# Patient Record
Sex: Female | Born: 1940 | Race: White | Hispanic: No | Marital: Married | Smoking: Former smoker
Health system: Southern US, Community
[De-identification: ages and names within clinical notes are randomized; demographics above are authoritative.]

## PROBLEM LIST (undated history)

## (undated) DIAGNOSIS — E039 Hypothyroidism, unspecified: Secondary | ICD-10-CM

## (undated) DIAGNOSIS — I350 Nonrheumatic aortic (valve) stenosis: Secondary | ICD-10-CM

## (undated) DIAGNOSIS — M1990 Unspecified osteoarthritis, unspecified site: Secondary | ICD-10-CM

## (undated) DIAGNOSIS — F32A Depression, unspecified: Secondary | ICD-10-CM

## (undated) DIAGNOSIS — F329 Major depressive disorder, single episode, unspecified: Secondary | ICD-10-CM

## (undated) DIAGNOSIS — D649 Anemia, unspecified: Secondary | ICD-10-CM

## (undated) DIAGNOSIS — I10 Essential (primary) hypertension: Secondary | ICD-10-CM

## (undated) DIAGNOSIS — R011 Cardiac murmur, unspecified: Secondary | ICD-10-CM

## (undated) DIAGNOSIS — E785 Hyperlipidemia, unspecified: Secondary | ICD-10-CM

## (undated) DIAGNOSIS — C519 Malignant neoplasm of vulva, unspecified: Secondary | ICD-10-CM

## (undated) DIAGNOSIS — E119 Type 2 diabetes mellitus without complications: Secondary | ICD-10-CM

## (undated) DIAGNOSIS — H548 Legal blindness, as defined in USA: Secondary | ICD-10-CM

## (undated) DIAGNOSIS — G459 Transient cerebral ischemic attack, unspecified: Secondary | ICD-10-CM

## (undated) DIAGNOSIS — R4182 Altered mental status, unspecified: Secondary | ICD-10-CM

## (undated) DIAGNOSIS — K219 Gastro-esophageal reflux disease without esophagitis: Secondary | ICD-10-CM

## (undated) HISTORY — DX: Nonrheumatic aortic (valve) stenosis: I35.0

## (undated) HISTORY — DX: Hyperlipidemia, unspecified: E78.5

---
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
## 1969-06-12 HISTORY — PX: TUBAL LIGATION: SHX77

## 1989-06-12 DIAGNOSIS — G459 Transient cerebral ischemic attack, unspecified: Secondary | ICD-10-CM

## 1989-06-12 HISTORY — PX: VULVA SURGERY: SHX837

## 1989-06-12 HISTORY — DX: Transient cerebral ischemic attack, unspecified: G45.9

## 1993-01-30 DIAGNOSIS — E1139 Type 2 diabetes mellitus with other diabetic ophthalmic complication: Secondary | ICD-10-CM

## 1993-01-30 DIAGNOSIS — E1165 Type 2 diabetes mellitus with hyperglycemia: Secondary | ICD-10-CM

## 1997-10-12 HISTORY — PX: FEMUR FRACTURE SURGERY: SHX633

## 1998-10-03 ENCOUNTER — Inpatient Hospital Stay (HOSPITAL_COMMUNITY): Admission: EM | Admit: 1998-10-03 | Discharge: 1998-10-07 | Payer: Self-pay | Admitting: Emergency Medicine

## 1998-10-07 ENCOUNTER — Inpatient Hospital Stay (HOSPITAL_COMMUNITY)
Admission: RE | Admit: 1998-10-07 | Discharge: 1998-10-18 | Payer: Self-pay | Admitting: Physical Medicine and Rehabilitation

## 1998-10-15 ENCOUNTER — Encounter: Payer: Self-pay | Admitting: Physical Medicine and Rehabilitation

## 1998-11-25 ENCOUNTER — Encounter: Payer: Self-pay | Admitting: Orthopedic Surgery

## 1998-11-25 ENCOUNTER — Inpatient Hospital Stay (HOSPITAL_COMMUNITY): Admission: RE | Admit: 1998-11-25 | Discharge: 1998-11-27 | Payer: Self-pay | Admitting: Orthopedic Surgery

## 1999-10-15 ENCOUNTER — Other Ambulatory Visit: Admission: RE | Admit: 1999-10-15 | Discharge: 1999-10-15 | Payer: Self-pay | Admitting: Family Medicine

## 1999-11-14 ENCOUNTER — Encounter: Payer: Self-pay | Admitting: Family Medicine

## 1999-11-14 ENCOUNTER — Encounter: Admission: RE | Admit: 1999-11-14 | Discharge: 1999-11-14 | Payer: Self-pay | Admitting: Family Medicine

## 2000-06-01 ENCOUNTER — Ambulatory Visit (HOSPITAL_BASED_OUTPATIENT_CLINIC_OR_DEPARTMENT_OTHER): Admission: RE | Admit: 2000-06-01 | Discharge: 2000-06-01 | Payer: Self-pay | Admitting: Orthopedic Surgery

## 2000-11-03 ENCOUNTER — Other Ambulatory Visit: Admission: RE | Admit: 2000-11-03 | Discharge: 2000-11-03 | Payer: Self-pay | Admitting: Family Medicine

## 2000-11-12 ENCOUNTER — Other Ambulatory Visit: Admission: RE | Admit: 2000-11-12 | Discharge: 2000-11-12 | Payer: Self-pay | Admitting: Obstetrics & Gynecology

## 2000-11-12 ENCOUNTER — Encounter (INDEPENDENT_AMBULATORY_CARE_PROVIDER_SITE_OTHER): Payer: Self-pay | Admitting: Specialist

## 2000-11-15 ENCOUNTER — Encounter: Payer: Self-pay | Admitting: Family Medicine

## 2000-11-15 ENCOUNTER — Encounter: Admission: RE | Admit: 2000-11-15 | Discharge: 2000-11-15 | Payer: Self-pay | Admitting: Family Medicine

## 2000-11-23 ENCOUNTER — Encounter: Payer: Self-pay | Admitting: Obstetrics & Gynecology

## 2000-11-24 ENCOUNTER — Observation Stay (HOSPITAL_COMMUNITY): Admission: RE | Admit: 2000-11-24 | Discharge: 2000-11-25 | Payer: Self-pay | Admitting: Obstetrics & Gynecology

## 2000-11-24 ENCOUNTER — Encounter (INDEPENDENT_AMBULATORY_CARE_PROVIDER_SITE_OTHER): Payer: Self-pay

## 2000-12-20 ENCOUNTER — Ambulatory Visit (HOSPITAL_COMMUNITY): Admission: RE | Admit: 2000-12-20 | Discharge: 2000-12-20 | Payer: Self-pay | Admitting: Ophthalmology

## 2000-12-20 ENCOUNTER — Encounter: Payer: Self-pay | Admitting: Ophthalmology

## 2001-01-10 ENCOUNTER — Ambulatory Visit (HOSPITAL_COMMUNITY): Admission: RE | Admit: 2001-01-10 | Discharge: 2001-01-11 | Payer: Self-pay | Admitting: Ophthalmology

## 2001-01-12 ENCOUNTER — Ambulatory Visit: Admission: RE | Admit: 2001-01-12 | Discharge: 2001-01-12 | Payer: Self-pay | Admitting: Gynecology

## 2001-02-09 ENCOUNTER — Encounter (INDEPENDENT_AMBULATORY_CARE_PROVIDER_SITE_OTHER): Payer: Self-pay | Admitting: Specialist

## 2001-02-09 ENCOUNTER — Observation Stay (HOSPITAL_COMMUNITY): Admission: RE | Admit: 2001-02-09 | Discharge: 2001-02-10 | Payer: Self-pay | Admitting: Gynecology

## 2001-03-22 ENCOUNTER — Ambulatory Visit: Admission: RE | Admit: 2001-03-22 | Discharge: 2001-03-22 | Payer: Self-pay | Admitting: Gynecology

## 2001-05-09 ENCOUNTER — Ambulatory Visit (HOSPITAL_COMMUNITY): Admission: RE | Admit: 2001-05-09 | Discharge: 2001-05-09 | Payer: Self-pay | Admitting: Ophthalmology

## 2001-06-01 ENCOUNTER — Ambulatory Visit: Admission: RE | Admit: 2001-06-01 | Discharge: 2001-06-01 | Payer: Self-pay | Admitting: Gynecologic Oncology

## 2001-09-06 ENCOUNTER — Ambulatory Visit (HOSPITAL_COMMUNITY): Admission: RE | Admit: 2001-09-06 | Discharge: 2001-09-06 | Payer: Self-pay | Admitting: Internal Medicine

## 2001-09-06 ENCOUNTER — Encounter (INDEPENDENT_AMBULATORY_CARE_PROVIDER_SITE_OTHER): Payer: Self-pay

## 2001-11-23 ENCOUNTER — Ambulatory Visit: Admission: RE | Admit: 2001-11-23 | Discharge: 2001-11-23 | Payer: Self-pay | Admitting: Gynecology

## 2001-12-13 ENCOUNTER — Encounter: Admission: RE | Admit: 2001-12-13 | Discharge: 2001-12-13 | Payer: Self-pay | Admitting: Family Medicine

## 2001-12-13 ENCOUNTER — Encounter: Payer: Self-pay | Admitting: Family Medicine

## 2002-02-22 ENCOUNTER — Other Ambulatory Visit: Admission: RE | Admit: 2002-02-22 | Discharge: 2002-02-22 | Payer: Self-pay | Admitting: Obstetrics & Gynecology

## 2002-08-30 ENCOUNTER — Ambulatory Visit: Admission: RE | Admit: 2002-08-30 | Discharge: 2002-08-30 | Payer: Self-pay | Admitting: Gynecology

## 2002-09-04 ENCOUNTER — Encounter: Admission: RE | Admit: 2002-09-04 | Discharge: 2002-09-04 | Payer: Self-pay | Admitting: Family Medicine

## 2002-09-04 ENCOUNTER — Encounter: Payer: Self-pay | Admitting: Family Medicine

## 2002-12-20 ENCOUNTER — Encounter: Admission: RE | Admit: 2002-12-20 | Discharge: 2003-03-20 | Payer: Self-pay | Admitting: Endocrinology

## 2003-04-17 ENCOUNTER — Encounter: Admission: RE | Admit: 2003-04-17 | Discharge: 2003-04-17 | Payer: Self-pay | Admitting: Family Medicine

## 2003-04-17 ENCOUNTER — Encounter: Payer: Self-pay | Admitting: Family Medicine

## 2003-04-19 ENCOUNTER — Other Ambulatory Visit: Admission: RE | Admit: 2003-04-19 | Discharge: 2003-04-19 | Payer: Self-pay | Admitting: Obstetrics & Gynecology

## 2003-08-15 ENCOUNTER — Ambulatory Visit: Admission: RE | Admit: 2003-08-15 | Discharge: 2003-08-15 | Payer: Self-pay | Admitting: Gynecology

## 2004-08-18 ENCOUNTER — Ambulatory Visit: Payer: Self-pay | Admitting: Family Medicine

## 2004-09-02 ENCOUNTER — Encounter: Admission: RE | Admit: 2004-09-02 | Discharge: 2004-09-02 | Payer: Self-pay | Admitting: Obstetrics & Gynecology

## 2004-11-18 ENCOUNTER — Ambulatory Visit: Payer: Self-pay | Admitting: Family Medicine

## 2004-12-16 ENCOUNTER — Ambulatory Visit: Payer: Self-pay | Admitting: Family Medicine

## 2005-08-03 ENCOUNTER — Ambulatory Visit: Payer: Self-pay | Admitting: Family Medicine

## 2005-08-12 ENCOUNTER — Encounter: Payer: Self-pay | Admitting: Family Medicine

## 2005-08-12 LAB — CONVERTED CEMR LAB

## 2005-11-03 ENCOUNTER — Encounter: Admission: RE | Admit: 2005-11-03 | Discharge: 2005-11-03 | Payer: Self-pay | Admitting: Family Medicine

## 2005-11-23 ENCOUNTER — Encounter: Admission: RE | Admit: 2005-11-23 | Discharge: 2005-11-23 | Payer: Self-pay | Admitting: Family Medicine

## 2006-02-10 ENCOUNTER — Encounter: Payer: Self-pay | Admitting: Orthopedic Surgery

## 2006-02-10 ENCOUNTER — Encounter: Payer: Self-pay | Admitting: Emergency Medicine

## 2006-02-23 ENCOUNTER — Ambulatory Visit: Payer: Self-pay | Admitting: Family Medicine

## 2006-03-30 ENCOUNTER — Ambulatory Visit: Payer: Self-pay | Admitting: Family Medicine

## 2006-04-05 ENCOUNTER — Ambulatory Visit: Payer: Self-pay | Admitting: Cardiology

## 2006-04-13 ENCOUNTER — Ambulatory Visit: Payer: Self-pay | Admitting: Family Medicine

## 2006-05-24 ENCOUNTER — Ambulatory Visit: Payer: Self-pay | Admitting: Family Medicine

## 2006-06-16 ENCOUNTER — Encounter: Payer: Self-pay | Admitting: Family Medicine

## 2006-06-16 LAB — CONVERTED CEMR LAB: Hgb A1c MFr Bld: 7.6 %

## 2006-07-19 ENCOUNTER — Ambulatory Visit: Payer: Self-pay | Admitting: Family Medicine

## 2006-08-09 ENCOUNTER — Ambulatory Visit: Payer: Self-pay | Admitting: Family Medicine

## 2006-09-10 ENCOUNTER — Ambulatory Visit: Payer: Self-pay | Admitting: Family Medicine

## 2006-10-19 ENCOUNTER — Ambulatory Visit: Payer: Self-pay | Admitting: Family Medicine

## 2006-10-19 LAB — CONVERTED CEMR LAB: TSH: 3.38 microintl units/mL

## 2006-12-29 ENCOUNTER — Encounter: Admission: RE | Admit: 2006-12-29 | Discharge: 2006-12-29 | Payer: Self-pay | Admitting: Family Medicine

## 2007-01-31 ENCOUNTER — Encounter: Payer: Self-pay | Admitting: Family Medicine

## 2007-01-31 DIAGNOSIS — E7800 Pure hypercholesterolemia, unspecified: Secondary | ICD-10-CM

## 2007-01-31 DIAGNOSIS — E0590 Thyrotoxicosis, unspecified without thyrotoxic crisis or storm: Secondary | ICD-10-CM | POA: Insufficient documentation

## 2007-01-31 DIAGNOSIS — Z8679 Personal history of other diseases of the circulatory system: Secondary | ICD-10-CM | POA: Insufficient documentation

## 2007-01-31 DIAGNOSIS — D509 Iron deficiency anemia, unspecified: Secondary | ICD-10-CM | POA: Insufficient documentation

## 2007-01-31 DIAGNOSIS — I10 Essential (primary) hypertension: Secondary | ICD-10-CM | POA: Insufficient documentation

## 2007-01-31 DIAGNOSIS — F411 Generalized anxiety disorder: Secondary | ICD-10-CM | POA: Insufficient documentation

## 2007-01-31 DIAGNOSIS — F329 Major depressive disorder, single episode, unspecified: Secondary | ICD-10-CM | POA: Insufficient documentation

## 2007-01-31 DIAGNOSIS — E1139 Type 2 diabetes mellitus with other diabetic ophthalmic complication: Secondary | ICD-10-CM | POA: Insufficient documentation

## 2007-04-14 ENCOUNTER — Encounter: Payer: Self-pay | Admitting: Family Medicine

## 2007-04-20 ENCOUNTER — Ambulatory Visit: Payer: Self-pay | Admitting: Family Medicine

## 2007-04-20 DIAGNOSIS — R413 Other amnesia: Secondary | ICD-10-CM

## 2007-04-20 DIAGNOSIS — E039 Hypothyroidism, unspecified: Secondary | ICD-10-CM

## 2007-08-12 ENCOUNTER — Ambulatory Visit: Payer: Self-pay | Admitting: Family Medicine

## 2007-10-10 ENCOUNTER — Telehealth: Payer: Self-pay | Admitting: Family Medicine

## 2007-11-28 ENCOUNTER — Encounter (INDEPENDENT_AMBULATORY_CARE_PROVIDER_SITE_OTHER): Payer: Self-pay | Admitting: *Deleted

## 2009-03-13 ENCOUNTER — Encounter (INDEPENDENT_AMBULATORY_CARE_PROVIDER_SITE_OTHER): Payer: Self-pay | Admitting: *Deleted

## 2009-08-06 ENCOUNTER — Encounter: Admission: RE | Admit: 2009-08-06 | Discharge: 2009-08-06 | Payer: Self-pay | Admitting: Endocrinology

## 2009-11-05 ENCOUNTER — Telehealth: Payer: Self-pay | Admitting: Internal Medicine

## 2010-09-12 ENCOUNTER — Encounter: Admission: RE | Admit: 2010-09-12 | Discharge: 2010-09-12 | Payer: Self-pay | Admitting: Endocrinology

## 2010-11-02 ENCOUNTER — Encounter: Payer: Self-pay | Admitting: Family Medicine

## 2010-11-11 NOTE — Progress Notes (Signed)
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 Pt states she needs to speak with her daughter and she will call back to schedule. I informed her the importance of scheduling the colonoscopy. Pt verbalized understanding.  Initial call taken by: Christie Nottingham CMA Duncan Dull),  November 05, 2009 1:44 PM

## 2011-02-27 NOTE — Op Note (Signed)
[facility]  Patient:    Regina [name], Regina [name]                           MRN: [id number] Proc. Date: 11/24/00 Attending:  Genia Del, M.D.                           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 Abnormal vessel with acetowhite lesion on the cervix at 5 oclock.  INTERVENTION:                 Colposcopy with acetic acid. Cervical biopsy at 5 clock. Wide local excision of left vulvar lesions.  SURGEON:                      Genia Del, M.D.  ANESTHESIA:                   General, Dr. Pamalee Leyden.  ESTIMATED BLOOD LOSS:         100 cc.  DESCRIPTION OF PROCEDURE:     Under general anesthesia  with endotracheal intubation, the patient is placed in the lithotomy position. She is draped as usual. We started with a colposcopy applying acetic acid on the vulva, vagina, and cervix. The speculum is introduced for that part. On the colposcopy the cervix was visualized entirely. The cervix is vascular and an abnormal vessel at 5 oclock and a biopsy is taken in this area, which is slightly acetowhite. the speculum is removed. No other lesions are seen on the vulva. We, therefore, prepped with Betadine under the vaginal, vulvar, and suprapubic area. We then proceeded with a marker all around the left vulvar lesions including a margin of healthy tissue, which is about 1 cm at the inner margin and upper margin and about 1.5 cm on the outer lower margin. The upper margin includes the labia minora, and the urethra is at a safe distance from the inner upper margins. The diameter of the lesion is about 2 cm. The lesion is elevated has abnormal vessels and appears ulcerated at the same time. We proceeded with the scalpel, and controlling hemostasis with the electrocautery, the lesion is removed deeply including  the subcutaneous adipose tissue in the specimen. We marked the location of the upper labia minora on the specimen of the outer and inner aspect of the specimen and sent to pathology. We then pursued hemostasis with the electrocautery and complete hemostasis with also Vicryl 2-0 in the medial lower border of the wound. Once hemostasis was completed, we reapproximated the subcutaneous tissue with Vicryl 2-0 interrupted stitches. We then closed the skin with a running suture of Vicryl 3-0 which is locked for better hemostasis. We then infiltrated subcutaneous tissue with 7 cc of 0.25% Marcaine plain. We then confirmed good hemostasis. The estimated blood loss was 100 cc. No complication occurred and the patient was transferred to the recovery room in good condition. DD:  11/24/00 TD:  11/25/00 Job: [id number]

## 2011-02-27 NOTE — Op Note (Signed)
[place]  Patient:    Regina [name], Regina [name]                         MRN: [id number] Adm. Date:  [id number] Disc. Date: [id number] Attending:  Jeannette Corpus                           Operative Report  PREOPERATIVE DIAGNOSIS: 1. Clinically significant macular edema of the left eye, resistant to focal    laser photocoagulation and scatter photocoagulation. 2. 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 3. 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 4. Dense nuclear sclerotic cataract of the left eye.  PROCEDURES: 1. Posterior vitrectomy with membrane peel -- internal limiting membrane peel. 2. Endolaser panretinal photocoagulation. 3. Phacofragmentation via pars plana and removal of cataract, extracapsular,    with insertion of posterior chamber intraocular lens -- primary, left eye,    model EZE-60, diopter power is +20.5.  ANESTHESIA:  Local retrobulbar with monitored anesthesia control.  SURGEON:  Ernesto Rutherford, M.D.  INDICATION FOR PROCEDURE:  Patient is a 70 year old woman with progressive vision loss in the left eye, relentless, with extensive nonperfusion, macular nonperfusion, persistent cystoid form of macular edema, and diabetic macular edema despite panretinal photocoagulation.  This is an attempt to remove the vitreous scaffold and possibly the internal limiting membrane in order to allow for vitreous traction to be released and maculopathy to stabilize and macular edema to improve.  She understands the risks of anesthesia, including rare occurrence of death, and loss to the eye, including hemorrhage, infection, scarring, need for further surgery, no change in vision, loss of vision, progressive disease despite intervention.  Appropriate signed consent was obtained.  DESCRIPTION OF PROCEDURE:  The patient was taken to the operating room.  In the operating room, appropriate monitoring, followed by  mild sedation. Marcaine 0.75% delivered, 5 cc retrobulbar, additional 5 cc laterally in the fashion of modified Darel Hong.  Left periocular region prepped and draped in the usual ophthalmic fashion.  Lid speculum applied.  Conjunctival peritomy fashioned superiorly and inferotemporally.  A 4 mm infusion was then secured 3.5 mm posterior to the limbus in the superotemporal quadrant.  Placement in the vitreous cavity verified visually.  Superior sclerotomies were then fashioned.  The limbal wound was then grooved with a crescent blade.  Core vitrectomy was then begun.  It was necessary to perform cataract extraction, and this was done by opening the posterior capsule and then hydrodelineation and delamination with dissection with BSS in the capsular bag. Phacofragmentation was then carried out in the bag in an endocapsular fashion. Cortical cleanup was also carried out in endocapsular fashion.  The anterior chamber maintained its depth throughout.  At this time, the core vitrectomy could be completed.  Iatrogenic posterior vitreous detachment could be completed.  Internal limiting membrane was grasped and peeled off the macular foveal region without difficulty.  Endolaser photocoagulation was then placed peripherally and within the posterior pole carefully to avoid dimpling of the paramacular region.  At this time, the anterior chamber was then deepened with Provisc.  The limbal wound was opened with MVR blade.  Under low infusion, the EZE lens was placed into the sulcus and rotated to the horizontal meridian. Excellent capsular support was verified.  Central posterior capsule opening was made surgically.  The limbal wound was closed with interrupted 10-0 nylon sutures.  The Provisc was aspirated.  Superior sclerotomies then closed with 7-0 Vicryl suture.  The infusion was removed and closed with 7-0 Vicryl suture.  Sterile patch and Fox shield applied after subconjunctival injection of  antibiotics was applied.  The patient tolerated the procedure well without complication. DD:  01/14/01 TD:  01/15/01 Job: [id number]

## 2011-02-27 NOTE — Consult Note (Signed)
[facility]  Patient:    Regina [name], Regina [name]                         MRN: [id number] Proc. Date: 01/12/01 Adm. Date:  [id number] Disc. Date: [id number] Attending:  Ernesto Rutherford CC:         Genia Del, M.D.  Roxy Manns, M.D. [facility]  Telford Nab, R.N.   Consultation Report  HISTORY OF PRESENT ILLNESS:  [age]-year-old white female referred by Dr. Genia Del for evaluation and management of a newly diagnosed vulvar carcinoma.  Patient underwent wide local excision of a vulvar lesion on February 13th.  Final pathology showed this to be a moderately differentiated squamous cell carcinoma invading 3.5 mm.  All surgical margins are free of disease.  The patient has subsequently seen Dr. Hazle Coca at [facility] but because he is not in her insurance plan, presents for consultation with [place].  The patient has no other significant gynecologic history.  PAST MEDICAL HISTORY:  Diabetes with severe retinopathy, hypertension, hypercholesterolemia, osteopenia and depression.  PAST SURGICAL HISTORY:  Cesarean section, bilateral tubal ligation, knee surgery on her distal femur on two occasions, retinopathy, laser surgery most recently this week.  CURRENT MEDICATIONS:  1. Fosamax.  2. Humulin N insulin 95 units every a.m., 85 units in the p.m.  3. Zantac 150 mg one in the morning and one in the evening.  4. Glucophage 500 mg one in the morning and one in the evening.  5. Avandia 8 mg q.d.  6. Demadex 10 mg daily.  7. Calcium.  8. Atenolol 100 mg daily.  9. Lotensin 40 mg daily. 10. Diazepam one tablet in the morning and one in the evening p.r.n. 11. Lipitor 40 mg at bedtime.  DRUG ALLERGIES:  CODEINE, AMOXICILLIN and SULFA.  SOCIAL HISTORY:  The patient does not smoke or drink.  She is a retired [employer] operational officer.  REVIEW OF SYSTEMS:  Review of systems reveals the patient recently had eye surgery and is wearing sunglasses  because of photosensitivity.  PHYSICAL EXAMINATION:  VITAL SIGNS:  Height 5 feet 2 inches.  Weight 223 pounds.  GENERAL:  The patient is a pleasant white female in no acute distress, wearing sunglasses secondary to recent eye surgery.  HEENT:  Negative.  NECK:  Neck is supple without thyromegaly.  NODES:  There is no supraclavicular or inguinal adenopathy.  ABDOMEN:  The abdomen is obese, soft, nontender.  No mass, organomegaly, ascites or herniae are noted.  Careful palpation of the inguinal regions reveals 2+ femoral pulses.  I do not feel any adenopathy.  PELVIC:  EG/BUS shows that the wide local excision in the left vulva is granulating.  There are no lesions noted.  The vagina is otherwise clean and no lesions are noted.  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  I would recommend that the patient undergo a superficial inguinal lymphadenectomy with frozen section (left).  If she has positive nodes, we would proceed with a complete inguinofemoral lymphadenectomy followed by radiation therapy.  The risks of surgery were outlined to the patient and her sister who accompanies her today.  I particularly emphasized the fact that given her diabetes and obesity, wound healing, wound infection, cellulitis, lymphocyst, lymphedema and lymphangitis were all at increased risk of happening following surgery.  The patient wishes to proceed with surgery, which will be scheduled in the  near future. DD:  01/12/01 TD:  01/13/01 Job: [id number]

## 2011-02-27 NOTE — Consult Note (Signed)
[facility]  Patient:    Regina [name], Regina [name] Visit Number: [id number] MRN: [id number]          Service Type: GON Location: GYN Attending Physician:  Sabino Donovan Proc. Date: 06/01/01 Adm. Date:  06/01/2001   CC:         Genia Del, M.D.  Roxy Manns, M.D. [facility]  Telford Nab, R.N.   Consultation Report  Ms. Quesenberry is a drop-in for evaluation of vulvar and gluteal cellulitis following radical vulvectomy.  INTERVAL HISTORY:  The patient was last seen in June and, at that time, was doing well.  She had evaluation of her vulvar incision by Telford Nab at the end of last week, without evidence of any erythema.  On Sunday, she developed shaking chills and fever.  She was evaluated by Dr. Emogene Morgan practice on Monday and started on Biaxin for clinically apparent cellulitis. The patient relates no further fevers and states that the erythema has markedly improved.  She denies urinary tract symptoms or symptoms of yeast vulvovaginitis.  She has had no leg swelling.  HISTORY OF PRESENT ILLNESS:  The patient underwent left inguinal lymphadenectomy and resection of stage II carcinoma of the vulva in May 2002. She had no evidence of metastatic disease and has been followed without evidence of recurrence.  She was treated for clinical cellulitis a couple of months ago and has had problems with yeast vulvitis in the past.  PAST MEDICAL HISTORY:  Diabetes with severe retinopathy, hypertension, hypercholesterolemia, osteoporosis, and depression.  PAST SURGICAL HISTORY:  Cesarean section, tubal ligation, knee surgery, retinopathy treated with laser surgery, and vulvar cancer as above.  MEDICATIONS:  Fosamax, Humulin, Zantac, Glucophage, Avandia, Demadex, calcium, atenolol, Lotensin, diazepam, and Lipitor.  ALLERGIES:  CODEINE, AMOXICILLIN, SULFA.  Otherwise, past history, personal and social history, and review of systems are as included in our  intake evaluation.  PHYSICAL EXAMINATION:  VITAL SIGNS:  Stable and afebrile.  GENERAL:  The patient is obese, alert and oriented x 3, and in no acute distress.  ABDOMEN:  Soft and benign.  BACK:  There is no back or CVA tenderness.  EXTREMITIES:  Full strength and range of motion without edema.   There is no evidence of leg cellulitis or lymphocyst.  Inspection the gluteal region reveals a left gluteal region of erythema and subcutaneous edema.  There is a small patch of erythema anteriorly on the mons.  PELVIC/COLON:  Mons erythema is as noted above.  There is no evidence of folliculitis or abscess of the vulva.  Surgical incisions are well healed. The vagina is clear.  Bimanual examination is deferred.  ASSESSMENT:  Recurrent vulvar/gluteal cellulitis following surgery for stage II cancer of the vulva; no evidence of recurrent vulvar cancer.  PLAN:  In a discussion with the patient and family regarding disease process and etiology of her recurrent episodes of cellulitis, which I believe related to lymphocytosis in the mons and gluteal regions after removal of the inguinal nodes.  The patient should complete her course of Biaxin.  She will use Monistat if needed.  She is given a prescription for Keflex 250 mg q.i.d. x 10 days which she should start promptly at the first sign of recurrent cellulitis.  We would see her in the course of recurrent flare if she had evidence of worsening cellulitis despite 24 hours of antibiotics including worsening fever and expanding erythema.  Otherwise, the patient will be seen by Dr. Seymour Bars in a few months, as previously scheduled  and will return to see Dr. Stanford Breed in February. Attending Physician:  Ronita Hipps T DD:  06/01/01 TD:  06/01/01 Job: [id number]

## 2011-02-27 NOTE — Procedures (Signed)
[facility]  Patient:    Regina [name], Regina [name] Visit Number: [id number] MRN: [id number]          Service Type: END Location: ENDO Attending Physician:  Mervin Hack Dictated by:   Hedwig Morton. Juanda Chance, M.D. [facility] Admit Date:  09/06/2001   CC:         Roxy Manns, M.D. [facility]                           Procedure Report  PROCEDURE:  Upper endoscopy and colonoscopy.  INDICATIONS:  This 70 year old white female diabetic, on multiple medications, was found to be anemic, hematocrit of 30, low MCV of 74, but her stools were negative for blood.  She is postmenopausal.  Her sister has peptic ulcer disease.  The patient has been on iron supplement.  On my exam on August 17, 2001, her stool was Hemoccult negative.  She is undergoing upper as well as lower endoscopy for evaluation of possible GI blood loss.  INSTRUMENT:  Olympus single-channel videoscope.  PREMEDICATION:  Versed 5 mg IV, Demerol 40 mg IV.  DESCRIPTION OF PROCEDURE:  The Olympus single-channel videoscope was passed routinely through the posterior pharynx into the esophagus.  The patient was monitored by pulse oximeter.  Oxygen saturations were normal.  She was cooperative.  The proximal distal esophageal mucosa was unremarkable.  She had a normal squamocolumnar junction, no hiatal hernia, no stricture.  Stomach:  The stomach was insufflated with air and showed essentially normal rugal pattern of gastric folds and gastric antrum.  Pyloric outlet was normal.  Duodenum:  Duodenal bulb and descending duodenum were unremarkable.  Endoscope was then brought back into the stomach, retroflexed, and the fundus and cardia were reviewed from the J position.  The mucosa appeared normal.  There was nothing to account for GI blood loss.  IMPRESSION:  Normal upper endoscopy, esophagus, stomach, and duodenum.  PLAN:  Colonoscopy.  INSTRUMENT:  Olympus single-channel videoendoscope.  PREMEDICATION:  Additional  Versed 3 mg IV, Demerol 80 mg IV.  DESCRIPTION OF PROCEDURE:  Olympus single-channel videoendoscope passed under direct visualization through rectum into the sigmoid colon.  The patient was again monitored by pulse oximeter.  Her oxygen saturations were normal.  Her rectal tone and anal canal were normal.  Rectal ampulla was also normal.  From the level of 15 to about 20 cm from the rectum there were eight polyps.  One of them was medium-sized, with blood tinges on the markedly lobulated surface of the polyp measuring about 1.5 cm.  There were seven other polyps.  Two of them were ablated without obtaining a tissue, measuring less than 3 mm.  The other five of them were snared with snare, and they were measured between 5-9 mm in diameter.  They were all recovered and sent to pathology in one specimen.  There was no bleeding from postpolypectomy sites.  There were numerous diverticula through the sigmoid colon.  The splenic flexure, transverse colon, hepatic flexure were normal.  No additional polyps were found in the right colon or in the cecum.  Cecal pouch and cecum were normal except for some nonspecific erythema.  We took biopsies of the cecal pouch to rule out collagenous or lymphocytic colitis.  Colonoscope was then retracted, colon decompressed.  The patient tolerated the procedure well.  IMPRESSION: 1. Multiple left colon polyps, status post ______ of snare polypectomies. 2. Mild diverticulosis of the left colon. 3. Status post cecal  biopsies.  PLAN: 1. Postpolypectomy instructions will include avoidance of aspirin and NSAIDs    for two weeks. 2. Recall colonoscopy in two years, depending on the histology of the polyps. 3. Resume medications including iron supplements, and follow H&H closely. Dictated by:   Hedwig Morton. Juanda Chance, M.D. [facility] Attending Physician:  Mervin Hack DD:  09/06/01 TD:  09/06/01 Job: [id number]

## 2011-02-27 NOTE — Consult Note (Signed)
NAME:  Regina [name], Regina [name]                            ACCOUNT NO.:  [id number]   MEDICAL RECORD NO.:  [id number]                   PATIENT TYPE:  OUT   LOCATION:  GYN                                  [facility]AC[facility]   PHYSICIAN:  De Blanch, M.D.         DATE OF BIRTH:  January 24, 1941   DATE OF CONSULTATION:  08/30/2002  DATE OF DISCHARGE:                                   CONSULTATION   A 70 year old white female returns for continuing follow-up of a stage II  vulvar cancer.  She underwent partial radical vulvectomy and left inguinal  lymphadenectomy in May 2002.  She had negative lymph nodes and has had no  postoperative complications.  She saw Dr. Seymour Bars approximately three months  ago.  Since then the only difficulty has been with vaginal intercourse  secondary to pain and discomfort.  She has discussed this with Dr. Seymour Bars  and a question was raised as to whether she might be able to use Premarin  cream.  The patient denies any vaginal bleeding or discharge or any other  symptoms.  She denies any lymphedema.   REVIEW OF SYSTEMS:  No cardiovascular, pulmonary, GI, or GU symptoms.  The  patient is obese.   FAMILY HISTORY:  Reviewed and unchanged.   SOCIAL HISTORY:  Reviewed and unchanged.   PHYSICAL EXAMINATION:  VITAL SIGNS:  Weight 247 pounds, blood pressure  140/84.  GENERAL:  The patient is a healthy, obese white female in no acute distress.  HEENT:  Negative.  NECK:  Supple without thyromegaly.  LYMPH:  There is no supraclavicular or inguinal adenopathy.  Her left  inguinal incision is well healed.  ABDOMEN:  Obese, soft, nontender.  No mass, organomegaly, ascites, or  hernias are noted.  She has a well healed midline incision.  PELVIC:  EGBUS shows the vulvar surgery site to be well healed.  No lesions  are noted on the vulva, perineum, or anus.  Vagina is atrophic and somewhat  stenotic.  Cervix is normal.  Bimanual and rectovaginal examination reveal  no masses,  induration, or nodularity.  Uterus seems to be normal in size.   IMPRESSION:  Stage II vulvar cancer May 2002.  No evidence of recurrent  disease.   Vaginal atrophy and dyspareunia.    PLAN:  The patient is given prescription for Premarin vaginal cream to be  used 1 g at bedtime two times a week.  She is also given samples of  Astroglyde for use as lubrication during sexual intercourse.  She will  return to see Dr. Seymour Bars in six months and return to see [place] in one year.                                               De Blanch, M.D.  DC/MEDQ  D:  08/30/2002  T:  08/30/2002  Job:  [id number]   cc:   Genia Del, M.D.  [address]  [place]  [place] [address]  Fax: [phone]   Idamae Schuller A. Milinda Antis, M.D. [name]   Telford Nab, R.N.  [address] [place], [place] [address]  Fax: 1

## 2011-02-27 NOTE — Consult Note (Signed)
NAME:  Regina [name], Regina [name]                            ACCOUNT NO.:  [id number]   MEDICAL RECORD NO.:  [id number]                   PATIENT TYPE:  OUT   LOCATION:  GYN                                  FACILITY:  [facility]   PHYSICIAN:  De Blanch, M.D.         DATE OF BIRTH:  [birth year]   DATE OF CONSULTATION:  08/15/2003  DATE OF DISCHARGE:                                   CONSULTATION   CHIEF COMPLAINT:  Vulvar cancer.   INTERVAL HISTORY:  Since her last visit, the patient has done well.  She saw  Dr. Seymour Bars in July and had a Pap smear at that time which was normal.  She  denies any new vulvar symptoms.  She is using Premarin cream, but only  applying it externally as she is unable to insert the applicator  comfortably.   She denies any vaginal bleeding and has no vulvar symptoms.   HISTORY OF PRESENT ILLNESS:  The patient underwent a modified radical  vulvectomy and left inguinal lymphadenectomy in May 2002, for a stage II  vulvar cancer.  She had negative nodes and received no postoperative  treatment.   PAST MEDICAL HISTORY:  1. Obesity.  2. Hypertension.  3. Hypercholesterolemia.  4. Osteopenia.  5. Depression.   PAST SURGICAL HISTORY:  1. Cesarean section.  2. Tubal ligation.  3. Knee surgery.  4. Laser surgery for retinopathy.   MEDICATIONS:  1. Fosamax.  2. Humulin.  3. Insulin.  4. Zantac.  5. Glucophage.  6. Avandia.  7. Demadex.  8. Calcium.  9. Atenolol.  10.      Lotensin.  11.      Diazepam.  12.      Lipitor.   DRUG ALLERGIES:  1. CODEINE.  2. AMOXICILLIN.  3. SULFA.   SOCIAL HISTORY:  The patient does not smoke or drink.  She is a retired  [employer] operational officer.  She takes care of her 57-year-old granddaughter daily.   REVIEW OF SYSTEMS:  Negative except as noted above.   PHYSICAL EXAMINATION:  VITAL SIGNS:  Height 5 feet 2 inches, weight 252  pounds, blood pressure 132/80.  GENERAL:  The patient is a pleasant white female in no acute distress.  HEENT:  Negative.  NECK:  Supple without thyromegaly.  There is no supraclavicular or inguinal  adenopathy.  ABDOMEN:  The abdomen is soft, but obese.  Nontender, no mass, organomegaly,  ascites, or hernias are noted.  PELVIC:  EGBUS shows some skin hypopigmentation, no lesions are noted.  Vagina is atrophic, somewhat stenotic at the introitus, but otherwise  normal.  No lesions are noted.  Bimanual and rectovaginal exam reveal no  masses, induration, or nodularity.   IMPRESSION:  Stage II vulvar cancer.  Now with 2-1/2 years of followup.  The  patient is encouraged to see Dr. Seymour Bars in six months, and return to see [place]  in one year.  De Blanch, M.D.    DC/MEDQ  D:  08/15/2003  T:  08/15/2003  Job:  [id number]   cc:   Genia Del, M.D.  [address]  [place]  [place] [address]  Fax: [phone]   Idamae Schuller A. Milinda Antis, M.D. [facility]   Telford Nab, R.N.  [address]  [place], [place] [address]

## 2011-02-27 NOTE — Op Note (Signed)
[facility]  Patient:    Regina [name], Regina [name]                         MRN: [id number] Proc. Date: 02/09/01 Adm. Date:  [id number] Attending:  Jeannette Corpus CC:         Genia Del, M.D.  Telford Nab, R.N.   Operative Report  PREOPERATIVE DIAGNOSIS:  Stage 1 vulvar cancer.  POSTOPERATIVE DIAGNOSIS:  Stage 1 vulvar cancer.  PROCEDURE:  Left inguinal lymphadenectomy.  SURGEON:  Daniel L. Clarke-Pearson, M.D.  ASSISTANT:  Telford Nab, R.N.  ANESTHESIA:  General with oral tracheal tube.  ESTIMATED BLOOD LOSS:  50 cc.  SURGICAL FINDINGS:  There were no enlarged inguinal lymph nodes. The vulvar examination was normal with no evidence of residual disease on the vulva.  DESCRIPTION OF PROCEDURE:  The patient was brought to the operating room and after satisfactory attainment of general anesthesia was placed on the operating table in the supine position. The anterior abdominal wall and thigh and groin on the left side were prepped with Betadine and draped. The patient was placed in the steep Trendelenburg position in order to mobilize her large panniculus off of her inguinal region. An incision was made parallel to the inguinal ligament and carried down to the Campers fascia. Campers fascia was undermined both cephalad and caudad. The inguinal ligament was identified and cleaned off throughout its length. The femoral artery was palpated. The dissection was carried medially identifying the adductor longus muscle. Dissection was carried laterally identifying the fascia of the tensor fascia lata muscle. A plane was created above the ______ fascia and sharp and blunt dissection was used. Vascular pedicles were cross clamped and suture or free tied using 2-0 Vicryl suture. In a progressive fashion dissecting from left to right, the superficial inguinal lymph nodes were excised in their entirety. Hemostasis was achieved with cautery and  additional sutures. The patient was placed in reverse Trendelenburg position so that we could evaluate the location of her abdominal wall in relationship to the inguinal region. A skin incision was made in the lower abdomen and a drain was brought out through a stab incision and sutured to the skin with #0 silk. A Blake drain was placed in the incision in the wound. The subcutaneous tissue was reapproximated with 2-0 Vicryl and the skin closed with a running subcuticular suture of 3-0 Vicryl. Steri-Strips were applied. The patient was awakened from anesthesia and taken to the recovery room in satisfactory condition. Sponge, needle and instrument counts were correct x 2. DD:  02/09/01 TD:  02/10/01 Job: [id number]

## 2011-02-27 NOTE — Op Note (Signed)
[place]  Patient:    Regina [name], Regina [name] Visit Number: [id number] MRN: [id number]          Service Type: GON Location: GYN Attending Physician:  Sabino Donovan Proc. Date: 05/09/01 Adm. Date:  06/01/2001 Disc. Date: 06/01/2001                             Operative Report  PREOPERATIVE DIAGNOSES: 1. Proliferative diabetic retinopathy, right eye, progressive despite previous    panphotocoagulation. 2. Dense nuclear sclerotic cataract of the right eye. 3. Profound multifocal diffuse clinically significant macular edema, not    responsive to previous focal treatments.  PROCEDURES: 1. Posterior vitrectomy with Endolaser panretinal photocoagulation,    right eye. 2. Pars plana phacofragmentation lensectomy of the right eye. 3. Insertion of posterior chamber intraocular lens into the sulcus, model    EZE-60, power +20.5, serial number [id number].  ANESTHESIA:  Local retrobulbar with monitored anesthesia control.  INDICATION FOR PROCEDURE:  The patient is a 70 year old woman with profound progressive vision loss on the basis of capillary closure, progressive proliferative diabetic retinopathy, active macular edema not responsive to aggressive surgical treatment in the office setting with laser photocoagulation.  She also has a nuclear sclerotic cataract.  This is an attempt to enhance oxygenation by removing the hyaloid face as well as to deliver further panretinal photocoagulation.  She understands the best chance of performance of complete vitrectomy in this case is to remove the cataract as well as to place intraocular lens placement so that the anterior hyaloid can also be removed.  The patient understands the risks of anesthesia, including the rare occurrence of death, and also the risks to the eye, including hemorrhage, infection, scarring, need for further surgery, no change in vision, loss of vision, and progression of disease despite  intervention. Appropriate signed consent was obtained.  DESCRIPTION OF PROCEDURE:  The patient was taken to the operating room.  In the operating room, appropriate monitoring followed by mild sedation. Marcaine 0.75% delivered 5 cc retrobulbar to the right eye, followed by an additional 5 cc laterally in the fashion of modified Darel Hong.  Right periocular region was sterilely prepped and draped in the usual ophthalmic fashion.  Lid speculum applied.  Conjunctival peritomy fashioned temporally and superonasally.  A grooved limbal incision was then fashioned superiorly. A 4 mm infusion was secured 3.5 mm posterior to the limbus in the inferotemporal quadrant.  Placement in the vitreous cavity verified visually. Superior sclerotomies were then fashioned.  Wall microscope placed in position with BIOM attachment.  Core vitrectomy was then begun.  Posterior capsular opening was then created.  Hydrodelineation and delamination were then carried out in the capsular bag.  Phacofragmentation in the endocapsular technique was then carried out.  Cortical cleanup was uneventful.  At this time complete vitrectomy could be completed.  Scleral depression was then used to engage the vitreous skirt.  Iatrogenic posterior vitreous detachment was fashioned by active suction nasal to the optic nerve.  Neovascular tissue along the superotemporal vascular arcade was identified, and this was delaminated and vitreous skirt elevated 360 degrees and trimmed 360 degrees.  Endolaser photocoagulation was then carried out 360 degrees.  Thereafter the anterior chamber was deepened with Provisc and the limbal wound was enlarged to accept the intraocular lens to the sulcus.  This was rotated to the horizontal meridian.  Interrupted 10-0 nylon suture was then used to close the superior corneal wound.  The wound was found to be secure.  Provisc was aspirated from the anterior chamber.  A central capsule opening was then  fashioned with vitrectomy instrumentation.  Hemostasis was spontaneous.  Superior sclerotomy closed with 7-0 Vicryl suture.  The infusion was removed and closed with 7-0 Vicryl suture.  Conjunctiva closed with 7-0 Vicryl suture.  Subconjunctival injections of antibiotic and steroid applied.  The patient tolerated the procedure without complication. Attending Physician:  Ronita Hipps T DD:  06/02/01 TD:  06/03/01 Job: [phone]

## 2011-02-27 NOTE — H&P (Signed)
[facility]  Patient:    Regina [name], Regina [name]                         MRN: [id number] Adm. Date:  [id number] Disc. Date: [id number] Attending:  Jeannette Corpus CC:         Genia Del, M.D.  Telford Nab, R.N.   History and Physical  FOLLOWUP NOTE  HISTORY:                      This is a 70 year old white female who returns for postoperative followup having undergone a left inguinal lymphadenectomy on May 1 for completion of staging of a Stage II carcinoma of the vulva. Final pathology showed eight lymph nodes with no evidence of metastatic disease. The patient has had an uncomplicated postoperative course. Currently, she notes no problems in her inguinal region, is not having any evidence of lymphocyst or lymphedema.  PHYSICAL EXAMINATION:         Weight 232 pounds. The abdomen is soft and nontender but obese. The inguinal incision is healing well, no evidence of lymphocyst is noted. She has no lymphedema.  IMPRESSION:                   Stage II vulvar carcinoma.  PLAN:                         With good postoperative recovery, the patient can return to full levels of activity. She will return to see Dr. Seymour Bars in approximately four months for continued followup and return to see me in eight months. DD:  03/22/01 TD:  03/22/01 Job: [id number]

## 2011-02-27 NOTE — Op Note (Signed)
[place]. [facility]  Patient:    Regina [name], Regina [name]                         MRN: [id number] Proc. Date: 06/01/00 Adm. Date:  [id number] Attending:  Twana First                           Operative Report  PREOPERATIVE DIAGNOSES:  1. 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  2. Retained hardware, left distal femur.  POSTOPERATIVE DIAGNOSES:  1. 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  2. Retained hardware, left distal femur.  OPERATION/PROCEDURE:  1. Left knee examined under anesthesia followed by arthroscopic partial     medial meniscectomy.  2. Left knee tricompartmental chondroplasty.  3. Left knee removal of retained hardware, distal femur, and interlocking     femoral screw.  SURGEON: Elana Alm. Thurston Hole, M.D.  ASSISTANT: Kirstin Adelberger, P.A.  ANESTHESIA: General.  OPERATIVE TIME: Forty-five minutes.  COMPLICATIONS: None.  INDICATIONS FOR PROCEDURE: Regina [name] is a 70 year old female who had sustained a left distal femur fracture in 1999 and at that time underwent open reduction and femoral rodding.  Subsequent to this she has had persistent problems with delayed bone healing of the femoral shaft fracture but this has finally healed.  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  She is now to undergo left knee arthroscopy as well as removal of the interlocking screws from the distal femoral rod.  DESCRIPTION OF PROCEDURE: Regina [name] was brought to the operating room on June 01, 2000 and placed on the operating room table in the supine position.  After an adequate level of general anesthesia was obtained the left knee was examined under anesthesia and range of motion was 0-95 degrees, with stable ligamentous examination and with normal  patella tracking.  After this was done the knee was sterilely injected with 0.25% Marcaine with epinephrine.  The left leg was then prepped using sterile Betadine and draped using sterile technique.  Initially arthroscopy was performed through an inferior and lateral portal.  The arthroscope with a pump attached was placed and through an inferior and medial portal the arthroscopic probe was placed.  On initial inspection of the medial compartment she was found to have grade 3 chondromalacia over 75% of the medial femoral condyle and medial tibial plateau, and this was thoroughly debrided.  The medial meniscus showed tearing of the posterior and medial horn, of which 25-30% was resected back to a stable rim.  The intercondylar notch was inspected and anterior and posterior cruciate ligaments were found to be normal.  The lateral compartment was inspected and articular cartilage, lateral femoral condyle, and lateral tibial plateau showed grade 3 chondromalacia over 25%.  This was debrided.  The lateral meniscus was probed and was found to be normal.  The patellofemoral joint was inspected and significant synovitis was noted in the medial gutter. This was thoroughly debrided.  The patellofemoral joint showed grade 3 chondromalacia throughout the patella and femoral groove and this was debrided as well.  The lateral compartment showed significant lateral synovitis and this was also resected and debrided.  Well fixed osteophytes on the femoral condyles and in the intercondylar notch were not removed and they  did not impinge on motion.  After this was done it was felt that no further intra-articular pathology was found.  The arthroscopic instruments were removed and the portals closed with 3-0 Nylon and injected with 0.25% Marcaine with epinephrine.  After this was done through the lateral distal incision initial exposure was made.  A 5 cm incision was made and the underlying subcutaneous tissues  were incised in line with the skin incision.  The iliotibial band was incised longitudinally, revealing the underlying distal interlocking screws, which were sequentially removed without complications, as well as the small washers on the screws as well.  There was bone cement over the distal screw which I had placed previously to hold the screw in position and all the cement was removed as well.  There was no sign of infection and the femoral fracture was found to be well-healed.  Intraoperative fluoroscopy confirmed that the screws had been satisfactorily removed and that the rod was still in good position.  After this was done the wound was irrigated and closed using 0 Vicryl to close the iliotibial band and subcutaneous tissues and skin staples were used on the skin.  The wound was injected with 0.25% Marcaine with epinephrine and sterile dressings were applied.  The patient was awakened and taken to the recovery room in stable condition.  FOLLOW-UP CARE: Regina [name] will be followed as an outpatient on Vicodin for pain.  I will see her back in the office in a week for suture removal and follow-up. DD:  06/01/00 TD:  06/02/00 Job: [id number]

## 2011-02-27 NOTE — Discharge Summary (Signed)
[facility]  Patient:    Regina [name], Regina [name]                         MRN: [id number] Adm. Date:  [id number] Disc. Date: [id number] Attending:  Genia Del                           Discharge Summary  DATE OF BIRTH:                [birth year]  HISTORY:                      The patient was kept under observation overnight at [facility].  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  DISCHARGE DIAGNOSES:          1. 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                               2. Rule out cervical lesion pending pathology                                  report of left vulvar wide excision specimen                                  and cervical biopsy.  HOSPITAL COURSE:              [name] is a 70 year old woman who was brought to the operating room on November 24, 2000 for a left vulvar carcinoma in situ (there is a possibility of microinvasion) per biopsy diagnosis at the office. In the operating room a colposcopy was done with acetic acid and a cervical biopsy was performed at 5 oclock to rule out a dysplasia. Wide local excision of the left vulvar lesion was achieved, at least a 1-cm margin was taken all around, probably more like 1.5 cm on the outer lower margin. The excision biopsy was also done deeply to remove all the subcutaneous fat. Good hemostasis was achieved and the skin was closed. The specimen was sent to pathology. No groin lymph nodes were palpable. The estimated blood loss was 100 cc and no complication occurred. The next morning the patient was seen, had no complaints, no abnormal bleeding, vital signs were stable, and no hematoma and no bleeding were seen on exam at the vulva. She was therefore discharged with postoperative advice.  DISCHARGE MEDICATIONS:        She  was given Darvocet-N 100 p.r.n. and Ibuprofen p.r.n.  DISCHARGE FOLLOWUP:           She will be followed up at [facility] in one to two weeks. She will be called as soon as pathology result is available. D:  12/12/00 TD:  12/12/00 Job: [id number]

## 2011-02-27 NOTE — Consult Note (Signed)
[facility]  Patient:    Regina [name], Regina [name] Visit Number: [id number] MRN: [id number]          Service Type: GON Location: GYN Attending Physician:  Jeannette Corpus Dictated by:   Rande Brunt. Clarke-Pearson, M.D. Proc. Date: 11/23/01 Admit Date:  11/23/2001   CC:         Genia Del, M.D.             Roxy Manns, M.D. [facility]             Telford Nab, R.N.                          Consultation Report  A 70 year old white female returns for continuing follow-up.  She had a stage II vulvar carcinoma treated with partial vulvectomy and left inguinal lymphadenectomy on Feb 09, 2001.  Eight lymph nodes were free of metastatic disease.  The patient required no postoperative therapy.  Since her last visit she has done reasonably well.  She has had one bout of lymphangitis lateral to the inguinal incision on the left side.  She also complains of some increased swelling in her mons.  She denies any fever, chills, or any new vulvar lesions.  REVIEW OF SYSTEMS:  Essentially negative.  FAMILY HISTORY:  Reviewed and unchanged.  SOCIAL HISTORY:  Reviewed and unchanged.  Patient does note that she has had some increased weight gain over the past several months.  PHYSICAL EXAMINATION  VITAL SIGNS:  Weight 240 pounds (this is up nearly 20 pounds since April 2002), blood pressure 144/86.  GENERAL:  The patient is an obese white female in no acute distress.  HEENT:  Negative.  NECK:  Supple without thyromegaly.  NODES:  There is no supraclavicular or inguinal adenopathy.  ABDOMEN:  Obese, soft, nontender.  No mass, organomegaly, ascites, or hernias are noted.  The left inguinal incision is well healed.  There is no lymphocyst and no lymphedema at the present time.  PELVIC:  EGBUS shows some edema of the mons.  The vulva appears normal.  No lesions are noted.  Vagina is clean and no lesions are noted.  Bimanual and rectovaginal examinations reveal  no masses, induration, or nodularity.  IMPRESSION:  Stage II vulvar carcinoma.  No evidence of recurrent disease. The patient is doing well.  Recurrent lymphangitis.  The patient has a prescription for Keflex which she keeps with her and uses that if and when she has a flare.  She will return to see Dr. Genia Del in three months.  That will be a one year visit and thereafter we would plan on seeing her at six month intervals, therefore I will see her in approximately nine months. Dictated by:   Rande Brunt. Clarke-Pearson, M.D. Attending Physician:  Jeannette Corpus DD:  11/23/01 TD:  11/23/01 Job: [id number]

## 2011-05-15 ENCOUNTER — Inpatient Hospital Stay (HOSPITAL_COMMUNITY)
Admission: EM | Admit: 2011-05-15 | Discharge: 2011-05-19 | DRG: 637 | Disposition: A | Discharge status: Home / Self Care | Payer: Medicare Other | Attending: Internal Medicine | Admitting: Internal Medicine

## 2011-05-15 ENCOUNTER — Emergency Department (HOSPITAL_COMMUNITY): Payer: Medicare Other

## 2011-05-15 DIAGNOSIS — F3289 Other specified depressive episodes: Secondary | ICD-10-CM | POA: Diagnosis present

## 2011-05-15 DIAGNOSIS — E039 Hypothyroidism, unspecified: Secondary | ICD-10-CM | POA: Diagnosis present

## 2011-05-15 DIAGNOSIS — Z7982 Long term (current) use of aspirin: Secondary | ICD-10-CM

## 2011-05-15 DIAGNOSIS — E875 Hyperkalemia: Secondary | ICD-10-CM | POA: Diagnosis present

## 2011-05-15 DIAGNOSIS — I10 Essential (primary) hypertension: Secondary | ICD-10-CM | POA: Diagnosis present

## 2011-05-15 DIAGNOSIS — G9341 Metabolic encephalopathy: Secondary | ICD-10-CM | POA: Diagnosis present

## 2011-05-15 DIAGNOSIS — E785 Hyperlipidemia, unspecified: Secondary | ICD-10-CM | POA: Diagnosis present

## 2011-05-15 DIAGNOSIS — N179 Acute kidney failure, unspecified: Secondary | ICD-10-CM | POA: Diagnosis present

## 2011-05-15 DIAGNOSIS — E1010 Type 1 diabetes mellitus with ketoacidosis without coma: Secondary | ICD-10-CM

## 2011-05-15 DIAGNOSIS — E11319 Type 2 diabetes mellitus with unspecified diabetic retinopathy without macular edema: Secondary | ICD-10-CM | POA: Diagnosis present

## 2011-05-15 DIAGNOSIS — H543 Unqualified visual loss, both eyes: Secondary | ICD-10-CM | POA: Diagnosis present

## 2011-05-15 DIAGNOSIS — J069 Acute upper respiratory infection, unspecified: Secondary | ICD-10-CM | POA: Diagnosis present

## 2011-05-15 DIAGNOSIS — D696 Thrombocytopenia, unspecified: Secondary | ICD-10-CM | POA: Diagnosis present

## 2011-05-15 DIAGNOSIS — E1310 Other specified diabetes mellitus with ketoacidosis without coma: Principal | ICD-10-CM | POA: Diagnosis present

## 2011-05-15 DIAGNOSIS — E1139 Type 2 diabetes mellitus with other diabetic ophthalmic complication: Secondary | ICD-10-CM | POA: Diagnosis present

## 2011-05-15 DIAGNOSIS — F329 Major depressive disorder, single episode, unspecified: Secondary | ICD-10-CM | POA: Diagnosis present

## 2011-05-15 DIAGNOSIS — D72829 Elevated white blood cell count, unspecified: Secondary | ICD-10-CM | POA: Diagnosis present

## 2011-05-15 LAB — BASIC METABOLIC PANEL
BUN: 52 mg/dL — ABNORMAL HIGH (ref 6–23)
BUN: 54 mg/dL — ABNORMAL HIGH (ref 6–23)
CO2: 6 mEq/L — CL (ref 19–32)
CO2: 14 mEq/L — ABNORMAL LOW (ref 19–32)
Calcium: 8.8 mg/dL (ref 8.4–10.5)
Chloride: 80 mEq/L — ABNORMAL LOW (ref 96–112)
Chloride: 91 mEq/L — ABNORMAL LOW (ref 96–112)
Creatinine, Ser: 1.68 mg/dL — ABNORMAL HIGH (ref 0.50–1.10)
Creatinine, Ser: 1.65 mg/dL — ABNORMAL HIGH (ref 0.50–1.10)
GFR calc Af Amer: 37 mL/min — ABNORMAL LOW (ref >60)
GFR calc non Af Amer: 30 mL/min — ABNORMAL LOW (ref >60)
GFR calc non Af Amer: 31 mL/min — ABNORMAL LOW (ref >60)
Glucose, Bld: 1048 mg/dL (ref 70–99)
Glucose, Bld: 697 mg/dL (ref 70–99)
Potassium: 4.2 mEq/L (ref 3.5–5.1)
Sodium: 123 mEq/L — ABNORMAL LOW (ref 135–145)
Sodium: 126 mEq/L — ABNORMAL LOW (ref 135–145)
Sodium: 131 mEq/L — ABNORMAL LOW (ref 135–145)

## 2011-05-15 LAB — URINALYSIS, ROUTINE W REFLEX MICROSCOPIC
Ketones, ur: 15 mg/dL — AB
Leukocytes, UA: NEGATIVE
Nitrite: NEGATIVE
Protein, ur: NEGATIVE mg/dL
Urobilinogen, UA: 0.2 mg/dL (ref 0.0–1.0)

## 2011-05-15 LAB — DIFFERENTIAL
Band Neutrophils: 20 % — ABNORMAL HIGH (ref 0–10)
Eosinophils Relative: 0 % (ref 0–5)
Metamyelocytes Relative: 3 %
Myelocytes: 0 %
Neutro Abs: 16.2 10*3/uL — ABNORMAL HIGH (ref 1.7–7.7)
Promyelocytes Absolute: 0 %
nRBC: 0 /100 WBC

## 2011-05-15 LAB — COMPREHENSIVE METABOLIC PANEL
AST: 19 U/L (ref 0–37)
Albumin: 3.1 g/dL — ABNORMAL LOW (ref 3.5–5.2)
Alkaline Phosphatase: 139 U/L — ABNORMAL HIGH (ref 39–117)
CO2: 5 mEq/L — CL (ref 19–32)
Calcium: 9.1 mg/dL (ref 8.4–10.5)
Creatinine, Ser: 1.51 mg/dL — ABNORMAL HIGH (ref 0.50–1.10)
GFR calc Af Amer: 41 mL/min — ABNORMAL LOW (ref >60)
Glucose, Bld: 1084 mg/dL (ref 70–99)
Potassium: 6.8 mEq/L (ref 3.5–5.1)
Total Bilirubin: 0.3 mg/dL (ref 0.3–1.2)
Total Protein: 6.6 g/dL (ref 6.0–8.3)

## 2011-05-15 LAB — GLUCOSE, CAPILLARY
Glucose-Capillary: 588 mg/dL (ref 70–99)
Glucose-Capillary: 517 mg/dL — ABNORMAL HIGH (ref 70–99)
Glucose-Capillary: 403 mg/dL — ABNORMAL HIGH (ref 70–99)
Glucose-Capillary: 356 mg/dL — ABNORMAL HIGH (ref 70–99)
Glucose-Capillary: 362 mg/dL — ABNORMAL HIGH (ref 70–99)

## 2011-05-15 LAB — CBC
HCT: 42.1 % (ref 36.0–46.0)
MCHC: 30.9 g/dL (ref 30.0–36.0)
MCV: 87 fL (ref 78.0–100.0)
RDW: 13.6 % (ref 11.5–15.5)
WBC: 19.8 10*3/uL — ABNORMAL HIGH (ref 4.0–10.5)

## 2011-05-15 LAB — POCT I-STAT 3, VENOUS BLOOD GAS (G3P V)
Bicarbonate: 6 mEq/L — ABNORMAL LOW (ref 20.0–24.0)
O2 Saturation: 78 %
TCO2: 7 mmol/L (ref 0–100)

## 2011-05-15 LAB — URINE MICROSCOPIC-ADD ON

## 2011-05-15 LAB — POCT I-STAT 3, ART BLOOD GAS (G3+)
Acid-base deficit: 13 mmol/L — ABNORMAL HIGH (ref 0.0–2.0)
O2 Saturation: 92 %
TCO2: 15 mmol/L (ref 0–100)
pH, Arterial: 7.223 — ABNORMAL LOW (ref 7.350–7.400)
pO2, Arterial: 71 mmHg — ABNORMAL LOW (ref 80.0–100.0)

## 2011-05-16 DIAGNOSIS — E1010 Type 1 diabetes mellitus with ketoacidosis without coma: Secondary | ICD-10-CM

## 2011-05-16 DIAGNOSIS — N179 Acute kidney failure, unspecified: Secondary | ICD-10-CM

## 2011-05-16 LAB — BLOOD GAS, ARTERIAL
Bicarbonate: 23.1 mEq/L (ref 20.0–24.0)
FIO2: 0.21 %
pCO2 arterial: 41.2 mmHg (ref 35.0–45.0)
pH, Arterial: 7.367 (ref 7.350–7.400)
pO2, Arterial: 63.5 mmHg — ABNORMAL LOW (ref 80.0–100.0)

## 2011-05-16 LAB — BASIC METABOLIC PANEL
BUN: 52 mg/dL — ABNORMAL HIGH (ref 6–23)
BUN: 54 mg/dL — ABNORMAL HIGH (ref 6–23)
BUN: 48 mg/dL — ABNORMAL HIGH (ref 6–23)
Calcium: 8.8 mg/dL (ref 8.4–10.5)
Calcium: 8.7 mg/dL (ref 8.4–10.5)
Chloride: 99 mEq/L (ref 96–112)
Chloride: 101 mEq/L (ref 96–112)
Creatinine, Ser: 1.52 mg/dL — ABNORMAL HIGH (ref 0.50–1.10)
GFR calc Af Amer: 41 mL/min — ABNORMAL LOW (ref >60)
GFR calc Af Amer: 43 mL/min — ABNORMAL LOW (ref >60)
GFR calc Af Amer: 59 mL/min — ABNORMAL LOW (ref >60)
GFR calc Af Amer: >60 mL/min (ref >60)
GFR calc non Af Amer: 34 mL/min — ABNORMAL LOW (ref >60)
GFR calc non Af Amer: 36 mL/min — ABNORMAL LOW (ref >60)
GFR calc non Af Amer: 48 mL/min — ABNORMAL LOW (ref >60)
Glucose, Bld: 200 mg/dL — ABNORMAL HIGH (ref 70–99)
Glucose, Bld: 179 mg/dL — ABNORMAL HIGH (ref 70–99)
Potassium: 3 mEq/L — ABNORMAL LOW (ref 3.5–5.1)
Potassium: 3.5 mEq/L (ref 3.5–5.1)
Potassium: 3.7 mEq/L (ref 3.5–5.1)
Potassium: 4.1 mEq/L (ref 3.5–5.1)
Sodium: 137 mEq/L (ref 135–145)

## 2011-05-16 LAB — CBC
HCT: 34.2 % — ABNORMAL LOW (ref 36.0–46.0)
Hemoglobin: 12.5 g/dL (ref 12.0–15.0)
MCH: 26.9 pg (ref 26.0–34.0)
MCHC: 36.5 g/dL — ABNORMAL HIGH (ref 30.0–36.0)
RDW: 13.1 % (ref 11.5–15.5)

## 2011-05-16 LAB — GLUCOSE, CAPILLARY
Glucose-Capillary: 211 mg/dL — ABNORMAL HIGH (ref 70–99)
Glucose-Capillary: 186 mg/dL — ABNORMAL HIGH (ref 70–99)
Glucose-Capillary: 178 mg/dL — ABNORMAL HIGH (ref 70–99)
Glucose-Capillary: 117 mg/dL — ABNORMAL HIGH (ref 70–99)
Glucose-Capillary: 100 mg/dL — ABNORMAL HIGH (ref 70–99)
Glucose-Capillary: 412 mg/dL — ABNORMAL HIGH (ref 70–99)

## 2011-05-17 DIAGNOSIS — N179 Acute kidney failure, unspecified: Secondary | ICD-10-CM

## 2011-05-17 DIAGNOSIS — E1010 Type 1 diabetes mellitus with ketoacidosis without coma: Secondary | ICD-10-CM

## 2011-05-17 LAB — CBC
Platelets: 169 10*3/uL (ref 150–400)
RBC: 4.2 MIL/uL (ref 3.87–5.11)
WBC: 7.4 10*3/uL (ref 4.0–10.5)

## 2011-05-17 LAB — BASIC METABOLIC PANEL
CO2: 24 mEq/L (ref 19–32)
Chloride: 105 mEq/L (ref 96–112)
Sodium: 136 mEq/L (ref 135–145)

## 2011-05-17 LAB — GLUCOSE, CAPILLARY
Glucose-Capillary: 218 mg/dL — ABNORMAL HIGH (ref 70–99)
Glucose-Capillary: 132 mg/dL — ABNORMAL HIGH (ref 70–99)
Glucose-Capillary: 216 mg/dL — ABNORMAL HIGH (ref 70–99)
Glucose-Capillary: 234 mg/dL — ABNORMAL HIGH (ref 70–99)

## 2011-05-17 LAB — HEPARIN LEVEL (UNFRACTIONATED): Heparin Unfractionated: <0.1 IU/mL — ABNORMAL LOW (ref 0.30–0.70)

## 2011-05-18 LAB — BASIC METABOLIC PANEL
BUN: 11 mg/dL (ref 6–23)
CO2: 27 mEq/L (ref 19–32)
Calcium: 9.1 mg/dL (ref 8.4–10.5)
Chloride: 106 mEq/L (ref 96–112)
Creatinine, Ser: <0.47 mg/dL — ABNORMAL LOW (ref 0.50–1.10)
Glucose, Bld: 164 mg/dL — ABNORMAL HIGH (ref 70–99)
Potassium: 3.4 mEq/L — ABNORMAL LOW (ref 3.5–5.1)
Sodium: 141 mEq/L (ref 135–145)

## 2011-05-18 LAB — CBC
HCT: 35.7 % — ABNORMAL LOW (ref 36.0–46.0)
MCV: 76.6 fL — ABNORMAL LOW (ref 78.0–100.0)
Platelets: 169 10*3/uL (ref 150–400)
RBC: 4.66 MIL/uL (ref 3.87–5.11)
WBC: 4.7 10*3/uL (ref 4.0–10.5)

## 2011-05-18 LAB — GLUCOSE, CAPILLARY
Glucose-Capillary: 229 mg/dL — ABNORMAL HIGH (ref 70–99)
Glucose-Capillary: 276 mg/dL — ABNORMAL HIGH (ref 70–99)
Glucose-Capillary: 379 mg/dL — ABNORMAL HIGH (ref 70–99)

## 2011-05-19 LAB — BASIC METABOLIC PANEL
BUN: 7 mg/dL (ref 6–23)
Calcium: 9.2 mg/dL (ref 8.4–10.5)
Creatinine, Ser: <0.47 mg/dL — ABNORMAL LOW (ref 0.50–1.10)
Glucose, Bld: 170 mg/dL — ABNORMAL HIGH (ref 70–99)
Sodium: 139 mEq/L (ref 135–145)

## 2011-05-19 LAB — GLUCOSE, CAPILLARY
Glucose-Capillary: 252 mg/dL — ABNORMAL HIGH (ref 70–99)
Glucose-Capillary: 77 mg/dL (ref 70–99)

## 2011-05-20 LAB — HEPARIN INDUCED THROMBOCYTOPENIA PNL
UFH High Dose UFH H: 0 % Release
UFH Low Dose 0.1 IU/mL: 0 % Release
UFH Low Dose 0.5 IU/mL: 0 % Release
UFH SRA Result: NEGATIVE

## 2011-06-17 NOTE — Discharge Summary (Signed)
NAMEMELISS, Regina [name]                  ACCOUNT NO.:  [id number]  MEDICAL RECORD NO.:  [id number]  LOCATION:  3021                         FACILITY:  [facility]  PHYSICIAN:  Ladell Pier, M.D.   DATE OF BIRTH:  1941-07-14  DATE OF ADMISSION:  05/15/2011 DATE OF DISCHARGE:  05/19/2011                              DISCHARGE SUMMARY   DISCHARGE DIAGNOSES: 1. Diabetic ketoacidosis, that initially admitted to the ICU team. 2. Upper respiratory infection. 3. Hypothyroidism. 4. Hypertension. 5. Diabetes type 2. 6. Hypothyroidism. 7. History of vulvar cancer in 2002. 8. Hyperlipidemia. 9. Depression. 10.History of bilateral tubal ligation. 11.History of diabetic retinopathy and legal blindness.  DISCHARGE MEDICATIONS: 1. Diabetic test strips. 2. Fluticasone nasal spray twice daily. 3. NPH 20 units twice daily. 4. Regular insulin 4 units with each meal three times daily. 5. Synthroid 100 mcg daily. 6. Aspirin 81 mg daily. 7. Atenolol 100 mg at bedtime. 8. Benzonatate 100 mg four times daily as needed. 9. Calcium 1000 mg daily. 10.Exforge 5/325 daily. 11.Iron sulfate 325 mg daily. 12.Liothyronine 25 mcg twice daily. 13.Multivitamin daily. 14.Ranitidine 150 mg daily. 15.Zetia 10 mg daily.  FOLLOWUP APPOINTMENTS:  The patient to follow up with Dr. Juleen China in 1 week.  PROCEDURES:  CT scan of the head, stable exam.  No acute intracranial abnormality.  Atrophy and chronic small-vessel disease.  Chest x-ray, no acute cardiopulmonary disease, specifically no pneumonia.  Further evaluation of AP and lateral chest x-ray may be performed as clinically indicated.  CONSULTANTS:  The patient was transferred from Pulmonary Critical Care Service.  HISTORY OF PRESENT ILLNESS:  The patient is a 70 year old patient with diabetes that is on insulin.  She also has hypertension, hypothyroidism, was well until about 4-5 days ago when she developed upper respiratory symptoms, has been taking p.o.  poorly, unable to take her meds over this time.  Was seen on May 13, 2011, by PCP, started on cough suppressant and azithromycin for possible bronchitis.  Over the last 24 hours, she has been confused, more lethargic, presented to the [facility] Emergency Room with DKA, glucose over 1000 with a pH of 6.97.  EKG showed some peak T-waves, admitted to the ICU for further management.  Past medical history, family history, social history, meds, allergies, review of systems per admission H and P.  PHYSICAL EXAM:  VITAL SIGNS:  At the time of discharge, temperature 97.8, pulse of 82, respirations 18, blood pressure 192/92, pulse ox 97% on room air. GENERAL:  The patient was sitting up in chair, well-nourished white female. HEENT:  Normocephalic, atraumatic.  Pupils reactive to light and without erythema. CARDIOVASCULAR:  Regular rate and rhythm. LUNGS:  Clear bilaterally. ABDOMEN:  Positive bowel sounds. EXTREMITIES:  No edema.  HOSPITAL COURSE: 1. DKA.  The patient was admitted to the hospital to the intensive     care unit and treated for DKA with insulin drip and IV fluids.     Over the course of the hospitalization, her symptoms improved and     she was transferred to the Medicine Service as her DKA has     resolved.  We will discharge her on NPH and regular insulin.  Per     discussion with her daughter, they cannot afford the Lantus, so I     will discharge her on NPH which is cheaper and on the regular     insulin which is cheaper than NovoLog rapid acting insulin. 2. Hypothyroidism.  Her TSH was very low, so the dose was decreased in     ICU.  We will continue her on that low dose and she will follow up     with Dr. Juleen China to get her TSH rechecked and her medications     adjusted. 3. Hypertension.  Blood pressure is elevated.  Atenolol was restarted.     We will restart her Exforge.  DISCHARGE LABS:  Sodium 139, potassium 3.8, chloride 103, CO2 of 31, glucose 170, BUN 7,  creatinine 0.97.  WBC 4.7, hemoglobin 12, MCV 76.6, platelet 169.  TSH of 0.017.  Time spent with the patient and doing this discharge and talking to her daughter is approximately 45 minutes.     Ladell Pier, M.D.     [place]/MEDQ  D:  05/19/2011  T:  05/19/2011  Job:  [id number]  cc:   Brooke Bonito, M.D.  Electronically Signed by Ladell Pier M.D. on 06/17/2011 10:26:39 PM

## 2012-12-23 ENCOUNTER — Encounter: Payer: Self-pay | Admitting: Internal Medicine

## 2013-01-31 ENCOUNTER — Encounter: Payer: Medicare Other | Admitting: Internal Medicine

## 2013-02-03 ENCOUNTER — Other Ambulatory Visit (HOSPITAL_COMMUNITY): Payer: Self-pay | Admitting: Internal Medicine

## 2013-02-03 DIAGNOSIS — R011 Cardiac murmur, unspecified: Secondary | ICD-10-CM

## 2013-02-09 HISTORY — PX: TRANSTHORACIC ECHOCARDIOGRAM: SHX275

## 2013-02-13 ENCOUNTER — Encounter: Payer: Self-pay | Admitting: Internal Medicine

## 2013-02-14 ENCOUNTER — Ambulatory Visit (HOSPITAL_COMMUNITY)
Admission: RE | Admit: 2013-02-14 | Discharge: 2013-02-14 | Disposition: A | Discharge status: Home / Self Care | Payer: Medicare Other | Source: Ambulatory Visit | Attending: Internal Medicine | Admitting: Internal Medicine

## 2013-02-14 DIAGNOSIS — I359 Nonrheumatic aortic valve disorder, unspecified: Secondary | ICD-10-CM | POA: Insufficient documentation

## 2013-02-14 DIAGNOSIS — R011 Cardiac murmur, unspecified: Secondary | ICD-10-CM | POA: Insufficient documentation

## 2013-02-14 NOTE — Progress Notes (Signed)
[facility]   2D echo completed 02/14/2013.   Cindy Sanjiv Castorena, RDCS  

## 2013-06-04 ENCOUNTER — Emergency Department (HOSPITAL_COMMUNITY): Payer: Medicare Other

## 2013-06-04 ENCOUNTER — Encounter (HOSPITAL_COMMUNITY): Payer: Self-pay | Admitting: Neurology

## 2013-06-04 ENCOUNTER — Inpatient Hospital Stay (HOSPITAL_COMMUNITY)
Admission: EM | Admit: 2013-06-04 | Discharge: 2013-06-06 | DRG: 638 | Disposition: A | Discharge status: Home / Self Care | Payer: Medicare Other | Attending: Internal Medicine | Admitting: Internal Medicine

## 2013-06-04 DIAGNOSIS — F329 Major depressive disorder, single episode, unspecified: Secondary | ICD-10-CM | POA: Diagnosis present

## 2013-06-04 DIAGNOSIS — E1110 Type 2 diabetes mellitus with ketoacidosis without coma: Secondary | ICD-10-CM | POA: Diagnosis present

## 2013-06-04 DIAGNOSIS — E0590 Thyrotoxicosis, unspecified without thyrotoxic crisis or storm: Secondary | ICD-10-CM

## 2013-06-04 DIAGNOSIS — N390 Urinary tract infection, site not specified: Secondary | ICD-10-CM | POA: Diagnosis present

## 2013-06-04 DIAGNOSIS — E119 Type 2 diabetes mellitus without complications: Secondary | ICD-10-CM

## 2013-06-04 DIAGNOSIS — E11319 Type 2 diabetes mellitus with unspecified diabetic retinopathy without macular edema: Secondary | ICD-10-CM | POA: Diagnosis present

## 2013-06-04 DIAGNOSIS — E039 Hypothyroidism, unspecified: Secondary | ICD-10-CM | POA: Diagnosis present

## 2013-06-04 DIAGNOSIS — Z87891 Personal history of nicotine dependence: Secondary | ICD-10-CM

## 2013-06-04 DIAGNOSIS — F3289 Other specified depressive episodes: Secondary | ICD-10-CM | POA: Diagnosis present

## 2013-06-04 DIAGNOSIS — E1310 Other specified diabetes mellitus with ketoacidosis without coma: Principal | ICD-10-CM | POA: Diagnosis present

## 2013-06-04 DIAGNOSIS — I10 Essential (primary) hypertension: Secondary | ICD-10-CM | POA: Diagnosis present

## 2013-06-04 DIAGNOSIS — Z8673 Personal history of transient ischemic attack (TIA), and cerebral infarction without residual deficits: Secondary | ICD-10-CM

## 2013-06-04 DIAGNOSIS — H5440 Blindness, one eye, unspecified eye: Secondary | ICD-10-CM | POA: Diagnosis present

## 2013-06-04 DIAGNOSIS — Z794 Long term (current) use of insulin: Secondary | ICD-10-CM

## 2013-06-04 DIAGNOSIS — E7800 Pure hypercholesterolemia, unspecified: Secondary | ICD-10-CM | POA: Diagnosis present

## 2013-06-04 DIAGNOSIS — Z79899 Other long term (current) drug therapy: Secondary | ICD-10-CM

## 2013-06-04 DIAGNOSIS — E785 Hyperlipidemia, unspecified: Secondary | ICD-10-CM | POA: Diagnosis present

## 2013-06-04 DIAGNOSIS — E1139 Type 2 diabetes mellitus with other diabetic ophthalmic complication: Secondary | ICD-10-CM | POA: Diagnosis present

## 2013-06-04 DIAGNOSIS — F411 Generalized anxiety disorder: Secondary | ICD-10-CM | POA: Diagnosis present

## 2013-06-04 DIAGNOSIS — E669 Obesity, unspecified: Secondary | ICD-10-CM | POA: Diagnosis present

## 2013-06-04 HISTORY — DX: Essential (primary) hypertension: I10

## 2013-06-04 HISTORY — DX: Transient cerebral ischemic attack, unspecified: G45.9

## 2013-06-04 LAB — GLUCOSE, CAPILLARY
Glucose-Capillary: >600 mg/dL (ref 70–99)
Glucose-Capillary: >600 mg/dL (ref 70–99)
Glucose-Capillary: 349 mg/dL — ABNORMAL HIGH (ref 70–99)

## 2013-06-04 LAB — CBC WITH DIFFERENTIAL/PLATELET
Basophils Absolute: 0 10*3/uL (ref 0.0–0.1)
Basophils Relative: 0 % (ref 0–1)
HCT: 39.3 % (ref 36.0–46.0)
MCHC: 33.8 g/dL (ref 30.0–36.0)
Monocytes Absolute: 1.7 10*3/uL — ABNORMAL HIGH (ref 0.1–1.0)
Neutro Abs: 8.1 10*3/uL — ABNORMAL HIGH (ref 1.7–7.7)
Platelets: 232 10*3/uL (ref 150–400)
RDW: 13.5 % (ref 11.5–15.5)
WBC: 11.2 10*3/uL — ABNORMAL HIGH (ref 4.0–10.5)

## 2013-06-04 LAB — POCT I-STAT 3, VENOUS BLOOD GAS (G3P V)
Bicarbonate: 21.8 mEq/L (ref 20.0–24.0)
O2 Saturation: 67 %
pCO2, Ven: 45.2 mmHg (ref 45.0–50.0)
pO2, Ven: 38 mmHg (ref 30.0–45.0)

## 2013-06-04 LAB — BASIC METABOLIC PANEL
BUN: 22 mg/dL (ref 6–23)
BUN: 21 mg/dL (ref 6–23)
BUN: 20 mg/dL (ref 6–23)
CO2: 22 mEq/L (ref 19–32)
Calcium: 9.1 mg/dL (ref 8.4–10.5)
Calcium: 8.5 mg/dL (ref 8.4–10.5)
Chloride: 105 mEq/L (ref 96–112)
Creatinine, Ser: 0.96 mg/dL (ref 0.50–1.10)
Creatinine, Ser: 0.89 mg/dL (ref 0.50–1.10)
Creatinine, Ser: 0.83 mg/dL (ref 0.50–1.10)
GFR calc Af Amer: 74 mL/min — ABNORMAL LOW (ref >90)
GFR calc Af Amer: 81 mL/min — ABNORMAL LOW (ref >90)
GFR calc Af Amer: 81 mL/min — ABNORMAL LOW (ref >90)
GFR calc non Af Amer: 59 mL/min — ABNORMAL LOW (ref >90)
GFR calc non Af Amer: 64 mL/min — ABNORMAL LOW (ref >90)
GFR calc non Af Amer: 70 mL/min — ABNORMAL LOW (ref >90)
GFR calc non Af Amer: 70 mL/min — ABNORMAL LOW (ref >90)
Glucose, Bld: 347 mg/dL — ABNORMAL HIGH (ref 70–99)
Potassium: 3.3 mEq/L — ABNORMAL LOW (ref 3.5–5.1)
Potassium: 3.1 mEq/L — ABNORMAL LOW (ref 3.5–5.1)
Sodium: 130 mEq/L — ABNORMAL LOW (ref 135–145)
Sodium: 136 mEq/L (ref 135–145)

## 2013-06-04 LAB — URINALYSIS, ROUTINE W REFLEX MICROSCOPIC
Bilirubin Urine: NEGATIVE
Ketones, ur: 15 mg/dL — AB
Leukocytes, UA: NEGATIVE
Nitrite: NEGATIVE
Specific Gravity, Urine: 1.028 (ref 1.005–1.030)
Urobilinogen, UA: 0.2 mg/dL (ref 0.0–1.0)

## 2013-06-04 LAB — COMPREHENSIVE METABOLIC PANEL
BUN: 26 mg/dL — ABNORMAL HIGH (ref 6–23)
Calcium: 9.2 mg/dL (ref 8.4–10.5)
GFR calc Af Amer: 60 mL/min — ABNORMAL LOW (ref >90)
Glucose, Bld: 1082 mg/dL (ref 70–99)
Total Protein: 6.7 g/dL (ref 6.0–8.3)

## 2013-06-04 MED ORDER — SODIUM CHLORIDE 0.9 % IV SOLN
INTRAVENOUS | Status: DC
  Administered 2013-06-05: 0.8 [IU]/h via INTRAVENOUS
  Filled 2013-06-04: qty 1

## 2013-06-04 MED ORDER — FERROUS FUMARATE 325 (106 FE) MG PO TABS
1.0000 | ORAL_TABLET | Freq: Every day | ORAL | Status: DC
  Administered 2013-06-05 – 2013-06-06 (×2): 106 mg via ORAL
  Filled 2013-06-04 (×2): qty 1

## 2013-06-04 MED ORDER — DEXTROSE-NACL 5-0.45 % IV SOLN
INTRAVENOUS | Status: DC
  Administered 2013-06-04: 1000 mL via INTRAVENOUS

## 2013-06-04 MED ORDER — DEXTROSE 50 % IV SOLN
25.0000 mL | INTRAVENOUS | Status: DC | PRN
  Administered 2013-06-06: 25 mL via INTRAVENOUS
  Filled 2013-06-04: qty 50

## 2013-06-04 MED ORDER — OMEGA-3-ACID ETHYL ESTERS 1 G PO CAPS
1.0000 g | ORAL_CAPSULE | Freq: Every day | ORAL | Status: DC
  Administered 2013-06-05 – 2013-06-06 (×2): 1 g via ORAL
  Filled 2013-06-04 (×2): qty 1

## 2013-06-04 MED ORDER — SODIUM CHLORIDE 0.9 % IV SOLN
INTRAVENOUS | Status: DC
  Administered 2013-06-04: 13:00:00 via INTRAVENOUS

## 2013-06-04 MED ORDER — INSULIN ASPART 100 UNIT/ML ~~LOC~~ SOLN
12.0000 [IU] | Freq: Once | SUBCUTANEOUS | Status: AC
  Administered 2013-06-04: 12 [IU] via INTRAVENOUS
  Filled 2013-06-04: qty 1

## 2013-06-04 MED ORDER — SODIUM CHLORIDE 0.9 % IV SOLN: INTRAVENOUS | Status: DC

## 2013-06-04 MED ORDER — LIOTHYRONINE SODIUM 25 MCG PO TABS
25.0000 ug | ORAL_TABLET | Freq: Two times a day (BID) | ORAL | Status: DC
  Administered 2013-06-04 – 2013-06-06 (×4): 25 ug via ORAL
  Filled 2013-06-04 (×6): qty 1

## 2013-06-04 MED ORDER — ENOXAPARIN SODIUM 40 MG/0.4ML ~~LOC~~ SOLN
40.0000 mg | SUBCUTANEOUS | Status: DC
  Administered 2013-06-04 – 2013-06-05 (×2): 40 mg via SUBCUTANEOUS
  Filled 2013-06-04 (×3): qty 0.4

## 2013-06-04 MED ORDER — INSULIN GLARGINE 100 UNIT/ML ~~LOC~~ SOLN
40.0000 [IU] | Freq: Once | SUBCUTANEOUS | Status: AC
  Administered 2013-06-05: 40 [IU] via SUBCUTANEOUS
  Filled 2013-06-04: qty 0.4

## 2013-06-04 MED ORDER — SODIUM CHLORIDE 0.9 % IV SOLN
INTRAVENOUS | Status: AC
  Administered 2013-06-04 (×2): via INTRAVENOUS

## 2013-06-04 MED ORDER — ASPIRIN EC 81 MG PO TBEC
81.0000 mg | DELAYED_RELEASE_TABLET | Freq: Every day | ORAL | Status: DC
  Administered 2013-06-05 – 2013-06-06 (×2): 81 mg via ORAL
  Filled 2013-06-04 (×2): qty 1

## 2013-06-04 MED ORDER — SODIUM CHLORIDE 0.9 % IV BOLUS (SEPSIS)
1000.0000 mL | Freq: Once | INTRAVENOUS | Status: AC
  Administered 2013-06-04: 1000 mL via INTRAVENOUS

## 2013-06-04 MED ORDER — LATANOPROST 0.005 % OP SOLN
1.0000 [drp] | Freq: Every day | OPHTHALMIC | Status: DC
  Administered 2013-06-04 – 2013-06-05 (×2): 1 [drp] via OPHTHALMIC
  Filled 2013-06-04: qty 2.5

## 2013-06-04 MED ORDER — ATENOLOL 100 MG PO TABS
100.0000 mg | ORAL_TABLET | Freq: Every day | ORAL | Status: DC
  Administered 2013-06-05: 100 mg via ORAL
  Filled 2013-06-04 (×3): qty 1

## 2013-06-04 MED ORDER — FAMOTIDINE 10 MG PO TABS
10.0000 mg | ORAL_TABLET | Freq: Two times a day (BID) | ORAL | Status: DC
  Administered 2013-06-04 – 2013-06-06 (×4): 10 mg via ORAL
  Filled 2013-06-04 (×5): qty 1

## 2013-06-04 MED ORDER — IRBESARTAN 300 MG PO TABS
300.0000 mg | ORAL_TABLET | Freq: Every day | ORAL | Status: DC
  Administered 2013-06-05: 300 mg via ORAL
  Filled 2013-06-04: qty 1

## 2013-06-04 MED ORDER — EZETIMIBE 10 MG PO TABS
10.0000 mg | ORAL_TABLET | Freq: Every day | ORAL | Status: DC
  Administered 2013-06-05 – 2013-06-06 (×2): 10 mg via ORAL
  Filled 2013-06-04 (×2): qty 1

## 2013-06-04 MED ORDER — DEXTROSE 5 % IV SOLN
1.0000 g | INTRAVENOUS | Status: DC
  Administered 2013-06-04 – 2013-06-05 (×2): 1 g via INTRAVENOUS
  Filled 2013-06-04 (×4): qty 10

## 2013-06-04 MED ORDER — DEXTROSE-NACL 5-0.45 % IV SOLN: INTRAVENOUS | Status: DC

## 2013-06-04 MED ORDER — INSULIN REGULAR BOLUS VIA INFUSION
0.0000 [IU] | Freq: Three times a day (TID) | INTRAVENOUS | Status: DC
  Filled 2013-06-04: qty 10

## 2013-06-04 MED ORDER — DEXTROSE 50 % IV SOLN: 25.0000 mL | INTRAVENOUS | Status: DC | PRN

## 2013-06-04 MED ORDER — POTASSIUM CHLORIDE 10 MEQ/100ML IV SOLN
10.0000 meq | INTRAVENOUS | Status: AC
  Administered 2013-06-04 (×2): 10 meq via INTRAVENOUS
  Filled 2013-06-04: qty 200

## 2013-06-04 MED ORDER — AMLODIPINE BESYLATE-VALSARTAN 5-320 MG PO TABS: 1.0000 | ORAL_TABLET | Freq: Every day | ORAL | Status: DC

## 2013-06-04 MED ORDER — LEVOTHYROXINE SODIUM 100 MCG PO TABS
100.0000 ug | ORAL_TABLET | Freq: Every day | ORAL | Status: DC
  Administered 2013-06-05: 100 ug via ORAL
  Filled 2013-06-04 (×3): qty 1

## 2013-06-04 MED ORDER — AMLODIPINE BESYLATE 5 MG PO TABS
5.0000 mg | ORAL_TABLET | Freq: Every day | ORAL | Status: DC
  Administered 2013-06-05: 5 mg via ORAL
  Filled 2013-06-04: qty 1

## 2013-06-04 MED ORDER — SODIUM CHLORIDE 0.9 % IV SOLN
INTRAVENOUS | Status: DC
  Administered 2013-06-04: 5.4 [IU]/h via INTRAVENOUS
  Filled 2013-06-04: qty 1

## 2013-06-04 NOTE — ED Notes (Signed)
Patient returned from X-ray 

## 2013-06-04 NOTE — ED Notes (Signed)
Hospitalist MD at bedside. 

## 2013-06-04 NOTE — ED Notes (Signed)
Prior to starting Insulin gtt; CBG reading "Hi". Starting [employer] per MD orders with 601 value.

## 2013-06-04 NOTE — ED Notes (Signed)
Dr.Ray shown results of VBG lab. ED-lab.

## 2013-06-04 NOTE — H&P (Addendum)
[facility] History and Physical  Regina [name]:[id number] DOB: Aug 02, 1941 DOA: 06/04/2013  Referring physician:ED PCP: Roxy Manns, MD   Chief Complaint:  Weakness for past 4 days Elevated blood glucose x 1 day  HPI:  72 year old obese female with history of diabetes mellitus type 2 on insulin (last A1c 8 about 2  months back as per patient), hypertension, hyperlipidemia, history of TIA who was brought in by her daughter for elevated blood glucose noted this morning. Her daughter present at bedside patient has been feeling quite weak for almost 7-10 days. She had some [place] food 4 days back in the next day she had 2 episodes of vomiting. She has been feeling increasingly fatigued and has poor appetite for last 4 days . Her blood glucose has been ranging from 250 to 300s for past few days. She checked her blood glucose this morning and was reading >600. Had any fever, chills, headache, blurred vision, abdominal pain, joint pains, chest pain, palpitations, shortness of breath, bowel or urinary symptoms. She does report increased thirst since yesterday. Denies polyuria.denies recent illness or sick contacts. Reports taking her insulin regularly. Does not seem to be compliant with her diet.     Review of Systems: (positive symptoms and bold) Constitutional: Denies fever, chills, diaphoresis, appetite change and fatigue.  HEENT: Denies photophobia, eye pain, redness, hearing loss, ear pain, congestion, sore throat, rhinorrhea, sneezing, mouth sores, trouble swallowing, neck pain, neck stiffness and tinnitus.   Respiratory: Denies SOB, DOE, cough, chest tightness,  and wheezing.   Cardiovascular: Denies chest pain, palpitations and leg swelling.  Gastrointestinal: vomiting 4 days back, Denies nausea,  abdominal pain, diarrhea, constipation, blood in stool and abdominal distention.  Genitourinary: Denies dysuria, urgency, frequency, hematuria, flank pain and difficulty urinating.   Endocrine: Denies: hot or cold intolerance, sweats,  polyuria, polydipsia. Musculoskeletal: Denies myalgias, back pain, joint swelling, arthralgias and gait problem.  Skin: Denies pallor, rash and wound.  Neurological: weakness, Denies dizziness, seizures, syncope,  light-headedness, numbness and headaches.  Hematological: Denies adenopathy.  Psychiatric/Behavioral: Denies  mood changes, confusion, nervousness, sleep disturbance and agitation   Past Medical History  Diagnosis Date  . Diabetes mellitus without complication   . Hypertension   . Hypercholesteremia   . Blind left eye   . TIA (transient ischemic attack)    History reviewed. No pertinent past surgical history. Social History:  reports that she has never smoked. She does not have any smokeless tobacco history on file. She reports that she does not drink alcohol or use illicit drugs.  Allergies  Allergen Reactions  . Amoxicillin     REACTION: rash  . Ciprofloxacin     REACTION: hives  . Codeine     REACTION: nervous, shaky  . Duloxetine     REACTION: reaction not known  . Hydrochlorothiazide W-Triamterene     REACTION: hyponatremia  . Ticlopidine Hcl     REACTION: bleeding    No family history on file.  Prior to Admission medications   Medication Sig Start Date End Date Taking? Authorizing Provider  amLODipine-valsartan (EXFORGE) 5-320 MG per tablet Take 1 tablet by mouth daily.   Yes Historical Provider, MD  aspirin EC 81 MG tablet Take 81 mg by mouth daily.   Yes Historical Provider, MD  atenolol (TENORMIN) 100 MG tablet Take 100 mg by mouth daily.   Yes Historical Provider, MD  Calcium Carbonate-Vit D-Min (CALCIUM 1200 PO) Take 1 tablet by mouth daily.   Yes Historical Provider, MD  ezetimibe (ZETIA) 10 MG tablet Take 10 mg by mouth daily.   Yes Historical Provider, MD  ferrous fumarate (HEMOCYTE - 106 MG FE) 325 (106 FE) MG TABS tablet Take 1 tablet by mouth daily.   Yes Historical Provider, MD  glipiZIDE  (GLUCOTROL) 5 MG tablet Take 5 mg by mouth daily.   Yes Historical Provider, MD  insulin aspart (NOVOLOG) 100 UNIT/ML injection Inject 10-20 Units into the skin 3 (three) times daily. 20 units in the am, 10 units at lunch and 10 units at night   Yes Historical Provider, MD  insulin NPH (HUMULIN N,NOVOLIN N) 100 UNIT/ML injection Inject 60 Units into the skin 2 (two) times daily.   Yes Historical Provider, MD  levothyroxine (SYNTHROID, LEVOTHROID) 100 MCG tablet Take 100 mcg by mouth daily before breakfast.   Yes Historical Provider, MD  liothyronine (CYTOMEL) 25 MCG tablet Take 25 mcg by mouth 2 (two) times daily.   Yes Historical Provider, MD  metFORMIN (GLUCOPHAGE) 500 MG tablet Take 1,000 mg by mouth 2 (two) times daily.   Yes Historical Provider, MD  Omega-3 Fatty Acids (FISH OIL) 1000 MG CAPS Take 2,000 mg by mouth daily.   Yes Historical Provider, MD  ranitidine (ZANTAC) 75 MG tablet Take 75 mg by mouth 2 (two) times daily.   Yes Historical Provider, MD  Travoprost, BAK Free, (TRAVATAN) 0.004 % SOLN ophthalmic solution Place 1 drop into both eyes at bedtime.   Yes Historical Provider, MD    Physical Exam:  Filed Vitals:   06/04/13 0957 06/04/13 1149 06/04/13 1230 06/04/13 1316  BP: 149/39 145/68 144/50 155/62  Pulse: 74 71 75 74  Temp: 97.5 F (36.4 C)     TempSrc: Oral     Resp: 20 19 21 22   SpO2: 95% 94% 94% 94%    Constitutional: Vital signs reviewed. Elderly obese female lying in bed in no acute distress. HEENT: No pallor, no icterus, dry oral mucosa Cardiovascular: RRR, S1 normal, S2 normal, 3/6 systolic murmur, pulses symmetric and intact bilaterally Pulmonary/Chest: CTAB, no wheezes, rales, or rhonchi Abdominal: Soft. Non-tender, non-distended, bowel sounds are normal, no masses, organomegaly, or guarding present.   Has a pannus with mild erythema GU: no CVA tenderness Musculoskeletal: No joint deformities, erythema, or stiffness, ROM full and no nontender Ext: no edema  and no cyanosis, pulses palpable bilaterally Hematology: no cervical adenopathy.  Neurological: A&O x3, non focal Skin: Warm, dry and intact. No rash, cyanosis, or clubbing.  Psychiatric: Normal mood and affect. speech and behavior is normal. Judgment and thought content normal. Cognition and memory are normal.   Labs on Admission:  Basic Metabolic Panel:  Recent Labs Lab 06/04/13 1100  NA 122*  K 4.7  CL 84*  CO2 20  GLUCOSE 1082*  BUN 26*  CREATININE 1.07  CALCIUM 9.2   Liver Function Tests:  Recent Labs Lab 06/04/13 1100  AST 10  ALT 18  ALKPHOS 68  BILITOT 0.6  PROT 6.7  ALBUMIN 3.4*   No results found for this basename: LIPASE, AMYLASE,  in the last 168 hours No results found for this basename: AMMONIA,  in the last 168 hours CBC:  Recent Labs Lab 06/04/13 1100  WBC 11.2*  NEUTROABS 8.1*  HGB 13.3  HCT 39.3  MCV 91.8  PLT 232   Cardiac Enzymes:  Recent Labs Lab 06/04/13 1100  TROPONINI <0.30   BNP: No components found with this basename: POCBNP,  CBG:  Recent Labs Lab 06/04/13 0959 06/04/13 1246  GLUCAP >600* >600*    Radiological Exams on Admission: Dg Chest 2 View  06/04/2013   *RADIOLOGY REPORT*  Clinical Data: Hyperglycemia.  CHEST - 2 VIEW  Comparison: Two-view chest x-ray 12/20/2012, 08/07/2010 [facility].  Findings: Cardiac silhouette moderately enlarged but stable, allowing for differences in technique.  Thoracic aorta mildly atherosclerotic, unchanged.  Hilar and mediastinal contours otherwise unremarkable.  Lungs clear.  Bronchovascular markings normal.  Pulmonary vascularity normal.  No pneumothorax.  No pleural effusions.  Degenerative changes and DISH involving the thoracic spine.  No significant interval change.  IMPRESSION: Cardiomegaly.  No acute cardiopulmonary disease.  Stable examination.   Original Report Authenticated By: Hulan Saas, M.D.    EKG: NSR@69 , No ST-T changes  Assessment/Plan Principal  Problem:   DKA (diabetic ketoacidoses) Admit to stepdown . Started on glucose stabilizer in ED. Monitor BMET  q2hr to follow anion gap. Once gap closed will ordert 40 units of lantus and stop insulin drip after 2 hours. Start moderate SSI thereafter.  check A1C. Reports last A1C around 8 2 months back. UA does suggest UTI which may have triggered these symptoms.  diabetic coordinator consult. Patient on quite high dose of novolog bd and premeal insulin and may need to simplify regimen. Daughter reports patient to have medicare and switching to long acting insulin with meal coverage should be a better option if insurance not an issue. Check lipid panel.    Active Problems:   HYPOTHYROIDISM Resume synthroid and liothyronine. Check TSH  UTI Check urine cx. Will place on IV rocephin    Hyperlipidemia  check Lipid panel  HTN  resume BP meds  Systolic murmur  denies hx of heart failure. Reports following with [facility]. i will add them as consulting team.    DVT prophylaxis; sq lovenox  Code Status:full code Family Communication:daughter at bedside Disposition Plan: home once stable  Eddie North [facility] Pager [phone]  If 7PM-7AM, please contact night-coverage [email or web address] Password [facility] 06/04/2013, 2:15 PM  Total time spent: 70 minutes

## 2013-06-04 NOTE — ED Notes (Signed)
CRITICAL VALUE ALERT  Critical value received:  Glucose 1082  Date of notification: 06/04/13  Time of notification:  1205  Critical value read back:yes;   Nurse who received alert: Ranelle Oyster MD notified (1st page):  Emokpae  Time of first page:  1205  Time MD responded: 1205

## 2013-06-04 NOTE — ED Notes (Signed)
Reporting dizziness, weakness since Thursday. Vomiting on Friday. CBG has been high since yesterday, meter has been reading high.

## 2013-06-04 NOTE — ED Notes (Signed)
Unable to give sample at this time

## 2013-06-04 NOTE — ED Provider Notes (Signed)
CSN: [id number]     Arrival date & time 06/04/13  4098 History     First MD Initiated Contact with Patient 06/04/13 [phone]     Chief Complaint  Patient presents with  . Hyperglycemia   (Consider location/radiation/quality/duration/timing/severity/associated sxs/prior Treatment) HPI 72 y o W F with PMH of DM2, DM retinopathy, hyperlipidemia, hypertension, hypothyroidism, TIA, depression and anxiety. Pt preseneted today with c/o of high blood sugar levels- as per her glucometer readings of High last night and this morning. Pt said on Thursday night she after dinner she had 2 episodes of vomiting, family memebers who ate same meal had no complaints. Vomitus was non bloody, contained recently ingested meal. No subsequent episodes, no diarrhoea, no abdominal pains, no bloating, no chest pains or dizziness, no fever, no cough,no SOB, no dysuria. Pt has been feeling weak since Thursday, has had only two pieces of toast and a soda, so she didn't take her insulin till last night- she took Humulin- 60u. Pt is currently on Novolog- 20 20 10, and humulin 60u BID. Pts blood sugars usually run -200s-300s.  Past Medical History  Diagnosis Date  . Diabetes mellitus without complication   . Hypertension   . Hypercholesteremia   . Blind left eye   . TIA (transient ischemic attack)    History reviewed. No pertinent past surgical history. No family history on file. History  Substance Use Topics  . Smoking status: Former Smoker    Quit date: 10/12/1992  . Smokeless tobacco: Never Used  . Alcohol Use: No   OB History   Grav Para Term Preterm Abortions TAB SAB Ect Mult Living                 Review of Systems ROS as per HPI.  Allergies  Amoxicillin; Ciprofloxacin; Codeine; Duloxetine; Hydrochlorothiazide w-triamterene; and Ticlopidine hcl  Home Medications   No current outpatient prescriptions on file. BP 148/44  Pulse 76  Temp(Src) 97.5 F (36.4 C) (Oral)  Resp 16  SpO2 95% Physical  Exam GENERAL- alert, co-operative,obese,  appears as stated age, not in any distress. HEENT- Atraumatic, normocephalic, PERRL, EOMI, oral mucosa appears dry, several tooth missing, pt usu wears dentures. No carotid bruit, no cervical LN enlargement, fullness in the thyroid area. CARDIAC- RRR, systolic murmur heard best in aortic area, no rubs or gallops. RESP- Moving equal volumes of air, and clear to auscultation bilaterally, no wheezes or crackles. ABDOMEN- Soft, non tender, mild bruising in area she injects her medication, no palpable masses or organomegaly, bowel sounds present. BACK- Normal curvature of the spine, No tenderness along the vertebrae, no CVA tenderness. NEURO- No obvious crn n deficit, strenght equal and present in all extremities. EXTREMITIES- pulse- Dorsalis pedis- reduced, symmetric. SKIN- Warm, dry, No rash or lesion. PSYCH- Normal mood and affect, appropriate thought content and speech.  ED Course   Procedures (including critical care time) EKG- rate- 69 bpm, regular, no ST or T wave abnormalities, QRS < 0.12s.   Labs Reviewed  GLUCOSE, CAPILLARY - Abnormal; Notable for the following:    Glucose-Capillary >600 (*)    All other components within normal limits  CBC WITH DIFFERENTIAL - Abnormal; Notable for the following:    WBC 11.2 (*)    Neutro Abs 8.1 (*)    Monocytes Relative 15 (*)    Monocytes Absolute 1.7 (*)    All other components within normal limits  URINALYSIS, ROUTINE W REFLEX MICROSCOPIC - Abnormal; Notable for the following:    Glucose, UA >  1000 (*)    Ketones, ur 15 (*)    All other components within normal limits  COMPREHENSIVE METABOLIC PANEL - Abnormal; Notable for the following:    Sodium 122 (*)    Chloride 84 (*)    Glucose, Bld 1082 (*)    BUN 26 (*)    Albumin 3.4 (*)    GFR calc non Af Amer 51 (*)    GFR calc Af Amer 60 (*)    All other components within normal limits  GLUCOSE, CAPILLARY - Abnormal; Notable for the following:     Glucose-Capillary >600 (*)    All other components within normal limits  BASIC METABOLIC PANEL - Abnormal; Notable for the following:    Sodium 130 (*)    Chloride 93 (*)    Glucose, Bld 623 (*)    BUN 24 (*)    GFR calc non Af Amer 59 (*)    GFR calc Af Amer 68 (*)    All other components within normal limits  GLUCOSE, CAPILLARY - Abnormal; Notable for the following:    Glucose-Capillary 586 (*)    All other components within normal limits  POCT I-STAT 3, BLOOD GAS (G3P V) - Abnormal; Notable for the following:    Acid-base deficit 5.0 (*)    All other components within normal limits  URINE CULTURE  MRSA PCR SCREENING  TROPONIN I  URINE MICROSCOPIC-ADD ON  BASIC METABOLIC PANEL  BASIC METABOLIC PANEL  BASIC METABOLIC PANEL  HEMOGLOBIN A1C  TSH  LIPID PANEL   Dg Chest 2 View  06/04/2013   *RADIOLOGY REPORT*  Clinical Data: Hyperglycemia.  CHEST - 2 VIEW  Comparison: Two-view chest x-ray 12/20/2012, 08/07/2010 [facility].  Findings: Cardiac silhouette moderately enlarged but stable, allowing for differences in technique.  Thoracic aorta mildly atherosclerotic, unchanged.  Hilar and mediastinal contours otherwise unremarkable.  Lungs clear.  Bronchovascular markings normal.  Pulmonary vascularity normal.  No pneumothorax.  No pleural effusions.  Degenerative changes and DISH involving the thoracic spine.  No significant interval change.  IMPRESSION: Cardiomegaly.  No acute cardiopulmonary disease.  Stable examination.   Original Report Authenticated By: Hulan Saas, M.D.   1. DKA (diabetic ketoacidoses)   2. Unspecified essential hypertension   3. Unspecified hypothyroidism     MDM  Diabetic Ketoacidosis-  - CMP- Na-122, k- 4.7, Cl- 84, Bicarb- 20, Blood Glucose- 1082. Anion Gap- 33 - Blood gases- Ph- 7.2 - CBC- WBC- 11.1 - Urinalysis- Glycosuria and Ketones. - Troponins- WNL - Chest Xray- No acute cardiopulmonary Dx. - Iv n/s bolus. - EKG-  - Insulin-  0.1U/kg/hr and IVF N/s - continious. - Admitted to Internal Medicine service for continued management.   Kennis Carina, MD 06/04/13 1710

## 2013-06-04 NOTE — ED Notes (Signed)
Patient transported to X-ray 

## 2013-06-05 DIAGNOSIS — E1110 Type 2 diabetes mellitus with ketoacidosis without coma: Secondary | ICD-10-CM

## 2013-06-05 LAB — LIPID PANEL
Cholesterol: 211 mg/dL — ABNORMAL HIGH (ref 0–200)
HDL: 33 mg/dL — ABNORMAL LOW (ref >39)
Total CHOL/HDL Ratio: 6.4 RATIO
Triglycerides: 179 mg/dL — ABNORMAL HIGH (ref <150)
VLDL: 36 mg/dL (ref 0–40)

## 2013-06-05 LAB — GLUCOSE, CAPILLARY
Glucose-Capillary: 124 mg/dL — ABNORMAL HIGH (ref 70–99)
Glucose-Capillary: 127 mg/dL — ABNORMAL HIGH (ref 70–99)
Glucose-Capillary: 142 mg/dL — ABNORMAL HIGH (ref 70–99)
Glucose-Capillary: 191 mg/dL — ABNORMAL HIGH (ref 70–99)
Glucose-Capillary: 285 mg/dL — ABNORMAL HIGH (ref 70–99)
Glucose-Capillary: 370 mg/dL — ABNORMAL HIGH (ref 70–99)
Glucose-Capillary: 326 mg/dL — ABNORMAL HIGH (ref 70–99)

## 2013-06-05 LAB — URINE CULTURE: Colony Count: 30000

## 2013-06-05 MED ORDER — AMLODIPINE BESYLATE-VALSARTAN 5-320 MG PO TABS: 1.0000 | ORAL_TABLET | Freq: Every day | ORAL | Status: DC

## 2013-06-05 MED ORDER — INSULIN ASPART 100 UNIT/ML ~~LOC~~ SOLN
0.0000 [IU] | Freq: Every day | SUBCUTANEOUS | Status: DC
  Administered 2013-06-05: 2 [IU] via SUBCUTANEOUS

## 2013-06-05 MED ORDER — ONDANSETRON HCL 4 MG/2ML IJ SOLN
INTRAMUSCULAR | Status: AC
  Filled 2013-06-05: qty 2

## 2013-06-05 MED ORDER — AMLODIPINE BESYLATE 5 MG PO TABS
5.0000 mg | ORAL_TABLET | Freq: Every day | ORAL | Status: DC
  Filled 2013-06-05: qty 1

## 2013-06-05 MED ORDER — IRBESARTAN 300 MG PO TABS
300.0000 mg | ORAL_TABLET | Freq: Every day | ORAL | Status: DC
  Filled 2013-06-05: qty 1

## 2013-06-05 MED ORDER — INSULIN ASPART 100 UNIT/ML ~~LOC~~ SOLN
10.0000 [IU] | Freq: Three times a day (TID) | SUBCUTANEOUS | Status: DC
  Administered 2013-06-05 (×2): 10 [IU] via SUBCUTANEOUS

## 2013-06-05 MED ORDER — METFORMIN HCL 500 MG PO TABS: 1000.0000 mg | ORAL_TABLET | Freq: Two times a day (BID) | ORAL | Status: DC

## 2013-06-05 MED ORDER — INSULIN DETEMIR 100 UNIT/ML ~~LOC~~ SOLN
45.0000 [IU] | Freq: Two times a day (BID) | SUBCUTANEOUS | Status: DC
  Administered 2013-06-05 – 2013-06-06 (×3): 45 [IU] via SUBCUTANEOUS
  Filled 2013-06-05 (×4): qty 0.45

## 2013-06-05 MED ORDER — INSULIN DETEMIR 100 UNIT/ML ~~LOC~~ SOLN
45.0000 [IU] | Freq: Two times a day (BID) | SUBCUTANEOUS | Status: DC
  Filled 2013-06-05: qty 0.45

## 2013-06-05 MED ORDER — INSULIN ASPART 100 UNIT/ML ~~LOC~~ SOLN
0.0000 [IU] | Freq: Three times a day (TID) | SUBCUTANEOUS | Status: DC
Start: 2013-06-05 — End: 2013-06-06
  Administered 2013-06-05: 15 [IU] via SUBCUTANEOUS
  Administered 2013-06-06: 4 [IU] via SUBCUTANEOUS

## 2013-06-05 MED ORDER — GLIPIZIDE 5 MG PO TABS: 5.0000 mg | ORAL_TABLET | Freq: Every day | ORAL | Status: DC

## 2013-06-05 MED ORDER — HYDRALAZINE HCL 20 MG/ML IJ SOLN
10.0000 mg | Freq: Four times a day (QID) | INTRAMUSCULAR | Status: DC | PRN
  Administered 2013-06-05: 10 mg via INTRAVENOUS
  Filled 2013-06-05: qty 1

## 2013-06-05 MED ORDER — AMLODIPINE BESYLATE 10 MG PO TABS
10.0000 mg | ORAL_TABLET | Freq: Every day | ORAL | Status: DC
  Administered 2013-06-05 – 2013-06-06 (×2): 10 mg via ORAL
  Filled 2013-06-05 (×2): qty 1

## 2013-06-05 MED ORDER — IRBESARTAN 300 MG PO TABS
300.0000 mg | ORAL_TABLET | Freq: Every day | ORAL | Status: DC
  Administered 2013-06-05 – 2013-06-06 (×2): 300 mg via ORAL
  Filled 2013-06-05 (×2): qty 1

## 2013-06-05 MED ORDER — INSULIN NPH (HUMAN) (ISOPHANE) 100 UNIT/ML ~~LOC~~ SUSP: 60.0000 [IU] | Freq: Two times a day (BID) | SUBCUTANEOUS | Status: DC

## 2013-06-05 MED ORDER — BIOTENE DRY MOUTH MT LIQD
15.0000 mL | Freq: Two times a day (BID) | OROMUCOSAL | Status: DC
  Administered 2013-06-05 – 2013-06-06 (×3): 15 mL via OROMUCOSAL

## 2013-06-05 MED ORDER — POTASSIUM CHLORIDE CRYS ER 20 MEQ PO TBCR
40.0000 meq | EXTENDED_RELEASE_TABLET | Freq: Once | ORAL | Status: AC
  Administered 2013-06-05: 40 meq via ORAL
  Filled 2013-06-05: qty 2

## 2013-06-05 MED ORDER — ONDANSETRON HCL 4 MG/2ML IJ SOLN
4.0000 mg | Freq: Once | INTRAMUSCULAR | Status: AC
  Administered 2013-06-05: 4 mg via INTRAVENOUS

## 2013-06-05 MED ORDER — CARVEDILOL 6.25 MG PO TABS
6.2500 mg | ORAL_TABLET | Freq: Two times a day (BID) | ORAL | Status: DC
  Administered 2013-06-05: 6.25 mg via ORAL
  Filled 2013-06-05 (×4): qty 1

## 2013-06-05 MED ORDER — INSULIN ASPART 100 UNIT/ML ~~LOC~~ SOLN: 10.0000 [IU] | Freq: Three times a day (TID) | SUBCUTANEOUS | Status: DC

## 2013-06-05 MED ORDER — INSULIN ASPART 100 UNIT/ML ~~LOC~~ SOLN
0.0000 [IU] | SUBCUTANEOUS | Status: DC
  Administered 2013-06-05: 11 [IU] via SUBCUTANEOUS
  Administered 2013-06-05: 3 [IU] via SUBCUTANEOUS
  Administered 2013-06-05: 8 [IU] via SUBCUTANEOUS

## 2013-06-05 MED ORDER — SODIUM CHLORIDE 0.9 % IV SOLN
INTRAVENOUS | Status: DC
  Administered 2013-06-05: 75 mL/h via INTRAVENOUS
  Administered 2013-06-06: 07:00:00 via INTRAVENOUS

## 2013-06-05 MED ORDER — CHLORHEXIDINE GLUCONATE 0.12 % MT SOLN
15.0000 mL | Freq: Two times a day (BID) | OROMUCOSAL | Status: DC
  Administered 2013-06-05 – 2013-06-06 (×2): 15 mL via OROMUCOSAL
  Filled 2013-06-05 (×5): qty 15

## 2013-06-05 NOTE — Progress Notes (Signed)
Pt's blood pressure has been trending up, most recent is 188/66.  Other than a slight headache, pt appears to be asymptomatic. Notified M. York PA of same, she advised giving her regular dose of atenolol 100mg  early.  This med has been requested from the pharmacy and will be given when available to the floor. Will continue to monitor.

## 2013-06-05 NOTE — Progress Notes (Signed)
[facility] Progress Note [place] TEAM 1 - Stepdown/ICU TEAM   FRANCIA VERRY EAV:[id number] DOB: [birth year] DOA: 06/04/2013 PCP: Roxy Manns, MD  Brief narrative: 72 year old obese female with history of diabetes mellitus type 2 on insulin (last A1c 8 about 2 months back as per patient), hypertension, hyperlipidemia, history of TIA who was brought in by her daughter for elevated blood glucose noted the morning of her admit. Patient had been feeling quite weak for almost 7-10 days. She had some [place] food then the next day she had 2 episodes of vomiting. Her blood glucose had been ranging from 250 to 300s for a few days. She checked her blood glucose the morning of her admit and it was reading >600.   Assessment/Plan:  DKA in DM2 Gap closed - bicarb normalized - pt has been transitioned off IV insulin   Uncontrolled DM2 CBg remains quite elevated - adjust tx and follow - dextrose IVF not stopped when insulin gtt stopped so I have now changed to NS IVF - very long discussion w/ pt and daughter on dangers of DKA and need for long term control of CBG - A1c pending  UTI Empiric abx - f/u culture as data available   HTN Poorly controlled at this time - adjust medical tx and follow   HLD LDL quite elevated - suspect pt not compliant w/ meds or diet at home - counseled on need for strict compliance  Hypothyroid  Continue home tx regimen - await f/u TSH  Hx of TIA  Systolic M 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  She c/o modest ongoing nausea w/o vomiting.  No cp, sob, f/c, or abdom pain.   Objective: Blood pressure 182/61, pulse 70, temperature 97.7 F (36.5 C), temperature source Oral, resp.  rate 18, height 5' (1.524 m), weight 105.8 kg (233 lb 4 oz), SpO2 94.00%.  Intake/Output Summary (Last 24 hours) at 06/05/13 1245 Last data filed at 06/05/13 1200  Gross per 24 hour  Intake 4136.25 ml  Output      0 ml  Net 4136.25 ml     Exam: General: No acute respiratory distress Lungs: Clear to auscultation bilaterally without wheezes or crackles Cardiovascular: Regular rate and rhythm - 2/6 holosystolic M - no gallup or rub Abdomen: obese, nontender, nondistended, soft, bowel sounds positive, no rebound, no ascites, no appreciable mass Extremities: No significant cyanosis, clubbing, or edema bilateral lower extremities  Data Reviewed: Basic Metabolic Panel:  Recent Labs Lab 06/04/13 1100 06/04/13 1503 06/04/13 1720 06/04/13 1812 06/04/13 2100  NA 122* 130* 134* 136 138  K 4.7 3.7 3.2* 3.3* 3.1*  CL 84* 93* 99 102 105  CO2 20 22 21 22 21   GLUCOSE 1082* 623* 450* 347* 177*  BUN 26* 24* 22 21 20   CREATININE 1.07 0.96 0.89 0.83 0.83  CALCIUM 9.2 9.1 8.6 8.5 8.5   Liver Function Tests:  Recent Labs Lab 06/04/13 1100  AST 10  ALT 18  ALKPHOS 68  BILITOT 0.6  PROT 6.7  ALBUMIN 3.4*   CBC:  Recent Labs Lab 06/04/13 1100  WBC 11.2*  NEUTROABS 8.1*  HGB 13.3  HCT 39.3  MCV 91.8  PLT 232   Cardiac Enzymes:  Recent Labs  Lab 06/04/13 1100  TROPONINI <0.30   CBG:  Recent Labs Lab 06/05/13 0140 06/05/13 0236 06/05/13 0357 06/05/13 0743 06/05/13 1218  GLUCAP 127* 142* 191* 285* 344*    Recent Results (from the past 240 hour(s))  MRSA PCR SCREENING     Status: None   Collection Time    06/04/13  2:38 PM      Result Value Range Status   MRSA by PCR NEGATIVE  NEGATIVE Final   Comment:            The GeneXpert MRSA Assay (FDA     approved for NASAL specimens     only), is one component of a     comprehensive MRSA colonization     surveillance program. It is not     intended to diagnose MRSA     infection nor to guide or     monitor  treatment for     MRSA infections.     Studies:  Recent x-ray studies have been reviewed in detail by the Attending Physician  Scheduled Meds:  Scheduled Meds: . amLODipine  5 mg Oral Daily   And  . irbesartan  300 mg Oral Daily  . antiseptic oral rinse  15 mL Mouth Rinse q12n4p  . aspirin EC  81 mg Oral Daily  . atenolol  100 mg Oral Daily  . cefTRIAXone (ROCEPHIN)  IV  1 g Intravenous Q24H  . chlorhexidine  15 mL Mouth Rinse BID  . enoxaparin (LOVENOX) injection  40 mg Subcutaneous Q24H  . ezetimibe  10 mg Oral Daily  . famotidine  10 mg Oral BID  . ferrous fumarate  1 tablet Oral Daily  . insulin aspart  0-15 Units Subcutaneous Q4H  . latanoprost  1 drop Both Eyes QHS  . levothyroxine  100 mcg Oral QAC breakfast  . liothyronine  25 mcg Oral BID  . omega-3 acid ethyl esters  1 g Oral Daily    Time spent on care of this patient: 35 mins   [facility] T  [facility] Office  [phone] Pager - Text Page per Loretha Stapler as per below:  On-Call/Text Page:      [email or web address]      password [email or web address]  If 7PM-7AM, please contact night-coverage [email or web address] Password [facility] 06/05/2013, 12:45 PM   LOS: 1 day

## 2013-06-05 NOTE — Progress Notes (Signed)
Transferred via wheelchair to 6N room 6. Pt is alert and oriented at time of transfer. Family present during transfer.

## 2013-06-05 NOTE — ED Provider Notes (Signed)
History/physical exam/procedure(s) were performed by non-physician practitioner and as supervising physician I was immediately available for consultation/collaboration. I have reviewed all notes and am in agreement with care and plan.   Tiearra Colwell S Cruz Bong, MD 06/05/13 1204 

## 2013-06-05 NOTE — Progress Notes (Signed)
Inpatient Diabetes Program Recommendations  AACE/ADA: New Consensus Statement on Inpatient Glycemic Control (2013)  Target Ranges:  Prepandial:   less than 140 mg/dL      Peak postprandial:   less than 180 mg/dL (1-2 hours)      Critically ill patients:  140 - 180 mg/dL   Reason for Visit: Results for LACIE, LANDRY (MRN [id number]) as of 06/05/2013 13:22  Ref. Range 06/05/2013 02:36 06/05/2013 03:57 06/05/2013 06:50 06/05/2013 07:43 06/05/2013 12:18  Glucose-Capillary Latest Range: 70-99 mg/dL 956 (H) 213 (H)  086 (H) 344 (H)   Note patent admitted with Lab glucose=1082 mg/dL.  She states that she was not feeling well and that she did not take her insulin.  Daughter is concerned that patient's CBG's always run 300 mg/dL or greater.  She see's Dr. Juleen China.  Daughter is interested in her Mother seeing a new doctor to manager her diabetes.  Told her to discuss with Dr. Sharon Seller.  Also discussed that patient likely needs insulin even when sick and that she needs plan with PCP on what to do on sick days. Discussed patient with Dr. Sharon Seller.  Would benefit from follow-up with outpatient CDE.  Will order per protocol.

## 2013-06-06 DIAGNOSIS — E785 Hyperlipidemia, unspecified: Secondary | ICD-10-CM

## 2013-06-06 DIAGNOSIS — N390 Urinary tract infection, site not specified: Secondary | ICD-10-CM | POA: Diagnosis present

## 2013-06-06 DIAGNOSIS — E0590 Thyrotoxicosis, unspecified without thyrotoxic crisis or storm: Secondary | ICD-10-CM

## 2013-06-06 LAB — GLUCOSE, CAPILLARY
Glucose-Capillary: 111 mg/dL — ABNORMAL HIGH (ref 70–99)
Glucose-Capillary: 56 mg/dL — ABNORMAL LOW (ref 70–99)
Glucose-Capillary: 152 mg/dL — ABNORMAL HIGH (ref 70–99)

## 2013-06-06 LAB — BASIC METABOLIC PANEL
CO2: 23 mEq/L (ref 19–32)
Chloride: 106 mEq/L (ref 96–112)
Glucose, Bld: 56 mg/dL — ABNORMAL LOW (ref 70–99)
Potassium: 4.1 mEq/L (ref 3.5–5.1)
Sodium: 141 mEq/L (ref 135–145)

## 2013-06-06 MED ORDER — LEVOTHYROXINE SODIUM 75 MCG PO TABS: 75.0000 ug | ORAL_TABLET | Freq: Every day | ORAL | Status: DC

## 2013-06-06 MED ORDER — SIMVASTATIN 20 MG PO TABS: 20.0000 mg | ORAL_TABLET | Freq: Every evening | ORAL | Status: DC

## 2013-06-06 MED ORDER — CARVEDILOL 12.5 MG PO TABS: 12.5000 mg | ORAL_TABLET | Freq: Two times a day (BID) | ORAL | Status: DC

## 2013-06-06 MED ORDER — LEVOTHYROXINE SODIUM 75 MCG PO TABS: 75.0000 ug | ORAL_TABLET | Freq: Every day | ORAL | Status: AC

## 2013-06-06 MED ORDER — AMLODIPINE BESYLATE-VALSARTAN 10-320 MG PO TABS: 1.0000 | ORAL_TABLET | Freq: Every day | ORAL | Status: DC

## 2013-06-06 MED ORDER — INSULIN ASPART 100 UNIT/ML ~~LOC~~ SOLN
10.0000 [IU] | Freq: Three times a day (TID) | SUBCUTANEOUS | Status: DC
Start: 2013-06-06 — End: 2013-06-06

## 2013-06-06 MED ORDER — DEXTROSE 50 % IV SOLN: 25.0000 mL | Freq: Once | INTRAVENOUS | Status: DC | PRN

## 2013-06-06 MED ORDER — INSULIN ASPART 100 UNIT/ML ~~LOC~~ SOLN: 10.0000 [IU] | Freq: Three times a day (TID) | SUBCUTANEOUS | Status: DC

## 2013-06-06 MED ORDER — DEXTROSE 50 % IV SOLN: 50.0000 mL | Freq: Once | INTRAVENOUS | Status: DC | PRN

## 2013-06-06 MED ORDER — INSULIN DETEMIR 100 UNIT/ML ~~LOC~~ SOLN: 45.0000 [IU] | Freq: Two times a day (BID) | SUBCUTANEOUS | Status: DC

## 2013-06-06 NOTE — Progress Notes (Signed)
Physical Therapy Evaluation Patient Details Name: Regina [name] MRN: [id number] DOB: [birth year] Today's Date: 06/06/2013 Time: 6578-4696 PT Time Calculation (min): 18 min  PT Assessment / Plan / Recommendation History of Present Illness  72 year old obese female with history of diabetes mellitus type 2 on insulin (last A1c 8 about 2 months back as per patient), hypertension, hyperlipidemia, history of TIA who was brought in by her daughter for elevated blood glucose noted the morning of her admit. Patient had been feeling quite weak for almost 7-10 days. She had some [place] food then the next day she had 2 episodes of vomiting. Her blood glucose had been ranging from 250 to 300s for a few days. She checked her blood glucose the morning of her admit and it was reading >600.    Clinical Impression  Pt admitted with the above. Pt currently with functional limitations due to the deficits listed below (see PT Problem List).  Pt will benefit from skilled PT to increase their independence and safety with mobility to allow discharge to the venue listed below.      PT Assessment  Patient needs continued PT services    Follow Up Recommendations  Home health PT;Supervision/Assistance - 24 hour    Equipment Recommendations  Other (comment) (may benefit from RW)    Frequency Min 3X/week    Precautions / Restrictions Precautions Precautions: Fall   Pertinent Vitals/Pain No c/o pain      Mobility  Bed Mobility Bed Mobility: Not assessed Transfers Transfers: Sit to Stand;Stand to Sit Sit to Stand: 4: Min assist;From chair/3-in-1 Stand to Sit: 4: Min guard;To chair/3-in-1 Details for Transfer Assistance: (A) to initiate transfer and slowly descend to recliner with cues for hand placement Ambulation/Gait Ambulation/Gait Assistance: 4: Min guard Ambulation Distance (Feet): 100 Feet Assistive device: Straight cane Ambulation/Gait Assistance Details: Minguard for safety with cues for upright  posture Gait Pattern: Step-through pattern;Decreased step length - left;Shuffle;Wide base of support Gait velocity: decreased Stairs: No    Exercises     PT Diagnosis: Difficulty walking;Generalized weakness  PT Problem List: Decreased strength;Decreased activity tolerance;Decreased balance;Decreased mobility;Decreased knowledge of use of DME;Obesity PT Treatment Interventions: DME instruction;Gait training;Stair training;Functional mobility training;Therapeutic activities;Therapeutic exercise;Balance training;Patient/family education     PT Goals(Current goals can be found in the care plan section) Acute Rehab PT Goals Patient Stated Goal: To return home PT Goal Formulation: With patient/family Time For Goal Achievement: 06/13/13 Potential to Achieve Goals: Good  Visit Information  Last PT Received On: 06/06/13 Assistance Needed: +1 History of Present Illness: 72 year old obese female with history of diabetes mellitus type 2 on insulin (last A1c 8 about 2 months back as per patient), hypertension, hyperlipidemia, history of TIA who was brought in by her daughter for elevated blood glucose noted the morning of her admit. Patient had been feeling quite weak for almost 7-10 days. She had some [place] food then the next day she had 2 episodes of vomiting. Her blood glucose had been ranging from 250 to 300s for a few days. She checked her blood glucose the morning of her admit and it was reading >600.         Prior Functioning  Home Living Family/patient expects to be discharged to:: Private residence Living Arrangements: Spouse/significant other Available Help at Discharge: Family Type of Home: House Home Access: Stairs to enter Secretary/administrator of Steps: 1 Entrance Stairs-Rails: None Home Layout: One level Home Equipment: Cane - single point;Walker - standard Prior Function Level of Independence: Independent with assistive  device(s) [facility]) Communication Communication: No  difficulties Dominant Hand: Right    Cognition  Cognition Arousal/Alertness: Awake/alert Behavior During Therapy: WFL for tasks assessed/performed Overall Cognitive Status: Impaired/Different from baseline Area of Impairment: Problem solving Problem Solving: Slow processing    Extremity/Trunk Assessment Lower Extremity Assessment Lower Extremity Assessment: Generalized weakness   Balance    End of Session PT - End of Session Equipment Utilized During Treatment: Gait belt Activity Tolerance: Patient tolerated treatment well Patient left: in chair;with call bell/phone within reach Nurse Communication: Mobility status  GP     Graycen Sadlon 06/06/2013, 2:22 PM   Jake Shark, PT DPT [phone]

## 2013-06-06 NOTE — Progress Notes (Addendum)
Inpatient Diabetes Program Recommendations  AACE/ADA: New Consensus Statement on Inpatient Glycemic Control (2013)  Target Ranges:  Prepandial:   less than 140 mg/dL      Peak postprandial:   less than 180 mg/dL (1-2 hours)      Critically ill patients:  140 - 180 mg/dL     Results for CYD, HOSTLER (MRN [id number]) as of 06/06/2013 10:17  Ref. Range 06/05/2013 07:43 06/05/2013 12:18 06/05/2013 17:06 06/05/2013 17:55 06/05/2013 21:38  Glucose-Capillary Latest Range: 70-99 mg/dL 191 (H) 478 (H) 295 (H) 326 (H) 250 (H)    Results for LEIMOMI, ZERVAS (MRN [id number]) as of 06/06/2013 10:17  Ref. Range 06/06/2013 06:52 06/06/2013 07:24  Glucose-Capillary Latest Range: 70-99 mg/dL 56 (L) 846 (H)    **Admitted with hyperglycemia (glucose 1082 mg/dl on admission).  **Home insulin and DM medications includes the following: NPH insulin- 60 units bid Novolog insulin- 20 units with breakfast/ 10 units with lunch/ 10 units with supper Glipizide 5 mg daily Metformin 1000 mg bid  **Noted DM Coordinator spoke with patient and her daughter yesterday.  Patient sees Dr. Juleen China with [facility] for DM management.  Noted patient's daughter is interested in having her mother follow-up with a different Endocrinology specialist.  **Note that patient had hypoglycemia this morning.  This hypoglycemia may likely have been caused by the large dose of Novolog insulin patient received at bedtime (12 units Novolog total).  **Patient started on Levemir and Novolog regimen yesterday.  Note that the current order for Novolog 10 units three times daily does not correspond with meal times.  Placed text page to Dr. Gonzella Lex asking for Novolog 10 units three times daily to be changed to Novolog 10 units tid with meals.  **MD- Please change Novolog 10 units three times daily to Novolog 10 units tid with meals  **MD- Please also consider discontinuing Glipizide from patient's home regimen.  I doubt this oral  medication is having a profound impact on her glucose control since she is already taking insulin with meals at home.  Will follow. Ambrose Finland RN, MSN, CDE Diabetes Coordinator Inpatient Diabetes Program [phone]

## 2013-06-06 NOTE — Progress Notes (Signed)
Hypoglycemic Event  CBG: 56  Treatment: D50 IV 25 mL  Symptoms: Pale, Sweaty, Shaky, Hungry and Nervous/irritable  Follow-up CBG: Time: 07:25 CBG Result:111  Possible Reasons for Event: Inadequate meal intake and Medication regimen: insulin  Comments/MD notified:    Nancye Grumbine R  Remember to initiate Hypoglycemia Order Set & complete

## 2013-06-06 NOTE — Progress Notes (Signed)
Pt discharged to home

## 2013-06-06 NOTE — Discharge Summary (Signed)
Physician Discharge Summary  Regina [name] ZOX:[id number] DOB: 13-Jun-1941 DOA: 06/04/2013  PCP: Roxy Manns, MD  Admit date: 06/04/2013 Discharge date: 06/06/2013  Time spent: 40 minutes  Recommendations for Outpatient Follow-up:  1. Discharged home with outpatient PCP followup. Needs BP and fsg monitoring 2. Followup scheduled with endocrinology (Dr. Elvera Lennox ) on 07/04/2013 at 9 AM.  Discharge Diagnoses:  Principal Problem:    DKA (diabetic ketoacidoses)  Active Problems:   HYPERTENSION   HYPOTHYROIDISM   DIABETES MELLITUS, TYPE II   DIABETIC  RETINOPATHY   HYPERLIPIDEMIA   ANXIETY   DEPRESSION   Discharge Condition: Fair  Diet recommendation: Diabetic  Filed Weights   06/05/13 0356  Weight: 105.8 kg (233 lb 4 oz)    History of present illness:  Please refer to admission H&P for details but in brief 72 year old obese female with history of diabetes mellitus type 2 on insulin (last A1c 8 about 2 months back as per patient), hypertension, hyperlipidemia, history of TIA who was brought in by her daughter for elevated blood glucose noted the morning of her admit. Patient had been feeling quite weak for almost 7-10 days. She had some [place] food then the next day she had 2 episodes of vomiting. Her blood glucose had been ranging from 250 to 300s for a few days. She checked her blood glucose the morning of her admit and it was reading >600.  Workup in the ED showed patient to be in DKA. She was admitted to step down and started on insulin drip.   Hospital Course:  DKA in DM2  Placed on insulin drip.Gap closed by late in the evening. Bicarbonate was normal. Patient taken off IV insulin drip and started on Levemir . Hemoglobin A1c was 9.4.  Uncontrolled DM2  Patient had a DVT CBG on several occasions he did Levemir does was increased to 45 units twice a day and added premeal  NovoLog 10 units 3 times a day. Patient had low fsg this am as she was getting novolog with meals and was  adjusted.  She doesnot have any symptoms. She will be discharged home on this regimen she did she would continue her home dose of metformin but I will discontinue her glipizide. I have asked an appointment with endocrinologist Dr. Elvera Lennox for next month. She needs to follow up with her PCP in one week he I have called her daughter and updated about the plan pediatrics   HTN  Poorly controlled . I will increase her amlodipine dose to 10 mg. Continue home dose of sacrum. Added low-dose carvedilol  as well. Needs monitoring as outpt  Hyperlipidemia LDL quite elevated - suspect pt not compliant w/ meds or diet at home . counseled on need for strict compliance . [facility] on [facility] PT [facility] have added low-dose Zocor as well.  Hypothyroid  Low TSH  Noted. Patient on both levothyroxine and leiothyroxine. Synthroid dose reduced to 75 mcg  Hx of TIA  Continue ASA. Added zocor  Systolic Murmur Likely related to moderate AS noted on echo May 2014 . Follows with [facility] as outpt  Code Status: FULL  Family Communication: spoke with daughter over the phone Disposition Plan: home withn outpt follow up  Consultants:  none  Procedures:  none  Antibiotics:  Rocephin 8/24 >> 8/26    Discharge Exam: Filed Vitals:   06/06/13 1030  BP: 174/97  Pulse: 80  Temp: 98.4 F (36.9 C)  Resp: 20    General: Fairly female  in no acute distress HEENT: No pallor, moist oral mucosa Chest: Clear to auscultation bilaterally, no added sounds CVS: Normal S1-S2, no murmurs rub or gallop Abdomen: Soft, nontender, nondistended, bowel sounds present Extremities: Warm, no edema CNS: AAO x3, non focal   Discharge Instructions  Discharge Orders   Future Appointments Provider Department Dept Phone   07/04/2013 9:00 AM Carlus Pavlov, MD [facility] PRIMARY CARE ENDOCRINOLOGY [phone]   Future Orders Complete By Expires   Ambulatory referral to Nutrition and Diabetic Education  As directed     Scheduling Instructions:     Needs 1:1 with diabetes Educator to review diet, sick day rules, etc.       Medication List    STOP taking these medications       amLODipine-valsartan 5-320 MG per tablet  Commonly known as:  EXFORGE  Replaced by:  amLODipine-valsartan 10-320 MG per tablet     glipiZIDE 5 MG tablet  Commonly known as:  GLUCOTROL     insulin NPH 100 UNIT/ML injection  Commonly known as:  HUMULIN N,NOVOLIN N      TAKE these medications       amLODipine-valsartan 10-320 MG per tablet  Commonly known as:  EXFORGE  Take 1 tablet by mouth daily.     aspirin EC 81 MG tablet  Take 81 mg by mouth daily.     atenolol 100 MG tablet  Commonly known as:  TENORMIN  Take 100 mg by mouth daily.     CALCIUM 1200 PO  Take 1 tablet by mouth daily.     carvedilol 12.5 MG tablet  Commonly known as:  COREG  Take 1 tablet (12.5 mg total) by mouth 2 (two) times daily with a meal.     ezetimibe 10 MG tablet  Commonly known as:  ZETIA  Take 10 mg by mouth daily.     ferrous fumarate 325 (106 FE) MG Tabs tablet  Commonly known as:  HEMOCYTE - 106 mg FE  Take 1 tablet by mouth daily.     Fish Oil 1000 MG Caps  Take 2,000 mg by mouth daily.     insulin aspart 100 UNIT/ML injection  Commonly known as:  novoLOG  Inject 10 Units into the skin 3 (three) times daily before meals. 20 units in the am, 10 units at lunch and 10 units at night     insulin detemir 100 UNIT/ML injection  Commonly known as:  LEVEMIR  Inject 0.45 mLs (45 Units total) into the skin 2 (two) times daily.     levothyroxine 75 MCG tablet  Commonly known as:  SYNTHROID, LEVOTHROID  Take 1 tablet (75 mcg total) by mouth daily before breakfast.  Start taking on:  06/07/2013     liothyronine 25 MCG tablet  Commonly known as:  CYTOMEL  Take 25 mcg by mouth 2 (two) times daily.     metFORMIN 500 MG tablet  Commonly known as:  GLUCOPHAGE  Take 1,000 mg by mouth 2 (two) times daily.     ranitidine 75 MG  tablet  Commonly known as:  ZANTAC  Take 75 mg by mouth 2 (two) times daily.     Travoprost (BAK Free) 0.004 % Soln ophthalmic solution  Commonly known as:  TRAVATAN  Place 1 drop into both eyes at bedtime.    Tab Zocor 20 mg  1 tablet daily at bedtime   Allergies  Allergen Reactions  . Amoxicillin     REACTION: rash  . Ciprofloxacin  REACTION: hives  . Codeine     REACTION: nervous, shaky  . Duloxetine     REACTION: reaction not known  . Hydrochlorothiazide W-Triamterene     REACTION: hyponatremia  . Ticlopidine Hcl     REACTION: bleeding       Follow-up Information   Follow up with Carlus Pavlov, MD On 07/04/2013. (9 am . please arrive by 8:45 am)    Specialty:  Internal Medicine   Contact information:   [address] [place] [address] [phone]       Follow up with Roxy Manns, MD In 1 week.   Specialties:  Family Medicine, Radiology   Contact information:   [address] [address] [place] [place] [address] [phone]        The results of significant diagnostics from this hospitalization (including imaging, microbiology, ancillary and laboratory) are listed below for reference.    Significant Diagnostic Studies: Dg Chest 2 View  06/04/2013   *RADIOLOGY REPORT*  Clinical Data: Hyperglycemia.  CHEST - 2 VIEW  Comparison: Two-view chest x-ray 12/20/2012, 08/07/2010 [facility].  Findings: Cardiac silhouette moderately enlarged but stable, allowing for differences in technique.  Thoracic aorta mildly atherosclerotic, unchanged.  Hilar and mediastinal contours otherwise unremarkable.  Lungs clear.  Bronchovascular markings normal.  Pulmonary vascularity normal.  No pneumothorax.  No pleural effusions.  Degenerative changes and DISH involving the thoracic spine.  No significant interval change.  IMPRESSION: Cardiomegaly.  No acute cardiopulmonary disease.  Stable examination.   Original Report Authenticated  By: Hulan Saas, M.D.    Microbiology: Recent Results (from the past 240 hour(s))  URINE CULTURE     Status: None   Collection Time    06/04/13  1:36 PM      Result Value Range Status   Specimen Description URINE, RANDOM   Final   Special Requests NONE   Final   Culture  Setup Time     Final   Value: 06/04/2013 23:17     Performed at [facility] Count     Final   Value: 30,000 COLONIES/ML     Performed at [facility]   Culture     Final   Value: Multiple bacterial morphotypes present, none predominant. Suggest appropriate recollection if clinically indicated.     Performed at [facility]   Report Status 06/05/2013 FINAL   Final  MRSA PCR SCREENING     Status: None   Collection Time    06/04/13  2:38 PM      Result Value Range Status   MRSA by PCR NEGATIVE  NEGATIVE Final   Comment:            The GeneXpert MRSA Assay (FDA     approved for NASAL specimens     only), is one component of a     comprehensive MRSA colonization     surveillance program. It is not     intended to diagnose MRSA     infection nor to guide or     monitor treatment for     MRSA infections.     Labs: Basic Metabolic Panel:  Recent Labs Lab 06/04/13 1503 06/04/13 1720 06/04/13 1812 06/04/13 2100 06/06/13 0517  NA 130* 134* 136 138 141  K 3.7 3.2* 3.3* 3.1* 4.1  CL 93* 99 102 105 106  CO2 22 21 22 21 23   GLUCOSE 623* 450* 347* 177* 56*  BUN 24*  22 21 20 9   CREATININE 0.96 0.89 0.83 0.83 0.67  CALCIUM 9.1 8.6 8.5 8.5 9.3   Liver Function Tests:  Recent Labs Lab 06/04/13 1100  AST 10  ALT 18  ALKPHOS 68  BILITOT 0.6  PROT 6.7  ALBUMIN 3.4*   No results found for this basename: LIPASE, AMYLASE,  in the last 168 hours No results found for this basename: AMMONIA,  in the last 168 hours CBC:  Recent Labs Lab 06/04/13 1100  WBC 11.2*  NEUTROABS 8.1*  HGB 13.3  HCT 39.3  MCV 91.8  PLT 232   Cardiac Enzymes:  Recent Labs Lab  06/04/13 1100  TROPONINI <0.30   BNP: BNP (last 3 results) No results found for this basename: PROBNP,  in the last 8760 hours CBG:  Recent Labs Lab 06/05/13 1706 06/05/13 1755 06/05/13 2138 06/06/13 0652 06/06/13 0724  GLUCAP 370* 326* 250* 56* 111*       Signed:  Piera Downs  [facility] 06/06/2013, 11:09 AM

## 2013-06-07 ENCOUNTER — Encounter (HOSPITAL_COMMUNITY): Payer: Self-pay | Admitting: Emergency Medicine

## 2013-06-07 ENCOUNTER — Emergency Department (HOSPITAL_COMMUNITY): Payer: Medicare Other

## 2013-06-07 ENCOUNTER — Observation Stay (HOSPITAL_COMMUNITY)
Admission: EM | Admit: 2013-06-07 | Discharge: 2013-06-09 | Disposition: A | Discharge status: Skilled Nursing Facility | Payer: Medicare Other | Attending: Internal Medicine | Admitting: Internal Medicine

## 2013-06-07 ENCOUNTER — Inpatient Hospital Stay (HOSPITAL_COMMUNITY): Payer: Medicare Other

## 2013-06-07 DIAGNOSIS — Z87891 Personal history of nicotine dependence: Secondary | ICD-10-CM | POA: Insufficient documentation

## 2013-06-07 DIAGNOSIS — R011 Cardiac murmur, unspecified: Secondary | ICD-10-CM | POA: Insufficient documentation

## 2013-06-07 DIAGNOSIS — E1110 Type 2 diabetes mellitus with ketoacidosis without coma: Secondary | ICD-10-CM

## 2013-06-07 DIAGNOSIS — E11319 Type 2 diabetes mellitus with unspecified diabetic retinopathy without macular edema: Secondary | ICD-10-CM | POA: Insufficient documentation

## 2013-06-07 DIAGNOSIS — R4182 Altered mental status, unspecified: Secondary | ICD-10-CM | POA: Diagnosis present

## 2013-06-07 DIAGNOSIS — F329 Major depressive disorder, single episode, unspecified: Secondary | ICD-10-CM

## 2013-06-07 DIAGNOSIS — K219 Gastro-esophageal reflux disease without esophagitis: Secondary | ICD-10-CM | POA: Insufficient documentation

## 2013-06-07 DIAGNOSIS — E0590 Thyrotoxicosis, unspecified without thyrotoxic crisis or storm: Secondary | ICD-10-CM

## 2013-06-07 DIAGNOSIS — I639 Cerebral infarction, unspecified: Secondary | ICD-10-CM

## 2013-06-07 DIAGNOSIS — E119 Type 2 diabetes mellitus without complications: Secondary | ICD-10-CM

## 2013-06-07 DIAGNOSIS — I6350 Cerebral infarction due to unspecified occlusion or stenosis of unspecified cerebral artery: Principal | ICD-10-CM | POA: Insufficient documentation

## 2013-06-07 DIAGNOSIS — E1165 Type 2 diabetes mellitus with hyperglycemia: Secondary | ICD-10-CM | POA: Insufficient documentation

## 2013-06-07 DIAGNOSIS — E1139 Type 2 diabetes mellitus with other diabetic ophthalmic complication: Secondary | ICD-10-CM

## 2013-06-07 DIAGNOSIS — I10 Essential (primary) hypertension: Secondary | ICD-10-CM | POA: Diagnosis present

## 2013-06-07 DIAGNOSIS — Z8544 Personal history of malignant neoplasm of other female genital organs: Secondary | ICD-10-CM | POA: Insufficient documentation

## 2013-06-07 DIAGNOSIS — D509 Iron deficiency anemia, unspecified: Secondary | ICD-10-CM

## 2013-06-07 DIAGNOSIS — F411 Generalized anxiety disorder: Secondary | ICD-10-CM

## 2013-06-07 DIAGNOSIS — R413 Other amnesia: Secondary | ICD-10-CM

## 2013-06-07 DIAGNOSIS — R410 Disorientation, unspecified: Secondary | ICD-10-CM

## 2013-06-07 DIAGNOSIS — E039 Hypothyroidism, unspecified: Secondary | ICD-10-CM | POA: Diagnosis present

## 2013-06-07 DIAGNOSIS — Z8673 Personal history of transient ischemic attack (TIA), and cerebral infarction without residual deficits: Secondary | ICD-10-CM | POA: Insufficient documentation

## 2013-06-07 DIAGNOSIS — H548 Legal blindness, as defined in USA: Secondary | ICD-10-CM | POA: Insufficient documentation

## 2013-06-07 DIAGNOSIS — Z8679 Personal history of other diseases of the circulatory system: Secondary | ICD-10-CM

## 2013-06-07 DIAGNOSIS — Z794 Long term (current) use of insulin: Secondary | ICD-10-CM | POA: Insufficient documentation

## 2013-06-07 DIAGNOSIS — E785 Hyperlipidemia, unspecified: Secondary | ICD-10-CM

## 2013-06-07 DIAGNOSIS — N390 Urinary tract infection, site not specified: Secondary | ICD-10-CM

## 2013-06-07 DIAGNOSIS — Z7982 Long term (current) use of aspirin: Secondary | ICD-10-CM | POA: Insufficient documentation

## 2013-06-07 HISTORY — DX: Type 2 diabetes mellitus without complications: E11.9

## 2013-06-07 HISTORY — DX: Malignant neoplasm of vulva, unspecified: C51.9

## 2013-06-07 HISTORY — DX: Hypothyroidism, unspecified: E03.9

## 2013-06-07 HISTORY — DX: Cardiac murmur, unspecified: R01.1

## 2013-06-07 HISTORY — DX: Legal blindness, as defined in USA: H54.8

## 2013-06-07 HISTORY — DX: Altered mental status, unspecified: R41.82

## 2013-06-07 HISTORY — DX: Gastro-esophageal reflux disease without esophagitis: K21.9

## 2013-06-07 HISTORY — DX: Depression, unspecified: F32.A

## 2013-06-07 HISTORY — DX: Anemia, unspecified: D64.9

## 2013-06-07 HISTORY — DX: Unspecified osteoarthritis, unspecified site: M19.90

## 2013-06-07 HISTORY — DX: Major depressive disorder, single episode, unspecified: F32.9

## 2013-06-07 LAB — COMPREHENSIVE METABOLIC PANEL
ALT: 19 U/L (ref 0–35)
AST: 22 U/L (ref 0–37)
Albumin: 3.2 g/dL — ABNORMAL LOW (ref 3.5–5.2)
CO2: 22 mEq/L (ref 19–32)
Chloride: 100 mEq/L (ref 96–112)
Creatinine, Ser: 0.76 mg/dL (ref 0.50–1.10)
Sodium: 135 mEq/L (ref 135–145)
Total Bilirubin: 0.5 mg/dL (ref 0.3–1.2)

## 2013-06-07 LAB — POCT I-STAT TROPONIN I: Troponin i, poc: 0.05 ng/mL (ref 0.00–0.08)

## 2013-06-07 LAB — URINE MICROSCOPIC-ADD ON

## 2013-06-07 LAB — CREATININE, SERUM
Creatinine, Ser: 0.62 mg/dL (ref 0.50–1.10)
GFR calc Af Amer: >90 mL/min (ref >90)
GFR calc non Af Amer: 89 mL/min — ABNORMAL LOW (ref >90)

## 2013-06-07 LAB — CBC WITH DIFFERENTIAL/PLATELET
Basophils Absolute: 0 10*3/uL (ref 0.0–0.1)
Basophils Relative: 0 % (ref 0–1)
Lymphocytes Relative: 19 % (ref 12–46)
MCHC: 35.2 g/dL (ref 30.0–36.0)
Monocytes Absolute: 0.8 10*3/uL (ref 0.1–1.0)
Neutro Abs: 5.8 10*3/uL (ref 1.7–7.7)
Neutrophils Relative %: 71 % (ref 43–77)
Platelets: 202 10*3/uL (ref 150–400)
RDW: 13.5 % (ref 11.5–15.5)
WBC: 8.1 10*3/uL (ref 4.0–10.5)

## 2013-06-07 LAB — URINALYSIS, ROUTINE W REFLEX MICROSCOPIC
Bilirubin Urine: NEGATIVE
Glucose, UA: >1000 mg/dL — AB
Protein, ur: 100 mg/dL — AB
Specific Gravity, Urine: 1.023 (ref 1.005–1.030)

## 2013-06-07 LAB — CBC
MCHC: 35.4 g/dL (ref 30.0–36.0)
Platelets: 177 10*3/uL (ref 150–400)
RDW: 13.6 % (ref 11.5–15.5)
WBC: 7.5 10*3/uL (ref 4.0–10.5)

## 2013-06-07 LAB — GLUCOSE, CAPILLARY: Glucose-Capillary: 342 mg/dL — ABNORMAL HIGH (ref 70–99)

## 2013-06-07 MED ORDER — FAMOTIDINE 20 MG PO TABS
20.0000 mg | ORAL_TABLET | Freq: Two times a day (BID) | ORAL | Status: DC
  Administered 2013-06-07 – 2013-06-09 (×4): 20 mg via ORAL
  Filled 2013-06-07 (×5): qty 1

## 2013-06-07 MED ORDER — ACETAMINOPHEN 650 MG RE SUPP: 650.0000 mg | Freq: Four times a day (QID) | RECTAL | Status: DC | PRN

## 2013-06-07 MED ORDER — INSULIN DETEMIR 100 UNIT/ML ~~LOC~~ SOLN
45.0000 [IU] | Freq: Two times a day (BID) | SUBCUTANEOUS | Status: DC
  Administered 2013-06-07 – 2013-06-09 (×4): 45 [IU] via SUBCUTANEOUS
  Filled 2013-06-07 (×5): qty 0.45

## 2013-06-07 MED ORDER — ACETAMINOPHEN 325 MG PO TABS: 650.0000 mg | ORAL_TABLET | Freq: Four times a day (QID) | ORAL | Status: DC | PRN

## 2013-06-07 MED ORDER — HEPARIN SODIUM (PORCINE) 5000 UNIT/ML IJ SOLN
5000.0000 [IU] | Freq: Three times a day (TID) | INTRAMUSCULAR | Status: DC
  Administered 2013-06-07 – 2013-06-09 (×7): 5000 [IU] via SUBCUTANEOUS
  Filled 2013-06-07 (×8): qty 1

## 2013-06-07 MED ORDER — INSULIN ASPART 100 UNIT/ML ~~LOC~~ SOLN
0.0000 [IU] | Freq: Three times a day (TID) | SUBCUTANEOUS | Status: DC
  Administered 2013-06-07: 9 [IU] via SUBCUTANEOUS
  Administered 2013-06-08: 3 [IU] via SUBCUTANEOUS
  Administered 2013-06-08: 5 [IU] via SUBCUTANEOUS
  Administered 2013-06-08: 3 [IU] via SUBCUTANEOUS
  Administered 2013-06-09: 7 [IU] via SUBCUTANEOUS
  Administered 2013-06-09: 5 [IU] via SUBCUTANEOUS

## 2013-06-07 MED ORDER — SIMVASTATIN 20 MG PO TABS
20.0000 mg | ORAL_TABLET | Freq: Every evening | ORAL | Status: DC
Start: 2013-06-07 — End: 2013-06-09
  Administered 2013-06-07 – 2013-06-08 (×2): 20 mg via ORAL
  Filled 2013-06-07 (×3): qty 1

## 2013-06-07 MED ORDER — ASPIRIN EC 81 MG PO TBEC
81.0000 mg | DELAYED_RELEASE_TABLET | Freq: Every day | ORAL | Status: DC
  Administered 2013-06-07 – 2013-06-08 (×2): 81 mg via ORAL
  Filled 2013-06-07 (×3): qty 1

## 2013-06-07 MED ORDER — ATENOLOL 100 MG PO TABS
100.0000 mg | ORAL_TABLET | Freq: Every day | ORAL | Status: DC
  Administered 2013-06-07: 100 mg via ORAL
  Filled 2013-06-07 (×2): qty 1

## 2013-06-07 MED ORDER — SODIUM CHLORIDE 0.9 % IJ SOLN: 3.0000 mL | INTRAMUSCULAR | Status: DC | PRN

## 2013-06-07 MED ORDER — SODIUM CHLORIDE 0.9 % IV BOLUS (SEPSIS)
1000.0000 mL | Freq: Once | INTRAVENOUS | Status: AC
  Administered 2013-06-07: 1000 mL via INTRAVENOUS

## 2013-06-07 MED ORDER — EZETIMIBE 10 MG PO TABS
10.0000 mg | ORAL_TABLET | Freq: Every day | ORAL | Status: DC
  Administered 2013-06-07 – 2013-06-09 (×3): 10 mg via ORAL
  Filled 2013-06-07 (×3): qty 1

## 2013-06-07 MED ORDER — LIOTHYRONINE SODIUM 25 MCG PO TABS
25.0000 ug | ORAL_TABLET | Freq: Two times a day (BID) | ORAL | Status: DC
  Administered 2013-06-07 – 2013-06-09 (×4): 25 ug via ORAL
  Filled 2013-06-07 (×5): qty 1

## 2013-06-07 MED ORDER — CARVEDILOL 12.5 MG PO TABS
12.5000 mg | ORAL_TABLET | Freq: Two times a day (BID) | ORAL | Status: DC
  Administered 2013-06-07 – 2013-06-08 (×2): 12.5 mg via ORAL
  Filled 2013-06-07 (×4): qty 1

## 2013-06-07 MED ORDER — LEVOTHYROXINE SODIUM 75 MCG PO TABS
75.0000 ug | ORAL_TABLET | Freq: Every day | ORAL | Status: DC
  Administered 2013-06-08 – 2013-06-09 (×2): 75 ug via ORAL
  Filled 2013-06-07 (×3): qty 1

## 2013-06-07 MED ORDER — LATANOPROST 0.005 % OP SOLN
1.0000 [drp] | Freq: Every day | OPHTHALMIC | Status: DC
  Administered 2013-06-07 – 2013-06-08 (×2): 1 [drp] via OPHTHALMIC
  Filled 2013-06-07: qty 2.5

## 2013-06-07 MED ORDER — ONDANSETRON HCL 4 MG PO TABS: 4.0000 mg | ORAL_TABLET | Freq: Four times a day (QID) | ORAL | Status: DC | PRN

## 2013-06-07 MED ORDER — OMEGA-3-ACID ETHYL ESTERS 1 G PO CAPS
1.0000 g | ORAL_CAPSULE | Freq: Every day | ORAL | Status: DC
  Administered 2013-06-07 – 2013-06-09 (×3): 1 g via ORAL
  Filled 2013-06-07 (×3): qty 1

## 2013-06-07 MED ORDER — FERROUS FUMARATE 325 (106 FE) MG PO TABS
1.0000 | ORAL_TABLET | Freq: Every day | ORAL | Status: DC
  Administered 2013-06-07 – 2013-06-09 (×3): 106 mg via ORAL
  Filled 2013-06-07 (×3): qty 1

## 2013-06-07 MED ORDER — AMLODIPINE BESYLATE-VALSARTAN 10-320 MG PO TABS: 1.0000 | ORAL_TABLET | Freq: Every day | ORAL | Status: DC

## 2013-06-07 MED ORDER — SODIUM CHLORIDE 0.9 % IV SOLN: 250.0000 mL | INTRAVENOUS | Status: DC | PRN

## 2013-06-07 MED ORDER — SODIUM CHLORIDE 0.9 % IJ SOLN
3.0000 mL | Freq: Two times a day (BID) | INTRAMUSCULAR | Status: DC
  Administered 2013-06-07 – 2013-06-09 (×4): 3 mL via INTRAVENOUS

## 2013-06-07 MED ORDER — INSULIN ASPART 100 UNIT/ML ~~LOC~~ SOLN
6.0000 [IU] | Freq: Three times a day (TID) | SUBCUTANEOUS | Status: DC
  Administered 2013-06-07 – 2013-06-08 (×2): 6 [IU] via SUBCUTANEOUS
  Administered 2013-06-08: 08:00:00 via SUBCUTANEOUS
  Administered 2013-06-08 – 2013-06-09 (×3): 6 [IU] via SUBCUTANEOUS

## 2013-06-07 MED ORDER — INSULIN ASPART 100 UNIT/ML ~~LOC~~ SOLN
0.0000 [IU] | Freq: Every day | SUBCUTANEOUS | Status: DC
  Administered 2013-06-07: 4 [IU] via SUBCUTANEOUS
  Administered 2013-06-08: 2 [IU] via SUBCUTANEOUS

## 2013-06-07 MED ORDER — AMLODIPINE BESYLATE 10 MG PO TABS
10.0000 mg | ORAL_TABLET | Freq: Every day | ORAL | Status: DC
  Administered 2013-06-07 – 2013-06-09 (×3): 10 mg via ORAL
  Filled 2013-06-07 (×3): qty 1

## 2013-06-07 MED ORDER — IRBESARTAN 300 MG PO TABS
300.0000 mg | ORAL_TABLET | Freq: Every day | ORAL | Status: DC
  Administered 2013-06-07 – 2013-06-09 (×3): 300 mg via ORAL
  Filled 2013-06-07 (×3): qty 1

## 2013-06-07 MED ORDER — ONDANSETRON HCL 4 MG/2ML IJ SOLN: 4.0000 mg | Freq: Four times a day (QID) | INTRAMUSCULAR | Status: DC | PRN

## 2013-06-07 NOTE — ED Notes (Signed)
Lab called and questioned about UA order for pt and states order was not in computer; incorrect UA order was d/c. Sent down correct order for lab.

## 2013-06-07 NOTE — ED Notes (Signed)
Notified RN CBG 363

## 2013-06-07 NOTE — ED Provider Notes (Signed)
CSN: [id number]     Arrival date & time 06/07/13  6962 History   First MD Initiated Contact with Patient 06/07/13 0920     Chief Complaint  Patient presents with  . Altered Mental Status   (Consider location/radiation/quality/duration/timing/severity/associated sxs/prior Treatment) HPI Comments: Patient presents to the emergency department with chief complaints of altered mental status. She is brought in by her daughter, who states that she found the patient this morning lying in the bushes outside of her home. The patient nor the daughter is certain how the patient got better. Patient was recently released from the hospital yesterday following an admission for DKA. She denies being in any pain at this time, but states that she feels weak and tired. Patient is currently alert and oriented.  The history is provided by the patient. No language interpreter was used.    Past Medical History  Diagnosis Date  . Diabetes mellitus without complication   . Hypertension   . Hypercholesteremia   . Blind left eye   . TIA (transient ischemic attack)    History reviewed. No pertinent past surgical history. History reviewed. No pertinent family history. History  Substance Use Topics  . Smoking status: Former Smoker    Quit date: 10/12/1992  . Smokeless tobacco: Never Used  . Alcohol Use: No   OB History   Grav Para Term Preterm Abortions TAB SAB Ect Mult Living                 Review of Systems  All other systems reviewed and are negative.    Allergies  Amoxicillin; Ciprofloxacin; Codeine; Duloxetine; Hydrochlorothiazide w-triamterene; and Ticlopidine hcl  Home Medications   Current Outpatient Rx  Name  Route  Sig  Dispense  Refill  . amLODipine-valsartan (EXFORGE) 10-320 MG per tablet   Oral   Take 1 tablet by mouth daily.   30 tablet   0   . aspirin EC 81 MG tablet   Oral   Take 81 mg by mouth daily.         Marland Kitchen atenolol (TENORMIN) 100 MG tablet   Oral   Take 100 mg by  mouth daily.         . Calcium Carbonate-Vit D-Min (CALCIUM 1200 PO)   Oral   Take 1 tablet by mouth daily.         . carvedilol (COREG) 12.5 MG tablet   Oral   Take 1 tablet (12.5 mg total) by mouth 2 (two) times daily with a meal.   60 tablet   0   . ezetimibe (ZETIA) 10 MG tablet   Oral   Take 10 mg by mouth daily.         . ferrous fumarate (HEMOCYTE - 106 MG FE) 325 (106 FE) MG TABS tablet   Oral   Take 1 tablet by mouth daily.         . insulin aspart (NOVOLOG) 100 UNIT/ML injection   Subcutaneous   Inject 10 Units into the skin 3 (three) times daily before meals. 20 units in the am, 10 units at lunch and 10 units at night   1 vial   0   . insulin detemir (LEVEMIR) 100 UNIT/ML injection   Subcutaneous   Inject 0.45 mLs (45 Units total) into the skin 2 (two) times daily.   10 mL   3   . levothyroxine (SYNTHROID, LEVOTHROID) 75 MCG tablet   Oral   Take 1 tablet (75 mcg total) by mouth daily  before breakfast.   30 tablet   0   . liothyronine (CYTOMEL) 25 MCG tablet   Oral   Take 25 mcg by mouth 2 (two) times daily.         . metFORMIN (GLUCOPHAGE) 500 MG tablet   Oral   Take 1,000 mg by mouth 2 (two) times daily.         . Omega-3 Fatty Acids (FISH OIL) 1000 MG CAPS   Oral   Take 2,000 mg by mouth daily.         . ranitidine (ZANTAC) 75 MG tablet   Oral   Take 75 mg by mouth 2 (two) times daily.         . simvastatin (ZOCOR) 20 MG tablet   Oral   Take 1 tablet (20 mg total) by mouth every evening.   30 tablet   0   . Travoprost, BAK Free, (TRAVATAN) 0.004 % SOLN ophthalmic solution   Both Eyes   Place 1 drop into both eyes at bedtime.          BP 179/83  Pulse 99  Temp(Src) 97.9 F (36.6 C) (Oral)  Resp 21  SpO2 96% Physical Exam  Nursing note and vitals reviewed. Constitutional: She is oriented to person, place, and time. She appears well-developed and well-nourished. No distress.  HENT:  Head: Normocephalic and  atraumatic.  Eyes: Conjunctivae and EOM are normal. Pupils are equal, round, and reactive to light.  Neck: Normal range of motion. Neck supple.  Cardiovascular: Normal rate and regular rhythm.  Exam reveals no gallop and no friction rub.   Murmur heard. Pulmonary/Chest: Effort normal and breath sounds normal. No respiratory distress. She has no wheezes. She has no rales. She exhibits no tenderness.  Abdominal: Soft. Bowel sounds are normal. She exhibits no distension and no mass. There is no tenderness. There is no rebound and no guarding.  Musculoskeletal: Normal range of motion. She exhibits no edema and no tenderness.  Neurological: She is alert and oriented to person, place, and time.  Skin: Skin is warm and dry.  Psychiatric: She has a normal mood and affect. Her behavior is normal. Judgment and thought content normal.    ED Course  Procedures (including critical care time) Labs Review Labs Reviewed  URINALYSIS, ROUTINE W REFLEX MICROSCOPIC  CBC WITH DIFFERENTIAL  COMPREHENSIVE METABOLIC PANEL  URINALYSIS, ROUTINE W REFLEX MICROSCOPIC   Results for orders placed during the hospital encounter of 06/07/13  CBC WITH DIFFERENTIAL      Result Value Range   WBC 8.1  4.0 - 10.5 K/uL   RBC 4.74  3.87 - 5.11 MIL/uL   Hemoglobin 14.6  12.0 - 15.0 g/dL   HCT 16.1  09.6 - 04.5 %   MCV 87.6  78.0 - 100.0 fL   MCH 30.8  26.0 - 34.0 pg   MCHC 35.2  30.0 - 36.0 g/dL   RDW 40.9  81.1 - 91.4 %   Platelets 202  150 - 400 K/uL   Neutrophils Relative % 71  43 - 77 %   Neutro Abs 5.8  1.7 - 7.7 K/uL   Lymphocytes Relative 19  12 - 46 %   Lymphs Abs 1.6  0.7 - 4.0 K/uL   Monocytes Relative 10  3 - 12 %   Monocytes Absolute 0.8  0.1 - 1.0 K/uL   Eosinophils Relative 0  0 - 5 %   Eosinophils Absolute 0.0  0.0 - 0.7 K/uL   Basophils  Relative 0  0 - 1 %   Basophils Absolute 0.0  0.0 - 0.1 K/uL  COMPREHENSIVE METABOLIC PANEL      Result Value Range   Sodium 135  135 - 145 mEq/L   Potassium  3.9  3.5 - 5.1 mEq/L   Chloride 100  96 - 112 mEq/L   CO2 22  19 - 32 mEq/L   Glucose, Bld 480 (*) 70 - 99 mg/dL   BUN 10  6 - 23 mg/dL   Creatinine, Ser 2.13  0.50 - 1.10 mg/dL   Calcium 9.1  8.4 - 08.6 mg/dL   Total Protein 6.7  6.0 - 8.3 g/dL   Albumin 3.2 (*) 3.5 - 5.2 g/dL   AST 22  0 - 37 U/L   ALT 19  0 - 35 U/L   Alkaline Phosphatase 71  39 - 117 U/L   Total Bilirubin 0.5  0.3 - 1.2 mg/dL   GFR calc non Af Amer 83 (*) >90 mL/min   GFR calc Af Amer >90  >90 mL/min  URINALYSIS, ROUTINE W REFLEX MICROSCOPIC      Result Value Range   Color, Urine YELLOW  YELLOW   APPearance CLEAR  CLEAR   Specific Gravity, Urine 1.023  1.005 - 1.030   pH 6.0  5.0 - 8.0   Glucose, UA >1000 (*) NEGATIVE mg/dL   Hgb urine dipstick TRACE (*) NEGATIVE   Bilirubin Urine NEGATIVE  NEGATIVE   Ketones, ur NEGATIVE  NEGATIVE mg/dL   Protein, ur 578 (*) NEGATIVE mg/dL   Urobilinogen, UA 0.2  0.0 - 1.0 mg/dL   Nitrite NEGATIVE  NEGATIVE   Leukocytes, UA NEGATIVE  NEGATIVE  URINE MICROSCOPIC-ADD ON      Result Value Range   Squamous Epithelial / LPF RARE  RARE   WBC, UA 3-6  <3 WBC/hpf   RBC / HPF 0-2  <3 RBC/hpf   Bacteria, UA RARE  RARE  GLUCOSE, CAPILLARY      Result Value Range   Glucose-Capillary 363 (*) 70 - 99 mg/dL   Comment 1 Notify RN    POCT I-STAT TROPONIN I      Result Value Range   Troponin i, poc 0.05  0.00 - 0.08 ng/mL   Comment 3            Dg Chest 2 View  06/04/2013   *RADIOLOGY REPORT*  Clinical Data: Hyperglycemia.  CHEST - 2 VIEW  Comparison: Two-view chest x-ray 12/20/2012, 08/07/2010 [facility].  Findings: Cardiac silhouette moderately enlarged but stable, allowing for differences in technique.  Thoracic aorta mildly atherosclerotic, unchanged.  Hilar and mediastinal contours otherwise unremarkable.  Lungs clear.  Bronchovascular markings normal.  Pulmonary vascularity normal.  No pneumothorax.  No pleural effusions.  Degenerative changes and DISH involving the  thoracic spine.  No significant interval change.  IMPRESSION: Cardiomegaly.  No acute cardiopulmonary disease.  Stable examination.   Original Report Authenticated By: Hulan Saas, M.D.   Dg Knee 2 Views Left  06/07/2013   CLINICAL DATA:  No recent injury. Pain.  EXAM: LEFT KNEE - 1-2 VIEW  COMPARISON:  None.  FINDINGS: Advanced degenerative changes. Intra medullary rod partially imaged across a healed distal femoral fracture. No acute fracture, subluxation or dislocation. Small joint effusion.  IMPRESSION: Advanced degenerative changes. No acute findings.   Electronically Signed   By: Charlett Nose   On: 06/07/2013 11:17   Ct Head Wo Contrast  06/07/2013   CLINICAL DATA:  Altered mental  status. Fall.  EXAM: CT HEAD WITHOUT CONTRAST  TECHNIQUE: Contiguous axial images were obtained from the base of the skull through the vertex without intravenous contrast.  COMPARISON:  05/15/2011  FINDINGS: There is atrophy and chronic small vessel disease changes. No acute intracranial abnormality. Specifically, no hemorrhage, hydrocephalus, mass lesion, acute infarction, or significant intracranial injury. No acute calvarial abnormality. Visualized paranasal sinuses and mastoids clear. Orbital soft tissues unremarkable.  IMPRESSION: No acute intracranial abnormality.  Atrophy, chronic microvascular disease.   Electronically Signed   By: Charlett Nose   On: 06/07/2013 11:40      MDM   1. Confusion      Patient with altered mental status previously, currently mentating appropriately. Check basic labs, and to AMS workup.   Discussed the patient with Dr. Ranae Palms, who agrees with the plan.  Will admit the patient to the hospital. Discussed with Dr. Brien Few from [facility].    Roxy Horseman, PA-C 06/07/13 1515

## 2013-06-07 NOTE — ED Notes (Signed)
Rob Browning, PA at bedside  

## 2013-06-07 NOTE — Progress Notes (Signed)
NURSING PROGRESS NOTE  Regina [name] [id number] Admission Data: 06/07/2013 8:20 PM Attending Provider: Maretta Bees, MD WUJ:[id number],[id number] DENNIS, MD Code Status: full   Regina [name] is a 72 y.o. female patient admitted from ED:  -No acute distress noted.  -No complaints of shortness of breath.  -No complaints of chest pain.   Cardiac Monitoring: N/A Box Cardiac monitor yields NSR  Blood pressure 157/70, pulse 97, temperature 98.3 F (36.8 C), temperature source Oral, resp. rate 18, weight 103.42 kg (228 lb), SpO2 98.00%.   IV Fluids:  IV in place, occlusive dsg intact without redness, IV cath hand right, condition patent and no redness none.   Allergies:  Amoxicillin; Ciprofloxacin; Codeine; Duloxetine; Hydrochlorothiazide w-triamterene; and Ticlopidine hcl  Past Medical History:   has a past medical history of Hypertension; Hypercholesteremia; TIA (transient ischemic attack) (1990's); Legally blind; Heart murmur; Hypothyroidism; Type II diabetes mellitus; Anemia; GERD (gastroesophageal reflux disease); Arthritis; Depression; Vulva cancer; and Altered mental status.  Past Surgical History:   has past surgical history that includes Femur fracture surgery (Left, 1999); Cesarean section (1968); Tubal ligation (1970's); and Vulva surgery (1990's).  Social History:   reports that she quit smoking about 20 years ago. Her smoking use included Cigarettes. She has a 120 pack-year smoking history. She has never used smokeless tobacco. She reports that she does not drink alcohol or use illicit drugs.  Skin: scattered bruising on bilateral arms  Patient/Family orientated to room. Information packet given to patient/family. Admission inpatient armband information verified with patient/family to include name and date of birth and placed on patient arm. Side rails up x 2, fall assessment and education completed with patient/family. Patient/family able to verbalize understanding of risk associated  with falls and verbalized understanding to call for assistance before getting out of bed. Call light within reach. Patient/family able to voice and demonstrate understanding of unit orientation instructions.    Called admitting MD when the patient arrived to the unit. Will continue to evaluate and treat per MD orders.

## 2013-06-07 NOTE — ED Notes (Signed)
Duplicate order for UA placed. D/c duplicate

## 2013-06-07 NOTE — ED Notes (Signed)
Report given to floor, Pervis Hocking, RN. Nurse notified pt would have blood sugar check prior to leaving ER.

## 2013-06-07 NOTE — H&P (Signed)
PCP:   Michiel Sites, MD   Chief Complaint:  Altered mental status.   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 Patient is s/p hospitalization 06/04/13-06/06/13 for DKA. According to patient's daughter Shawna Orleans, who was at the bedside (tel: [phone], patient seemed quite confuse when she picked her up from the hospital at 4:30 PM on 06/06/13. On getting home, she found CBG to 455. She did administer patient's Levemir, as prescribed. Patient went to bed at 10:30 PM on 06/06/13, but appeared awake at 12:30 AM. She was definitely still in bed by 1:00 AM on 06/07/13, but when daughter checked on her at about 5:00 AM, patient was no longer in bed, or elsewhere in the house. Daughter eventually found patient lyuing in the bushes outside their home, without any shoes on. Her cane, still in the house. She appeared to have no obvious injuries apart from some scratches, and did not know how she got there. Patient and spouse brought patient to the ED. Head CT scan was negative for acute findings. CBG was 480.    Allergies:   Allergies  Allergen Reactions  . Amoxicillin     REACTION: rash  . Ciprofloxacin     REACTION: hives  . Codeine     REACTION: nervous, shaky  . Duloxetine     REACTION: reaction not known  . Hydrochlorothiazide W-Triamterene     REACTION: hyponatremia  . Ticlopidine Hcl     REACTION: bleeding      Past Medical History  Diagnosis Date  . Diabetes mellitus without complication   . Hypertension   . Hypercholesteremia   . Blind left eye   . TIA (transient ischemic attack)     History reviewed. No pertinent past surgical history.  Prior to Admission medications   Medication Sig Start Date End Date Taking? Authorizing Provider  amLODipine-valsartan (EXFORGE) 10-320 MG per tablet Take 1 tablet by mouth  daily. 06/06/13  Yes Nishant Dhungel, MD  aspirin EC 81 MG tablet Take 81 mg by mouth daily.   Yes Historical Provider, MD  atenolol (TENORMIN) 100 MG tablet Take 100 mg by mouth daily.   Yes Historical Provider, MD  Calcium Carbonate-Vit D-Min (CALCIUM 1200 PO) Take 1 tablet by mouth daily.   Yes Historical Provider, MD  carvedilol (COREG) 12.5 MG tablet Take 1 tablet (12.5 mg total) by mouth 2 (two) times daily with a meal. 06/06/13  Yes Nishant Dhungel, MD  ezetimibe (ZETIA) 10 MG tablet Take 10 mg by mouth daily.   Yes Historical Provider, MD  ferrous fumarate (HEMOCYTE - 106 MG FE) 325 (106 FE) MG TABS tablet Take 1 tablet by mouth daily.   Yes Historical Provider, MD  insulin aspart (NOVOLOG) 100 UNIT/ML injection Inject 10 Units into the skin 3 (three) times daily before meals. 20 units in the am, 10 units at lunch and 10 units at night 06/06/13  Yes Nishant Dhungel, MD  insulin detemir (LEVEMIR) 100 UNIT/ML injection Inject 0.45 mLs (45 Units total) into the skin 2 (two) times daily. 06/06/13  Yes Nishant Dhungel, MD  levothyroxine (SYNTHROID, LEVOTHROID) 75 MCG tablet Take 1 tablet (75 mcg total) by mouth daily before breakfast. 06/07/13  Yes Nishant Dhungel, MD  liothyronine (CYTOMEL) 25 MCG tablet Take 25 mcg by mouth 2 (two) times daily.   Yes Historical Provider, MD  metFORMIN (GLUCOPHAGE) 500  MG tablet Take 1,000 mg by mouth 2 (two) times daily.   Yes Historical Provider, MD  Omega-3 Fatty Acids (FISH OIL) 1000 MG CAPS Take 2,000 mg by mouth daily.   Yes Historical Provider, MD  ranitidine (ZANTAC) 75 MG tablet Take 75 mg by mouth 2 (two) times daily.   Yes Historical Provider, MD  simvastatin (ZOCOR) 20 MG tablet Take 1 tablet (20 mg total) by mouth every evening. 06/06/13  Yes Nishant Dhungel, MD  Travoprost, BAK Free, (TRAVATAN) 0.004 % SOLN ophthalmic solution Place 1 drop into both eyes at bedtime.   Yes Historical Provider, MD    Social History: Patient reports that she quit smoking  about 20 years ago. She has never used smokeless tobacco. She reports that she does not drink alcohol or use illicit drugs.  History reviewed. No pertinent family history.  Review of Systems:  As per HPI and chief complaint. Patent denies fatigue, diminished appetite, weight loss, fever, chills, headache, blurred vision, difficulty in speaking, dysphagia, chest pain, cough, shortness of breath, orthopnea, paroxysmal nocturnal dyspnea, nausea, diaphoresis, abdominal pain, vomiting, diarrhea, belching, heartburn, hematemesis, melena, dysuria, nocturia, urinary frequency, hematochezia, lower extremity swelling, pain, or redness. The rest of the systems review is negative.  Physical Exam:  General:  Patient does not appear to be in obvious acute distress. Alert, communicative, fully oriented, talking in complete sentences, not short of breath at rest. Hydration is satisfactory.  HEENT:  No clinical pallor, no jaundice, no conjunctival injection or discharge. NECK:  Supple, JVP not seen, no carotid bruits, no palpable lymphadenopathy, no palpable goiter. CHEST:  Clinically clear to auscultation, no wheezes, no crackles. HEART:  Sounds 1 and 2 heard, normal, regular, no murmurs. ABDOMEN:  Moderately obese, non-tender, no palpable organomegaly, no palpable masses, normal bowel sounds. GENITALIA:  Not examined. LOWER EXTREMITIES:  No pitting edema, palpable peripheral pulses. MUSCULOSKELETAL SYSTEM:  Generalized osteoarthritic changes, otherwise, normal. CENTRAL NERVOUS SYSTEM:  No focal neurologic deficit on gross examination.   Labs on Admission:  Results for orders placed during the hospital encounter of 06/07/13 (from the past 48 hour(s))  CBC WITH DIFFERENTIAL     Status: None   Collection Time    06/07/13  9:43 AM      Result Value Range   WBC 8.1  4.0 - 10.5 K/uL   RBC 4.74  3.87 - 5.11 MIL/uL   Hemoglobin 14.6  12.0 - 15.0 g/dL   HCT 16.1  09.6 - 04.5 %   MCV 87.6  78.0 - 100.0 fL    MCH 30.8  26.0 - 34.0 pg   MCHC 35.2  30.0 - 36.0 g/dL   RDW 40.9  81.1 - 91.4 %   Platelets 202  150 - 400 K/uL   Neutrophils Relative % 71  43 - 77 %   Neutro Abs 5.8  1.7 - 7.7 K/uL   Lymphocytes Relative 19  12 - 46 %   Lymphs Abs 1.6  0.7 - 4.0 K/uL   Monocytes Relative 10  3 - 12 %   Monocytes Absolute 0.8  0.1 - 1.0 K/uL   Eosinophils Relative 0  0 - 5 %   Eosinophils Absolute 0.0  0.0 - 0.7 K/uL   Basophils Relative 0  0 - 1 %   Basophils Absolute 0.0  0.0 - 0.1 K/uL  COMPREHENSIVE METABOLIC PANEL     Status: Abnormal   Collection Time    06/07/13  9:43 AM      Result  Value Range   Sodium 135  135 - 145 mEq/L   Potassium 3.9  3.5 - 5.1 mEq/L   Chloride 100  96 - 112 mEq/L   CO2 22  19 - 32 mEq/L   Glucose, Bld 480 (*) 70 - 99 mg/dL   BUN 10  6 - 23 mg/dL   Creatinine, Ser 1.61  0.50 - 1.10 mg/dL   Calcium 9.1  8.4 - 09.6 mg/dL   Total Protein 6.7  6.0 - 8.3 g/dL   Albumin 3.2 (*) 3.5 - 5.2 g/dL   AST 22  0 - 37 U/L   ALT 19  0 - 35 U/L   Alkaline Phosphatase 71  39 - 117 U/L   Total Bilirubin 0.5  0.3 - 1.2 mg/dL   GFR calc non Af Amer 83 (*) >90 mL/min   GFR calc Af Amer >90  >90 mL/min   Comment: (NOTE)     The eGFR has been calculated using the CKD EPI equation.     This calculation has not been validated in all clinical situations.     eGFR's persistently <90 mL/min signify possible Chronic Kidney     Disease.  POCT I-STAT TROPONIN I     Status: None   Collection Time    06/07/13 10:15 AM      Result Value Range   Troponin i, poc 0.05  0.00 - 0.08 ng/mL   Comment 3            Comment: Due to the release kinetics of cTnI,     a negative result within the first hours     of the onset of symptoms does not rule out     myocardial infarction with certainty.     If myocardial infarction is still suspected,     repeat the test at appropriate intervals.  URINALYSIS, ROUTINE W REFLEX MICROSCOPIC     Status: Abnormal   Collection Time    06/07/13 11:12 AM       Result Value Range   Color, Urine YELLOW  YELLOW   APPearance CLEAR  CLEAR   Specific Gravity, Urine 1.023  1.005 - 1.030   pH 6.0  5.0 - 8.0   Glucose, UA >1000 (*) NEGATIVE mg/dL   Hgb urine dipstick TRACE (*) NEGATIVE   Bilirubin Urine NEGATIVE  NEGATIVE   Ketones, ur NEGATIVE  NEGATIVE mg/dL   Protein, ur 045 (*) NEGATIVE mg/dL   Urobilinogen, UA 0.2  0.0 - 1.0 mg/dL   Nitrite NEGATIVE  NEGATIVE   Leukocytes, UA NEGATIVE  NEGATIVE  URINE MICROSCOPIC-ADD ON     Status: None   Collection Time    06/07/13 11:12 AM      Result Value Range   Squamous Epithelial / LPF RARE  RARE   WBC, UA 3-6  <3 WBC/hpf   RBC / HPF 0-2  <3 RBC/hpf   Bacteria, UA RARE  RARE  GLUCOSE, CAPILLARY     Status: Abnormal   Collection Time    06/07/13  2:32 PM      Result Value Range   Glucose-Capillary 363 (*) 70 - 99 mg/dL   Comment 1 Notify RN      Radiological Exams on Admission: Dg Knee 2 Views Left  06/07/2013   CLINICAL DATA:  No recent injury. Pain.  EXAM: LEFT KNEE - 1-2 VIEW  COMPARISON:  None.  FINDINGS: Advanced degenerative changes. Intra medullary rod partially imaged across a healed distal femoral fracture. No acute  fracture, subluxation or dislocation. Small joint effusion.  IMPRESSION: Advanced degenerative changes. No acute findings.   Electronically Signed   By: Charlett Nose   On: 06/07/2013 11:17   Ct Head Wo Contrast  06/07/2013   CLINICAL DATA:  Altered mental status. Fall.  EXAM: CT HEAD WITHOUT CONTRAST  TECHNIQUE: Contiguous axial images were obtained from the base of the skull through the vertex without intravenous contrast.  COMPARISON:  05/15/2011  FINDINGS: There is atrophy and chronic small vessel disease changes. No acute intracranial abnormality. Specifically, no hemorrhage, hydrocephalus, mass lesion, acute infarction, or significant intracranial injury. No acute calvarial abnormality. Visualized paranasal sinuses and mastoids clear. Orbital soft tissues unremarkable.   IMPRESSION: No acute intracranial abnormality.  Atrophy, chronic microvascular disease.   Electronically Signed   By: Charlett Nose   On: 06/07/2013 11:40    Assessment/Plan Active Problems:    1. Altered mental status: Patient was reportedly confused, when she left hospital on 06/06/13, and somehow managed to make it out of her home and into the bushes, in the wee hours of 06/08/13. Patient has no recollection of how she got there, and in the ED, appeared fully oriented and lucid. U/A is unremarkable, patient has no obvious focus of infection, and head CT scan was devoid of acute findings. Patient is apyrexial and wcc is normal. AMS is likely metabolic, given uncontrolled DM, superimposed on possible early dementia. Will do brain MRI, check B12, Folate and RPR, and do CXR for completeness. Patient may need placement . Will consult PT/OT to evaluate. 2. Uncontrolled diabetes mellitus: Patient has insulin-requiring DM-2, which appears uncontrolled at this time, based on random blood glucose of 480, despite compliance with medication on 06/07/13. Fortunately, she has no evidence of DKA dehydration or hyperosmolality. Will manage with diet, Levemir and SSI, adjusted as indicated.  3. HTN (hypertension): BP is sub-optimally controlled at this time. Will continue pre-admission antihypertensives, and monitor.  4. Hypothyroidism: Continue pre-admission thyroxine replacement therapy.  5. Anxiety and depression: Stable. 6. Dyslipidemia:  On Statin.   Further management will depend on clinical course.   Comment: patient if Full Code.    Time Spent on Admission: 1 hour.   Bartolo Montanye,CHRISTOPHER 06/07/2013, 4:10 PM

## 2013-06-07 NOTE — ED Notes (Signed)
Pt daughter at bedside states pt was released from hospital yesterday. Pt was admitted for DKA. Pts daughter states she found pt this morning in the bushes outside of home. Pt had her head on bush. Pt states she does not know how she got out there and does not remember what happened. Pt currently alert and oriented at this time.

## 2013-06-07 NOTE — ED Notes (Signed)
Pt being transported to floor via Jeannett Senior, EMT

## 2013-06-08 ENCOUNTER — Observation Stay (HOSPITAL_COMMUNITY): Payer: Medicare Other

## 2013-06-08 DIAGNOSIS — E785 Hyperlipidemia, unspecified: Secondary | ICD-10-CM

## 2013-06-08 DIAGNOSIS — F29 Unspecified psychosis not due to a substance or known physiological condition: Secondary | ICD-10-CM

## 2013-06-08 LAB — CBC
Hemoglobin: 13.8 g/dL (ref 12.0–15.0)
MCH: 31.4 pg (ref 26.0–34.0)
MCHC: 35.4 g/dL (ref 30.0–36.0)
MCV: 88.6 fL (ref 78.0–100.0)
Platelets: 184 10*3/uL (ref 150–400)
RBC: 4.4 MIL/uL (ref 3.87–5.11)

## 2013-06-08 LAB — COMPREHENSIVE METABOLIC PANEL
ALT: 17 U/L (ref 0–35)
AST: 17 U/L (ref 0–37)
Albumin: 3 g/dL — ABNORMAL LOW (ref 3.5–5.2)
CO2: 29 mEq/L (ref 19–32)
Chloride: 103 mEq/L (ref 96–112)
Creatinine, Ser: 0.69 mg/dL (ref 0.50–1.10)
GFR calc non Af Amer: 86 mL/min — ABNORMAL LOW (ref >90)
Sodium: 140 mEq/L (ref 135–145)
Total Bilirubin: 0.4 mg/dL (ref 0.3–1.2)

## 2013-06-08 LAB — RPR: RPR Ser Ql: NONREACTIVE

## 2013-06-08 LAB — GLUCOSE, CAPILLARY
Glucose-Capillary: 231 mg/dL — ABNORMAL HIGH (ref 70–99)
Glucose-Capillary: 156 mg/dL — ABNORMAL HIGH (ref 70–99)

## 2013-06-08 LAB — HEMOGLOBIN A1C: Hgb A1c MFr Bld: 9.6 % — ABNORMAL HIGH (ref <5.7)

## 2013-06-08 MED ORDER — CARVEDILOL 25 MG PO TABS
25.0000 mg | ORAL_TABLET | Freq: Two times a day (BID) | ORAL | Status: DC
  Administered 2013-06-08 – 2013-06-09 (×3): 25 mg via ORAL
  Filled 2013-06-08 (×5): qty 1

## 2013-06-08 MED ORDER — HYDRALAZINE HCL 20 MG/ML IJ SOLN
5.0000 mg | INTRAMUSCULAR | Status: DC | PRN
  Administered 2013-06-08: 5 mg via INTRAVENOUS
  Filled 2013-06-08: qty 1

## 2013-06-08 NOTE — ED Provider Notes (Signed)
Medical screening examination/treatment/procedure(s) were conducted as a shared visit with non-physician practitioner(s) and myself.  I personally evaluated the patient during the encounter Patient recently discharged from the hospital for DKA, now presents with altered mental status. Found by daughter wandering outside and playing in the bushes. She has no focal neurologic deficit on my exam. Daughter is concerned that she still is not completely oriented. Will admit for observation.  Loren Racer, MD 06/08/13 [phone]

## 2013-06-08 NOTE — Progress Notes (Signed)
EEG completed; results pending.    

## 2013-06-08 NOTE — Evaluation (Signed)
Occupational Therapy Evaluation Patient Details Name: Regina [name] MRN: [id number] DOB: 1941/04/17 Today's Date: 06/08/2013 Time: 4098-1191 OT Time Calculation (min): 22 min  OT Assessment / Plan / Recommendation History of present illness 72 year old obese female with history of diabetes mellitus type 2 on insulin (last A1c 8 about 2 months back as per patient), hypertension, hyperlipidemia, history of TIA who was brought in by her daughter for elevated blood glucose noted the morning of her admit. Patient had been feeling quite weak for almost 7-10 days. She had some [place] food then the next day she had 2 episodes of vomiting. Her blood glucose had been ranging from 250 to 300s for a few days. She checked her blood glucose the morning of her admit and it was reading >600.     Clinical Impression   Pt admitted with above. Pt currently with functional limitations due to the deficits listed below (see OT Problem List). Pt will benefit from skilled OT to increase their safety and independence with ADL and functional mobility for ADL to facilitate discharge to venue listed below.       OT Assessment  Patient needs continued OT Services    Follow Up Recommendations  SNF    Barriers to Discharge Decreased caregiver support    Equipment Recommendations  None recommended by OT       Frequency  Min 2X/week    Precautions / Restrictions Precautions Precautions: Fall Restrictions Weight Bearing Restrictions: No       ADL  Eating/Feeding: Independent Where Assessed - Eating/Feeding: Chair Grooming: Min guard;Wash/dry hands Where Assessed - Grooming: Unsupported standing Upper Body Bathing: Set up Where Assessed - Upper Body Bathing: Unsupported sitting Lower Body Bathing: Min guard Where Assessed - Lower Body Bathing: Unsupported sit to stand Upper Body Dressing: Set up Where Assessed - Upper Body Dressing: Unsupported sitting Lower Body Dressing: Min guard Where Assessed -  Lower Body Dressing: Unsupported sit to stand Toilet Transfer: Min Pension scheme manager Method: Sit to Barista: Regular height toilet;Grab bars Toileting - Architect and Hygiene: Min guard Where Assessed - Engineer, mining and Hygiene: Sit to stand from 3-in-1 or toilet Equipment Used: Rolling walker;Gait belt Transfers/Ambulation Related to ADLs: min guard A for all    OT Diagnosis: Generalized weakness  OT Problem List: Decreased strength;Obesity;Impaired balance (sitting and/or standing);Decreased knowledge of use of DME or AE OT Treatment Interventions: Self-care/ADL training;Balance training;DME and/or AE instruction;Patient/family education   OT Goals(Current goals can be found in the care plan section) Acute Rehab OT Goals OT Goal Formulation: With patient Time For Goal Achievement: 06/15/13 Potential to Achieve Goals: Good  Visit Information  Last OT Received On: 06/08/13 Assistance Needed: +1 PT/OT Co-Evaluation/Treatment: Yes (for eval) History of Present Illness: 72 year old obese female with history of diabetes mellitus type 2 on insulin (last A1c 8 about 2 months back as per patient), hypertension, hyperlipidemia, history of TIA who was brought in by her daughter for elevated blood glucose noted the morning of her admit. Patient had been feeling quite weak for almost 7-10 days. She had some [place] food then the next day she had 2 episodes of vomiting. Her blood glucose had been ranging from 250 to 300s for a few days. She checked her blood glucose the morning of her admit and it was reading >600.         Prior Functioning     Home Living Family/patient expects to be discharged to:: Skilled nursing facility Living Arrangements: Alone  Available Help at Discharge: Family;Available PRN/intermittently Type of Home: House Home Access: Stairs to enter [employer] of Steps: 1 Entrance Stairs-Rails: None Home  Layout: One level Home Equipment: Cane - single point;Walker - standard Prior Function Level of Independence: Independent with assistive device(s) Communication Communication: No difficulties Dominant Hand: Right         Vision/Perception Vision - History Patient Visual Report: No change from baseline   Cognition  Cognition Arousal/Alertness: Awake/alert Behavior During Therapy: WFL for tasks assessed/performed Overall Cognitive Status: Within Functional Limits for tasks assessed    Extremity/Trunk Assessment Upper Extremity Assessment Upper Extremity Assessment: Overall WFL for tasks assessed     Mobility Bed Mobility Bed Mobility: Supine to Sit;Sitting - Scoot to Edge of Bed Supine to Sit: 4: Min assist;HOB flat Sitting - Scoot to [place] of Bed: 4: Min guard Transfers Transfers: Sit to Stand;Stand to Sit Sit to Stand: 4: Min guard;With upper extremity assist;From bed Stand to Sit: 4: Min guard;With upper extremity assist;With armrests;To chair/3-in-1           End of Session OT - End of Session Equipment Utilized During Treatment: Gait belt;Rolling walker Activity Tolerance: Patient tolerated treatment well Patient left: in chair;with call bell/phone within reach  GO Functional Assessment Tool Used: Clinical observation Functional Limitation: Self care Self Care Current Status (R6045): At least 1 percent but less than 20 percent impaired, limited or restricted Self Care Goal Status (W0981): At least 1 percent but less than 20 percent impaired, limited or restricted   [name] [phone] 06/08/2013, 9:44 AM

## 2013-06-08 NOTE — Progress Notes (Signed)
Inpatient Diabetes Program Recommendations  AACE/ADA: New Consensus Statement on Inpatient Glycemic Control (2013)  Target Ranges:  Prepandial:   less than 140 mg/dL      Peak postprandial:   less than 180 mg/dL (1-2 hours)      Critically ill patients:  140 - 180 mg/dL   Reason for Visit:  Note patient was discharged on 06/06/13.  According to history, patient was found the next morning confused with CBG's greater than 400 mg/dL.    Spoke to patient this morning and she states that she is not sure what happened and she does note remember whether she took her insulin.  She stated "I just finished my supper".   Patient appears to still be confused.  Discussed with Dr. Jerral Ralph.  Will follow.

## 2013-06-08 NOTE — Progress Notes (Signed)
PATIENT DETAILS Name: Regina [name] Age: 72 y.o. Sex: female Date of Birth: Nov 28, 1940 Admit Date: 06/07/2013 Admitting Physician Laveda Norman, MD ZOX:[id number] DENNIS, MD  Subjective: Admitted with altered mental status-confusion-was recently discharged and apparently in car out of the house and was found in the bushes by family. Currently is awake and alert, does not exactly have a good recollection of the events of the hospitalization  Assessment/Plan: Active Problems:   Altered mental status - Not sure the exact etiology is - No fever, UA negative for UTI, chest x-ray negative for pneumonia-no other clinical foci of infection evident - Does not appear to be on any medications that can be blamed as well - Check MRI and EEG - Monitor clinically    Uncontrolled diabetes mellitus - Recent admission for DKA, was discharged on Levemir 45 units twice a day and 10 units of NovoLog 3 times a day - CBGs still uncontrolled, continue with current dozing off insulin-we'll see how CBGs trend over the next 24 hours before adjusting  Dyslipidemia - Continue on Zetia and simvastatin  Hypothyroidism - Continue levothyroxine  Hypertension - Uncontrolled - Continue with Avapro , amlodipine, appears patient on both Coreg and atenolol- will stop atenolol, and add a diuretic  Systolic Murmur  Likely related to moderate AS noted on echo May 2014 . Follows with [facility] as outpt   Hx of TIA  Continue ASA and zocor  Disposition: Remain inpatient  DVT Prophylaxis: Prophylactic Heparin  Code Status: Full code   Family Communication None at bedside  Procedures:  None  CONSULTS:  None   MEDICATIONS: Scheduled Meds: . amLODipine  10 mg Oral Daily  . aspirin EC  81 mg Oral Daily  . atenolol  100 mg Oral Daily  . carvedilol  12.5 mg Oral BID WC  . ezetimibe  10 mg Oral Daily  . famotidine  20 mg Oral BID  . ferrous fumarate  1 tablet Oral Daily  . heparin  5,000 Units  Subcutaneous Q8H  . insulin aspart  0-5 Units Subcutaneous QHS  . insulin aspart  0-9 Units Subcutaneous TID WC  . insulin aspart  6 Units Subcutaneous TID WC  . insulin detemir  45 Units Subcutaneous BID  . irbesartan  300 mg Oral Daily  . latanoprost  1 drop Both Eyes QHS  . levothyroxine  75 mcg Oral QAC breakfast  . liothyronine  25 mcg Oral BID  . omega-3 acid ethyl esters  1 g Oral Daily  . simvastatin  20 mg Oral QPM  . sodium chloride  3 mL Intravenous Q12H   Continuous Infusions:  PRN Meds:.sodium chloride, acetaminophen, acetaminophen, hydrALAZINE, ondansetron (ZOFRAN) IV, ondansetron, sodium chloride  Antibiotics: Anti-infectives   None       PHYSICAL EXAM: Vital signs in last 24 hours: Filed Vitals:   06/08/13 0149 06/08/13 0500 06/08/13 0637 06/08/13 0815  BP:  180/75 178/82 167/71  Pulse:  79    Temp:  98.1 F (36.7 C)    TempSrc:  Oral    Resp:  18    Height: 5' (1.524 m)     Weight: 103.919 kg (229 lb 1.6 oz)     SpO2:  95%      Weight change:  Filed Weights   06/07/13 1712 06/08/13 0149  Weight: 103.42 kg (228 lb) 103.919 kg (229 lb 1.6 oz)   Body mass index is 44.74 kg/(m^2).   Gen Exam: Awake and alert with clear speech.   Neck: Supple,  No JVD.   Chest: B/L Clear.   CVS: S1 S2 Regular, no murmurs.  Abdomen: soft, BS +, non tender, non distended.  Extremities: no edema, lower extremities warm to touch. Neurologic: Non Focal.   Skin: No Rash.   Wounds: N/A.    Intake/Output from previous day:  Intake/Output Summary (Last 24 hours) at 06/08/13 0848 Last data filed at 06/07/13 1911  Gross per 24 hour  Intake   1240 ml  Output      0 ml  Net   1240 ml     LAB RESULTS: CBC  Recent Labs Lab 06/04/13 1100 06/07/13 0943 06/07/13 1814  WBC 11.2* 8.1 7.5  HGB 13.3 14.6 14.3  HCT 39.3 41.5 40.4  PLT 232 202 177  MCV 91.8 87.6 88.0  MCH 31.1 30.8 31.2  MCHC 33.8 35.2 35.4  RDW 13.5 13.5 13.6  LYMPHSABS 1.4 1.6  --   MONOABS 1.7*  0.8  --   EOSABS 0.0 0.0  --   BASOSABS 0.0 0.0  --     Chemistries   Recent Labs Lab 06/04/13 1720 06/04/13 1812 06/04/13 2100 06/06/13 0517 06/07/13 0943 06/07/13 1814  NA 134* 136 138 141 135  --   K 3.2* 3.3* 3.1* 4.1 3.9  --   CL 99 102 105 106 100  --   CO2 21 22 21 23 22   --   GLUCOSE 450* 347* 177* 56* 480*  --   BUN 22 21 20 9 10   --   CREATININE 0.89 0.83 0.83 0.67 0.76 0.62  CALCIUM 8.6 8.5 8.5 9.3 9.1  --     CBG:  Recent Labs Lab 06/06/13 1208 06/07/13 1432 06/07/13 1656 06/07/13 2055 06/08/13 0803  GLUCAP 152* 363* 342* 323* 250*    GFR Estimated Creatinine Clearance: 71.2 ml/min (by C-G formula based on Cr of 0.62).  Coagulation profile No results found for this basename: INR, PROTIME,  in the last 168 hours  Cardiac Enzymes  Recent Labs Lab 06/04/13 1100  TROPONINI <0.30    No components found with this basename: POCBNP,  No results found for this basename: DDIMER,  in the last 72 hours  Recent Labs  06/07/13 1814  HGBA1C 9.6*   No results found for this basename: CHOL, HDL, LDLCALC, TRIG, CHOLHDL, LDLDIRECT,  in the last 72 hours  Recent Labs  06/07/13 1814  TSH 0.071*   No results found for this basename: VITAMINB12, FOLATE, FERRITIN, TIBC, IRON, RETICCTPCT,  in the last 72 hours No results found for this basename: LIPASE, AMYLASE,  in the last 72 hours  Urine Studies No results found for this basename: UACOL, UAPR, USPG, UPH, UTP, UGL, UKET, UBIL, UHGB, UNIT, UROB, ULEU, UEPI, UWBC, URBC, UBAC, CAST, CRYS, UCOM, BILUA,  in the last 72 hours  MICROBIOLOGY: Recent Results (from the past 240 hour(s))  URINE CULTURE     Status: None   Collection Time    06/04/13  1:36 PM      Result Value Range Status   Specimen Description URINE, RANDOM   Final   Special Requests NONE   Final   Culture  Setup Time     Final   Value: 06/04/2013 23:17     Performed at [facility] Count     Final   Value: 30,000  COLONIES/ML     Performed at [facility]   Culture     Final   Value: Multiple bacterial morphotypes present, none  predominant. Suggest appropriate recollection if clinically indicated.     Performed at [facility]   Report Status 06/05/2013 FINAL   Final  MRSA PCR SCREENING     Status: None   Collection Time    06/04/13  2:38 PM      Result Value Range Status   MRSA by PCR NEGATIVE  NEGATIVE Final   Comment:            The GeneXpert MRSA Assay (FDA     approved for NASAL specimens     only), is one component of a     comprehensive MRSA colonization     surveillance program. It is not     intended to diagnose MRSA     infection nor to guide or     monitor treatment for     MRSA infections.      RADIOLOGY STUDIES/RESULTS: Dg Chest 2 View  06/04/2013   *RADIOLOGY REPORT*  Clinical Data: Hyperglycemia.  CHEST - 2 VIEW  Comparison: Two-view chest x-ray 12/20/2012, 08/07/2010 [facility].  Findings: Cardiac silhouette moderately enlarged but stable, allowing for differences in technique.  Thoracic aorta mildly atherosclerotic, unchanged.  Hilar and mediastinal contours otherwise unremarkable.  Lungs clear.  Bronchovascular markings normal.  Pulmonary vascularity normal.  No pneumothorax.  No pleural effusions.  Degenerative changes and DISH involving the thoracic spine.  No significant interval change.  IMPRESSION: Cardiomegaly.  No acute cardiopulmonary disease.  Stable examination.   Original Report Authenticated By: Hulan Saas, M.D.   Dg Knee 2 Views Left  06/07/2013   CLINICAL DATA:  No recent injury. Pain.  EXAM: LEFT KNEE - 1-2 VIEW  COMPARISON:  None.  FINDINGS: Advanced degenerative changes. Intra medullary rod partially imaged across a healed distal femoral fracture. No acute fracture, subluxation or dislocation. Small joint effusion.  IMPRESSION: Advanced degenerative changes. No acute findings.   Electronically Signed   By: Charlett Nose   On:  06/07/2013 11:17   Ct Head Wo Contrast  06/07/2013   CLINICAL DATA:  Altered mental status. Fall.  EXAM: CT HEAD WITHOUT CONTRAST  TECHNIQUE: Contiguous axial images were obtained from the base of the skull through the vertex without intravenous contrast.  COMPARISON:  05/15/2011  FINDINGS: There is atrophy and chronic small vessel disease changes. No acute intracranial abnormality. Specifically, no hemorrhage, hydrocephalus, mass lesion, acute infarction, or significant intracranial injury. No acute calvarial abnormality. Visualized paranasal sinuses and mastoids clear. Orbital soft tissues unremarkable.  IMPRESSION: No acute intracranial abnormality.  Atrophy, chronic microvascular disease.   Electronically Signed   By: Charlett Nose   On: 06/07/2013 11:40   Portable Chest 1 View  06/07/2013   *RADIOLOGY REPORT*  Clinical Data: Altered mental status.  PORTABLE CHEST - 1 VIEW  Comparison: 06/04/2013.  Findings: Trachea is midline.  Heart size stable.  Lungs are clear. No pleural fluid.  IMPRESSION: No acute findings.   Original Report Authenticated By: Leanna Battles, M.D.    Jeoffrey Massed, MD  [facility] Pager:[phone]  If 7PM-7AM, please contact night-coverage [email or web address] Password [facility] 06/08/2013, 8:48 AM   LOS: 1 day

## 2013-06-08 NOTE — Progress Notes (Signed)
Called regarding:  MRI positive for small acute lacunar infarct and ?additional punctate acute lacunar infarct in the  central pons.  Patient on 81 mg asa.  Neuro on board.  Nothing further to add this evening.  Algis Downs, PA-C [facility] Pager: [phone]

## 2013-06-08 NOTE — Clinical Social Work Note (Signed)
CSW met with patient in morning who seemed somewhat confused and disoriented. CSW contacted patient's daughter Shawna Orleans (contact info in assessment). Shawna Orleans has requested [facility] for patient. CSW was able to secure bed with [facility]. Daughter will complete paperwork with facility.   [name], [place], [place], [phone]

## 2013-06-08 NOTE — Care Management Note (Signed)
    Page 1 of 1   06/09/2013     12:36:07 PM   CARE MANAGEMENT NOTE 06/09/2013  Patient:  Regina [name], Regina [name]   Account Number:  [id number]  Date Initiated:  06/08/2013  Documentation initiated by:  Letha Cape  Subjective/Objective Assessment:   dx ams,hyperglycemia  admit as observation- lives alone.     Action/Plan:   pt/ot eval- recs snf.   Anticipated DC Date:  06/09/2013   Anticipated DC Plan:  SKILLED NURSING FACILITY  In-house referral  Clinical Social Worker      DC Planning Services  CM consult      Choice offered to / List presented to:             Status of service:  Completed, signed off Medicare Important Message given?   (If response is "NO", the following Medicare IM given date fields will be blank) Date Medicare IM given:   Date Additional Medicare IM given:    Discharge Disposition:  SKILLED NURSING FACILITY  Per UR Regulation:  Reviewed for med. necessity/level of care/duration of stay  If discussed at Long Length of Stay Meetings, dates discussed:    Comments:  06/09/13 12:34 Letha Cape RN, BSN [phone] patient is for dc to [facility] SNF today. CSW following.  06/08/13 15:05 Letha Cape RN,BSN [phone] patient lives alone, per pt/ot eval recs snf, CSW referral. Received referral for medication ast, informed patient that she is being dc to a snf and they will have all the medications she will need there.  Also informed patient that she has insurance and patient states that she is in a doughnut hole, informed patient that since she is going to snf I will not be able to ast her at this time.

## 2013-06-08 NOTE — Evaluation (Signed)
Physical Therapy Evaluation Patient Details Name: Regina [name] MRN: [id number] DOB: 1941/02/22 Today's Date: 06/08/2013 Time: 0902-0920 PT Time Calculation (min): 18 min  PT Assessment / Plan / Recommendation History of Present Illness  72 year old obese female with history of diabetes mellitus type 2 on insulin (last A1c 8 about 2 months back as per patient), hypertension, hyperlipidemia, history of TIA who was brought in by her daughter for elevated blood glucose noted the morning of her admit. Patient had been feeling quite weak for almost 7-10 days. She had some [place] food then the next day she had 2 episodes of vomiting. Her blood glucose had been ranging from 250 to 300s for a few days. She checked her blood glucose the morning of her admit and it was reading >600.    Clinical Impression  Pt admitted with above. Pt currently with functional limitations due to the deficits listed below (see PT Problem List).  Pt will benefit from skilled PT to increase their independence and safety with mobility to allow discharge to the venue listed below.       PT Assessment  Patient needs continued PT services    Follow Up Recommendations  SNF    Does the patient have the potential to tolerate intense rehabilitation      Barriers to Discharge Decreased caregiver support      Equipment Recommendations  Rolling walker with 5" wheels    Recommendations for Other Services     Frequency Min 3X/week    Precautions / Restrictions Precautions Precautions: Fall Restrictions Weight Bearing Restrictions: No   Pertinent Vitals/Pain No c/o's.      Mobility  Bed Mobility Bed Mobility: Supine to Sit;Sitting - Scoot to Edge of Bed Supine to Sit: 4: Min assist;HOB flat Sitting - Scoot to [employer] of Bed: 4: Min guard Transfers Transfers: Sit to Stand;Stand to Sit Sit to Stand: 4: Min guard;With upper extremity assist;From bed Stand to Sit: 4: Min guard;With upper extremity assist;With armrests;To  chair/3-in-1 Details for Transfer Assistance: Assist for balance and to control descent. Ambulation/Gait Ambulation/Gait Assistance: 4: Min guard;4: Min Environmental consultant (Feet): 150 Feet Assistive device: Rolling walker;Straight cane Ambulation/Gait Assistance Details: Min A for balance when using cane.  Improved some with walker and required min guard A with walker.  Incr lateral trunk sway bil. Gait Pattern: Step-through pattern;Decreased stride length;Wide base of support Gait velocity: decreased    Exercises     PT Diagnosis: Difficulty walking;Generalized weakness  PT Problem List: Decreased strength;Decreased balance;Decreased mobility;Obesity PT Treatment Interventions: DME instruction;Gait training;Functional mobility training;Therapeutic activities;Therapeutic exercise;Patient/family education;Balance training     PT Goals(Current goals can be found in the care plan section) Acute Rehab PT Goals Patient Stated Goal: To return home eventually.  Pt willing to go to rehab to be more independent. PT Goal Formulation: With patient/family Time For Goal Achievement: 06/15/13 Potential to Achieve Goals: Good  Visit Information  Last PT Received On: 06/08/13 Assistance Needed: +1 PT/OT Co-Evaluation/Treatment: Yes History of Present Illness: 72 year old obese female with history of diabetes mellitus type 2 on insulin (last A1c 8 about 2 months back as per patient), hypertension, hyperlipidemia, history of TIA who was brought in by her daughter for elevated blood glucose noted the morning of her admit. Patient had been feeling quite weak for almost 7-10 days. She had some [place] food then the next day she had 2 episodes of vomiting. Her blood glucose had been ranging from 250 to 300s for a few days. She checked her  blood glucose the morning of her admit and it was reading >600.         Prior Functioning  Home Living Family/patient expects to be discharged to:: Skilled  nursing facility Living Arrangements: Alone Available Help at Discharge: Family;Available PRN/intermittently Type of Home: House Home Access: Stairs to enter [employer] of Steps: 1 Entrance Stairs-Rails: None Home Layout: One level Home Equipment: Cane - single point;Walker - standard Prior Function Level of Independence: Independent with assistive device(s) Communication Communication: No difficulties Dominant Hand: Right    Cognition  Cognition Arousal/Alertness: Awake/alert Behavior During Therapy: WFL for tasks assessed/performed Overall Cognitive Status: Within Functional Limits for tasks assessed    Extremity/Trunk Assessment Upper Extremity Assessment Upper Extremity Assessment: Overall WFL for tasks assessed Lower Extremity Assessment Lower Extremity Assessment: Generalized weakness   Balance Balance Balance Assessed: Yes Static Standing Balance Static Standing - Balance Support: During functional activity Static Standing - Level of Assistance: 4: Min assist  End of Session PT - End of Session Equipment Utilized During Treatment: Gait belt Activity Tolerance: Patient tolerated treatment well Patient left: in chair;with call bell/phone within reach Nurse Communication: Mobility status  GP Functional Assessment Tool Used: clinical judgement Functional Limitation: Mobility: Walking and moving around Mobility: Walking and Moving Around Current Status [phone]): At least 1 percent but less than 20 percent impaired, limited or restricted Mobility: Walking and Moving Around Goal Status [phone]): 0 percent impaired, limited or restricted   [facility] 06/08/2013, 10:19 AM  [facility] PT [phone]

## 2013-06-08 NOTE — Clinical Social Work Psychosocial (Signed)
Clinical Social Work Department BRIEF PSYCHOSOCIAL ASSESSMENT 06/08/2013  Patient:  Regina [name], Regina [name]     Account Number:  [id number]     Admit date:  06/07/2013  Clinical Social Worker:  Lavell Luster  Date/Time:  06/08/2013 10:30 AM  Referred by:  Physician  Date Referred:  06/08/2013 Referred for  SNF Placement   Other Referral:   Interview type:  Other - See comment Other interview type:   CSW attempted to interview patient but patient is disoriented. CSW spoke with daughter Shawna Orleans.    PSYCHOSOCIAL DATA Living Status:  ALONE Admitted from facility:   Level of care:   Primary support name:  [name] Primary support relationship to patient:  CHILD, ADULT Degree of support available:   Support appears to be strong.    CURRENT CONCERNS Current Concerns  Post-Acute Placement   Other Concerns:    SOCIAL WORK ASSESSMENT / PLAN CSW spoke with daughter Shawna Orleans [phone] over the phone. According to the daughter, patient has not been to SNF before but the daughter would like to patient to be placed at [facility] or [facility]. CSW explained SNF search process and daughter was agreeable.   Assessment/plan status:  Psychosocial Support/Ongoing Assessment of Needs Other assessment/ plan:   Complete FL2, PASRR, Fax out   Information/referral to community resources:   SNF list left with patient for review.    PATIENT'S/FAMILY'S RESPONSE TO PLAN OF CARE: Daughter is agreeable to SNF and will assist patient with paperwork at facility when patient discharges. Daughter was pleasant and appreciative of CSW phone call.     [name], [name], [name], [phone]

## 2013-06-08 NOTE — Procedures (Signed)
ELECTROENCEPHALOGRAM REPORT   Patient: Regina [name]       Room #: 1O10  Age: 72 y.o.        Sex: female Referring Physician: Ghimire Report Date:  06/08/2013        Interpreting Physician: Thana Farr D  History: [name] is an 72 y.o. female with confusion  Medications:  Scheduled: . amLODipine  10 mg Oral Daily  . aspirin EC  81 mg Oral Daily  . carvedilol  25 mg Oral BID WC  . ezetimibe  10 mg Oral Daily  . famotidine  20 mg Oral BID  . ferrous fumarate  1 tablet Oral Daily  . heparin  5,000 Units Subcutaneous Q8H  . insulin aspart  0-5 Units Subcutaneous QHS  . insulin aspart  0-9 Units Subcutaneous TID WC  . insulin aspart  6 Units Subcutaneous TID WC  . insulin detemir  45 Units Subcutaneous BID  . irbesartan  300 mg Oral Daily  . latanoprost  1 drop Both Eyes QHS  . levothyroxine  75 mcg Oral QAC breakfast  . liothyronine  25 mcg Oral BID  . omega-3 acid ethyl esters  1 g Oral Daily  . simvastatin  20 mg Oral QPM  . sodium chloride  3 mL Intravenous Q12H    Conditions of Recording:  This is a 16 channel EEG carried out with the patient in the awake state.  Description:  The waking background activity consists of a low voltage, symmetrical, fairly well organized, 9 Hz alpha activity, seen from the parieto-occipital and posterior temporal regions.  Low voltage fast activity, poorly organized, is seen anteriorly and is at times superimposed on more posterior regions.  A mixture of theta and alpha rhythms are seen from the central and temporal regions. The patient does not drowse or sleep. Hyperventilation and intermittent photic stimulation were not performed.   IMPRESSION: This is a normal awake EEG  Comment:  An EEG with the patient sleep deprived to elicit drowse and light sleep may be desirable to further elicit a possible seizure disorder.     Thana Farr, MD [facility] [phone] 06/08/2013, 7:04 PM

## 2013-06-09 ENCOUNTER — Telehealth: Payer: Self-pay | Admitting: Family Medicine

## 2013-06-09 ENCOUNTER — Observation Stay (HOSPITAL_COMMUNITY): Payer: Medicare Other

## 2013-06-09 ENCOUNTER — Encounter (HOSPITAL_COMMUNITY): Payer: Self-pay | Admitting: Neurology

## 2013-06-09 DIAGNOSIS — I6350 Cerebral infarction due to unspecified occlusion or stenosis of unspecified cerebral artery: Principal | ICD-10-CM

## 2013-06-09 DIAGNOSIS — E119 Type 2 diabetes mellitus without complications: Secondary | ICD-10-CM

## 2013-06-09 DIAGNOSIS — Z8673 Personal history of transient ischemic attack (TIA), and cerebral infarction without residual deficits: Secondary | ICD-10-CM

## 2013-06-09 LAB — GLUCOSE, CAPILLARY
Glucose-Capillary: 285 mg/dL — ABNORMAL HIGH (ref 70–99)
Glucose-Capillary: 327 mg/dL — ABNORMAL HIGH (ref 70–99)

## 2013-06-09 MED ORDER — ASPIRIN-DIPYRIDAMOLE ER 25-200 MG PO CP12: 1.0000 | ORAL_CAPSULE | Freq: Two times a day (BID) | ORAL | Status: DC

## 2013-06-09 MED ORDER — INSULIN ASPART 100 UNIT/ML ~~LOC~~ SOLN: 6.0000 [IU] | Freq: Three times a day (TID) | SUBCUTANEOUS | Status: DC

## 2013-06-09 MED ORDER — ASPIRIN-DIPYRIDAMOLE ER 25-200 MG PO CP12
1.0000 | ORAL_CAPSULE | Freq: Two times a day (BID) | ORAL | Status: DC
  Administered 2013-06-09: 1 via ORAL
  Filled 2013-06-09 (×2): qty 1

## 2013-06-09 MED ORDER — INSULIN ASPART 100 UNIT/ML ~~LOC~~ SOLN: SUBCUTANEOUS | Status: DC

## 2013-06-09 NOTE — Telephone Encounter (Signed)
Attempted Transitional Care Call.  Left message for pt to return call.

## 2013-06-09 NOTE — Consult Note (Signed)
Referring Physician: Ghimire     Chief Complaint: stroke  HPI:                                                                                                                                         Regina [name] is an 72 y.o. female  s/p hospitalization 06/04/13-06/06/13 for DKA. According to patient's daughter Shawna Orleans, who was at the bedside (tel: [phone], patient seemed quite confuse when she picked her up from the hospital at 4:30 PM on 06/06/13. On getting home, she found CBG to 455. Patient went to bed at 10:30 PM on 06/06/13, but appeared awake at 12:30 AM. She was definitely still in bed by 1:00 AM on 06/07/13, but when daughter checked on her at about 5:00 AM, patient was no longer in bed, or elsewhere in the house. Daughter eventually found patient lyuing in the bushes outside their home, without any shoes on. Initial CT head was negative for stroke, bleed or intracranial abnormality. Due to confusion EEG was obtained which was normal. MRI was also obtained which resulted with a Small acute lacunar infarct in the medial right thalamus and questionable small punctate infarct in the central pons. .    A1C: 9.6 LDL: 142 (started on Zocor on previous admission) Doppler:pending   Date last known well: Unable to determine Time last known well: Unable to determine tPA Given: No: out of the window time frame.   Past Medical History  Diagnosis Date  . Hypertension   . Hypercholesteremia   . TIA (transient ischemic attack) 1990's    "mini strokes" (06/07/2013)  . Legally blind     "both eyes; some vision in the right' (06/07/2013)  . Heart murmur   . Hypothyroidism   . Type II diabetes mellitus   . Anemia   . GERD (gastroesophageal reflux disease)   . Arthritis     "left leg" (06/07/2013)  . Depression   . Vulva cancer   . Altered mental status     "the first time I noticed any problem was 2 d ago" (06/07/2013)    Past Surgical History  Procedure Laterality Date  . Femur fracture  surgery Left 1999  . Cesarean section  1968  . Tubal ligation  1970's  . Vulva surgery  1990's    "cut a big chunk out for cancer" (06/07/2013)    Family History  Problem Relation Age of Onset  . Hypertension Mother   . Hyperlipidemia Mother   . Hyperlipidemia Father   . Hypertension Father   . Diabetes Father    Social History:  reports that she quit smoking about 20 years ago. Her smoking use included Cigarettes. She has a 120 pack-year smoking history. She has never used smokeless tobacco. She reports that she does not drink alcohol or use illicit drugs.  Allergies:  Allergies  Allergen Reactions  . Amoxicillin  REACTION: rash  . Ciprofloxacin     REACTION: hives  . Codeine     REACTION: nervous, shaky  . Duloxetine     REACTION: reaction not known  . Hydrochlorothiazide W-Triamterene     REACTION: hyponatremia  . Ticlopidine Hcl     REACTION: bleeding    Medications:                                                                                                                           Prior to Admission:  Prescriptions prior to admission  Medication Sig Dispense Refill  . amLODipine-valsartan (EXFORGE) 10-320 MG per tablet Take 1 tablet by mouth daily.  30 tablet  0  . aspirin EC 81 MG tablet Take 81 mg by mouth daily.      Marland Kitchen atenolol (TENORMIN) 100 MG tablet Take 100 mg by mouth daily.      . Calcium Carbonate-Vit D-Min (CALCIUM 1200 PO) Take 1 tablet by mouth daily.      . carvedilol (COREG) 12.5 MG tablet Take 1 tablet (12.5 mg total) by mouth 2 (two) times daily with a meal.  60 tablet  0  . ezetimibe (ZETIA) 10 MG tablet Take 10 mg by mouth daily.      . ferrous fumarate (HEMOCYTE - 106 MG FE) 325 (106 FE) MG TABS tablet Take 1 tablet by mouth daily.      . insulin aspart (NOVOLOG) 100 UNIT/ML injection Inject 10 Units into the skin 3 (three) times daily before meals. 20 units in the am, 10 units at lunch and 10 units at night  1 vial  0  . insulin detemir  (LEVEMIR) 100 UNIT/ML injection Inject 0.45 mLs (45 Units total) into the skin 2 (two) times daily.  10 mL  3  . levothyroxine (SYNTHROID, LEVOTHROID) 75 MCG tablet Take 1 tablet (75 mcg total) by mouth daily before breakfast.  30 tablet  0  . liothyronine (CYTOMEL) 25 MCG tablet Take 25 mcg by mouth 2 (two) times daily.      . metFORMIN (GLUCOPHAGE) 500 MG tablet Take 1,000 mg by mouth 2 (two) times daily.      . Omega-3 Fatty Acids (FISH OIL) 1000 MG CAPS Take 2,000 mg by mouth daily.      . ranitidine (ZANTAC) 75 MG tablet Take 75 mg by mouth 2 (two) times daily.      . simvastatin (ZOCOR) 20 MG tablet Take 1 tablet (20 mg total) by mouth every evening.  30 tablet  0  . Travoprost, BAK Free, (TRAVATAN) 0.004 % SOLN ophthalmic solution Place 1 drop into both eyes at bedtime.       Scheduled: . amLODipine  10 mg Oral Daily  . aspirin EC  81 mg Oral Daily  . carvedilol  25 mg Oral BID WC  . ezetimibe  10 mg Oral Daily  . famotidine  20 mg Oral BID  . ferrous fumarate  1 tablet Oral Daily  .  heparin  5,000 Units Subcutaneous Q8H  . insulin aspart  0-5 Units Subcutaneous QHS  . insulin aspart  0-9 Units Subcutaneous TID WC  . insulin aspart  6 Units Subcutaneous TID WC  . insulin detemir  45 Units Subcutaneous BID  . irbesartan  300 mg Oral Daily  . latanoprost  1 drop Both Eyes QHS  . levothyroxine  75 mcg Oral QAC breakfast  . liothyronine  25 mcg Oral BID  . omega-3 acid ethyl esters  1 g Oral Daily  . simvastatin  20 mg Oral QPM  . sodium chloride  3 mL Intravenous Q12H    ROS:                                                                                                                                       History obtained from the patient  General ROS: negative for - chills, fatigue, fever, night sweats, weight gain or weight loss Psychological ROS: negative for - behavioral disorder, hallucinations, memory difficulties, mood swings or suicidal ideation Ophthalmic ROS:  negative for - blurry vision, double vision, eye pain or loss of vision ENT ROS: negative for - epistaxis, nasal discharge, oral lesions, sore throat, tinnitus or vertigo Allergy and Immunology ROS: negative for - hives or itchy/watery eyes Hematological and Lymphatic ROS: negative for - bleeding problems, bruising or swollen lymph nodes Endocrine ROS: negative for - galactorrhea, hair pattern changes, polydipsia/polyuria or temperature intolerance Respiratory ROS: negative for - cough, hemoptysis, shortness of breath or wheezing Cardiovascular ROS: negative for - chest pain, dyspnea on exertion, edema or irregular heartbeat Gastrointestinal ROS: negative for - abdominal pain, diarrhea, hematemesis, nausea/vomiting or stool incontinence Genito-Urinary ROS: negative for - dysuria, hematuria, incontinence or urinary frequency/urgency Musculoskeletal ROS: negative for - joint swelling or muscular weakness Neurological ROS: as noted in HPI Dermatological ROS: negative for rash and skin lesion changes  Neurologic Examination:                                                                                                      Blood pressure 151/70, pulse 67, temperature 98.1 F (36.7 C), temperature source Oral, resp. rate 18, height 5' (1.524 m), weight 103.919 kg (229 lb 1.6 oz), SpO2 98.00%.   Mental Status: Alert, oriented, thought content appropriate.  Speech fluent without evidence of aphasia.  Able to follow 3 step commands without difficulty. Cranial Nerves: II: Discs flat bilaterally; Visual fields grossly normal, pupils equal, round, reactive to light and accommodation  III,IV, VI: ptosis not present, extra-ocular motions intact bilaterally V,VII: smile symmetric with slight left NL fold decrease at rest, facial light touch sensation normal bilaterally VIII: hearing normal bilaterally IX,X: gag reflex present XI: bilateral shoulder shrug XII: midline tongue extension Motor: Right  : Upper extremity   5/5    Left:     Upper extremity   5/5  Lower extremity   4/5     Lower extremity   4/5 Tone and bulk:normal tone throughout; no atrophy noted Sensory: Pinprick and light touch decreased from mid calf to feet (known diabetes with A1c 9.6) Deep Tendon Reflexes:  Right: Upper Extremity   Left: Upper extremity   biceps (C-5 to C-6) 2/4   biceps (C-5 to C-6) 2/4 tricep (C7) 2/4    triceps (C7) 2/4 Brachioradialis (C6) 2/4  Brachioradialis (C6) 2/4  Lower Extremity Lower Extremity  quadriceps (L-2 to L-4) 0/4   quadriceps (L-2 to L-4) 0/4 Achilles (S1) 0/4   Achilles (S1) 0/4  Plantars: Right: downgoing   Left: downgoing Cerebellar: normal finger-to-nose,  normal heel-to-shin test but limited due to body habitus Gait: normal CV: pulses palpable throughout    Results for orders placed during the hospital encounter of 06/07/13 (from the past 48 hour(s))  POCT I-STAT TROPONIN I     Status: None   Collection Time    06/07/13 10:15 AM      Result Value Range   Troponin i, poc 0.05  0.00 - 0.08 ng/mL   Comment 3            Comment: Due to the release kinetics of cTnI,     a negative result within the first hours     of the onset of symptoms does not rule out     myocardial infarction with certainty.     If myocardial infarction is still suspected,     repeat the test at appropriate intervals.  URINALYSIS, ROUTINE W REFLEX MICROSCOPIC     Status: Abnormal   Collection Time    06/07/13 11:12 AM      Result Value Range   Color, Urine YELLOW  YELLOW   APPearance CLEAR  CLEAR   Specific Gravity, Urine 1.023  1.005 - 1.030   pH 6.0  5.0 - 8.0   Glucose, UA >1000 (*) NEGATIVE mg/dL   Hgb urine dipstick TRACE (*) NEGATIVE   Bilirubin Urine NEGATIVE  NEGATIVE   Ketones, ur NEGATIVE  NEGATIVE mg/dL   Protein, ur 161 (*) NEGATIVE mg/dL   Urobilinogen, UA 0.2  0.0 - 1.0 mg/dL   Nitrite NEGATIVE  NEGATIVE   Leukocytes, UA NEGATIVE  NEGATIVE  URINE MICROSCOPIC-ADD ON      Status: None   Collection Time    06/07/13 11:12 AM      Result Value Range   Squamous Epithelial / LPF RARE  RARE   WBC, UA 3-6  <3 WBC/hpf   RBC / HPF 0-2  <3 RBC/hpf   Bacteria, UA RARE  RARE  GLUCOSE, CAPILLARY     Status: Abnormal   Collection Time    06/07/13  2:32 PM      Result Value Range   Glucose-Capillary 363 (*) 70 - 99 mg/dL   Comment 1 Notify RN    GLUCOSE, CAPILLARY     Status: Abnormal   Collection Time    06/07/13  4:56 PM      Result Value Range   Glucose-Capillary 342 (*) 70 - 99 mg/dL  CBC  Status: None   Collection Time    06/07/13  6:14 PM      Result Value Range   WBC 7.5  4.0 - 10.5 K/uL   RBC 4.59  3.87 - 5.11 MIL/uL   Hemoglobin 14.3  12.0 - 15.0 g/dL   HCT 40.9  81.1 - 91.4 %   MCV 88.0  78.0 - 100.0 fL   MCH 31.2  26.0 - 34.0 pg   MCHC 35.4  30.0 - 36.0 g/dL   RDW 78.2  95.6 - 21.3 %   Platelets 177  150 - 400 K/uL  CREATININE, SERUM     Status: Abnormal   Collection Time    06/07/13  6:14 PM      Result Value Range   Creatinine, Ser 0.62  0.50 - 1.10 mg/dL   GFR calc non Af Amer 89 (*) >90 mL/min   GFR calc Af Amer >90  >90 mL/min   Comment: (NOTE)     The eGFR has been calculated using the CKD EPI equation.     This calculation has not been validated in all clinical situations.     eGFR's persistently <90 mL/min signify possible Chronic Kidney     Disease.  TSH     Status: Abnormal   Collection Time    06/07/13  6:14 PM      Result Value Range   TSH 0.071 (*) 0.350 - 4.500 uIU/mL   Comment: Performed at [facility]  HEMOGLOBIN A1C     Status: Abnormal   Collection Time    06/07/13  6:14 PM      Result Value Range   Hemoglobin A1C 9.6 (*) <5.7 %   Comment: (NOTE)                                                                               According to the ADA Clinical Practice Recommendations for 2011, when     HbA1c is used as a screening test:      >=6.5%   Diagnostic of Diabetes Mellitus               (if  abnormal result is confirmed)     5.7-6.4%   Increased risk of developing Diabetes Mellitus     References:Diagnosis and Classification of Diabetes Mellitus,Diabetes     Care,2011,34(Suppl 1):S62-S69 and Standards of Medical Care in             Diabetes - 2011,Diabetes Care,2011,34 (Suppl 1):S11-S61.   Mean Plasma Glucose 229 (*) <117 mg/dL   Comment: Performed at [facility]  FOLATE RBC     Status: Abnormal   Collection Time    06/07/13  6:14 PM      Result Value Range   RBC Folate 951 (*) >=366 ng/mL   Comment: Reference range not established for pediatric patients.     Performed at [facility]  RPR     Status: None   Collection Time    06/07/13  6:14 PM      Result Value Range   RPR NON REACTIVE  NON REACTIVE   Comment: Performed at [facility]  GLUCOSE, CAPILLARY  Status: Abnormal   Collection Time    06/07/13  8:55 PM      Result Value Range   Glucose-Capillary 323 (*) 70 - 99 mg/dL  GLUCOSE, CAPILLARY     Status: Abnormal   Collection Time    06/08/13  8:03 AM      Result Value Range   Glucose-Capillary 250 (*) 70 - 99 mg/dL  COMPREHENSIVE METABOLIC PANEL     Status: Abnormal   Collection Time    06/08/13  8:37 AM      Result Value Range   Sodium 140  135 - 145 mEq/L   Potassium 3.6  3.5 - 5.1 mEq/L   Chloride 103  96 - 112 mEq/L   CO2 29  19 - 32 mEq/L   Glucose, Bld 254 (*) 70 - 99 mg/dL   BUN 10  6 - 23 mg/dL   Creatinine, Ser 6.57  0.50 - 1.10 mg/dL   Calcium 8.9  8.4 - 84.6 mg/dL   Total Protein 6.0  6.0 - 8.3 g/dL   Albumin 3.0 (*) 3.5 - 5.2 g/dL   AST 17  0 - 37 U/L   ALT 17  0 - 35 U/L   Alkaline Phosphatase 67  39 - 117 U/L   Total Bilirubin 0.4  0.3 - 1.2 mg/dL   GFR calc non Af Amer 86 (*) >90 mL/min   GFR calc Af Amer >90  >90 mL/min   Comment: (NOTE)     The eGFR has been calculated using the CKD EPI equation.     This calculation has not been validated in all clinical situations.     eGFR's persistently <90 mL/min  signify possible Chronic Kidney     Disease.  CBC     Status: None   Collection Time    06/08/13  8:37 AM      Result Value Range   WBC 5.8  4.0 - 10.5 K/uL   RBC 4.40  3.87 - 5.11 MIL/uL   Hemoglobin 13.8  12.0 - 15.0 g/dL   HCT 96.2  95.2 - 84.1 %   MCV 88.6  78.0 - 100.0 fL   MCH 31.4  26.0 - 34.0 pg   MCHC 35.4  30.0 - 36.0 g/dL   RDW 32.4  40.1 - 02.7 %   Platelets 184  150 - 400 K/uL  GLUCOSE, CAPILLARY     Status: Abnormal   Collection Time    06/08/13 12:15 PM      Result Value Range   Glucose-Capillary 273 (*) 70 - 99 mg/dL  GLUCOSE, CAPILLARY     Status: Abnormal   Collection Time    06/08/13  5:34 PM      Result Value Range   Glucose-Capillary 231 (*) 70 - 99 mg/dL  GLUCOSE, CAPILLARY     Status: Abnormal   Collection Time    06/08/13  9:55 PM      Result Value Range   Glucose-Capillary 156 (*) 70 - 99 mg/dL  GLUCOSE, CAPILLARY     Status: Abnormal   Collection Time    06/09/13  7:59 AM      Result Value Range   Glucose-Capillary 106 (*) 70 - 99 mg/dL   Dg Knee 2 Views Left  06/07/2013   CLINICAL DATA:  No recent injury. Pain.  EXAM: LEFT KNEE - 1-2 VIEW  COMPARISON:  None.  FINDINGS: Advanced degenerative changes. Intra medullary rod partially imaged across a healed distal femoral fracture. No acute  fracture, subluxation or dislocation. Small joint effusion.  IMPRESSION: Advanced degenerative changes. No acute findings.   Electronically Signed   By: Charlett Nose   On: 06/07/2013 11:17   Ct Head Wo Contrast  06/07/2013   CLINICAL DATA:  Altered mental status. Fall.  EXAM: CT HEAD WITHOUT CONTRAST  TECHNIQUE: Contiguous axial images were obtained from the base of the skull through the vertex without intravenous contrast.  COMPARISON:  05/15/2011  FINDINGS: There is atrophy and chronic small vessel disease changes. No acute intracranial abnormality. Specifically, no hemorrhage, hydrocephalus, mass lesion, acute infarction, or significant intracranial injury. No acute  calvarial abnormality. Visualized paranasal sinuses and mastoids clear. Orbital soft tissues unremarkable.  IMPRESSION: No acute intracranial abnormality.  Atrophy, chronic microvascular disease.   Electronically Signed   By: Charlett Nose   On: 06/07/2013 11:40   Mr Brain Wo Contrast  06/08/2013   CLINICAL DATA:  72 year old female with altered mental status, confusion. Recent hospitalization for diabetic ketoacidosis.  EXAM: MRI HEAD WITHOUT CONTRAST  TECHNIQUE: Multiplanar, multisequence MR imaging was performed. No intravenous contrast was administered.  COMPARISON:  Head CTs 06/07/2013 and earlier.  FINDINGS: 8 mm focus of restricted diffusion in the medial right thalamus (series 4, image 14). Subtle associated T2 and FLAIR hyperintensity. No mass effect or associated hemorrhage.  Questionable superimposed punctate focus of restricted diffusion in the central pons. There is an nearby a chronic lacunar infarct in the right paracentral pons.  No other restricted diffusion. Major intracranial vascular flow voids are preserved. No midline shift, mass effect, evidence of mass lesion, ventriculomegaly, extra-axial collection or acute intracranial hemorrhage. Cervicomedullary junction and pituitary are within normal limits. Negative visualized cervical spine. There is evidence of a small chronic lacunar infarct in the posterior right external capsule. Aside from this in the above described in the pons there is only mild for age patchy nonspecific cerebral white matter T2 and FLAIR hyperintensity.  Postoperative changes to the globes. Mild right mastoid effusion. Negative nasopharynx. Visualized bone marrow signal is within normal limits. Negative scalp soft tissues.  IMPRESSION: 1. Small acute lacunar infarct in the medial right thalamus. No associated mass effect or hemorrhage.  2. Questionable additional punctate acute lacunar infarct in the central pons. This is near a chronic right paracentral pontine lacunar  infarct.  3.  Otherwise mild for age signal changes in the brain.   Electronically Signed   By: Augusto Gamble   On: 06/08/2013 17:37   Portable Chest 1 View  06/07/2013   *RADIOLOGY REPORT*  Clinical Data: Altered mental status.  PORTABLE CHEST - 1 VIEW  Comparison: 06/04/2013.  Findings: Trachea is midline.  Heart size stable.  Lungs are clear. No pleural fluid.  IMPRESSION: No acute findings.   Original Report Authenticated By: Leanna Battles, M.D.   EEG: no epileptiform activity  2 D echo 5.6.14 Study Conclusions  - Left ventricle: The cavity size was normal. There was moderate concentric hypertrophy. Systolic function was normal. The estimated ejection fraction was in the range of 55% to 60%. Wall motion was normal; there were no regional wall motion abnormalities. Doppler parameters are consistent with abnormal left ventricular relaxation (grade 1 diastolic dysfunction). Doppler parameters are consistent with elevated mean left atrial filling pressure. - Aortic valve: There was moderate stenosis. Valve area: 1.09cm^2(VTI). - Mitral valve: Calcified annulus. Mild regurgitation. Valve area by pressure half-time: 2.24cm^2. Valve area by continuity equation (using LVOT flow): 2.06cm^2. - Left atrium: The atrium was severely dilated. - Pulmonary arteries:  PA peak pressure: 32mm Hg (S).  Felicie Morn PA-C [facility] [phone]  06/09/2013, 9:43 AM  Patient seen and examined.  Clinical course and management discussed.  Necessary edits performed.  I agree with the above.  Assessment and plan of care developed and discussed below.     Assessment: 72 y.o. female presenting with confusion.  MRI of the brain performed and reviewed.  MRI shows a right thalamic and possibly right pontine acute infarct.  Work up performed include a echocardiogram that is unremarkable.  Carotid dopplers show no significant stenosis.  LDL elevated but statin recently initiated.  A1c elevated as well.   Patient has had platelet therpay addressed and has been changed to Aggrenox.    Stroke Risk Factors - diabetes mellitus, hyperlipidemia and hypertension  Recommendations: 1.  MRA of the brain.  Since infarcts noted are in the posterior circulation.  This should be visualized.  Radiology has been contacted and should be performed prior to discharge today, otherwise may be performed as an outpatient since patient likely symptomatic from small vessel disease based on risk factors present.   2.  Agree with Aggrenox.   3.  May follow up with neurology as an outpatient.     Thana Farr, MD [facility] [phone]  06/09/2013  2:03 PM

## 2013-06-09 NOTE — Clinical Social Work Placement (Signed)
Clinical Social Work Department CLINICAL SOCIAL WORK PLACEMENT NOTE 06/09/2013  Patient:  Regina [name], Regina [name]  Account Number:  [id number] Admit date:  06/07/2013  Clinical Social Worker:  Cherre Blanc, [place]  Date/time:  06/08/2013 11:00 AM  Clinical Social Work is seeking post-discharge placement for this patient at the following level of care:   SKILLED NURSING   (*CSW will update this form in Epic as items are completed)   06/08/2013  Patient/family provided with [facility] Department of Clinical Social Work's list of facilities offering this level of care within the geographic area requested by the patient (or if unable, by the patient's family).  06/01/2013  Patient/family informed of their freedom to choose among providers that offer the needed level of care, that participate in Medicare, Medicaid or managed care program needed by the patient, have an available bed and are willing to accept the patient.  06/08/2013  Patient/family informed of [facility]' ownership interest in [facility], as well as of the fact that they are under no obligation to receive care at this facility.  PASARR submitted to EDS on 06/08/2013 PASARR number received from EDS on 06/08/2013  FL2 transmitted to all facilities in geographic area requested by pt/family on  06/08/2013 FL2 transmitted to all facilities within larger geographic area on   Patient informed that his/her managed care company has contracts with or will negotiate with  certain facilities, including the following:     Patient/family informed of bed offers received:  06/08/2013 Patient chooses bed at [facility] Physician recommends and patient chooses bed at    Patient to be transferred to [facility] on  06/09/2013 Patient to be transferred to facility by Ambulance  The following physician request were entered in Epic:   Additional Comments: Per MD patient ready for DC 06/09/13. CSW has requested ambulance  transport to take patient to [facility] on 06/09/13. Patient, daughter, and facility notified of patient's DC. No other CSW needs identified. CSW signing off.   [name], [place], [place], [phone]

## 2013-06-09 NOTE — Clinical Social Work Note (Signed)
CSW requested ambulance transported for 5:00PM to transport patient to [facility]. RN notified.  [name], [place], [place], [phone]

## 2013-06-09 NOTE — Progress Notes (Signed)
06/09/13 Patient discharged today. IV site removed. Discharge papers reviewed with patient.

## 2013-06-09 NOTE — Discharge Summary (Addendum)
PATIENT DETAILS Name: Regina [name] Age: 72 y.o. Sex: female Date of Birth: 1941/02/12 MRN: [id number]. Admit Date: 06/07/2013 Admitting Physician: Laveda Norman, MD WUJ:[id number] DENNIS, MD  Recommendations for Outpatient Follow-up:  1. Please recheck lipid panel in 3 months 2. Please repeat A1c in 3-6 months 3. Please repeat TSH in 3 month  4. Optimize glycemic control 5. Optimize blood pressure control 6. Please refer to Neurology as outpatient  PRIMARY DISCHARGE DIAGNOSIS:  Active Problems:   Altered mental status   Uncontrolled diabetes mellitus   HTN (hypertension)   Hypothyroidism   Anxiety and depression   CVA (cerebral infarction)      PAST MEDICAL HISTORY: Past Medical History  Diagnosis Date  . Hypertension   . Hypercholesteremia   . TIA (transient ischemic attack) 1990's    "mini strokes" (06/07/2013)  . Legally blind     "both eyes; some vision in the right' (06/07/2013)  . Heart murmur   . Hypothyroidism   . Type II diabetes mellitus   . Anemia   . GERD (gastroesophageal reflux disease)   . Arthritis     "left leg" (06/07/2013)  . Depression   . Vulva cancer   . Altered mental status     "the first time I noticed any problem was 2 d ago" (06/07/2013)    DISCHARGE MEDICATIONS:   Medication List    STOP taking these medications       aspirin EC 81 MG tablet     atenolol 100 MG tablet  Commonly known as:  TENORMIN      TAKE these medications       amLODipine-valsartan 10-320 MG per tablet  Commonly known as:  EXFORGE  Take 1 tablet by mouth daily.     CALCIUM 1200 PO  Take 1 tablet by mouth daily.     carvedilol 12.5 MG tablet  Commonly known as:  COREG  Take 1 tablet (12.5 mg total) by mouth 2 (two) times daily with a meal.     dipyridamole-aspirin 200-25 MG per 12 hr capsule  Commonly known as:  AGGRENOX  Take 1 capsule by mouth 2 (two) times daily.     ezetimibe 10 MG tablet  Commonly known as:  ZETIA  Take 10 mg by mouth daily.      ferrous fumarate 325 (106 FE) MG Tabs tablet  Commonly known as:  HEMOCYTE - 106 mg FE  Take 1 tablet by mouth daily.     Fish Oil 1000 MG Caps  Take 2,000 mg by mouth daily.     insulin aspart 100 UNIT/ML injection  Commonly known as:  novoLOG  - 0-9 Units, Subcutaneous, 3 times daily with meals  - CBG < 70: implement hypoglycemia protocol  - CBG 70 - 120: 0 units  - CBG 121 - 150: 1 unit  - CBG 151 - 200: 2 units  - CBG 201 - 250: 3 units  - CBG 251 - 300: 5 units  - CBG 301 - 350: 7 units  - CBG 351 - 400: 9 units  - CBG > 400: call MD     insulin aspart 100 UNIT/ML injection  Commonly known as:  novoLOG  Inject 6 Units into the skin 3 (three) times daily with meals.     insulin detemir 100 UNIT/ML injection  Commonly known as:  LEVEMIR  Inject 0.45 mLs (45 Units total) into the skin 2 (two) times daily.     levothyroxine 75 MCG tablet  Commonly  known as:  SYNTHROID, LEVOTHROID  Take 1 tablet (75 mcg total) by mouth daily before breakfast.     liothyronine 25 MCG tablet  Commonly known as:  CYTOMEL  Take 25 mcg by mouth 2 (two) times daily.     metFORMIN 500 MG tablet  Commonly known as:  GLUCOPHAGE  Take 1,000 mg by mouth 2 (two) times daily.     ranitidine 75 MG tablet  Commonly known as:  ZANTAC  Take 75 mg by mouth 2 (two) times daily.     simvastatin 20 MG tablet  Commonly known as:  ZOCOR  Take 1 tablet (20 mg total) by mouth every evening.     Travoprost (BAK Free) 0.004 % Soln ophthalmic solution  Commonly known as:  TRAVATAN  Place 1 drop into both eyes at bedtime.        ALLERGIES:   Allergies  Allergen Reactions  . Amoxicillin     REACTION: rash  . Ciprofloxacin     REACTION: hives  . Codeine     REACTION: nervous, shaky  . Duloxetine     REACTION: reaction not known  . Hydrochlorothiazide W-Triamterene     REACTION: hyponatremia  . Ticlopidine Hcl     REACTION: bleeding    BRIEF HPI:  See H&P, Labs, Consult and  Test reports for all details in brief, patient was readmitted for confusion and uncontrolled diabetes. She was just discharged from the hospital one day prior to this admission. Her previous admission was for diabetic ketoacidosis.  CONSULTATIONS:   neurology  PERTINENT RADIOLOGIC STUDIES: Dg Chest 2 View  06/04/2013   *RADIOLOGY REPORT*  Clinical Data: Hyperglycemia.  CHEST - 2 VIEW  Comparison: Two-view chest x-ray 12/20/2012, 08/07/2010 [facility].  Findings: Cardiac silhouette moderately enlarged but stable, allowing for differences in technique.  Thoracic aorta mildly atherosclerotic, unchanged.  Hilar and mediastinal contours otherwise unremarkable.  Lungs clear.  Bronchovascular markings normal.  Pulmonary vascularity normal.  No pneumothorax.  No pleural effusions.  Degenerative changes and DISH involving the thoracic spine.  No significant interval change.  IMPRESSION: Cardiomegaly.  No acute cardiopulmonary disease.  Stable examination.   Original Report Authenticated By: Hulan Saas, M.D.   Dg Knee 2 Views Left  06/07/2013   CLINICAL DATA:  No recent injury. Pain.  EXAM: LEFT KNEE - 1-2 VIEW  COMPARISON:  None.  FINDINGS: Advanced degenerative changes. Intra medullary rod partially imaged across a healed distal femoral fracture. No acute fracture, subluxation or dislocation. Small joint effusion.  IMPRESSION: Advanced degenerative changes. No acute findings.   Electronically Signed   By: Charlett Nose   On: 06/07/2013 11:17   Ct Head Wo Contrast  06/07/2013   CLINICAL DATA:  Altered mental status. Fall.  EXAM: CT HEAD WITHOUT CONTRAST  TECHNIQUE: Contiguous axial images were obtained from the base of the skull through the vertex without intravenous contrast.  COMPARISON:  05/15/2011  FINDINGS: There is atrophy and chronic small vessel disease changes. No acute intracranial abnormality. Specifically, no hemorrhage, hydrocephalus, mass lesion, acute infarction, or significant  intracranial injury. No acute calvarial abnormality. Visualized paranasal sinuses and mastoids clear. Orbital soft tissues unremarkable.  IMPRESSION: No acute intracranial abnormality.  Atrophy, chronic microvascular disease.   Electronically Signed   By: Charlett Nose   On: 06/07/2013 11:40   Mr Brain Wo Contrast  06/08/2013   CLINICAL DATA:  72 year old female with altered mental status, confusion. Recent hospitalization for diabetic ketoacidosis.  EXAM: MRI HEAD WITHOUT CONTRAST  TECHNIQUE:  Multiplanar, multisequence MR imaging was performed. No intravenous contrast was administered.  COMPARISON:  Head CTs 06/07/2013 and earlier.  FINDINGS: 8 mm focus of restricted diffusion in the medial right thalamus (series 4, image 14). Subtle associated T2 and FLAIR hyperintensity. No mass effect or associated hemorrhage.  Questionable superimposed punctate focus of restricted diffusion in the central pons. There is an nearby a chronic lacunar infarct in the right paracentral pons.  No other restricted diffusion. Major intracranial vascular flow voids are preserved. No midline shift, mass effect, evidence of mass lesion, ventriculomegaly, extra-axial collection or acute intracranial hemorrhage. Cervicomedullary junction and pituitary are within normal limits. Negative visualized cervical spine. There is evidence of a small chronic lacunar infarct in the posterior right external capsule. Aside from this in the above described in the pons there is only mild for age patchy nonspecific cerebral white matter T2 and FLAIR hyperintensity.  Postoperative changes to the globes. Mild right mastoid effusion. Negative nasopharynx. Visualized bone marrow signal is within normal limits. Negative scalp soft tissues.  IMPRESSION: 1. Small acute lacunar infarct in the medial right thalamus. No associated mass effect or hemorrhage.  2. Questionable additional punctate acute lacunar infarct in the central pons. This is near a chronic right  paracentral pontine lacunar infarct.  3.  Otherwise mild for age signal changes in the brain.   Electronically Signed   By: Augusto Gamble   On: 06/08/2013 17:37   Portable Chest 1 View  06/07/2013   *RADIOLOGY REPORT*  Clinical Data: Altered mental status.  PORTABLE CHEST - 1 VIEW  Comparison: 06/04/2013.  Findings: Trachea is midline.  Heart size stable.  Lungs are clear. No pleural fluid.  IMPRESSION: No acute findings.   Original Report Authenticated By: Leanna Battles, M.D.     PERTINENT LAB RESULTS: CBC:  Recent Labs  06/07/13 1814 06/08/13 0837  WBC 7.5 5.8  HGB 14.3 13.8  HCT 40.4 39.0  PLT 177 184   CMET CMP     Component Value Date/Time   NA 140 06/08/2013 0837   K 3.6 06/08/2013 0837   CL 103 06/08/2013 0837   CO2 29 06/08/2013 0837   GLUCOSE 254* 06/08/2013 0837   BUN 10 06/08/2013 0837   CREATININE 0.69 06/08/2013 0837   CALCIUM 8.9 06/08/2013 0837   PROT 6.0 06/08/2013 0837   ALBUMIN 3.0* 06/08/2013 0837   AST 17 06/08/2013 0837   ALT 17 06/08/2013 0837   ALKPHOS 67 06/08/2013 0837   BILITOT 0.4 06/08/2013 0837   GFRNONAA 86* 06/08/2013 0837   GFRAA >90 06/08/2013 0837    GFR Estimated Creatinine Clearance: 71.2 ml/min (by C-G formula based on Cr of 0.69). No results found for this basename: LIPASE, AMYLASE,  in the last 72 hours No results found for this basename: CKTOTAL, CKMB, CKMBINDEX, TROPONINI,  in the last 72 hours No components found with this basename: POCBNP,  No results found for this basename: DDIMER,  in the last 72 hours  Recent Labs  06/07/13 1814  HGBA1C 9.6*   No results found for this basename: CHOL, HDL, LDLCALC, TRIG, CHOLHDL, LDLDIRECT,  in the last 72 hours  Recent Labs  06/07/13 1814  TSH 0.071*   No results found for this basename: VITAMINB12, FOLATE, FERRITIN, TIBC, IRON, RETICCTPCT,  in the last 72 hours Coags: No results found for this basename: PT, INR,  in the last 72 hours Microbiology: Recent Results (from the past 240 hour(s))   URINE CULTURE     Status: None  Collection Time    06/04/13  1:36 PM      Result Value Range Status   Specimen Description URINE, RANDOM   Final   Special Requests NONE   Final   Culture  Setup Time     Final   Value: 06/04/2013 23:17     Performed at [facility] Count     Final   Value: 30,000 COLONIES/ML     Performed at [facility]   Culture     Final   Value: Multiple bacterial morphotypes present, none predominant. Suggest appropriate recollection if clinically indicated.     Performed at [facility]   Report Status 06/05/2013 FINAL   Final  MRSA PCR SCREENING     Status: None   Collection Time    06/04/13  2:38 PM      Result Value Range Status   MRSA by PCR NEGATIVE  NEGATIVE Final   Comment:            The GeneXpert MRSA Assay (FDA     approved for NASAL specimens     only), is one component of a     comprehensive MRSA colonization     surveillance program. It is not     intended to diagnose MRSA     infection nor to guide or     monitor treatment for     MRSA infections.     BRIEF HOSPITAL COURSE:  Altered mental status  - Not sure the exact etiology was, however the patient is back to her usual self. Suspect mild dementia at baseline, worsened by perhaps uncontrolled diabetes as her sugars was significantly high on the admission. Some component of metabolic encephalopathy is suspected - No fever, UA negative for UTI, chest x-ray negative for pneumonia-no other clinical foci of infection evident  - Does not appear to be on any medications that can be blamed as well  - MRI of the brain showed a small foci of CVA -lacunar infarct in the right medial thalamus. At this time, this is suspected to be incidental finding, I doubt that it was the cause of the patient's altered mental status. - EEG was also done, it was unremarkable for any significant abnormalities  Uncontrolled diabetes mellitus  - Recent admission for DKA, was  discharged on Levemir 45 units twice a day and 10 units of NovoLog 3 times a day  - CBGs CBGs significantly better with Levemir 45 units twice a day, 6 units of NovoLog with meals, and SSI-this will be continued on discharge.  Acute CVA - Please see above - Recent echocardiogram done on 02/14/13 showed preserved EF with grade 1 diastolic dysfunction. Hence it was not repeated - EKG on admission showed sinus rhythm - Carotid Dopplers for the results-showed-Bilateral: 1-39% ICA stenosis. Vertebral artery flow is antegrade. She will need periodic surveillance - Hemoglobin A1c on 8/27 was 9.6 - LDL on 8/27 was 142 - Since patient was already on aspirin, and since he has a documented allergy to Ticlopidine, we will switch her over to Aggrenox -Patient was seen by Neurology, MRA brain was ordered, which showed diffuse small vessel disease, these images and report were reviewed with Dr Thad Ranger, who had no further recommendations at this point, and patient was stable to discharge from her point of view as well.  Dyslipidemia  - Continue on Zetia and simvastatin  - Please recheck lipid panel in 3 months  Hypothyroidism  - Continue levothyroxine  Hypertension  - Uncontrolled  - Continue with Avapro , amlodipine, appears patient on both Coreg and atenolol- will stop atenolol. BP is currently moderately controlled, further optimization can be done in the outpatient setting  Systolic Murmur  Likely related to moderate AS noted on echo May 2014 . Follows with [facility] as outpt   Hx of TIA  Continue zocor - Since she had a small CVA while on aspirin, we will switch to Aggrenox as the patient has allergy to Ticlid and will not put patient on Plavix   TODAY-DAY OF DISCHARGE:  Subjective:   Regina [name] today has no headache,no chest abdominal pain,no new weakness tingling or numbness, feels much better wants to go home today.   Objective:   Blood pressure 143/73, pulse 78, temperature 98 F (36.7  C), temperature source Oral, resp. rate 20, height 5' (1.524 m), weight 103.919 kg (229 lb 1.6 oz), SpO2 98.00%.  Intake/Output Summary (Last 24 hours) at 06/09/13 1604 Last data filed at 06/09/13 1500  Gross per 24 hour  Intake    720 ml  Output      2 ml  Net    718 ml   Filed Weights   06/07/13 1712 06/08/13 0149  Weight: 103.42 kg (228 lb) 103.919 kg (229 lb 1.6 oz)    Exam Awake Alert, Oriented *3, No new F.N deficits, Normal affect [place].AT,PERRAL Supple Neck,No JVD, No cervical lymphadenopathy appriciated.  Symmetrical Chest wall movement, Good air movement bilaterally, CTAB RRR,No Gallops,Rubs or new Murmurs, No Parasternal Heave +ve B.Sounds, Abd Soft, Non tender, No organomegaly appriciated, No rebound -guarding or rigidity. No Cyanosis, Clubbing or edema, No new Rash or bruise  DISCHARGE CONDITION: Stable  DISPOSITION: SNF  DISCHARGE INSTRUCTIONS:    Activity:  As tolerated with Full fall precautions use walker/cane & assistance as needed  Diet recommendation: Diabetic Diet Heart Healthy diet  Discharge Orders   Future Appointments Provider Department Dept Phone   07/04/2013 9:00 AM Carlus Pavlov, MD [facility] PRIMARY CARE ENDOCRINOLOGY [phone]   Future Orders Complete By Expires   Call MD for:  persistant nausea and vomiting  As directed    Call MD for:  severe uncontrolled pain  As directed    Diet - low sodium heart healthy  As directed    Diet Carb Modified  As directed    Increase activity slowly  As directed       Follow-up Information   Follow up with Michiel Sites, MD. Schedule an appointment as soon as possible for a visit in 1 week. (after discharge from SNF)    Specialty:  Endocrinology   Contact information:   [address] [place] [address] [phone]       Total Time spent on discharge equals 45 minutes.  SignedJeoffrey Massed 06/09/2013 4:04 PM

## 2013-06-09 NOTE — Progress Notes (Signed)
*  PRELIMINARY RESULTS* Vascular Ultrasound Carotid Duplex (Doppler) has been completed.  Preliminary findings: Bilateral:  1-39% ICA stenosis.  Vertebral artery flow is antegrade.      Farrel Demark, RDMS, RVT  06/09/2013, 10:16 AM

## 2013-06-15 ENCOUNTER — Non-Acute Institutional Stay (SKILLED_NURSING_FACILITY): Payer: Medicare Other | Admitting: Internal Medicine

## 2013-06-15 ENCOUNTER — Encounter: Payer: Self-pay | Admitting: Internal Medicine

## 2013-06-15 DIAGNOSIS — I10 Essential (primary) hypertension: Secondary | ICD-10-CM

## 2013-06-15 DIAGNOSIS — D509 Iron deficiency anemia, unspecified: Secondary | ICD-10-CM

## 2013-06-15 DIAGNOSIS — E785 Hyperlipidemia, unspecified: Secondary | ICD-10-CM

## 2013-06-15 DIAGNOSIS — E039 Hypothyroidism, unspecified: Secondary | ICD-10-CM

## 2013-06-15 DIAGNOSIS — I639 Cerebral infarction, unspecified: Secondary | ICD-10-CM

## 2013-06-15 DIAGNOSIS — I6350 Cerebral infarction due to unspecified occlusion or stenosis of unspecified cerebral artery: Secondary | ICD-10-CM

## 2013-06-15 DIAGNOSIS — R5381 Other malaise: Secondary | ICD-10-CM

## 2013-06-15 DIAGNOSIS — F329 Major depressive disorder, single episode, unspecified: Secondary | ICD-10-CM

## 2013-06-15 DIAGNOSIS — E119 Type 2 diabetes mellitus without complications: Secondary | ICD-10-CM

## 2013-06-15 DIAGNOSIS — R531 Weakness: Secondary | ICD-10-CM | POA: Insufficient documentation

## 2013-06-15 NOTE — Telephone Encounter (Signed)
Attempted to call pt again to schedule hospital f/u visit with no response.  There is a note in Epic from today (9/4) that looks like pt is at [facility] for rehab.

## 2013-06-15 NOTE — Telephone Encounter (Signed)
[place]

## 2013-06-15 NOTE — Progress Notes (Signed)
Patient ID: Regina [name], female   DOB: August 01, 1941, 72 y.o.   MRN: [id number]  [facility]   PCP: Michiel Sites, MD  Code Status: DNR  Allergies  Allergen Reactions  . Amoxicillin     REACTION: rash  . Ciprofloxacin     REACTION: hives  . Codeine     REACTION: nervous, shaky  . Duloxetine     REACTION: reaction not known  . Hydrochlorothiazide W-Triamterene     REACTION: hyponatremia  . Ticlopidine Hcl     REACTION: bleeding    Chief Complaint: new admit  HPI:  72 y/o female patient was admitted to the hospital with altered mental status and hyperglycemia. She has hx of dm type 2. No clear etiology for her confusion was identified. There were concerns of her having mild dementia and her hyperglycemia contributing some to her confusion. Neurology was consulted. Infection was ruled out. MRI brain showed a small foci of CVA with lacunar infarct in right medical thalamus. EEG was unremarkable. She was switched to aggrenox from ASA. She was then sent to SNF She was seen in her room today. She has flat affect and was seen by psychiatry service today. She has been started on lexapro by them. She mentions having pin and needle pain in both her legs for few days. No other complaints. She feels low in terms of her energy and feels depressed. Denies suicidal ideation  Review of Systems:  denies fever or chills Denies chest pain, dyspnea, URI symptoms Denies abdominal pain, nausea and vomiting Denies constipation, diarrhea, abdominal pain Denies dysuria Denies headache or blurred vision No back or joint pain No falls  Past Medical History  Diagnosis Date  . Hypertension   . Hypercholesteremia   . TIA (transient ischemic attack) 1990's    "mini strokes" (06/07/2013)  . Legally blind     "both eyes; some vision in the right' (06/07/2013)  . Heart murmur   . Hypothyroidism   . Type II diabetes mellitus   . Anemia   . GERD (gastroesophageal reflux disease)   . Arthritis      "left leg" (06/07/2013)  . Depression   . Vulva cancer   . Altered mental status     "the first time I noticed any problem was 2 d ago" (06/07/2013)   Past Surgical History  Procedure Laterality Date  . Femur fracture surgery Left 1999  . Cesarean section  1968  . Tubal ligation  1970's  . Vulva surgery  1990's    "cut a big chunk out for cancer" (06/07/2013)   Social History:   reports that she quit smoking about 20 years ago. Her smoking use included Cigarettes. She has a 120 pack-year smoking history. She has never used smokeless tobacco. She reports that she does not drink alcohol or use illicit drugs.  Family History  Problem Relation Age of Onset  . Hypertension Mother   . Hyperlipidemia Mother   . Hyperlipidemia Father   . Hypertension Father   . Diabetes Father     Medications: Patient's Medications  New Prescriptions   No medications on file  Previous Medications   AMLODIPINE-VALSARTAN (EXFORGE) 10-320 MG PER TABLET    Take 1 tablet by mouth daily.   CALCIUM CARBONATE-VIT D-MIN (CALCIUM 1200 PO)    Take 1 tablet by mouth daily.   CARVEDILOL (COREG) 12.5 MG TABLET    Take 1 tablet (12.5 mg total) by mouth 2 (two) times daily with a meal.   DIPYRIDAMOLE-ASPIRIN (  AGGRENOX) 200-25 MG PER 12 HR CAPSULE    Take 1 capsule by mouth 2 (two) times daily.   EZETIMIBE (ZETIA) 10 MG TABLET    Take 10 mg by mouth daily.   FERROUS FUMARATE (HEMOCYTE - 106 MG FE) 325 (106 FE) MG TABS TABLET    Take 1 tablet by mouth daily.   INSULIN ASPART (NOVOLOG) 100 UNIT/ML INJECTION    0-9 Units, Subcutaneous, 3 times daily with meals CBG < 70: implement hypoglycemia protocol CBG 70 - 120: 0 units CBG 121 - 150: 1 unit CBG 151 - 200: 2 units CBG 201 - 250: 3 units CBG 251 - 300: 5 units CBG 301 - 350: 7 units CBG 351 - 400: 9 units CBG > 400: call MD   INSULIN ASPART (NOVOLOG) 100 UNIT/ML INJECTION    Inject 6 Units into the skin 3 (three) times daily with meals.   INSULIN DETEMIR (LEVEMIR)  100 UNIT/ML INJECTION    Inject 0.45 mLs (45 Units total) into the skin 2 (two) times daily.   LEVOTHYROXINE (SYNTHROID, LEVOTHROID) 75 MCG TABLET    Take 1 tablet (75 mcg total) by mouth daily before breakfast.   LIOTHYRONINE (CYTOMEL) 25 MCG TABLET    Take 25 mcg by mouth 2 (two) times daily.   METFORMIN (GLUCOPHAGE) 500 MG TABLET    Take 1,000 mg by mouth 2 (two) times daily.   OMEGA-3 FATTY ACIDS (FISH OIL) 1000 MG CAPS    Take 2,000 mg by mouth daily.   RANITIDINE (ZANTAC) 75 MG TABLET    Take 75 mg by mouth 2 (two) times daily.   SIMVASTATIN (ZOCOR) 20 MG TABLET    Take 1 tablet (20 mg total) by mouth every evening.   TRAVOPROST, BAK FREE, (TRAVATAN) 0.004 % SOLN OPHTHALMIC SOLUTION    Place 1 drop into both eyes at bedtime.  Modified Medications   No medications on file  Discontinued Medications   No medications on file     Physical Exam: Filed Vitals:   06/15/13 1559  BP: 124/72  Pulse: 78  Temp: 96.9 F (36.1 C)  Resp: 18  SpO2: 96%   gen- adult female, overweight, in NAD Psych- flat affect, avoids eye contact Neuro- aaox 3, no focal deficit Ext- able to move all 4, using a wheelchair at present, trace b/l edema cvs- normal s1,s2, rrr respi- CTAB abdo- bs+, soft, non tender   Labs reviewed: Basic Metabolic Panel:  Recent Labs  16/10/96 0517 06/07/13 0943 06/07/13 1814 06/08/13 0837  NA 141 135  --  140  K 4.1 3.9  --  3.6  CL 106 100  --  103  CO2 23 22  --  29  GLUCOSE 56* 480*  --  254*  BUN 9 10  --  10  CREATININE 0.67 0.76 0.62 0.69  CALCIUM 9.3 9.1  --  8.9   Liver Function Tests:  Recent Labs  06/04/13 1100 06/07/13 0943 06/08/13 0837  AST 10 22 17   ALT 18 19 17   ALKPHOS 68 71 67  BILITOT 0.6 0.5 0.4  PROT 6.7 6.7 6.0  ALBUMIN 3.4* 3.2* 3.0*   CBC:  Recent Labs  06/04/13 1100 06/07/13 0943 06/07/13 1814 06/08/13 0837  WBC 11.2* 8.1 7.5 5.8  NEUTROABS 8.1* 5.8  --   --   HGB 13.3 14.6 14.3 13.8  HCT 39.3 41.5 40.4 39.0  MCV  91.8 87.6 88.0 88.6  PLT 232 202 177 184   Cardiac Enzymes:  Recent  Labs  06/04/13 1100  TROPONINI <0.30   BNP: No components found with this basename: POCBNP,  CBG:  Recent Labs  06/09/13 0759 06/09/13 1148 06/09/13 1706  GLUCAP 106* 285* 327*    Radiological Exams:  Dg Chest 2 View  06/04/2013   *RADIOLOGY REPORT*  Clinical Data: Hyperglycemia.  CHEST - 2 VIEW  Comparison: Two-view chest x-ray 12/20/2012, 08/07/2010 [facility].  Findings: Cardiac silhouette moderately enlarged but stable, allowing for differences in technique.  Thoracic aorta mildly atherosclerotic, unchanged.  Hilar and mediastinal contours otherwise unremarkable.  Lungs clear.  Bronchovascular markings normal.  Pulmonary vascularity normal.  No pneumothorax.  No pleural effusions.  Degenerative changes and DISH involving the thoracic spine.  No significant interval change.  IMPRESSION: Cardiomegaly.  No acute cardiopulmonary disease.  Stable examination.   Original Report Authenticated By: Hulan Saas, M.D.   Dg Knee 2 Views Left  06/07/2013   CLINICAL DATA:  No recent injury. Pain.  EXAM: LEFT KNEE - 1-2 VIEW  COMPARISON:  None.  FINDINGS: Advanced degenerative changes. Intra medullary rod partially imaged across a healed distal femoral fracture. No acute fracture, subluxation or dislocation. Small joint effusion.  IMPRESSION: Advanced degenerative changes. No acute findings.   Electronically Signed   By: Charlett Nose   On: 06/07/2013 11:17   Ct Head Wo Contrast  06/07/2013   CLINICAL DATA:  Altered mental status. Fall.  EXAM: CT HEAD WITHOUT CONTRAST  TECHNIQUE: Contiguous axial images were obtained from the base of the skull through the vertex without intravenous contrast.  COMPARISON:  05/15/2011  FINDINGS: There is atrophy and chronic small vessel disease changes. No acute intracranial abnormality. Specifically, no hemorrhage, hydrocephalus, mass lesion, acute infarction, or significant  intracranial injury. No acute calvarial abnormality. Visualized paranasal sinuses and mastoids clear. Orbital soft tissues unremarkable.  IMPRESSION: No acute intracranial abnormality.  Atrophy, chronic microvascular disease.   Electronically Signed   By: Charlett Nose   On: 06/07/2013 11:40   Mr Brain Wo Contrast  06/08/2013   CLINICAL DATA:  72 year old female with altered mental status, confusion. Recent hospitalization for diabetic ketoacidosis.  EXAM: MRI HEAD WITHOUT CONTRAST  TECHNIQUE: Multiplanar, multisequence MR imaging was performed. No intravenous contrast was administered.  COMPARISON:  Head CTs 06/07/2013 and earlier.  FINDINGS: 8 mm focus of restricted diffusion in the medial right thalamus (series 4, image 14). Subtle associated T2 and FLAIR hyperintensity. No mass effect or associated hemorrhage.  Questionable superimposed punctate focus of restricted diffusion in the central pons. There is an nearby a chronic lacunar infarct in the right paracentral pons.  No other restricted diffusion. Major intracranial vascular flow voids are preserved. No midline shift, mass effect, evidence of mass lesion, ventriculomegaly, extra-axial collection or acute intracranial hemorrhage. Cervicomedullary junction and pituitary are within normal limits. Negative visualized cervical spine. There is evidence of a small chronic lacunar infarct in the posterior right external capsule. Aside from this in the above described in the pons there is only mild for age patchy nonspecific cerebral white matter T2 and FLAIR hyperintensity.  Postoperative changes to the globes. Mild right mastoid effusion. Negative nasopharynx. Visualized bone marrow signal is within normal limits. Negative scalp soft tissues.  IMPRESSION: 1. Small acute lacunar infarct in the medial right thalamus. No associated mass effect or hemorrhage.  2. Questionable additional punctate acute lacunar infarct in the central pons. This is near a chronic right  paracentral pontine lacunar infarct.  3.  Otherwise mild for age signal changes in the brain.  Electronically Signed   By: Augusto Gamble   On: 06/08/2013 17:37   Portable Chest 1 View  06/07/2013   *RADIOLOGY REPORT*  Clinical Data: Altered mental status.  PORTABLE CHEST - 1 VIEW  Comparison: 06/04/2013.  Findings: Trachea is midline.  Heart size stable.  Lungs are clear. No pleural fluid.  IMPRESSION: No acute findings.   Original Report Authenticated By: Leanna Battles, M.D.     Assessment/Plan  generalied weakness- will have her work with therapy team, fall precautions  CVA- no focal weakness, to work with PT and OT for gait training and muscle strengthening. Fall precautions. Continue aggrenox for now. continue zocor and bp medications  Dm type 2- cbg better controlled. Was in 300-400 on arrival and now is 150-250. Continue metformin, levemir and premeal humalog, monitor cbg. Check cmp and monitor a1c periodically  Neuropathic pain- pain in both her legs are likely fro neuropathy. Will have her on gabapentin 100 mg bid for now and increase to 100 mg tid in 1 week if no improvement  HTn- bp well controlled. Continue exforge and coreg  Hyperlipidemia- continue zetia and omega 3 with zocor  Iron deficiency anemia- continue ferrous sulfate for now, monitor cbc  Hypothyroidism- continue levothyroxine and monitor for now. Check tsh  Depression- started on citalopram, monitor  Family/ staff Communication: reviewed care plan with patient and nursing staff   Labs/tests ordered- cbg, cbc, cmp, tsh

## 2013-06-24 ENCOUNTER — Other Ambulatory Visit: Payer: Self-pay

## 2013-06-24 LAB — CBC WITH DIFFERENTIAL/PLATELET
Basophil #: 0 10*3/uL (ref 0.0–0.1)
Basophil %: 0.3 %
Eosinophil #: 0 10*3/uL (ref 0.0–0.7)
Eosinophil %: 0 %
Lymphocyte %: 35.4 %
MCHC: 34.4 g/dL (ref 32.0–36.0)
MCV: 91 fL (ref 80–100)
Monocyte #: 0.8 x10 3/mm (ref 0.2–0.9)
Monocyte %: 14.1 %
Neutrophil #: 2.9 10*3/uL (ref 1.4–6.5)
RBC: 4.22 10*6/uL (ref 3.80–5.20)
RDW: 14.3 % (ref 11.5–14.5)
WBC: 5.9 10*3/uL (ref 3.6–11.0)

## 2013-06-24 LAB — COMPREHENSIVE METABOLIC PANEL
Albumin: 3.1 g/dL — ABNORMAL LOW (ref 3.4–5.0)
Alkaline Phosphatase: 72 U/L (ref 50–136)
BUN: 34 mg/dL — ABNORMAL HIGH (ref 7–18)
Bilirubin,Total: 0.5 mg/dL (ref 0.2–1.0)
Chloride: 110 mmol/L — ABNORMAL HIGH (ref 98–107)
Co2: 22 mmol/L (ref 21–32)
Creatinine: 1.34 mg/dL — ABNORMAL HIGH (ref 0.60–1.30)
EGFR (African American): 46 — ABNORMAL LOW
Osmolality: 295 (ref 275–301)
Potassium: 3.8 mmol/L (ref 3.5–5.1)
SGOT(AST): 26 U/L (ref 15–37)
SGPT (ALT): 25 U/L (ref 12–78)
Sodium: 141 mmol/L (ref 136–145)
Total Protein: 6.5 g/dL (ref 6.4–8.2)

## 2013-06-24 LAB — TSH: Thyroid Stimulating Horm: <0.01 u[IU]/mL — ABNORMAL LOW

## 2013-06-29 ENCOUNTER — Non-Acute Institutional Stay: Payer: Medicare Other | Admitting: Nurse Practitioner

## 2013-06-29 ENCOUNTER — Encounter: Payer: Self-pay | Admitting: Nurse Practitioner

## 2013-06-29 ENCOUNTER — Non-Acute Institutional Stay (SKILLED_NURSING_FACILITY): Payer: Medicare Other | Admitting: Nurse Practitioner

## 2013-06-29 DIAGNOSIS — IMO0002 Reserved for concepts with insufficient information to code with codable children: Secondary | ICD-10-CM

## 2013-06-29 DIAGNOSIS — R5381 Other malaise: Secondary | ICD-10-CM

## 2013-06-29 DIAGNOSIS — R531 Weakness: Secondary | ICD-10-CM

## 2013-06-29 DIAGNOSIS — E1159 Type 2 diabetes mellitus with other circulatory complications: Secondary | ICD-10-CM

## 2013-06-29 DIAGNOSIS — E119 Type 2 diabetes mellitus without complications: Secondary | ICD-10-CM

## 2013-06-29 DIAGNOSIS — E870 Hyperosmolality and hypernatremia: Secondary | ICD-10-CM

## 2013-06-29 DIAGNOSIS — E43 Unspecified severe protein-calorie malnutrition: Secondary | ICD-10-CM

## 2013-06-29 DIAGNOSIS — D509 Iron deficiency anemia, unspecified: Secondary | ICD-10-CM

## 2013-06-30 DIAGNOSIS — R269 Unspecified abnormalities of gait and mobility: Secondary | ICD-10-CM

## 2013-06-30 DIAGNOSIS — E1165 Type 2 diabetes mellitus with hyperglycemia: Secondary | ICD-10-CM

## 2013-06-30 DIAGNOSIS — I69998 Other sequelae following unspecified cerebrovascular disease: Secondary | ICD-10-CM

## 2013-06-30 DIAGNOSIS — M6281 Muscle weakness (generalized): Secondary | ICD-10-CM

## 2013-07-01 ENCOUNTER — Inpatient Hospital Stay (HOSPITAL_COMMUNITY): Payer: Medicare Other

## 2013-07-01 ENCOUNTER — Inpatient Hospital Stay (HOSPITAL_COMMUNITY)
Admission: EM | Admit: 2013-07-01 | Discharge: 2013-07-22 | DRG: 070 | Disposition: A | Discharge status: Skilled Nursing Facility | Payer: Medicare Other | Attending: Internal Medicine | Admitting: Internal Medicine

## 2013-07-01 ENCOUNTER — Encounter (HOSPITAL_COMMUNITY): Payer: Self-pay | Admitting: Emergency Medicine

## 2013-07-01 DIAGNOSIS — Z79899 Other long term (current) drug therapy: Secondary | ICD-10-CM

## 2013-07-01 DIAGNOSIS — F32A Depression, unspecified: Secondary | ICD-10-CM

## 2013-07-01 DIAGNOSIS — E1110 Type 2 diabetes mellitus with ketoacidosis without coma: Secondary | ICD-10-CM

## 2013-07-01 DIAGNOSIS — H548 Legal blindness, as defined in USA: Secondary | ICD-10-CM | POA: Diagnosis present

## 2013-07-01 DIAGNOSIS — R569 Unspecified convulsions: Secondary | ICD-10-CM

## 2013-07-01 DIAGNOSIS — I959 Hypotension, unspecified: Secondary | ICD-10-CM | POA: Diagnosis not present

## 2013-07-01 DIAGNOSIS — Z794 Long term (current) use of insulin: Secondary | ICD-10-CM

## 2013-07-01 DIAGNOSIS — Z8679 Personal history of other diseases of the circulatory system: Secondary | ICD-10-CM

## 2013-07-01 DIAGNOSIS — I639 Cerebral infarction, unspecified: Secondary | ICD-10-CM

## 2013-07-01 DIAGNOSIS — Z515 Encounter for palliative care: Secondary | ICD-10-CM

## 2013-07-01 DIAGNOSIS — E119 Type 2 diabetes mellitus without complications: Secondary | ICD-10-CM

## 2013-07-01 DIAGNOSIS — G039 Meningitis, unspecified: Secondary | ICD-10-CM

## 2013-07-01 DIAGNOSIS — E43 Unspecified severe protein-calorie malnutrition: Secondary | ICD-10-CM

## 2013-07-01 DIAGNOSIS — R4182 Altered mental status, unspecified: Secondary | ICD-10-CM

## 2013-07-01 DIAGNOSIS — F0390 Unspecified dementia without behavioral disturbance: Secondary | ICD-10-CM | POA: Diagnosis present

## 2013-07-01 DIAGNOSIS — E785 Hyperlipidemia, unspecified: Secondary | ICD-10-CM

## 2013-07-01 DIAGNOSIS — E0590 Thyrotoxicosis, unspecified without thyrotoxic crisis or storm: Secondary | ICD-10-CM

## 2013-07-01 DIAGNOSIS — E039 Hypothyroidism, unspecified: Secondary | ICD-10-CM

## 2013-07-01 DIAGNOSIS — R197 Diarrhea, unspecified: Secondary | ICD-10-CM

## 2013-07-01 DIAGNOSIS — R627 Adult failure to thrive: Secondary | ICD-10-CM | POA: Diagnosis present

## 2013-07-01 DIAGNOSIS — IMO0002 Reserved for concepts with insufficient information to code with codable children: Secondary | ICD-10-CM

## 2013-07-01 DIAGNOSIS — E872 Acidosis, unspecified: Secondary | ICD-10-CM

## 2013-07-01 DIAGNOSIS — N19 Unspecified kidney failure: Secondary | ICD-10-CM

## 2013-07-01 DIAGNOSIS — N179 Acute kidney failure, unspecified: Secondary | ICD-10-CM

## 2013-07-01 DIAGNOSIS — R531 Weakness: Secondary | ICD-10-CM

## 2013-07-01 DIAGNOSIS — D6959 Other secondary thrombocytopenia: Secondary | ICD-10-CM | POA: Diagnosis not present

## 2013-07-01 DIAGNOSIS — D509 Iron deficiency anemia, unspecified: Secondary | ICD-10-CM

## 2013-07-01 DIAGNOSIS — G9340 Encephalopathy, unspecified: Principal | ICD-10-CM

## 2013-07-01 DIAGNOSIS — I10 Essential (primary) hypertension: Secondary | ICD-10-CM

## 2013-07-01 DIAGNOSIS — E162 Hypoglycemia, unspecified: Secondary | ICD-10-CM

## 2013-07-01 DIAGNOSIS — I6350 Cerebral infarction due to unspecified occlusion or stenosis of unspecified cerebral artery: Secondary | ICD-10-CM | POA: Diagnosis present

## 2013-07-01 DIAGNOSIS — E876 Hypokalemia: Secondary | ICD-10-CM | POA: Diagnosis not present

## 2013-07-01 DIAGNOSIS — Z87891 Personal history of nicotine dependence: Secondary | ICD-10-CM

## 2013-07-01 DIAGNOSIS — J9819 Other pulmonary collapse: Secondary | ICD-10-CM | POA: Diagnosis not present

## 2013-07-01 DIAGNOSIS — R413 Other amnesia: Secondary | ICD-10-CM

## 2013-07-01 DIAGNOSIS — E1139 Type 2 diabetes mellitus with other diabetic ophthalmic complication: Secondary | ICD-10-CM

## 2013-07-01 DIAGNOSIS — F3289 Other specified depressive episodes: Secondary | ICD-10-CM

## 2013-07-01 DIAGNOSIS — Z113 Encounter for screening for infections with a predominantly sexual mode of transmission: Secondary | ICD-10-CM

## 2013-07-01 DIAGNOSIS — E11319 Type 2 diabetes mellitus with unspecified diabetic retinopathy without macular edema: Secondary | ICD-10-CM | POA: Diagnosis present

## 2013-07-01 DIAGNOSIS — E7800 Pure hypercholesterolemia, unspecified: Secondary | ICD-10-CM | POA: Diagnosis present

## 2013-07-01 DIAGNOSIS — K219 Gastro-esophageal reflux disease without esophagitis: Secondary | ICD-10-CM | POA: Diagnosis present

## 2013-07-01 DIAGNOSIS — E860 Dehydration: Secondary | ICD-10-CM

## 2013-07-01 DIAGNOSIS — Z66 Do not resuscitate: Secondary | ICD-10-CM | POA: Diagnosis present

## 2013-07-01 DIAGNOSIS — F329 Major depressive disorder, single episode, unspecified: Secondary | ICD-10-CM

## 2013-07-01 DIAGNOSIS — E870 Hyperosmolality and hypernatremia: Secondary | ICD-10-CM

## 2013-07-01 DIAGNOSIS — D649 Anemia, unspecified: Secondary | ICD-10-CM | POA: Diagnosis not present

## 2013-07-01 DIAGNOSIS — Z8673 Personal history of transient ischemic attack (TIA), and cerebral infarction without residual deficits: Secondary | ICD-10-CM

## 2013-07-01 DIAGNOSIS — R1310 Dysphagia, unspecified: Secondary | ICD-10-CM | POA: Diagnosis present

## 2013-07-01 DIAGNOSIS — Z8544 Personal history of malignant neoplasm of other female genital organs: Secondary | ICD-10-CM

## 2013-07-01 DIAGNOSIS — F411 Generalized anxiety disorder: Secondary | ICD-10-CM

## 2013-07-01 DIAGNOSIS — E1165 Type 2 diabetes mellitus with hyperglycemia: Secondary | ICD-10-CM | POA: Diagnosis present

## 2013-07-01 DIAGNOSIS — Z7401 Bed confinement status: Secondary | ICD-10-CM

## 2013-07-01 DIAGNOSIS — N390 Urinary tract infection, site not specified: Secondary | ICD-10-CM

## 2013-07-01 DIAGNOSIS — E871 Hypo-osmolality and hyponatremia: Secondary | ICD-10-CM

## 2013-07-01 DIAGNOSIS — A0472 Enterocolitis due to Clostridium difficile, not specified as recurrent: Secondary | ICD-10-CM

## 2013-07-01 DIAGNOSIS — D696 Thrombocytopenia, unspecified: Secondary | ICD-10-CM

## 2013-07-01 LAB — URINE MICROSCOPIC-ADD ON

## 2013-07-01 LAB — COMPREHENSIVE METABOLIC PANEL
ALT: 12 U/L (ref 0–35)
AST: 14 U/L (ref 0–37)
Alkaline Phosphatase: 66 U/L (ref 39–117)
CO2: 15 mEq/L — ABNORMAL LOW (ref 19–32)
Calcium: 9.5 mg/dL (ref 8.4–10.5)
Chloride: 95 mEq/L — ABNORMAL LOW (ref 96–112)
GFR calc Af Amer: 5 mL/min — ABNORMAL LOW (ref >90)
GFR calc non Af Amer: 5 mL/min — ABNORMAL LOW (ref >90)
Glucose, Bld: 192 mg/dL — ABNORMAL HIGH (ref 70–99)
Potassium: 4.6 mEq/L (ref 3.5–5.1)
Sodium: 127 mEq/L — ABNORMAL LOW (ref 135–145)
Total Bilirubin: 0.4 mg/dL (ref 0.3–1.2)

## 2013-07-01 LAB — CK: Total CK: 38 U/L (ref 7–177)

## 2013-07-01 LAB — URINALYSIS, ROUTINE W REFLEX MICROSCOPIC
Bilirubin Urine: NEGATIVE
Glucose, UA: NEGATIVE mg/dL
Ketones, ur: NEGATIVE mg/dL
Nitrite: NEGATIVE
Protein, ur: >300 mg/dL — AB
Specific Gravity, Urine: 1.019 (ref 1.005–1.030)
Urobilinogen, UA: 0.2 mg/dL (ref 0.0–1.0)
pH: 6 (ref 5.0–8.0)

## 2013-07-01 LAB — GLUCOSE, CAPILLARY: Glucose-Capillary: 92 mg/dL (ref 70–99)

## 2013-07-01 LAB — CBC WITH DIFFERENTIAL/PLATELET
Basophils Absolute: 0 10*3/uL (ref 0.0–0.1)
Basophils Relative: 1 % (ref 0–1)
Eosinophils Absolute: 0 K/uL (ref 0.0–0.7)
Eosinophils Relative: 0 % (ref 0–5)
HCT: 35.3 % — ABNORMAL LOW (ref 36.0–46.0)
Hemoglobin: 12.4 g/dL (ref 12.0–15.0)
Lymphocytes Relative: 21 % (ref 12–46)
Lymphs Abs: 1.5 10*3/uL (ref 0.7–4.0)
MCH: 31.1 pg (ref 26.0–34.0)
MCHC: 35.1 g/dL (ref 30.0–36.0)
MCV: 88.5 fL (ref 78.0–100.0)
Monocytes Absolute: 0.9 K/uL (ref 0.1–1.0)
Monocytes Relative: 13 % — ABNORMAL HIGH (ref 3–12)
Neutro Abs: 4.7 10*3/uL (ref 1.7–7.7)
Neutrophils Relative %: 66 % (ref 43–77)
Platelets: 167 10*3/uL (ref 150–400)
RBC: 3.99 MIL/uL (ref 3.87–5.11)
RDW: 14.5 % (ref 11.5–15.5)
WBC: 7.1 10*3/uL (ref 4.0–10.5)

## 2013-07-01 LAB — LACTIC ACID, PLASMA: Lactic Acid, Venous: 1.4 mmol/L (ref 0.5–2.2)

## 2013-07-01 LAB — COMPREHENSIVE METABOLIC PANEL WITH GFR
Albumin: 3.2 g/dL — ABNORMAL LOW (ref 3.5–5.2)
BUN: 87 mg/dL — ABNORMAL HIGH (ref 6–23)
Creatinine, Ser: 7.83 mg/dL — ABNORMAL HIGH (ref 0.50–1.10)
Total Protein: 6.5 g/dL (ref 6.0–8.3)

## 2013-07-01 LAB — CREATININE, URINE, RANDOM: Creatinine, Urine: 60.4 mg/dL

## 2013-07-01 LAB — BASIC METABOLIC PANEL
BUN: 86 mg/dL — ABNORMAL HIGH (ref 6–23)
CO2: 17 mEq/L — ABNORMAL LOW (ref 19–32)
GFR calc non Af Amer: 4 mL/min — ABNORMAL LOW (ref >90)
Glucose, Bld: 116 mg/dL — ABNORMAL HIGH (ref 70–99)
Potassium: 4 mEq/L (ref 3.5–5.1)
Sodium: 132 mEq/L — ABNORMAL LOW (ref 135–145)

## 2013-07-01 LAB — SODIUM, URINE, RANDOM: Sodium, Ur: 123 mEq/L

## 2013-07-01 LAB — TROPONIN I: Troponin I: <0.3 ng/mL (ref <0.30)

## 2013-07-01 MED ORDER — ACETAMINOPHEN 325 MG PO TABS: 650.0000 mg | ORAL_TABLET | Freq: Four times a day (QID) | ORAL | Status: DC | PRN

## 2013-07-01 MED ORDER — SODIUM CHLORIDE 0.9 % IV SOLN
INTRAVENOUS | Status: DC
  Administered 2013-07-01: 15:00:00 150 mL/h via INTRAVENOUS
  Administered 2013-07-01 – 2013-07-02 (×2): via INTRAVENOUS

## 2013-07-01 MED ORDER — EZETIMIBE 10 MG PO TABS
10.0000 mg | ORAL_TABLET | Freq: Every day | ORAL | Status: DC
  Administered 2013-07-01 – 2013-07-05 (×5): 10 mg via ORAL
  Filled 2013-07-01 (×6): qty 1

## 2013-07-01 MED ORDER — LEVOTHYROXINE SODIUM 75 MCG PO TABS
75.0000 ug | ORAL_TABLET | Freq: Every day | ORAL | Status: DC
  Administered 2013-07-01 – 2013-07-04 (×4): 75 ug via ORAL
  Filled 2013-07-01 (×8): qty 1

## 2013-07-01 MED ORDER — ACETAMINOPHEN 650 MG RE SUPP
650.0000 mg | Freq: Four times a day (QID) | RECTAL | Status: DC | PRN
  Administered 2013-07-06: 650 mg via RECTAL
  Filled 2013-07-01: qty 1

## 2013-07-01 MED ORDER — ONDANSETRON HCL 4 MG PO TABS: 4.0000 mg | ORAL_TABLET | Freq: Four times a day (QID) | ORAL | Status: DC | PRN

## 2013-07-01 MED ORDER — SIMVASTATIN 20 MG PO TABS
20.0000 mg | ORAL_TABLET | Freq: Every evening | ORAL | Status: DC
  Administered 2013-07-01 – 2013-07-05 (×5): 20 mg via ORAL
  Filled 2013-07-01 (×8): qty 1

## 2013-07-01 MED ORDER — ALBUTEROL SULFATE (5 MG/ML) 0.5% IN NEBU: 2.5000 mg | INHALATION_SOLUTION | RESPIRATORY_TRACT | Status: DC | PRN

## 2013-07-01 MED ORDER — ENOXAPARIN SODIUM 30 MG/0.3ML ~~LOC~~ SOLN
30.0000 mg | SUBCUTANEOUS | Status: DC
  Administered 2013-07-01 – 2013-07-05 (×5): 30 mg via SUBCUTANEOUS
  Filled 2013-07-01 (×6): qty 0.3

## 2013-07-01 MED ORDER — ASPIRIN-DIPYRIDAMOLE ER 25-200 MG PO CP12
1.0000 | ORAL_CAPSULE | Freq: Two times a day (BID) | ORAL | Status: DC
  Administered 2013-07-01 – 2013-07-05 (×8): 1 via ORAL
  Filled 2013-07-01 (×11): qty 1

## 2013-07-01 MED ORDER — FAMOTIDINE 20 MG PO TABS
20.0000 mg | ORAL_TABLET | Freq: Two times a day (BID) | ORAL | Status: DC
  Administered 2013-07-01 – 2013-07-03 (×3): 20 mg via ORAL
  Filled 2013-07-01 (×5): qty 1

## 2013-07-01 MED ORDER — INSULIN ASPART 100 UNIT/ML ~~LOC~~ SOLN
0.0000 [IU] | Freq: Three times a day (TID) | SUBCUTANEOUS | Status: DC
  Administered 2013-07-01 – 2013-07-02 (×2): 1 [IU] via SUBCUTANEOUS
  Administered 2013-07-03: 5 [IU] via SUBCUTANEOUS
  Administered 2013-07-03: 17:00:00 3 [IU] via SUBCUTANEOUS
  Administered 2013-07-03: 7 [IU] via SUBCUTANEOUS
  Administered 2013-07-04: 17:00:00 5 [IU] via SUBCUTANEOUS
  Administered 2013-07-04: 14:00:00 9 [IU] via SUBCUTANEOUS
  Administered 2013-07-05: 5 [IU] via SUBCUTANEOUS
  Administered 2013-07-05: 7 [IU] via SUBCUTANEOUS

## 2013-07-01 MED ORDER — INSULIN DETEMIR 100 UNIT/ML ~~LOC~~ SOLN
45.0000 [IU] | Freq: Two times a day (BID) | SUBCUTANEOUS | Status: DC
  Administered 2013-07-01: 45 [IU] via SUBCUTANEOUS
  Filled 2013-07-01 (×2): qty 0.45

## 2013-07-01 MED ORDER — CARVEDILOL 12.5 MG PO TABS
12.5000 mg | ORAL_TABLET | Freq: Two times a day (BID) | ORAL | Status: DC
  Administered 2013-07-01 – 2013-07-02 (×2): 12.5 mg via ORAL
  Filled 2013-07-01 (×5): qty 1

## 2013-07-01 MED ORDER — ADULT MULTIVITAMIN W/MINERALS CH
1.0000 | ORAL_TABLET | Freq: Every day | ORAL | Status: DC
  Administered 2013-07-01 – 2013-07-03 (×3): 1 via ORAL
  Filled 2013-07-01 (×6): qty 1

## 2013-07-01 MED ORDER — LIOTHYRONINE SODIUM 25 MCG PO TABS
25.0000 ug | ORAL_TABLET | Freq: Two times a day (BID) | ORAL | Status: DC
  Administered 2013-07-01 – 2013-07-09 (×8): 25 ug via ORAL
  Filled 2013-07-01 (×19): qty 1

## 2013-07-01 MED ORDER — LATANOPROST 0.005 % OP SOLN
1.0000 [drp] | Freq: Every day | OPHTHALMIC | Status: DC
  Administered 2013-07-01 – 2013-07-21 (×21): 1 [drp] via OPHTHALMIC
  Filled 2013-07-01 (×4): qty 2.5

## 2013-07-01 MED ORDER — ONDANSETRON HCL 4 MG/2ML IJ SOLN
4.0000 mg | Freq: Four times a day (QID) | INTRAMUSCULAR | Status: DC | PRN
  Administered 2013-07-02 – 2013-07-05 (×8): 4 mg via INTRAVENOUS
  Filled 2013-07-01 (×10): qty 2

## 2013-07-01 MED ORDER — SODIUM CHLORIDE 0.9 % IJ SOLN
3.0000 mL | Freq: Two times a day (BID) | INTRAMUSCULAR | Status: DC
  Administered 2013-07-04 – 2013-07-17 (×10): 3 mL via INTRAVENOUS

## 2013-07-01 MED ORDER — DEXTROSE 5 % IV SOLN
1.0000 g | INTRAVENOUS | Status: DC
  Administered 2013-07-01 – 2013-07-04 (×4): 1 g via INTRAVENOUS
  Filled 2013-07-01 (×5): qty 10

## 2013-07-01 MED ORDER — INSULIN ASPART 100 UNIT/ML ~~LOC~~ SOLN
0.0000 [IU] | Freq: Every day | SUBCUTANEOUS | Status: DC
  Administered 2013-07-04: 4 [IU] via SUBCUTANEOUS
  Administered 2013-07-04: 3 [IU] via SUBCUTANEOUS

## 2013-07-01 MED ORDER — SODIUM CHLORIDE 0.9 % IV BOLUS (SEPSIS)
1000.0000 mL | Freq: Once | INTRAVENOUS | Status: AC
  Administered 2013-07-01: 1000 mL via INTRAVENOUS

## 2013-07-01 NOTE — ED Notes (Signed)
Pt complains of generalized weakness and lethargy. Pt was hospitalized in August for DKA. Daughter states mother refused to physical therapy in rehab. Was d/c from rehab Thursday. Daughter states pt refuses to drink at home and cannot walk to restroom with daughter's help. Had some diarrhea yesterday, but had solid stool today. Daughter reports mother is urinating less as well. Denies n/v. CBG 127 at home.

## 2013-07-01 NOTE — H&P (Signed)
[facility] History and Physical  Regina [name] QMV:[id number] DOB: [birth year] DOA: 07/01/2013  Referring physician: Dr. Quita Skye, EDP PCP: Michiel Sites, MD  Outpatient Specialists:  1. None  Chief Complaint: Generalized weakness  HPI: Regina [name] is a 72 y.o. female has had a complicated medical course over the last 1 month. Patient is unable to provide history due to weakness and? Dementia. History is provided by patient's daughter Ms. Regina [name] who is at the bedside. 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 She was recently admitted twice at [facility]-06/04/13-06/06/13 (DKA) & 06/07/13-06/09/13 (metabolic encephalopathy, uncontrolled DM and CVA). Following the last discharge, patient was sent to rehabilitation where she was for approximately 3 weeks. Patient has had a sustained rapid decline at SNF. 2 days after going to SNF, she developed profuse diarrhea of 9 days duration, extremely poor appetite, lethargy, laying in bed and unwilling to participate in therapies, decreased urine output and progressive generalized weakness. The daughter believes that she was not adequately cared for at the SNF. Due to cost issues, patient returned home 2 days ago. Patient continues to have intermittent diarrhea. She had vomiting which has since stopped. She continues to be extremely weak and is mostly bed bound and can barely stand up with assistance. She has continued her medications at home. Her highest CBG at home was 256 mg per DL. She has lost approximately 25 pounds weight over the last month. Physical therapy came by to see patient and advised family that she was too weak and to be taken to the hospital. In the ED, initially mildly hypotensive which improved after IV fluids, sodium 127, bicarbonate 15, BUN 87, creatinine 7.83, glucose 192, UA suggestive of UTI and Foley has small amount of  milky urine with sediments. Hospitalist admission was requested.   Review of Systems: All systems were reviewed with daughter and apart from history of present illness, are negative    Past Medical History  Diagnosis Date  . Hypertension   . Hypercholesteremia   . TIA (transient ischemic attack) 1990's    "mini strokes" (06/07/2013)  . Legally blind     "both eyes; some vision in the right' (06/07/2013)  . Heart murmur   . Hypothyroidism   . Type II diabetes mellitus   . Anemia   . GERD (gastroesophageal reflux disease)   . Arthritis     "left leg" (06/07/2013)  . Depression   . Vulva cancer   . Altered mental status     "the first time I noticed any problem was 2 d ago" (06/07/2013)   Past Surgical History  Procedure Laterality Date  . Femur fracture surgery Left 1999  . Cesarean section  1968  . Tubal ligation  1970's  . Vulva surgery  1990's    "cut a big chunk out for cancer" (06/07/2013)   Social History:  reports that she quit smoking about 20 years ago. Her smoking use included Cigarettes. She has a 120 pack-year smoking history. She has never used smokeless tobacco. She reports that she does not drink alcohol or use illicit drugs. Married but separated. Lives alone and was independent until a month ago. Currently bedbound.  Allergies  Allergen Reactions  . Amoxicillin     REACTION: rash  . Ciprofloxacin     REACTION: hives  . Codeine     REACTION: nervous, shaky  . Duloxetine     REACTION: reaction not  known  . Hydrochlorothiazide W-Triamterene     REACTION: hyponatremia  . Ticlopidine Hcl     REACTION: bleeding    Family History  Problem Relation Age of Onset  . Hypertension Mother   . Hyperlipidemia Mother   . Hyperlipidemia Father   . Hypertension Father   . Diabetes Father     Prior to Admission medications   Medication Sig Start Date End Date Taking? Authorizing Provider  amLODipine-valsartan (EXFORGE) 10-320 MG per tablet Take 1 tablet by mouth  daily. 06/06/13  Yes Nishant Dhungel, MD  Calcium Carbonate-Vit D-Min (CALCIUM 1200 PO) Take 1 tablet by mouth daily.   Yes Historical Provider, MD  carvedilol (COREG) 12.5 MG tablet Take 1 tablet (12.5 mg total) by mouth 2 (two) times daily with a meal. 06/06/13  Yes Nishant Dhungel, MD  dipyridamole-aspirin (AGGRENOX) 200-25 MG per 12 hr capsule Take 1 capsule by mouth 2 (two) times daily. 06/09/13  Yes Shanker Levora Dredge, MD  ezetimibe (ZETIA) 10 MG tablet Take 10 mg by mouth daily.   Yes Historical Provider, MD  ferrous fumarate (HEMOCYTE - 106 MG FE) 325 (106 FE) MG TABS tablet Take 1 tablet by mouth daily.   Yes Historical Provider, MD  insulin aspart (NOVOLOG) 100 UNIT/ML injection 0-9 Units, Subcutaneous, 3 times daily with meals CBG < 70: implement hypoglycemia protocol CBG 70 - 120: 0 units CBG 121 - 150: 1 unit CBG 151 - 200: 2 units CBG 201 - 250: 3 units CBG 251 - 300: 5 units CBG 301 - 350: 7 units CBG 351 - 400: 9 units CBG > 400: call MD 06/09/13  Yes Shanker Levora Dredge, MD  insulin detemir (LEVEMIR) 100 UNIT/ML injection Inject 0.45 mLs (45 Units total) into the skin 2 (two) times daily. 06/06/13  Yes Nishant Dhungel, MD  levothyroxine (SYNTHROID, LEVOTHROID) 75 MCG tablet Take 1 tablet (75 mcg total) by mouth daily before breakfast. 06/07/13  Yes Nishant Dhungel, MD  liothyronine (CYTOMEL) 25 MCG tablet Take 25 mcg by mouth 2 (two) times daily.   Yes Historical Provider, MD  metFORMIN (GLUCOPHAGE) 500 MG tablet Take 500 mg by mouth 2 (two) times daily.    Yes Historical Provider, MD  Multiple Vitamin (MULTIVITAMIN WITH MINERALS) TABS tablet Take 1 tablet by mouth daily.   Yes Historical Provider, MD  Omega-3 Fatty Acids (FISH OIL) 1000 MG CAPS Take 2,000 mg by mouth daily.   Yes Historical Provider, MD  ranitidine (ZANTAC) 75 MG tablet Take 75 mg by mouth 2 (two) times daily.   Yes Historical Provider, MD  simvastatin (ZOCOR) 20 MG tablet Take 1 tablet (20 mg total) by mouth every  evening. 06/06/13  Yes Nishant Dhungel, MD  Travoprost, BAK Free, (TRAVATAN) 0.004 % SOLN ophthalmic solution Place 1 drop into both eyes at bedtime.   Yes Historical Provider, MD   Physical Exam: Filed Vitals:   07/01/13 1145 07/01/13 1146 07/01/13 1302 07/01/13 1345  BP: 110/95 110/95 126/60   Pulse: 54 66 66 68  Temp:      Resp: 18  16 16   SpO2: 87%  97% 96%     General exam: Moderately built and morbidly obese female patient, in looking, lying comfortably supine on the gurney in no obvious distress.  Head, eyes and ENT: Nontraumatic and normocephalic. Pupils equally reacting to light and accommodation. Oral mucosa dry.  Neck: Supple. No JVD, carotid bruit or thyromegaly.  Lymphatics: No lymphadenopathy.  Respiratory system: Poor inspiratory effort but seems clear to auscultation.  No increased work of breathing.  Cardiovascular system: S1 and S2 heard, RRR. No JVD, murmurs, gallops, clicks or pedal edema.  Gastrointestinal system: Abdomen is obese, pannus +, soft and nontender. Normal bowel sounds heard. No organomegaly or masses appreciated. Foley catheter shows minimal milky urine with sediments.  Central nervous system: Alert and oriented to person and partly to place. Follows some commands appropriately. No focal neurological deficits.  Extremities: Symmetric 4 x 5 power. Peripheral pulses symmetrically felt.  Skin: No rashes or acute findings.  Musculoskeletal system: Negative exam.  Psychiatry:  Flat affect.   Labs on Admission:  Basic Metabolic Panel:  Recent Labs Lab 07/01/13 1134  NA 127*  K 4.6  CL 95*  CO2 15*  GLUCOSE 192*  BUN 87*  CREATININE 7.83*  CALCIUM 9.5   Liver Function Tests:  Recent Labs Lab 07/01/13 1134  AST 14  ALT 12  ALKPHOS 66  BILITOT 0.4  PROT 6.5  ALBUMIN 3.2*   No results found for this basename: LIPASE, AMYLASE,  in the last 168 hours No results found for this basename: AMMONIA,  in the last 168  hours CBC:  Recent Labs Lab 07/01/13 1134  WBC 7.1  NEUTROABS 4.7  HGB 12.4  HCT 35.3*  MCV 88.5  PLT 167   Cardiac Enzymes:  Recent Labs Lab 07/01/13 1134  CKTOTAL 38  TROPONINI <0.30    BNP (last 3 results) No results found for this basename: PROBNP,  in the last 8760 hours CBG: No results found for this basename: GLUCAP,  in the last 168 hours  Radiological Exams on Admission: No results found.  EKG: Independently reviewed. Sinus rhythm, Q waves in leads 3 and aVF. No acute findings.  Assessment/Plan Principal Problem:   Acute renal failure Active Problems:   HYPOTHYROIDISM   DIABETES MELLITUS, TYPE II   HYPERLIPIDEMIA   UTI (urinary tract infection)   HTN (hypertension)   Anxiety and depression   CVA (cerebral infarction)   Weakness generalized   Dehydration   Failure to thrive   Diarrhea   Hyponatremia   Metabolic acidosis   Acute renal failure, oliguric/Anion gap metabolic acidosis - Multifactorial: Dehydration, ARB & possible ATN - Followup urine sodium, creatinine - Check renal ultrasound - Continue Foley catheter that was placed in the ED - Hold ARB - Aggressive IV fluid hydration, strict input output monitoring and follow BMP closely - No indicators for urgent dialysis at this time. - If acidosis does not improve, may consider changing IV fluids to sterile water with bicarbonate drip. - If does not improve, consider nephrology consultation - Check serum Lactate  Dehydration with hyponatremia - Multifactorial: Intermittent diarrhea and vomiting, poor oral intake and hyperglycemia - IV fluids and monitor BMPs.  UTI - Continue IV Rocephin pending urine culture results.  Intermittent diarrhea - Check KUB to rule out constipation with overflow incontinence - Check C. difficile PCR and placed on contact isolation until ruled out  Uncontrolled type II DM - Hold Metformin - Continue Levemir and NovoLog SSI.  Hypertension - Patient  initially came in mildly hypotensive, possibly from dehydration. - Improved. Hold Exforge - Continue Carvedilol.  Hypothyroidism - Continue prior home medications.  History of CVA - Continue Aggrenox.  History of anxiety and depression  Code Status: Full  Family Communication: Discussed with patient's daughter Ms. Regina [name] at bedside.  Disposition Plan: To be determined.   Time spent: 70 minutes  [facility] Pager [phone]  If 7PM-7AM, please contact night-coverage [email or web address]  Password [email or web address] 07/01/2013, 2:29 PM

## 2013-07-01 NOTE — Progress Notes (Signed)
75 cc output since 1400 admission, Dr.Hongalgi notfied and bolus of 500cc started.

## 2013-07-01 NOTE — ED Notes (Signed)
Bed: WA20 Expected date: 07/01/13 Expected time: 10:33 AM Means of arrival: Ambulance Comments: Gen weakness

## 2013-07-01 NOTE — ED Notes (Signed)
MD at bedside. 

## 2013-07-01 NOTE — ED Provider Notes (Addendum)
CSN: [id number]     Arrival date & time 07/01/13  1031 History   First MD Initiated Contact with Patient 07/01/13 1040     Chief Complaint  Patient presents with  . Fatigue   (Consider location/radiation/quality/duration/timing/severity/associated sxs/prior Treatment) HPI Comments: Pt was admitted for DKA about 1 month ago, was released, had to be brought back for generalized weakness, hallucinations, was admitted again, but only for about 1 day, placed in nursing home, [facility] where pt was allowed to have diarrhea for 2 weeks and also laid in bed constantly without proper PT per daughter . After 20 days, due to cost, pt had to come home with family but because they work, have not been able to manage well, have even paid a friend/neighbor to help pt at home.  Pt has had diarrhea on bed because she had no energy and/or motivation to go to the bathroom.  No fevers, chills, no N/V, CP, SOB, coughing.   Pt's medications were somewhat adjusted with last admission.  Pt reports no appetite.  Daughter reports about 25 lb weight loss in past 1 month.  PCP is Dr. Juleen China.  Pt has no home health, again due to cost, but PTist at home  called and told daughter that pt was too weak and thought she should be taken to the hospital.    Patient is a 72 y.o. female presenting with weakness. The history is provided by the patient, medical records and a relative.  Weakness This is a recurrent problem. The problem occurs constantly. The problem has been gradually worsening. Pertinent negatives include no chest pain, no abdominal pain, no headaches and no shortness of breath. The symptoms are aggravated by standing and walking. Nothing relieves the symptoms.    Past Medical History  Diagnosis Date  . Hypertension   . Hypercholesteremia   . TIA (transient ischemic attack) 1990's    "mini strokes" (06/07/2013)  . Legally blind     "both eyes; some vision in the right' (06/07/2013)  . Heart murmur   . Hypothyroidism    . Type II diabetes mellitus   . Anemia   . GERD (gastroesophageal reflux disease)   . Arthritis     "left leg" (06/07/2013)  . Depression   . Vulva cancer   . Altered mental status     "the first time I noticed any problem was 2 d ago" (06/07/2013)   Past Surgical History  Procedure Laterality Date  . Femur fracture surgery Left 1999  . Cesarean section  1968  . Tubal ligation  1970's  . Vulva surgery  1990's    "cut a big chunk out for cancer" (06/07/2013)   Family History  Problem Relation Age of Onset  . Hypertension Mother   . Hyperlipidemia Mother   . Hyperlipidemia Father   . Hypertension Father   . Diabetes Father    History  Substance Use Topics  . Smoking status: Former Smoker -- 4.00 packs/day for 30 years    Types: Cigarettes    Quit date: 10/12/1992  . Smokeless tobacco: Never Used     Comment: 06/07/2013 "hasn't smoked since her stroke in the 1990's"  . Alcohol Use: No   OB History   Grav Para Term Preterm Abortions TAB SAB Ect Mult Living                 Review of Systems  Unable to perform ROS: Acuity of condition  Respiratory: Negative for shortness of breath.   Cardiovascular:  Negative for chest pain.  Gastrointestinal: Negative for abdominal pain.  Neurological: Positive for weakness. Negative for headaches.    Allergies  Amoxicillin; Ciprofloxacin; Codeine; Duloxetine; Hydrochlorothiazide w-triamterene; and Ticlopidine hcl  Home Medications   Current Outpatient Rx  Name  Route  Sig  Dispense  Refill  . amLODipine-valsartan (EXFORGE) 10-320 MG per tablet   Oral   Take 1 tablet by mouth daily.   30 tablet   0   . aspirin EC 81 MG tablet   Oral   Take 81 mg by mouth daily.         Marland Kitchen atenolol (TENORMIN) 100 MG tablet   Oral   Take 100 mg by mouth daily.         . Calcium Carbonate-Vit D-Min (CALCIUM 1200 PO)   Oral   Take 1 tablet by mouth daily.         . carvedilol (COREG) 12.5 MG tablet   Oral   Take 1 tablet (12.5 mg  total) by mouth 2 (two) times daily with a meal.   60 tablet   0   . dipyridamole-aspirin (AGGRENOX) 200-25 MG per 12 hr capsule   Oral   Take 1 capsule by mouth 2 (two) times daily.         Marland Kitchen ezetimibe (ZETIA) 10 MG tablet   Oral   Take 10 mg by mouth daily.         . ferrous fumarate (HEMOCYTE - 106 MG FE) 325 (106 FE) MG TABS tablet   Oral   Take 1 tablet by mouth daily.         . insulin aspart (NOVOLOG) 100 UNIT/ML injection      0-9 Units, Subcutaneous, 3 times daily with meals CBG < 70: implement hypoglycemia protocol CBG 70 - 120: 0 units CBG 121 - 150: 1 unit CBG 151 - 200: 2 units CBG 201 - 250: 3 units CBG 251 - 300: 5 units CBG 301 - 350: 7 units CBG 351 - 400: 9 units CBG > 400: call MD   1 vial   12   . insulin detemir (LEVEMIR) 100 UNIT/ML injection   Subcutaneous   Inject 0.45 mLs (45 Units total) into the skin 2 (two) times daily.   10 mL   3   . levothyroxine (SYNTHROID, LEVOTHROID) 75 MCG tablet   Oral   Take 1 tablet (75 mcg total) by mouth daily before breakfast.   30 tablet   0   . liothyronine (CYTOMEL) 25 MCG tablet   Oral   Take 25 mcg by mouth 2 (two) times daily.         . metFORMIN (GLUCOPHAGE) 500 MG tablet   Oral   Take 500 mg by mouth 2 (two) times daily.          . Multiple Vitamin (MULTIVITAMIN WITH MINERALS) TABS tablet   Oral   Take 1 tablet by mouth daily.         . Omega-3 Fatty Acids (FISH OIL) 1000 MG CAPS   Oral   Take 2,000 mg by mouth daily.         . ranitidine (ZANTAC) 75 MG tablet   Oral   Take 75 mg by mouth 2 (two) times daily.         . simvastatin (ZOCOR) 20 MG tablet   Oral   Take 1 tablet (20 mg total) by mouth every evening.   30 tablet   0   . Travoprost, BAK  Free, (TRAVATAN) 0.004 % SOLN ophthalmic solution   Both Eyes   Place 1 drop into both eyes at bedtime.          BP 126/60  Pulse 66  Temp(Src) 97.8 F (36.6 C)  Resp 16  SpO2 97% Physical Exam  Nursing note and  vitals reviewed. Constitutional: She appears well-developed and well-nourished. No distress.  HENT:  Head: Normocephalic and atraumatic.  Eyes: Conjunctivae and EOM are normal. No scleral icterus.  Cardiovascular: Normal rate, regular rhythm and intact distal pulses.   Murmur heard. Pulmonary/Chest: Effort normal. No respiratory distress. She has no wheezes.  Abdominal: Soft. She exhibits no distension. There is no tenderness. There is no rebound.  Musculoskeletal: She exhibits no edema and no tenderness.  Neurological: She is alert. She displays no atrophy and normal reflexes. No cranial nerve deficit. She exhibits normal muscle tone.  4/5 strength in 4 extremities, poor effort, symmetric however  Skin: Skin is warm and dry. She is not diaphoretic.  Psychiatric: She has a normal mood and affect. Her speech is delayed. She is slowed.  Pt admits to possibly being depressed    ED Course  Procedures (including critical care time) Labs Review Labs Reviewed  CBC WITH DIFFERENTIAL - Abnormal; Notable for the following:    HCT 35.3 (*)    Monocytes Relative 13 (*)    All other components within normal limits  COMPREHENSIVE METABOLIC PANEL - Abnormal; Notable for the following:    Sodium 127 (*)    Chloride 95 (*)    CO2 15 (*)    Glucose, Bld 192 (*)    BUN 87 (*)    Creatinine, Ser 7.83 (*)    Albumin 3.2 (*)    GFR calc non Af Amer 5 (*)    GFR calc Af Amer 5 (*)    All other components within normal limits  TROPONIN I  URINALYSIS, ROUTINE W REFLEX MICROSCOPIC  TSH  T4, FREE  CK   Imaging Review No results found.  ECG at time 11:35 shows SR at rate 63, LVH with repol abn, PVC's no longer seen comapred to ECG on 06/07/13.  RA sat is 96% and I interpret to be adequate  12:00 PM In checking orthostatic, required 2 people to get pt into standing position.  Sitting BP was more demonstrative of orthostatsis than standing.  Pt clearly couldn't stand under her own power . Pt had  no problems ambulating 1  Month ago when onset of hyperglycemia and DKA occurred per daughter.    1:16 PM CMP shows pt is acidotic and in renal failure with BUN of 87, Cr of 7.83.  Last month, renal function was normal . Will also check a CK total given pt has been weak, not walking much.  Will start IVF's, foley was ordered.  Will also order a renal U/S and discuss with hospitalist for admission.  K= is normal at 4.6.    MDM   1. Renal failure   2. Dehydration   3. Weakness      Pt with generalized weakness, confusion with associated hallucinations, visual and minor delusions at home, has remained hyperglycemic at home, limited assistance at home currently . Will check UA, labs, troponin, ECG for now.      Gavin Pound. Oletta Lamas, MD 07/01/13 1318  Gavin Pound. Aasha Dina, MD 07/01/13 1334

## 2013-07-02 DIAGNOSIS — F341 Dysthymic disorder: Secondary | ICD-10-CM

## 2013-07-02 DIAGNOSIS — E162 Hypoglycemia, unspecified: Secondary | ICD-10-CM

## 2013-07-02 DIAGNOSIS — E872 Acidosis: Secondary | ICD-10-CM

## 2013-07-02 DIAGNOSIS — E871 Hypo-osmolality and hyponatremia: Secondary | ICD-10-CM

## 2013-07-02 LAB — GLUCOSE, CAPILLARY
Glucose-Capillary: 109 mg/dL — ABNORMAL HIGH (ref 70–99)
Glucose-Capillary: 131 mg/dL — ABNORMAL HIGH (ref 70–99)
Glucose-Capillary: 120 mg/dL — ABNORMAL HIGH (ref 70–99)
Glucose-Capillary: 151 mg/dL — ABNORMAL HIGH (ref 70–99)

## 2013-07-02 LAB — BASIC METABOLIC PANEL
BUN: 84 mg/dL — ABNORMAL HIGH (ref 6–23)
CO2: 12 mEq/L — ABNORMAL LOW (ref 19–32)
Chloride: 103 mEq/L (ref 96–112)
Creatinine, Ser: 7.93 mg/dL — ABNORMAL HIGH (ref 0.50–1.10)
GFR calc non Af Amer: 5 mL/min — ABNORMAL LOW (ref >90)
Potassium: 4.2 mEq/L (ref 3.5–5.1)
Sodium: 131 mEq/L — ABNORMAL LOW (ref 135–145)

## 2013-07-02 LAB — CBC
HCT: 31.8 % — ABNORMAL LOW (ref 36.0–46.0)
Hemoglobin: 11.4 g/dL — ABNORMAL LOW (ref 12.0–15.0)
MCV: 88.6 fL (ref 78.0–100.0)
RBC: 3.59 MIL/uL — ABNORMAL LOW (ref 3.87–5.11)
RDW: 14.5 % (ref 11.5–15.5)
WBC: 7.5 10*3/uL (ref 4.0–10.5)

## 2013-07-02 LAB — T4, FREE: Free T4: 0.6 ng/dL — ABNORMAL LOW (ref 0.80–1.80)

## 2013-07-02 LAB — TSH: TSH: 0.032 u[IU]/mL — ABNORMAL LOW (ref 0.350–4.500)

## 2013-07-02 MED ORDER — DEXTROSE 50 % IV SOLN: 25.0000 mL | Freq: Once | INTRAVENOUS | Status: AC | PRN

## 2013-07-02 MED ORDER — CARVEDILOL 6.25 MG PO TABS
6.2500 mg | ORAL_TABLET | Freq: Two times a day (BID) | ORAL | Status: DC
  Administered 2013-07-02 – 2013-07-04 (×5): 6.25 mg via ORAL
  Filled 2013-07-02 (×8): qty 1

## 2013-07-02 MED ORDER — STERILE WATER FOR INJECTION IV SOLN
INTRAVENOUS | Status: DC
  Administered 2013-07-02 – 2013-07-05 (×8): via INTRAVENOUS
  Filled 2013-07-02 (×17): qty 850

## 2013-07-02 MED ORDER — DEXTROSE 50 % IV SOLN
INTRAVENOUS | Status: AC
  Administered 2013-07-02: 08:00:00
  Filled 2013-07-02: qty 50

## 2013-07-02 MED ORDER — DEXTROSE 50 % IV SOLN: 1.0000 | Freq: Once | INTRAVENOUS | Status: DC

## 2013-07-02 NOTE — Progress Notes (Signed)
Pt urine out put is only 50 cc cloudy, milky urine, no odor noted. IV fluids are at 150 cc/hr-- No leakage noted around the catheter. NP notified of finding.

## 2013-07-02 NOTE — Progress Notes (Signed)
Urine output for the 7p-7a shift totaled 60 cc or cloudy urine. Pt has generalized edema noted.  SRP, RN

## 2013-07-02 NOTE — Progress Notes (Signed)
[facility] PROGRESS NOTE  Sheilla Maris Seybold WUJ:[id number] DOB: [birth year] DOA: 07/01/2013 PCP: Michiel Sites, MD  Assessment/Plan: Acute renal failure, oliguric/with worsening metabolic acidosis  - Multifactorial: Dehydration, ARB, possible ATN  -I have changed IVF HCO3 drip and consulted renal as pt appears to be uremic and may require dialysis at least short term.  - renal ultrasound with no hydronephrosis, cysts noted - Continue Foley catheter, closely monitor UOP - Continue holding ARB and metformin  -I updated daughter at bedside Dehydration with hyponatremia  - Multifactorial: Intermittent diarrhea and vomiting, poor oral intake and hyperglycemia on admission  - continue IV fluids as above, and monitor BMPs.  UTI  - Continue IV Rocephin pending urine culture results.  Intermittent diarrhea  - KUB with unremarkable bowel gas pattern - Improving, await C. difficile PCR   Uncontrolled type II DM with hypoglycemia - Hold Metformin  - Will DC Levemir and continue NovoLog SSI with hypoglycemia protocol.  Hypertension  - Patient initially came in mildly hypotensive, possibly from dehydration.  - Improved. Continue Holding  Exforge  - Continue Carvedilol.  Hypothyroidism  - Continue prior home medications.  History of CVA  - Continue Aggrenox.  History of anxiety and depression   Code Status: full Family Communication: daughter at bedside Disposition Plan:    Consultants:  RENAL  Procedures:  NONE  Antibiotics:  Rocephin started 9/20  HPI/Subjective: Pt somnolent this am, and disoriented. Also vomited x1 this am  Objective: Filed Vitals:   07/02/13 0510  BP: 128/49  Pulse: 67  Temp: 98.2 F (36.8 C)  Resp: 20    Intake/Output Summary (Last 24 hours) at 07/02/13 1007 Last data filed at 07/02/13 0519  Gross per 24 hour  Intake 2527.5 ml  Output    137 ml  Net 2390.5 ml   Filed Weights   07/01/13 1440  Weight: 99.8 kg (220 lb 0.3 oz)     Exam:  General: somnolent oriented x 1 In NAD Cardiovascular: RRR, nl S1 s2 Respiratory: CTAB Abdomen: soft +BS NT/ND, no masses palpable Extremities: No cyanosis and no edema    Data Reviewed: Basic Metabolic Panel:  Recent Labs Lab 07/01/13 1134 07/01/13 2000 07/02/13 0545  NA 127* 132* 131*  K 4.6 4.0 4.2  CL 95* 100 103  CO2 15* 17* 12*  GLUCOSE 192* 116* 37*  BUN 87* 86* 84*  CREATININE 7.83* 8.17* 7.93*  CALCIUM 9.5 8.6 8.6   Liver Function Tests:  Recent Labs Lab 07/01/13 1134  AST 14  ALT 12  ALKPHOS 66  BILITOT 0.4  PROT 6.5  ALBUMIN 3.2*   No results found for this basename: LIPASE, AMYLASE,  in the last 168 hours No results found for this basename: AMMONIA,  in the last 168 hours CBC:  Recent Labs Lab 07/01/13 1134 07/02/13 0545  WBC 7.1 7.5  NEUTROABS 4.7  --   HGB 12.4 11.4*  HCT 35.3* 31.8*  MCV 88.5 88.6  PLT 167 200   Cardiac Enzymes:  Recent Labs Lab 07/01/13 1134  CKTOTAL 38  TROPONINI <0.30   BNP (last 3 results) No results found for this basename: PROBNP,  in the last 8760 hours CBG:  Recent Labs Lab 07/01/13 1807 07/01/13 2130 07/02/13 0738  GLUCAP 133* 92 109*    No results found for this or any previous visit (from the past 240 hour(s)).   Studies: US Renal  07/01/2013   CLINICAL DATA:  Acute renal failure  EXAM: RENAL/URINARY TRACT ULTRASOUND COMPLETE  COMPARISON:  None.  FINDINGS: Right Kidney  Length: Measures 12.8 cm in length. Echogenicity within normal limits. No mass or hydronephrosis visualized. Cyst within the inferior pole measures 1.6 x 1.4 x 1.5 cm.  Left Kidney  Length: Measures 12.1 cm. Echogenicity within normal limits. No mass or hydronephrosis visualized. Cyst within the upper pole measures 1.4 x 1.6 x 1.4 cm.  Bladder:  Collapsed around a Foley catheter.  IMPRESSION: 1. No hydronephrosis 2. Renal cysts.   Electronically Signed   By: Signa Kell M.D.   On: 07/01/2013 15:59   Dg Chest Port 1  View  07/01/2013   CLINICAL DATA:  Shortness of breath; hypertension  EXAM: PORTABLE CHEST - 1 VIEW  COMPARISON:  June 07, 2013  FINDINGS: There is no edema or consolidation. Heart size and pulmonary vascularity are normal. No adenopathy. There is degenerative change in both shoulders.  IMPRESSION: No edema or consolidation.   Electronically Signed   By: Bretta Bang   On: 07/01/2013 14:50   Dg Abd Portable 1v  07/01/2013   CLINICAL DATA:  Abdominal pain with nausea and diarrhea  EXAM: PORTABLE ABDOMEN - 1 VIEW  COMPARISON:  None.  FINDINGS: Bowel gas pattern is unremarkable. No obstruction or free air is seen on this supine examination. There is no abnormal calcification. There is arthropathy in the hip joints and lumbar spine.  Impression:  The bowel gas pattern is unremarkable.   Electronically Signed   By: Bretta Bang   On: 07/01/2013 14:48    Scheduled Meds: . carvedilol  12.5 mg Oral BID WC  . cefTRIAXone (ROCEPHIN)  IV  1 g Intravenous Q24H  . dextrose      . dipyridamole-aspirin  1 capsule Oral BID  . enoxaparin (LOVENOX) injection  30 mg Subcutaneous Q24H  . ezetimibe  10 mg Oral Daily  . famotidine  20 mg Oral BID  . insulin aspart  0-5 Units Subcutaneous QHS  . insulin aspart  0-9 Units Subcutaneous TID WC  . latanoprost  1 drop Both Eyes QHS  . levothyroxine  75 mcg Oral QAC breakfast  . liothyronine  25 mcg Oral BID  . multivitamin with minerals  1 tablet Oral Daily  . simvastatin  20 mg Oral QPM  . sodium chloride  3 mL Intravenous Q12H   Continuous Infusions: .  sodium bicarbonate 150 mEq in sterile water 1000 mL infusion 100 mL/hr at 07/02/13 6213    Principal Problem:   Acute renal failure Active Problems:   HYPOTHYROIDISM   DIABETES MELLITUS, TYPE II   HYPERLIPIDEMIA   UTI (urinary tract infection)   HTN (hypertension)   Anxiety and depression   CVA (cerebral infarction)   Weakness generalized   Dehydration   Failure to thrive   Diarrhea    Hyponatremia   Metabolic acidosis    Time spent: 35    Anila Bojarski C  [facility] Pager [phone]. If 7PM-7AM, please contact night-coverage at [email or web address], password [facility] 07/02/2013, 10:07 AM  LOS: 1 day

## 2013-07-02 NOTE — Consult Note (Signed)
Requesting Physician:  Dr. Donna Bernard  Reason for Consult:  Acute kidney injury HPI: The patient is a 72 y.o. 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  Was hospitalized twice in the past month for uncontrolled DM and encephalopathy.  After her last admission 8/27-8/29 she was discharged to [facility] for rehab/strengthening.  Renal function at that time was normal (creatinine 0.76 on 06/07/13)    Her daughter Shawna Orleans (who provides the history) reports that her mother started declining 3-4 days after going to the  SNF.  She had the onset of diffuse, watery diarrhea about 9 days ago, along with nausea, vomiting, poor po intake, loss of about 25 pounds and was lethargic, not able to participate in PT.  She continued to receive her medications which included metformin and exforge.    Was brought from SNF to daughter's home where she continued to have the same symptoms, and was brought to the ED by her daughter.  She was found to have acute renal failure with a creatinine of 7.8, metabolic acidosis with a bicarb of 15. Was stated in Hospitalist's note that she was mildly hypotensive.  Foley was placed yielding only a small amount of milky urine.  She has received IVF as NS(Bicarb drip was ordered but has not yet been hung)  Renal ultrasound was negative for hydro and kidneys were normal in size.  Based on creatinine trending from EPIC, renal function was normal 3 weeks ago.  It also appears that she has had at least 1 prior episode of acute renal failure back in 2012 during a hospitalization for DKA.  Creatinine, Ser  Date/Time Value Range Status  07/02/2013  5:45 AM 7.93* 0.50 - 1.10 mg/dL Final  1/61/0960  4:54 PM 8.17* 0.50 - 1.10 mg/dL Final  0/98/1191 47:82 AM 7.83* 0.50 - 1.10 mg/dL Final  9/56/2130  8:65 AM 0.69  0.50 - 1.10 mg/dL Final  7/84/6962  9:52 PM 0.62  0.50 - 1.10 mg/dL Final  8/41/3244  0:10 AM 0.76  0.50 - 1.10 mg/dL  Final  2/72/5366  4:40 AM 0.67  0.50 - 1.10 mg/dL Final  3/47/4259  5:63 PM 0.83  0.50 - 1.10 mg/dL Final  8/75/6433  2:95 PM 0.83  0.50 - 1.10 mg/dL Final  1/88/4166  0:63 PM 0.89  0.50 - 1.10 mg/dL Final  0/16/0109  3:23 PM 0.96  0.50 - 1.10 mg/dL Final  5/57/3220 25:42 AM 1.07  0.50 - 1.10 mg/dL Final  7/0/6237  6:28 AM <0.47* 0.50 - 1.10 mg/dL Final  12/11/5174  1:60 AM <0.47* 0.50 - 1.10 mg/dL Final     DELTA CHECK NOTED  05/17/2011  5:04 AM 0.80  0.50 - 1.10 mg/dL Final  04/13/7105  2:69 PM 1.07  0.50 - 1.10 mg/dL Final  01/17/5461  7:03 AM 1.12* 0.50 - 1.10 mg/dL Final  5/0/0938  1:82 AM 1.46* 0.50 - 1.10 mg/dL Final  06/21/3715  9:67 AM 1.52* 0.50 - 1.10 mg/dL Final  05/20/3809 17:51 PM 1.65* 0.50 - 1.10 mg/dL Final  0/11/5850  7:78 PM 1.81* 0.50 - 1.10 mg/dL Final  11/16/2351 61:44 PM 1.68* 0.50 - 1.10 mg/dL Final  12/10/5398  8:67 AM 1.51* 0.50 - 1.10 mg/dL Final     Past Medical History  Diagnosis Date  . Hypertension   . Hypercholesteremia   . TIA (transient ischemic attack) 1990's    "mini strokes" (06/07/2013)  . Legally blind     "both eyes;  some vision in the right' (06/07/2013)  . Heart murmur   . Hypothyroidism   . Type II diabetes mellitus   . Anemia   . GERD (gastroesophageal reflux disease)   . Arthritis     "left leg" (06/07/2013)  . Depression   . Vulva cancer   . Altered mental status     "the first time I noticed any problem was 2 d ago" (06/07/2013)     Past Surgical History  Procedure Laterality Date  . Femur fracture surgery Left 1999  . Cesarean section  1968  . Tubal ligation  1970's  . Vulva surgery  1990's    "cut a big chunk out for cancer" (06/07/2013)     Family History  Problem Relation Age of Onset  . Hypertension Mother   . Hyperlipidemia Mother   . Hyperlipidemia Father   . Hypertension Father   . Diabetes Father    Social History:  reports that she quit smoking about 20 years ago. Her smoking use included Cigarettes. She has a 120  pack-year smoking history. She has never used smokeless tobacco. She reports that she does not drink alcohol or use illicit drugs.  Allergies:  Allergies  Allergen Reactions  . Amoxicillin     REACTION: rash  . Ciprofloxacin     REACTION: hives  . Codeine     REACTION: nervous, shaky  . Duloxetine     REACTION: reaction not known  . Hydrochlorothiazide W-Triamterene     REACTION: hyponatremia  . Ticlopidine Hcl     REACTION: bleeding    Home medications: Prior to Admission medications   Medication Sig Start Date End Date Taking? Authorizing Provider  amLODipine-valsartan (EXFORGE) 10-320 MG per tablet Take 1 tablet by mouth daily. 06/06/13  Yes Nishant Dhungel, MD  Calcium Carbonate-Vit D-Min (CALCIUM 1200 PO) Take 1 tablet by mouth daily.   Yes Historical Provider, MD  carvedilol (COREG) 12.5 MG tablet Take 1 tablet (12.5 mg total) by mouth 2 (two) times daily with a meal. 06/06/13  Yes Nishant Dhungel, MD  dipyridamole-aspirin (AGGRENOX) 200-25 MG per 12 hr capsule Take 1 capsule by mouth 2 (two) times daily. 06/09/13  Yes Shanker Levora Dredge, MD  ezetimibe (ZETIA) 10 MG tablet Take 10 mg by mouth daily.   Yes Historical Provider, MD  ferrous fumarate (HEMOCYTE - 106 MG FE) 325 (106 FE) MG TABS tablet Take 1 tablet by mouth daily.   Yes Historical Provider, MD  insulin aspart (NOVOLOG) 100 UNIT/ML injection 0-9 Units, Subcutaneous, 3 times daily with meals CBG < 70: implement hypoglycemia protocol CBG 70 - 120: 0 units CBG 121 - 150: 1 unit CBG 151 - 200: 2 units CBG 201 - 250: 3 units CBG 251 - 300: 5 units CBG 301 - 350: 7 units CBG 351 - 400: 9 units CBG > 400: call MD 06/09/13  Yes Shanker Levora Dredge, MD  insulin detemir (LEVEMIR) 100 UNIT/ML injection Inject 0.45 mLs (45 Units total) into the skin 2 (two) times daily. 06/06/13  Yes Nishant Dhungel, MD  levothyroxine (SYNTHROID, LEVOTHROID) 75 MCG tablet Take 1 tablet (75 mcg total) by mouth daily before breakfast. 06/07/13  Yes  Nishant Dhungel, MD  liothyronine (CYTOMEL) 25 MCG tablet Take 25 mcg by mouth 2 (two) times daily.   Yes Historical Provider, MD  metFORMIN (GLUCOPHAGE) 500 MG tablet Take 500 mg by mouth 2 (two) times daily.    Yes Historical Provider, MD  Multiple Vitamin (MULTIVITAMIN WITH MINERALS) TABS tablet Take  1 tablet by mouth daily.   Yes Historical Provider, MD  Omega-3 Fatty Acids (FISH OIL) 1000 MG CAPS Take 2,000 mg by mouth daily.   Yes Historical Provider, MD  ranitidine (ZANTAC) 75 MG tablet Take 75 mg by mouth 2 (two) times daily.   Yes Historical Provider, MD  simvastatin (ZOCOR) 20 MG tablet Take 1 tablet (20 mg total) by mouth every evening. 06/06/13  Yes Nishant Dhungel, MD  Travoprost, BAK Free, (TRAVATAN) 0.004 % SOLN ophthalmic solution Place 1 drop into both eyes at bedtime.   Yes Historical Provider, MD    Inpatient medications: . carvedilol  12.5 mg Oral BID WC  . cefTRIAXone (ROCEPHIN)  IV  1 g Intravenous Q24H  . dextrose      . dipyridamole-aspirin  1 capsule Oral BID  . enoxaparin (LOVENOX) injection  30 mg Subcutaneous Q24H  . ezetimibe  10 mg Oral Daily  . famotidine  20 mg Oral BID  . insulin aspart  0-5 Units Subcutaneous QHS  . insulin aspart  0-9 Units Subcutaneous TID WC  . latanoprost  1 drop Both Eyes QHS  . levothyroxine  75 mcg Oral QAC breakfast  . liothyronine  25 mcg Oral BID  . multivitamin with minerals  1 tablet Oral Daily  . simvastatin  20 mg Oral QPM  . sodium chloride  3 mL Intravenous Q12H    Review of Systems - patient not able to provide many details so most of this below is from daughter and husband Gen:  Denies headache, fever, chills, sweats.  25 lb weight loss past month; generally weak, some lethargy HEENT:  Legally blind Resp:  + Subjective dyspnea and daughter notes her to "breathe hard" Cardiac:  No chest pain, orthopnea, PND.  Denies edema. GI:   Denies abdominal pain.   + nausea, vomiting, profuse watery diarrhea GU:  Minimal urine  output    MS:  Denies joint pain or swelling.   Derm:  Denies skin rash or itching.  No chronic skin conditions.  Neuro:   Lethargy, decreased ability to participate in any type of PT, etc Psych:  ? Of depression.    Physical Exam:  BP 128/49  Pulse 67  Temp(Src) 98.2 F (36.8 C) (Oral)  Resp 20  Ht 5\' 2"  (1.575 m)  Wt 99.8 kg (220 lb 0.3 oz)  BMI 40.23 kg/m2  SpO2 94% Gen: chronically ill appearing obese WF  Responds albeit slowly, able to answer some questions. Somewhat hyperpneic Skin: No rashes Neck: Cannot see neck veins Chest: Posteriorly free of rales, rhonchi or other adventitious sounds Heart: regular S1S2 N S3  Harsh 2/6 murmur upper right sternal border No diastolic murmur No rub Abdomen: obese, non distended. + BS No masses or tenderness Ext: cool. No edema noted. Neuro: Awake, oriented to person, place but very slow. Affect flat No definite asterixus Heme/Lymph: no bruising or LAN    Recent Labs Lab 07/01/13 1134 07/01/13 2000 07/02/13 0545  NA 127* 132* 131*  K 4.6 4.0 4.2  CL 95* 100 103  CO2 15* 17* 12*  GLUCOSE 192* 116* 37*  BUN 87* 86* 84*  CREATININE 7.83* 8.17* 7.93*  CALCIUM 9.5 8.6 8.6   Liver Function Tests:  Recent Labs Lab 07/01/13 1134  AST 14  ALT 12  ALKPHOS 66  BILITOT 0.4  PROT 6.5  ALBUMIN 3.2*    Recent Labs Lab 07/01/13 1134 07/02/13 0545  WBC 7.1 7.5  NEUTROABS 4.7  --   HGB  12.4 11.4*  HCT 35.3* 31.8*  MCV 88.5 88.6  PLT 167 200   Recent Labs Lab 07/01/13 1134  CKTOTAL 38  TROPONINI <0.30   CBG:  Recent Labs Lab 07/01/13 1807 07/01/13 2130 07/02/13 0738  GLUCAP 133* 92 109*   Xrays/Other Studies: US Renal  07/01/2013   CLINICAL DATA:  Acute renal failure  EXAM: RENAL/URINARY TRACT ULTRASOUND COMPLETE  COMPARISON:  None.  FINDINGS: Right Kidney  Length: Measures 12.8 cm in length. Echogenicity within normal limits. No mass or hydronephrosis visualized. Cyst within the inferior pole measures 1.6 x 1.4  x 1.5 cm.  Left Kidney  Length: Measures 12.1 cm. Echogenicity within normal limits. No mass or hydronephrosis visualized. Cyst within the upper pole measures 1.4 x 1.6 x 1.4 cm.  Bladder:  Collapsed around a Foley catheter.  IMPRESSION: 1. No hydronephrosis 2. Renal cysts.   Electronically Signed   By: Signa Kell M.D.   On: 07/01/2013 15:59   Dg Chest Port 1 View  07/01/2013   CLINICAL DATA:  Shortness of breath; hypertension  EXAM: PORTABLE CHEST - 1 VIEW  COMPARISON:  June 07, 2013  FINDINGS: There is no edema or consolidation. Heart size and pulmonary vascularity are normal. No adenopathy. There is degenerative change in both shoulders.  IMPRESSION: No edema or consolidation.   Electronically Signed   By: Bretta Bang   On: 07/01/2013 14:50   Dg Abd Portable 1v  07/01/2013   CLINICAL DATA:  Abdominal pain with nausea and diarrhea  EXAM: PORTABLE ABDOMEN - 1 VIEW  COMPARISON:  None.  FINDINGS: Bowel gas pattern is unremarkable. No obstruction or free air is seen on this supine examination. There is no abnormal calcification. There is arthropathy in the hip joints and lumbar spine.  Impression:  The bowel gas pattern is unremarkable.   Electronically Signed   By: Bretta Bang   On: 07/01/2013 14:48   Impression/Recommendations 72 yo WF with normal renal function at baseline, who presents with AKI and metabolic acidosis in the setting of diarrhea, volume depletion, possible UTI, with metformin and ARB on board  1. Acute kidney injury - multifactorial, secondary to diarrhea with volume depletion, relative hypotension with ARB. Likely hemodynamic in origin. May have transitionied to ATN.  Agree with volume resuscitation and have instructed nursing to hang isotonic sodium bicarbonate replacement fluids as ordered (still getting plain NS despite order).  She is making small amounts of urine, but appears may be picking up some in the last hour or so. Still room to go with hydration.  If she  does not turn around in the next 24-48 hours may require transfer to [facility] for dialysis but would like to see how reponds to volume first. Strict I and O. 2. Metabolic acidosis with gap of 16 - due to renal failure plus diarrhea plus metformin. Fluids to be given as isotonic sodium bicarbonate at 125 cc/hour. 3. Diarrhea - Stool studies sent 4. DM - per primary service; no metformin in the setting of AKI 5. Possible UTI - agree with rocephin pending cultures 6. HTN - HYPOtension on adm -->ARB on hold. Still on carvedilol.  Would back off on dose to 6.25 BID and hold for SBP <110 7. H/O CVA - on aggrenox 8. Anemia - mild. May see drop in Hb with hydration.  Thanks for the consult.  Will follow closely with you.    Camille Bal,  MD [facility] [phone] pager 07/02/2013, 12:27 PM

## 2013-07-02 NOTE — Progress Notes (Signed)
  Hypoglycemic Event  CBG: 37 Treatment: Dextrose 50% 25ml  Symptoms: Lethergy--arouses to name call  Follow-up CBG: Time:0730CBG Result:109  Possible Reasons for Event: decrease PO intake and IVF with NS@150  ml/hr  Comments/MD notified:text message sent to MD    Charmian Muff RN  Remember to initiate Hypoglycemia Order Set & complete

## 2013-07-02 NOTE — Progress Notes (Signed)
PT Cancellation Note  Patient Details Name: Regina [name] MRN: [id number] DOB: 29-Jan-1941   Cancelled Treatment:    Reason Eval/Treat Not Completed: Medical issues which prohibited therapy. Pt having vomiting and diarrhea, also acute renal failure. RN recommended PT be held today. Will re-attempt tomorrow.    Ralene Bathe Kistler 07/02/2013, 1:03 PM [phone]

## 2013-07-03 DIAGNOSIS — R4182 Altered mental status, unspecified: Secondary | ICD-10-CM

## 2013-07-03 DIAGNOSIS — E1110 Type 2 diabetes mellitus with ketoacidosis without coma: Secondary | ICD-10-CM

## 2013-07-03 DIAGNOSIS — I10 Essential (primary) hypertension: Secondary | ICD-10-CM

## 2013-07-03 LAB — URINE CULTURE: Colony Count: >=100000

## 2013-07-03 LAB — RENAL FUNCTION PANEL
Albumin: 2.4 g/dL — ABNORMAL LOW (ref 3.5–5.2)
CO2: 17 mEq/L — ABNORMAL LOW (ref 19–32)
Calcium: 7.8 mg/dL — ABNORMAL LOW (ref 8.4–10.5)
Chloride: 94 mEq/L — ABNORMAL LOW (ref 96–112)
Creatinine, Ser: 6.81 mg/dL — ABNORMAL HIGH (ref 0.50–1.10)
GFR calc non Af Amer: 5 mL/min — ABNORMAL LOW (ref >90)
Phosphorus: 4.6 mg/dL (ref 2.3–4.6)

## 2013-07-03 LAB — CBC
MCH: 30.7 pg (ref 26.0–34.0)
MCHC: 35 g/dL (ref 30.0–36.0)
MCV: 87.6 fL (ref 78.0–100.0)
Platelets: 175 10*3/uL (ref 150–400)
RBC: 3.39 MIL/uL — ABNORMAL LOW (ref 3.87–5.11)
RDW: 14.5 % (ref 11.5–15.5)

## 2013-07-03 LAB — GLUCOSE, CAPILLARY
Glucose-Capillary: 304 mg/dL — ABNORMAL HIGH (ref 70–99)
Glucose-Capillary: 241 mg/dL — ABNORMAL HIGH (ref 70–99)
Glucose-Capillary: 332 mg/dL — ABNORMAL HIGH (ref 70–99)

## 2013-07-03 MED ORDER — FAMOTIDINE 20 MG PO TABS
20.0000 mg | ORAL_TABLET | Freq: Every day | ORAL | Status: DC
  Administered 2013-07-03 – 2013-07-05 (×3): 20 mg via ORAL
  Filled 2013-07-03 (×4): qty 1

## 2013-07-03 MED ORDER — GLUCERNA SHAKE PO LIQD
237.0000 mL | Freq: Two times a day (BID) | ORAL | Status: DC
  Administered 2013-07-03 – 2013-07-04 (×2): 237 mL via ORAL
  Filled 2013-07-03 (×10): qty 237

## 2013-07-03 NOTE — Progress Notes (Signed)
Inpatient Diabetes Program Recommendations  AACE/ADA: New Consensus Statement on Inpatient Glycemic Control (2013)  Target Ranges:  Prepandial:   less than 140 mg/dL      Peak postprandial:   less than 180 mg/dL (1-2 hours)      Critically ill patients:  140 - 180 mg/dL   Results for Regina [name], Regina [name] (MRN [id number]) as of 07/03/2013 11:24  Ref. Range 07/01/2013 18:07 07/01/2013 21:30 07/02/2013 07:38 07/02/2013 11:43 07/02/2013 16:58 07/02/2013 21:33 07/03/2013 08:19  Glucose-Capillary Latest Range: 70-99 mg/dL 308 (H) 92 657 (H) 846 (H) 120 (H) 151 (H) 291 (H)   Results for Regina [name], Regina [name] (MRN [id number]) as of 07/03/2013 11:24  Ref. Range 07/02/2013 05:45 07/03/2013 05:35  Glucose Latest Range: 70-99 mg/dL 37 (LL) 324 (H)   Inpatient Diabetes Program Recommendations Insulin - Basal: Please consider ordering low dose basal insulin; recommend starting with Levemir 10 units BID and adjust accordingly.  Note: According to the chart, patient has a history of diabetes and takes Levemir 45 units BID, Novolog 0-9 units AC, and Metformin 500 mg BID as an outpatient for diabetes management.  Currently, patient is ordered to receive Novolog 0-9 units AC and Novolog 0-5 units HS for inpatient glycemic control.  Initial lab blood glucose 192 mg/dl on 4/01 @11 :34am.  Noted an order for Levemir 45 units BID was ordered on 9/20 and patient received Levemir 45 units on 9/20 @ 23:14.  Noted hypoglycemia on 9/21 on am labs with blood glucose of 37 mg/dl likely due to Levemir and acute renal failure.  Order for Levemir 45 units BID was discontinued on 9/21 @ 8:40 and patient has not received any basal insulin since the Levemir 45 units given on 9/20.  Fasting lab glucose this morning was 306 mg/dl.   Please consider reordering basal insulin at a lower dose.  Recommend starting with Levemir 10 units BID and adjust accordingly. Will continue to follow.  Thanks, Orlando Penner, RN, MSN, CCRN Diabetes Coordinator Inpatient Diabetes  Program [phone] (Team Pager) [phone] (AP office) [phone] [facility] office)

## 2013-07-03 NOTE — Progress Notes (Signed)
[facility] PROGRESS NOTE  Regina [name] JYN:[id number] DOB: May 26, 1941 DOA: 07/01/2013 PCP: Michiel Sites, MD  Assessment/Plan: Acute renal failure, oliguric/with worsening metabolic acidosis  - Multifactorial: Dehydration, ARB, possible ATN  -continue HCO3 drip and appreciate renal assistance pt still appears to be uremic and may require dialysis at least short term>> but cr beginning to improve as well as acidosis.  - renal ultrasound with no hydronephrosis, cysts noted - Continue Foley catheter, closely monitor UOP - Continue holding ARB and metformin  -I updated daughter at bedside Dehydration with hyponatremia  - Multifactorial: Intermittent diarrhea and vomiting, poor oral intake and hyperglycemia on admission  - continue IV fluids as above, and monitor BMPs.  UTI  - Continue IV Rocephin pending urine culture results.  Intermittent diarrhea  - KUB with unremarkable bowel gas pattern - Improving, await C. difficile PCR   Uncontrolled type II DM with hypoglycemia - Holding Metformin  - continue holding Levemir for now and continue NovoLog SSI with hypoglycemia protocol.  Hypertension  - Patient initially came in mildly hypotensive, possibly from dehydration.  - Improved. Continue Holding  Exforge  - Continue Carvedilol.  Hypothyroidism  - Continue prior home medications.  History of CVA  - Continue Aggrenox.  History of anxiety and depression   Code Status: full Family Communication: daughter at bedside Disposition Plan:    Consultants:  RENAL  Procedures:  NONE  Antibiotics:  Rocephin started 9/20  HPI/Subjective: Pt more alert today but disoriented, +nausea  Objective: Filed Vitals:   07/03/13 0600  BP: 132/49  Pulse: 74  Temp: 98 F (36.7 C)  Resp: 18    Intake/Output Summary (Last 24 hours) at 07/03/13 1207 Last data filed at 07/03/13 0700  Gross per 24 hour  Intake   3429 ml  Output   1045 ml  Net   2384 ml   Filed Weights   07/01/13 1440 07/03/13 0600  Weight: 99.8 kg (220 lb 0.3 oz) 106.142 kg (234 lb)    Exam:  General: somnolent oriented x 1 In NAD Cardiovascular: RRR, nl S1 s2 Respiratory: CTAB Abdomen: soft +BS NT/ND, no masses palpable Extremities: No cyanosis and trace bil hand edema    Data Reviewed: Basic Metabolic Panel:  Recent Labs Lab 07/01/13 1134 07/01/13 2000 07/02/13 0545 07/03/13 0535  NA 127* 132* 131* 134*  K 4.6 4.0 4.2 3.6  CL 95* 100 103 94*  CO2 15* 17* 12* 17*  GLUCOSE 192* 116* 37* 306*  BUN 87* 86* 84* 78*  CREATININE 7.83* 8.17* 7.93* 6.81*  CALCIUM 9.5 8.6 8.6 7.8*  PHOS  --   --   --  4.6   Liver Function Tests:  Recent Labs Lab 07/01/13 1134 07/03/13 0535  AST 14  --   ALT 12  --   ALKPHOS 66  --   BILITOT 0.4  --   PROT 6.5  --   ALBUMIN 3.2* 2.4*   No results found for this basename: LIPASE, AMYLASE,  in the last 168 hours No results found for this basename: AMMONIA,  in the last 168 hours CBC:  Recent Labs Lab 07/01/13 1134 07/02/13 0545 07/03/13 0535  WBC 7.1 7.5 4.9  NEUTROABS 4.7  --   --   HGB 12.4 11.4* 10.4*  HCT 35.3* 31.8* 29.7*  MCV 88.5 88.6 87.6  PLT 167 200 175   Cardiac Enzymes:  Recent Labs Lab 07/01/13 1134  CKTOTAL 38  TROPONINI <0.30   BNP (last 3 results) No results found  for this basename: PROBNP,  in the last 8760 hours CBG:  Recent Labs Lab 07/02/13 0738 07/02/13 1143 07/02/13 1658 07/02/13 2133 07/03/13 0819  GLUCAP 109* 131* 120* 151* 291*    Recent Results (from the past 240 hour(s))  URINE CULTURE     Status: None   Collection Time    07/01/13 12:37 PM      Result Value Range Status   Specimen Description URINE, CATHETERIZED   Final   Special Requests NONE   Final   Culture  Setup Time     Final   Value: 07/01/2013 20:06     Performed at [facility] Count     Final   Value: >=100,000 COLONIES/ML     Performed at [facility]   Culture     Final   Value:  ESCHERICHIA COLI     Performed at [facility]   Report Status 07/03/2013 FINAL   Final   Organism ID, Bacteria ESCHERICHIA COLI   Final     Studies: US Renal  07/01/2013   CLINICAL DATA:  Acute renal failure  EXAM: RENAL/URINARY TRACT ULTRASOUND COMPLETE  COMPARISON:  None.  FINDINGS: Right Kidney  Length: Measures 12.8 cm in length. Echogenicity within normal limits. No mass or hydronephrosis visualized. Cyst within the inferior pole measures 1.6 x 1.4 x 1.5 cm.  Left Kidney  Length: Measures 12.1 cm. Echogenicity within normal limits. No mass or hydronephrosis visualized. Cyst within the upper pole measures 1.4 x 1.6 x 1.4 cm.  Bladder:  Collapsed around a Foley catheter.  IMPRESSION: 1. No hydronephrosis 2. Renal cysts.   Electronically Signed   By: Signa Kell M.D.   On: 07/01/2013 15:59   Dg Chest Port 1 View  07/01/2013   CLINICAL DATA:  Shortness of breath; hypertension  EXAM: PORTABLE CHEST - 1 VIEW  COMPARISON:  June 07, 2013  FINDINGS: There is no edema or consolidation. Heart size and pulmonary vascularity are normal. No adenopathy. There is degenerative change in both shoulders.  IMPRESSION: No edema or consolidation.   Electronically Signed   By: Bretta Bang   On: 07/01/2013 14:50   Dg Abd Portable 1v  07/01/2013   CLINICAL DATA:  Abdominal pain with nausea and diarrhea  EXAM: PORTABLE ABDOMEN - 1 VIEW  COMPARISON:  None.  FINDINGS: Bowel gas pattern is unremarkable. No obstruction or free air is seen on this supine examination. There is no abnormal calcification. There is arthropathy in the hip joints and lumbar spine.  Impression:  The bowel gas pattern is unremarkable.   Electronically Signed   By: Bretta Bang   On: 07/01/2013 14:48    Scheduled Meds: . carvedilol  6.25 mg Oral BID WC  . cefTRIAXone (ROCEPHIN)  IV  1 g Intravenous Q24H  . dipyridamole-aspirin  1 capsule Oral BID  . enoxaparin (LOVENOX) injection  30 mg Subcutaneous Q24H  . ezetimibe  10  mg Oral Daily  . famotidine  20 mg Oral Daily  . insulin aspart  0-5 Units Subcutaneous QHS  . insulin aspart  0-9 Units Subcutaneous TID WC  . latanoprost  1 drop Both Eyes QHS  . levothyroxine  75 mcg Oral QAC breakfast  . liothyronine  25 mcg Oral BID  . multivitamin with minerals  1 tablet Oral Daily  . simvastatin  20 mg Oral QPM  . sodium chloride  3 mL Intravenous Q12H   Continuous Infusions: .  sodium bicarbonate 150  mEq in sterile water 1000 mL infusion 125 mL/hr at 07/03/13 0902    Principal Problem:   Acute renal failure Active Problems:   HYPOTHYROIDISM   DIABETES MELLITUS, TYPE II   HYPERLIPIDEMIA   UTI (urinary tract infection)   HTN (hypertension)   Anxiety and depression   CVA (cerebral infarction)   Weakness generalized   Dehydration   Failure to thrive   Diarrhea   Hyponatremia   Metabolic acidosis    Time spent: 35    Akif Weldy C  [facility] Pager [phone]. If 7PM-7AM, please contact night-coverage at [email or web address], password [facility] 07/03/2013, 12:07 PM  LOS: 2 days

## 2013-07-03 NOTE — Evaluation (Signed)
Physical Therapy Evaluation Patient Details Name: Regina [name] MRN: [id number] DOB: 09-Jan-1941 Today's Date: 07/03/2013 Time: 1050-1105 PT Time Calculation (min): 15 min  PT Assessment / Plan / Recommendation History of Present Illness  72 y.o. female has had a complicated medical course over the last 1 month. Patient is unable to provide history due to weakness and? Dementia. History is provided by patient's daughter Regina [name] who is at the bedside. 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 She was recently admitted twice at [facility]-06/04/13-06/06/13 (DKA) & 06/07/13-06/09/13 (metabolic encephalopathy, uncontrolled DM and CVA). Following the last discharge, patient was sent to rehabilitation where she was for approximately 3 weeks. Patient has had a sustained rapid decline at SNF. 2 days after going to SNF, she developed profuse diarrhea of 9 days duration, extremely poor appetite, lethargy, laying in bed and unwilling to participate in therapies, decreased urine output and progressive generalized weakness.   Clinical Impression  **Pt would benefit from acute PT to address problems listed below, in order to maximize safety and independence with mobility. *    PT Assessment  Patient needs continued PT services    Follow Up Recommendations  SNF;Supervision/Assistance - 24 hour    Does the patient have the potential to tolerate intense rehabilitation      Barriers to Discharge        Equipment Recommendations   (TBD)    Recommendations for Other Services OT consult   Frequency Min 3X/week    Precautions / Restrictions Precautions Precautions: Fall Restrictions Weight Bearing Restrictions: No   Pertinent Vitals/Pain **0/10 pain*      Mobility  Bed Mobility Bed Mobility: Scooting to HOB Scooting to HOB: 1: +2 Total assist Scooting to [facility]: Patient Percentage: 10% Details  for Bed Mobility Assistance: pt attempted to participate with scooting in bed to reposition; supine to sit deferred 2* pt having nausea, stated she felt like she would throw up, RN notified Transfers Transfers: Not assessed Details for Transfer Assistance: deferred 2* nausea    Exercises     PT Diagnosis: Generalized weakness  PT Problem List: Decreased strength;Decreased activity tolerance;Decreased mobility PT Treatment Interventions: Gait training;Functional mobility training;Therapeutic activities;Therapeutic exercise;Patient/family education     PT Goals(Current goals can be found in the care plan section) Acute Rehab PT Goals Patient Stated Goal: none stated PT Goal Formulation: Patient unable to participate in goal setting Time For Goal Achievement: 07/17/13 Potential to Achieve Goals: Fair  Visit Information  Last PT Received On: 07/03/13 Assistance Needed: +2 History of Present Illness: 72 y.o. female has had a complicated medical course over the last 1 month. Patient is unable to provide history due to weakness and? Dementia. History is provided by patient's daughter Regina [name] who is at the bedside. 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 She was recently admitted twice at [facility]-06/04/13-06/06/13 (DKA) & 06/07/13-06/09/13 (metabolic encephalopathy, uncontrolled DM and CVA). Following the last discharge, patient was sent to rehabilitation where she was for approximately 3 weeks. Patient has had a sustained rapid decline at SNF. 2 days after going to SNF, she developed profuse diarrhea of 9 days duration, extremely poor appetite, lethargy, laying in bed and unwilling to participate in therapies, decreased urine output and progressive generalized weakness.        Prior Functioning  Home Living Family/patient expects to be discharged to::  Skilled nursing facility Living  Arrangements: Other (Comment) Home Equipment: Gil[employer] Mor - single point;Walker - standard Prior Function Level of Independence: Independent with assistive device(s) Comments: per chart from 3 weeks ago, pt unable to provide hx 2* confusion Communication Communication: No difficulties Dominant Hand: Right    Cognition  Cognition Arousal/Alertness: Awake/alert Overall Cognitive Status: No family/caregiver present to determine baseline cognitive functioning (h/o dementia, pt not oriented to place or situation)    Extremity/Trunk Assessment Upper Extremity Assessment Upper Extremity Assessment: Defer to OT evaluation Lower Extremity Assessment Lower Extremity Assessment: Generalized weakness (RLE 3/5 grossly, LLE -3/5 grossly)   Balance    End of Session PT - End of Session Activity Tolerance: Treatment limited secondary to medical complications (Comment) Patient left: in bed;with call bell/phone within reach Nurse Communication: Mobility status  GP     Ralene Bathe Kistler 07/03/2013, 11:18 AM  [phone]

## 2013-07-03 NOTE — Evaluation (Signed)
Occupational Therapy Evaluation Patient Details Name: Regina [name] MRN: [id number] DOB: [birth year] Today's Date: 07/03/2013 Time: 4132-4401 OT Time Calculation (min): 15 min  OT Assessment / Plan / Recommendation History of present illness 72 y.o. female has had a complicated medical course over the last 1 month. Patient is unable to provide history due to weakness and? Dementia. History is provided by patient's daughter Ms. Regina [name] who is at the bedside. 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 She was recently admitted twice at [facility]-06/04/13-06/06/13 (DKA) & 06/07/13-06/09/13 (metabolic encephalopathy, uncontrolled DM and CVA). Following the last discharge, patient was sent to rehabilitation where she was for approximately 3 weeks. Patient has had a sustained rapid decline at SNF. 2 days after going to SNF, she developed profuse diarrhea of 9 days duration, extremely poor appetite, lethargy, laying in bed and unwilling to participate in therapies, decreased urine output and progressive generalized weakness.    Clinical Impression   Pt eval limited by nausea. Nursing made aware. Will benefit from skilled OT to increase independence with ADL tasks for next venue of care.     OT Assessment  Patient needs continued OT Services    Follow Up Recommendations  Supervision/Assistance - 24 hour;SNF    Barriers to Discharge      Equipment Recommendations  3 in 1 bedside comode    Recommendations for Other Services    Frequency  Min 2X/week    Precautions / Restrictions Precautions Precautions: Fall Restrictions Weight Bearing Restrictions: No   Pertinent Vitals/Pain No complaint of    ADL  Grooming: Simulated;Wash/dry face;Set up Where Assessed - Grooming: Supine, head of bed up Lower Body Dressing: Simulated;+1 Total assistance;Other (comment) (socks only) ADL Comments: Limited  eval due to pt feeling nauseous even with repositioning in bed. Nursing made aware. Pt tried to assist with scooting hips over in bed but limited ability to self assist today.     OT Diagnosis: Generalized weakness  OT Problem List: Decreased strength;Decreased activity tolerance;Decreased knowledge of use of DME or AE OT Treatment Interventions: Self-care/ADL training;DME and/or AE instruction;Therapeutic activities;Patient/family education   OT Goals(Current goals can be found in the care plan section) Acute Rehab OT Goals Patient Stated Goal: none stated OT Goal Formulation: Patient unable to participate in goal setting Time For Goal Achievement: 07/17/13 Potential to Achieve Goals: Good  Visit Information  Last OT Received On: 07/03/13 Assistance Needed: +2 PT/OT Co-Evaluation/Treatment: Yes History of Present Illness: 72 y.o. female has had a complicated medical course over the last 1 month. Patient is unable to provide history due to weakness and? Dementia. History is provided by patient's daughter Ms. Regina [name] who is at the bedside. 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 She was recently admitted twice at [facility]-06/04/13-06/06/13 (DKA) & 06/07/13-06/09/13 (metabolic encephalopathy, uncontrolled DM and CVA). Following the last discharge, patient was sent to rehabilitation where she was for approximately 3 weeks. Patient has had a sustained rapid decline at SNF. 2 days after going to SNF, she developed profuse diarrhea of 9 days duration, extremely poor appetite, lethargy, laying in bed and unwilling to participate in therapies, decreased urine output and progressive generalized weakness.        Prior Functioning     Home Living Family/patient expects to be discharged to:: Skilled nursing facility Living Arrangements: Other (Comment) Home Equipment: Gil[employer] Mor - single  point;Walker -  standard Prior Function Level of Independence: Independent with assistive device(s) Comments: per chart from 3 weeks ago, pt unable to provide hx 2* confusion Communication Communication: No difficulties Dominant Hand: Right         Vision/Perception Vision - History Baseline Vision: Legally blind   Cognition  Cognition Arousal/Alertness: Awake/alert Overall Cognitive Status: No family/caregiver present to determine baseline cognitive functioning    Extremity/Trunk Assessment Upper Extremity Assessment Upper Extremity Assessment: Generalized weakness Lower Extremity Assessment Lower Extremity Assessment: Generalized weakness (RLE 3/5 grossly, LLE -3/5 grossly)     Mobility Bed Mobility Bed Mobility: Scooting to HOB Scooting to HOB: 1: +2 Total assist Scooting to [facility]: Patient Percentage: 10% Details for Bed Mobility Assistance: pt attempted to participate with scooting in bed to reposition; supine to sit deferred 2* pt having nausea, stated she felt like she would throw up, RN notified Transfers Details for Transfer Assistance: deferred 2* nausea     Exercise     Balance     End of Session OT - End of Session Activity Tolerance: Other (comment) (nausea) Patient left: in bed;with call bell/phone within reach;with bed alarm set  GO     [name] [phone] 07/03/2013, 12:28 PM

## 2013-07-03 NOTE — Progress Notes (Signed)
INITIAL NUTRITION ASSESSMENT  DOCUMENTATION CODES Per approved criteria  -Severe malnutrition in the context of chronic illness -Morbid Obesity   INTERVENTION: Recommend removal of renal restrictions to diet to offer more po variety given very poor oral intake and current phos and potassium WNL. Add Glucerna Shake po BID, each supplement provides 220 kcal and 10 grams of protein. Monitor magnesium, potassium, and phosphorus daily for at least 3 days, MD to replete as needed, as pt is at risk for refeeding syndrome given ongoing very poor oral intake. RD to continue to follow nutrition care plan.  NUTRITION DIAGNOSIS: Inadequate oral intake related to poor appetite as evidenced by ongoing weight loss and poor meal completion.   Goal: Intake to meet >90% of estimated nutrition needs.  Monitor:  weight trends, lab trends, I/O's, PO intake, supplement tolerance  Reason for Assessment: Malnutrition Screening Tool  72 y.o. female  Admitting Dx: Acute renal failure  ASSESSMENT: PMHx significant for DM2, retinopathy, legal blindness, HTN, CVA, hypothyroidism, anxiety and depression. Noted pt with 2 recent admissions to [facility] within the past month. Pt was d/c'd to rehab for about 3 weeks after hospitalization. Pt developed diarrhea, poor appetite, and overall decline x 9 days at [facility]. Family thought decline was 2/2 lack of care at SNF, pt was then sent home. Remains weak and bed bound. Reported weight loss of 25 lb x 1 month.  Work-up reveals acute renal failure. Seen by renal MD on 9/21. Noted that pt may require tx to [facility] for HD.  Pt with 5.4% wt loss x 1 month per EPIC weight hx. Pt meets criteria for severe MALNUTRITION in the context of chronic illness as evidenced by intake of <75% x at least 1 month and wt loss of >5% x 1 month.  Potassium and phosphorus are currently WNL. Blood sugars are elevated, ranging from 151 - 306.  Sodium is trending up, currently 134.  Discussed oral intake  with daughter at bedside, who confirms all of the above information. She states that pt has been vomiting most "bites" of foods that she has been consuming. Pt ate half of a grilled cheese on Sunday and a few bites of yogurt yesterday (that she vomited.) Pt is at risk for refeeding syndrome given very poor oral intake.   Height: Ht Readings from Last 1 Encounters:  07/01/13 5\' 2"  (1.575 m)    Weight: Wt Readings from Last 1 Encounters:  07/03/13 234 lb (106.142 kg)  Admit wt 220 lb (currently wt likely elevated 2/2 +5 L net I/O's since admit)  Ideal Body Weight: 110 lb  % Ideal Body Weight: 200%  Wt Readings from Last 10 Encounters:  07/03/13 234 lb (106.142 kg)  06/08/13 229 lb 1.6 oz (103.919 kg)  06/05/13 233 lb 4 oz (105.8 kg)  04/20/07 264 lb (119.75 kg)    Usual Body Weight: 230 - 235 lb  % Usual Body Weight: 94.6%  BMI:  Body mass index is 42.79 kg/(m^2). Obese Class III  Estimated Nutritional Needs: Kcal: 1500 - 1700 Protein: 95 - 105 g Fluid: 1.2 liters  Skin: intact  Diet Order: Renal 60/70; Carbohydrate Modified Medium diet; 1200 ml fluid restriction  EDUCATION NEEDS: -No education needs identified at this time   Intake/Output Summary (Last 24 hours) at 07/03/13 1138 Last data filed at 07/03/13 0700  Gross per 24 hour  Intake   3429 ml  Output   1045 ml  Net   2384 ml    Last BM: 9/22  Labs:   Recent Labs Lab 07/01/13 2000 07/02/13 0545 07/03/13 0535  NA 132* 131* 134*  K 4.0 4.2 3.6  CL 100 103 94*  CO2 17* 12* 17*  BUN 86* 84* 78*  CREATININE 8.17* 7.93* 6.81*  CALCIUM 8.6 8.6 7.8*  PHOS  --   --  4.6  GLUCOSE 116* 37* 306*    CBG (last 3)   Recent Labs  07/02/13 1658 07/02/13 2133 07/03/13 0819  GLUCAP 120* 151* 291*    Scheduled Meds: . carvedilol  6.25 mg Oral BID WC  . cefTRIAXone (ROCEPHIN)  IV  1 g Intravenous Q24H  . dipyridamole-aspirin  1 capsule Oral BID  . enoxaparin (LOVENOX) injection  30 mg Subcutaneous  Q24H  . ezetimibe  10 mg Oral Daily  . famotidine  20 mg Oral Daily  . insulin aspart  0-5 Units Subcutaneous QHS  . insulin aspart  0-9 Units Subcutaneous TID WC  . latanoprost  1 drop Both Eyes QHS  . levothyroxine  75 mcg Oral QAC breakfast  . liothyronine  25 mcg Oral BID  . multivitamin with minerals  1 tablet Oral Daily  . simvastatin  20 mg Oral QPM  . sodium chloride  3 mL Intravenous Q12H    Continuous Infusions: .  sodium bicarbonate 150 mEq in sterile water 1000 mL infusion 125 mL/hr at 07/03/13 1610    Past Medical History  Diagnosis Date  . Hypertension   . Hypercholesteremia   . TIA (transient ischemic attack) 1990's    "mini strokes" (06/07/2013)  . Legally blind     "both eyes; some vision in the right' (06/07/2013)  . Heart murmur   . Hypothyroidism   . Type II diabetes mellitus   . Anemia   . GERD (gastroesophageal reflux disease)   . Arthritis     "left leg" (06/07/2013)  . Depression   . Vulva cancer   . Altered mental status     "the first time I noticed any problem was 2 d ago" (06/07/2013)    Past Surgical History  Procedure Laterality Date  . Femur fracture surgery Left 1999  . Cesarean section  1968  . Tubal ligation  1970's  . Vulva surgery  1990's    "cut a big chunk out for cancer" (06/07/2013)    Jarold Motto MS, RD, LDN Pager: [phone] After-hours pager: [phone] \

## 2013-07-03 NOTE — Progress Notes (Signed)
Clinical Social Work Department CLINICAL SOCIAL WORK PLACEMENT NOTE 07/03/2013  Patient:  Regina [name], Regina [name]  Account Number:  [id number] Admit date:  07/01/2013  Clinical Social Worker:  Orpah Greek  Date/time:  07/03/2013 03:10 PM  Clinical Social Work is seeking post-discharge placement for this patient at the following level of care:   SKILLED NURSING   (*CSW will update this form in Epic as items are completed)   07/03/2013  Patient/family provided with [facility] Department of Clinical Social Work's list of facilities offering this level of care within the geographic area requested by the patient (or if unable, by the patient's family).  07/03/2013  Patient/family informed of their freedom to choose among providers that offer the needed level of care, that participate in Medicare, Medicaid or managed care program needed by the patient, have an available bed and are willing to accept the patient.  07/03/2013  Patient/family informed of [facility]' ownership interest in [facility], as well as of the fact that they are under no obligation to receive care at this facility.  PASARR submitted to EDS on 07/03/2013 PASARR number received from EDS on 07/03/2013  FL2 transmitted to all facilities in geographic area requested by pt/family on  07/03/2013 FL2 transmitted to all facilities within larger geographic area on   Patient informed that his/her managed care company has contracts with or will negotiate with  certain facilities, including the following:     Patient/family informed of bed offers received:   Patient chooses bed at  Physician recommends and patient chooses bed at    Patient to be transferred to  on   Patient to be transferred to facility by   The following physician request were entered in Epic:   Additional Comments:   Unice Bailey, LCSW [facility] Clinical Social Worker cell #: [phone]

## 2013-07-03 NOTE — Progress Notes (Signed)
Renal Service Daily Progress Note [facility]  Subjective: Still confused, UOP good , creat down to 6.8 this am  Physical Exam:  Blood pressure 140/50, pulse 77, temperature 98.7 F (37.1 C), temperature source Oral, resp. rate 17, height 5\' 2"  (1.575 m), weight 106.142 kg (234 lb), SpO2 98.00%. Gen: elderly WF , confused, no distress, mild asterixis, pleasant Skin: no rash, cyanosis HEENT:  EOMI, sclera anicteric, throat dry Neck: no JVD, no LAN Chest: clear to bases bilat CV: regular, harsh 2-3/6 SEM >> to the L axilla  Abdomen: soft, obese, nontender, no mass or HSM Ext: no LE edema , no joint effusion, no gangrene/ulcers Neuro: alert, "hospital" "1971"  Impression/Rec: 1. AKI due to vol depletion / ARB / hypotension- pt is uremic but UOP good and creat falling, does not need acute HD at this time.  Cont IVF, still vol depleted 2. AMS- due to uremia, acute illness 3. Diarrhea- per primary 4. Met acidosis- cont bicarb gtt 5. DM - per primary service; no metformin in the setting of AKI 6. EColi UTI- may have been precipitating event 7. Hypotension- improved, BP normal today, ok for lowdose coreg, holding ARB 8. H/O CVA - on aggrenox     Vinson Moselle  MD Pager [phone]    Cell  [phone] 07/03/2013, 6:16 PM    Recent Labs Lab 07/01/13 2000 07/02/13 0545 07/03/13 0535  NA 132* 131* 134*  K 4.0 4.2 3.6  CL 100 103 94*  CO2 17* 12* 17*  GLUCOSE 116* 37* 306*  BUN 86* 84* 78*  CREATININE 8.17* 7.93* 6.81*  CALCIUM 8.6 8.6 7.8*  PHOS  --   --  4.6    Recent Labs Lab 07/01/13 1134 07/03/13 0535  AST 14  --   ALT 12  --   ALKPHOS 66  --   BILITOT 0.4  --   PROT 6.5  --   ALBUMIN 3.2* 2.4*    Recent Labs Lab 07/01/13 1134 07/02/13 0545 07/03/13 0535  WBC 7.1 7.5 4.9  NEUTROABS 4.7  --   --   HGB 12.4 11.4* 10.4*  HCT 35.3* 31.8* 29.7*  MCV 88.5 88.6 87.6  PLT 167 200 175   . carvedilol  6.25 mg Oral BID WC  . cefTRIAXone  (ROCEPHIN)  IV  1 g Intravenous Q24H  . dipyridamole-aspirin  1 capsule Oral BID  . enoxaparin (LOVENOX) injection  30 mg Subcutaneous Q24H  . ezetimibe  10 mg Oral Daily  . famotidine  20 mg Oral Daily  . feeding supplement  237 mL Oral BID BM  . insulin aspart  0-5 Units Subcutaneous QHS  . insulin aspart  0-9 Units Subcutaneous TID WC  . latanoprost  1 drop Both Eyes QHS  . levothyroxine  75 mcg Oral QAC breakfast  . liothyronine  25 mcg Oral BID  . multivitamin with minerals  1 tablet Oral Daily  . simvastatin  20 mg Oral QPM  . sodium chloride  3 mL Intravenous Q12H   .  sodium bicarbonate 150 mEq in sterile water 1000 mL infusion 125 mL/hr at 07/03/13 1749   acetaminophen, acetaminophen, albuterol, ondansetron (ZOFRAN) IV, ondansetron

## 2013-07-03 NOTE — Progress Notes (Signed)
Clinical Social Work Department BRIEF PSYCHOSOCIAL ASSESSMENT 07/03/2013  Patient:  Regina [name], Regina [name]     Account Number:  [id number]     Admit date:  07/01/2013  Clinical Social Worker:  Orpah Greek  Date/Time:  07/03/2013 03:05 PM  Referred by:  Physician  Date Referred:  07/03/2013 Referred for  SNF Placement   Other Referral:   Interview type:  Family Other interview type:   patient's niece, Regina [name] at bedside    PSYCHOSOCIAL DATA Living Status:  ALONE Admitted from facility:   Level of care:   Primary support name:  Regina [name] (daughter) h#: [phone] c#: [phone] Primary support relationship to patient:  CHILD, ADULT Degree of support available:   good    CURRENT CONCERNS Current Concerns  Post-Acute Placement   Other Concerns:    SOCIAL WORK ASSESSMENT / PLAN CSW spoke with patient's niece, Regina [name] at bedside re: discharge planning. Patient was just at [facility] 8/27-9/18 and discharged home 9/18 for 1 day before coming back to the hospital. PT recommended she go back to SNF at discharge.   Assessment/plan status:  Information/Referral to [facility] Other assessment/ plan:   Information/referral to community resources:   CSW completed FL2 and faxed information to [facility] - provided list of facilities to niece, awaiting bed offers.    PATIENT'S/FAMILY'S RESPONSE TO PLAN OF CARE: Patient was sleeping, but niece at bedside informed CSW that family would prefer that she not return to [facility]. CSW will provide bed offers when available.       Regina Bailey, LCSW [facility] Clinical Social Worker cell #: [phone]

## 2013-07-04 ENCOUNTER — Ambulatory Visit: Payer: Medicare Other | Admitting: Internal Medicine

## 2013-07-04 DIAGNOSIS — N19 Unspecified kidney failure: Secondary | ICD-10-CM

## 2013-07-04 DIAGNOSIS — E119 Type 2 diabetes mellitus without complications: Secondary | ICD-10-CM

## 2013-07-04 DIAGNOSIS — E039 Hypothyroidism, unspecified: Secondary | ICD-10-CM

## 2013-07-04 LAB — GLUCOSE, CAPILLARY
Glucose-Capillary: 530 mg/dL — ABNORMAL HIGH (ref 70–99)
Glucose-Capillary: 415 mg/dL — ABNORMAL HIGH (ref 70–99)
Glucose-Capillary: 433 mg/dL — ABNORMAL HIGH (ref 70–99)
Glucose-Capillary: 365 mg/dL — ABNORMAL HIGH (ref 70–99)
Glucose-Capillary: 425 mg/dL — ABNORMAL HIGH (ref 70–99)
Glucose-Capillary: 269 mg/dL — ABNORMAL HIGH (ref 70–99)
Glucose-Capillary: 271 mg/dL — ABNORMAL HIGH (ref 70–99)

## 2013-07-04 LAB — RENAL FUNCTION PANEL
Albumin: 2.9 g/dL — ABNORMAL LOW (ref 3.5–5.2)
BUN: 58 mg/dL — ABNORMAL HIGH (ref 6–23)
Calcium: 8.2 mg/dL — ABNORMAL LOW (ref 8.4–10.5)
Chloride: 85 mEq/L — ABNORMAL LOW (ref 96–112)
Creatinine, Ser: 3.57 mg/dL — ABNORMAL HIGH (ref 0.50–1.10)
GFR calc non Af Amer: 12 mL/min — ABNORMAL LOW (ref >90)
Phosphorus: 4.1 mg/dL (ref 2.3–4.6)
Potassium: 3.6 mEq/L (ref 3.5–5.1)

## 2013-07-04 LAB — GI PATHOGEN PANEL BY PCR, STOOL
C difficile toxin A/B: POSITIVE
Campylobacter by PCR: NEGATIVE
Cryptosporidium by PCR: NEGATIVE
E coli (ETEC) LT/ST: NEGATIVE
E coli 0157 by PCR: NEGATIVE
G lamblia by PCR: NEGATIVE
Rotavirus A by PCR: NEGATIVE
Shigella by PCR: NEGATIVE

## 2013-07-04 MED ORDER — INSULIN ASPART 100 UNIT/ML ~~LOC~~ SOLN
15.0000 [IU] | Freq: Once | SUBCUTANEOUS | Status: AC
  Administered 2013-07-04: 08:00:00 15 [IU] via SUBCUTANEOUS

## 2013-07-04 MED ORDER — INSULIN ASPART 100 UNIT/ML ~~LOC~~ SOLN
10.0000 [IU] | Freq: Once | SUBCUTANEOUS | Status: AC
  Administered 2013-07-04: 12:00:00 10 [IU] via SUBCUTANEOUS

## 2013-07-04 MED ORDER — ONDANSETRON HCL 4 MG/2ML IJ SOLN
4.0000 mg | Freq: Once | INTRAMUSCULAR | Status: AC
  Administered 2013-07-04: 11:00:00 4 mg via INTRAVENOUS

## 2013-07-04 MED ORDER — INSULIN DETEMIR 100 UNIT/ML ~~LOC~~ SOLN
10.0000 [IU] | Freq: Two times a day (BID) | SUBCUTANEOUS | Status: DC
  Administered 2013-07-04 (×2): 10 [IU] via SUBCUTANEOUS
  Filled 2013-07-04 (×3): qty 0.1

## 2013-07-04 MED ORDER — METRONIDAZOLE IN NACL 5-0.79 MG/ML-% IV SOLN
500.0000 mg | Freq: Three times a day (TID) | INTRAVENOUS | Status: DC
  Administered 2013-07-04 – 2013-07-10 (×19): 500 mg via INTRAVENOUS
  Filled 2013-07-04 (×20): qty 100

## 2013-07-04 NOTE — Progress Notes (Addendum)
CSW spoke with patient's daughter, Shawna Orleans (cell#: [phone]) re: SNF bed offers - daughter reinforced that she does not want her to return to [facility]. Daughter expressed interest in [facility]. CSW awaiting call back from [place] @ [facility] re: bed offers.  Clinical Social Work Department CLINICAL SOCIAL WORK PLACEMENT NOTE 07/04/2013  Patient:  Regina [name], Regina [name]  Account Number:  [id number] Admit date:  07/01/2013  Clinical Social Worker:  Orpah Greek  Date/time:  07/03/2013 03:10 PM  Clinical Social Work is seeking post-discharge placement for this patient at the following level of care:   SKILLED NURSING   (*CSW will update this form in Epic as items are completed)   07/03/2013  Patient/family provided with [facility] Department of Clinical Social Work's list of facilities offering this level of care within the geographic area requested by the patient (or if unable, by the patient's family).  07/03/2013  Patient/family informed of their freedom to choose among providers that offer the needed level of care, that participate in Medicare, Medicaid or managed care program needed by the patient, have an available bed and are willing to accept the patient.  07/03/2013  Patient/family informed of [facility]' ownership interest in [facility], as well as of the fact that they are under no obligation to receive care at this facility.  PASARR submitted to EDS on 07/03/2013 PASARR number received from EDS on 07/03/2013  FL2 transmitted to all facilities in geographic area requested by pt/family on  07/03/2013 FL2 transmitted to all facilities within larger geographic area on   Patient informed that his/her managed care company has contracts with or will negotiate with  certain facilities, including the following:     Patient/family informed of bed offers received:  07/04/2013 Patient chooses bed at [facility] 07/22/13- Jetta Lout, [facility]   Physician recommends and patient chooses bed at    Patient to be transferred to [facility] on 07/22/13- Jetta Lout, LCSWA   Patient to be transferred to facility by [facility] (non-emergency EMS)- Jetta Lout, LCSWA   The following physician request were entered in Epic:   Additional Comments:   Unice Armon Orvis, LCSW [facility] Clinical Social Worker cell #: [phone]

## 2013-07-04 NOTE — Progress Notes (Signed)
Lab. notified the RN that the patient was positive for C. Difficile.  The PCP on call was notified.

## 2013-07-04 NOTE — Progress Notes (Signed)
Patient c/o nausea and vomited. CBG rechecked and is 415. MD made aware of the situation.  Orders given. Will continue to monitor the patient closely and recheck CBG.

## 2013-07-04 NOTE — Progress Notes (Signed)
Inpatient Diabetes Program Recommendations  AACE/ADA: New Consensus Statement on Inpatient Glycemic Control (2013)  Target Ranges:  Prepandial:   less than 140 mg/dL      Peak postprandial:   less than 180 mg/dL (1-2 hours)      Critically ill patients:  140 - 180 mg/dL   Reason for Visit: Hyperglycemia  Pt nauseated and vomited this am.    Results for [name], [name] (MRN [id number]) as of 07/04/2013 10:42  Ref. Range 07/03/2013 08:19 07/03/2013 12:04 07/03/2013 17:01 07/03/2013 21:54 07/04/2013 07:48  Glucose-Capillary Latest Range: 70-99 mg/dL 629 (H) 528 (H) 413 (H) 332 (H) 530 (H)  Results for [name], [name] (MRN [id number]) as of 07/04/2013 10:42  Ref. Range 07/04/2013 08:10  Sodium Latest Range: 135-145 mEq/L 139  Potassium Latest Range: 3.5-5.1 mEq/L 3.6  Chloride Latest Range: 96-112 mEq/L 85 (L)  CO2 Latest Range: 19-32 mEq/L 17 (L)  BUN Latest Range: 6-23 mg/dL 58 (H)  Creatinine Latest Range: 0.50-1.10 mg/dL 5.36 (H)  Calcium Latest Range: 8.4-10.5 mg/dL 8.2 (L)  GFR calc non Af Amer Latest Range: >90 mL/min 12 (L)  GFR calc Af Amer Latest Range: >90 mL/min 14 (L)  Glucose Latest Range: 70-99 mg/dL 644 (H)   Uncontrolled blood sugars. Recommend: Consider starting IV Insulin/GlucoStabilizer for blood sugar control.  Will follow. Thank you. Ailene Ards, RD, LDN, CDE Inpatient Diabetes Coordinator [phone]

## 2013-07-04 NOTE — Progress Notes (Addendum)
[facility] PROGRESS NOTE  Regina [name] QMV:[id number] DOB: Feb 23, 1941 DOA: 07/01/2013 PCP: Michiel Sites, MD  Assessment/Plan: Acute renal failure, oliguric/with worsening metabolic acidosis  - Multifactorial: Dehydration, ARB, possible ATN  -continue HCO3 drip as still with metab acidosis.  - renal ultrasound with no hydronephrosis, cysts noted - Continue Foley catheter, closely monitor UOP - Continue holding ARB and metformin  -significant improvement in Cr overnight, though she continues to have uremic symptoms -appreciate renal assistance Dehydration with hyponatremia  - Multifactorial: Intermittent diarrhea and vomiting, poor oral intake and hyperglycemia on admission  - continue IV fluids as above, and monitor BMPs.  UTI  - Continue IV Rocephin pending urine culture results.  [name].DIFF diarrhea  - KUB with unremarkable bowel gas pattern - [name]. DIFF +, started on flagyl, follow Uncontrolled type II DM with hyperglycemia - Holding Metformin  - restart Levemir at lower dose  for now and continue NovoLog SSI and follow Hypertension  - Patient initially came in mildly hypotensive, possibly from dehydration.  - Improved and remains stable. Continue Holding  Exforge  - Continue Carvedilol.  Hypothyroidism  - Continue prior home medications.  History of CVA  - Continue Aggrenox.  History of anxiety and depression   Code Status: full Family Communication: niece at bedside Disposition Plan: pending clinical course   Consultants:  RENAL  Procedures:  NONE  Antibiotics:  Rocephin started 9/20  Flagyl started 9/23  HPI/Subjective: Pt remains disoriented, +nausea/vomiting  Objective: Filed Vitals:   07/04/13 0457  BP: 131/51  Pulse: 80  Temp: 98.5 F (36.9 [name])  Resp: 18    Intake/Output Summary (Last 24 hours) at 07/04/13 0956 Last data filed at 07/04/13 0458  Gross per 24 hour  Intake 1366.67 ml  Output   5150 ml  Net -3783.33 ml   Filed  Weights   07/01/13 1440 07/03/13 0600 07/04/13 0457  Weight: 99.8 kg (220 lb 0.3 oz) 106.142 kg (234 lb) 104.282 kg (229 lb 14.4 oz)    Exam:  General: somnolent oriented x 1 In NAD Cardiovascular: RRR, nl S1 s2 Respiratory: CTAB Abdomen: soft +BS NT/ND, no masses palpable Extremities: No cyanosis and trace bil hand edema    Data Reviewed: Basic Metabolic Panel:  Recent Labs Lab 07/01/13 1134 07/01/13 2000 07/02/13 0545 07/03/13 0535 07/04/13 0810  NA 127* 132* 131* 134* 139  K 4.6 4.0 4.2 3.6 3.6  CL 95* 100 103 94* 85*  CO2 15* 17* 12* 17* 17*  GLUCOSE 192* 116* 37* 306* 452*  BUN 87* 86* 84* 78* 58*  CREATININE 7.83* 8.17* 7.93* 6.81* 3.57*  CALCIUM 9.5 8.6 8.6 7.8* 8.2*  PHOS  --   --   --  4.6 4.1   Liver Function Tests:  Recent Labs Lab 07/01/13 1134 07/03/13 0535 07/04/13 0810  AST 14  --   --   ALT 12  --   --   ALKPHOS 66  --   --   BILITOT 0.4  --   --   PROT 6.5  --   --   ALBUMIN 3.2* 2.4* 2.9*   No results found for this basename: LIPASE, AMYLASE,  in the last 168 hours No results found for this basename: AMMONIA,  in the last 168 hours CBC:  Recent Labs Lab 07/01/13 1134 07/02/13 0545 07/03/13 0535  WBC 7.1 7.5 4.9  NEUTROABS 4.7  --   --   HGB 12.4 11.4* 10.4*  HCT 35.3* 31.8* 29.7*  MCV 88.5 88.6 87.6  PLT 167 200 175   Cardiac Enzymes:  Recent Labs Lab 07/01/13 1134  CKTOTAL 38  TROPONINI <0.30   BNP (last 3 results) No results found for this basename: PROBNP,  in the last 8760 hours CBG:  Recent Labs Lab 07/03/13 0819 07/03/13 1204 07/03/13 1701 07/03/13 2154 07/04/13 0748  GLUCAP 291* 304* 241* 332* 530*    Recent Results (from the past 240 hour(s))  URINE CULTURE     Status: None   Collection Time    07/01/13 12:37 PM      Result Value Range Status   Specimen Description URINE, CATHETERIZED   Final   Special Requests NONE   Final   Culture  Setup Time     Final   Value: 07/01/2013 20:06     Performed at  [facility] Count     Final   Value: >=100,000 COLONIES/ML     Performed at [facility]   Culture     Final   Value: ESCHERICHIA COLI     Performed at [facility]   Report Status 07/03/2013 FINAL   Final   Organism ID, Bacteria ESCHERICHIA COLI   Final     Studies: No results found.  Scheduled Meds: . carvedilol  6.25 mg Oral BID WC  . cefTRIAXone (ROCEPHIN)  IV  1 g Intravenous Q24H  . dipyridamole-aspirin  1 capsule Oral BID  . enoxaparin (LOVENOX) injection  30 mg Subcutaneous Q24H  . ezetimibe  10 mg Oral Daily  . famotidine  20 mg Oral Daily  . feeding supplement  237 mL Oral BID BM  . insulin aspart  0-5 Units Subcutaneous QHS  . insulin aspart  0-9 Units Subcutaneous TID WC  . insulin detemir  10 Units Subcutaneous BID  . latanoprost  1 drop Both Eyes QHS  . levothyroxine  75 mcg Oral QAC breakfast  . liothyronine  25 mcg Oral BID  . metronidazole  500 mg Intravenous Q8H  . multivitamin with minerals  1 tablet Oral Daily  . simvastatin  20 mg Oral QPM  . sodium chloride  3 mL Intravenous Q12H   Continuous Infusions: .  sodium bicarbonate 150 mEq in sterile water 1000 mL infusion 125 mL/hr at 07/04/13 0315    Principal Problem:   Acute renal failure Active Problems:   HYPOTHYROIDISM   DIABETES MELLITUS, TYPE II   HYPERLIPIDEMIA   UTI (urinary tract infection)   HTN (hypertension)   Anxiety and depression   CVA (cerebral infarction)   Weakness generalized   Dehydration   Failure to thrive   Diarrhea   Hyponatremia   Metabolic acidosis    Time spent: 35    Regina [name]  [facility] Pager [phone]. If 7PM-7AM, please contact night-coverage at [email or web address], password [facility] 07/04/2013, 9:56 AM  LOS: 3 days

## 2013-07-04 NOTE — Progress Notes (Signed)
CBG rechecked: 410. Will recheck in another hour and continue to monitor the patient.

## 2013-07-04 NOTE — Progress Notes (Signed)
CBG down to 269. Stanton Kidney R

## 2013-07-04 NOTE — Clinical Documentation Improvement (Signed)
THIS DOCUMENT IS NOT A PERMANENT PART OF THE MEDICAL RECORD  Please update your documentation with the medical record to reflect your response to this query. If you need help knowing how to do this please call [phone].  07/04/13   Dear Dr.[facility],  In a better effort to capture your patient's severity of illness, reflect appropriate length of stay and utilization of resources, a review of the patient medical record has revealed the following indicators.    Based on your clinical judgment, please clarify and document in a progress note and/or discharge summary the clinical condition associated with the following supporting information:  In responding to this query please exercise your independent judgment.  The fact that a query is asked, does not imply that any particular answer is desired or expected.  07/03/13 Nutrition eval noted w/ nutrition criteria for dx " -Severe malnutrition in the context of chronic illness -Morbid Obesity". For accurate Dx specificity & severity please help validate nutrition dx for cond being eval'd, mon'd & tx'd. Thank you   Possible Clinical Conditions? Mild Malnutrition  Moderate Malnutrition Severe Malnutrition   Protein Calorie Malnutrition Severe Protein Calorie Malnutrition Emaciation  Cachexia   Other Condition Cannot clinically determine    Supporting Information: Risk Factors: See Nutrition Eval 07/03/13  Signs & Symptoms: See Nutrition Eval 07/03/13  Diagnostics: See Nutrition Eval 07/03/13  Treatment  See Nutrition Eval 07/03/13  Nutrition Consult See Nutrition Eval 07/03/13  You may use possible, probable, or suspect with inpatient documentation. possible, probable, suspected diagnoses MUST be documented at the time of discharge  Reviewed: additional documentation in the MEDICAL RECORD NUMBER09/29/14: agreed & doc by dr d thompson> closed. orm  Thank You,  Toribio Harbour, RN, BSN, CCDS Certified Clinical  Documentation Specialist Pager: [phone] [facility] Information Management [place]

## 2013-07-04 NOTE — Progress Notes (Signed)
CBG 530. MD made aware and orders given. Diabetes RN also aware.  Will continue to monitor patient closely. Stanton Kidney R

## 2013-07-04 NOTE — Progress Notes (Signed)
Renal Service Daily Progress Note [facility]  Subjective: still confused, over 5L UOP yest and creat down to 3.6, a significant improvement  Physical Exam:  Blood pressure 144/56, pulse 86, temperature 98.5 F (36.9 C), temperature source Oral, resp. rate 18, height 5\' 2"  (1.575 m), weight 104.282 kg (229 lb 14.4 oz), SpO2 95.00%. Gen: elderly WF , confused, no distress, mild asterixis, pleasant Skin: no rash, cyanosis HEENT:  EOMI, sclera anicteric, throat dry Neck: no JVD, no LAN Chest: clear to bases bilat CV: regular, harsh 2-3/6 SEM >> to the L axilla  Abdomen: soft, obese, nontender, no mass or HSM Ext: no LE edema , no joint effusion, no gangrene/ulcers Neuro: alert, "hospital" "1971"  Impression: 1. AKI due to vol depletion / ARB / hypotension- resolving, creat down 3.6 2. AMS- due to uremia, acute illness; still confused, reviewed meds, none implicated 3. Diarrhea- per primary 4. Met acidosis- cont bicarb gtt 5. DM - per primary service; no metformin in the setting of AKI 6. EColi UTI- may have been precipitating event 7. Hypotension- improved, BP normal today, ok for lowdose coreg, holding ARB 8. H/O CVA - on aggrenox  Rec: cont IVF's (bicarb drip), dec to 100 cc/hr    Vinson Moselle  MD Pager [phone]    Cell  [phone] 07/04/2013, 8:13 PM    Recent Labs Lab 07/02/13 0545 07/03/13 0535 07/04/13 0810  NA 131* 134* 139  K 4.2 3.6 3.6  CL 103 94* 85*  CO2 12* 17* 17*  GLUCOSE 37* 306* 452*  BUN 84* 78* 58*  CREATININE 7.93* 6.81* 3.57*  CALCIUM 8.6 7.8* 8.2*  PHOS  --  4.6 4.1    Recent Labs Lab 07/01/13 1134 07/03/13 0535 07/04/13 0810  AST 14  --   --   ALT 12  --   --   ALKPHOS 66  --   --   BILITOT 0.4  --   --   PROT 6.5  --   --   ALBUMIN 3.2* 2.4* 2.9*    Recent Labs Lab 07/01/13 1134 07/02/13 0545 07/03/13 0535  WBC 7.1 7.5 4.9  NEUTROABS 4.7  --   --   HGB 12.4 11.4* 10.4*  HCT 35.3* 31.8* 29.7*  MCV 88.5  88.6 87.6  PLT 167 200 175   . carvedilol  6.25 mg Oral BID WC  . cefTRIAXone (ROCEPHIN)  IV  1 g Intravenous Q24H  . dipyridamole-aspirin  1 capsule Oral BID  . enoxaparin (LOVENOX) injection  30 mg Subcutaneous Q24H  . ezetimibe  10 mg Oral Daily  . famotidine  20 mg Oral Daily  . feeding supplement  237 mL Oral BID BM  . insulin aspart  0-5 Units Subcutaneous QHS  . insulin aspart  0-9 Units Subcutaneous TID WC  . insulin detemir  10 Units Subcutaneous BID  . latanoprost  1 drop Both Eyes QHS  . levothyroxine  75 mcg Oral QAC breakfast  . liothyronine  25 mcg Oral BID  . metronidazole  500 mg Intravenous Q8H  . multivitamin with minerals  1 tablet Oral Daily  . simvastatin  20 mg Oral QPM  . sodium chloride  3 mL Intravenous Q12H   .  sodium bicarbonate 150 mEq in sterile water 1000 mL infusion 125 mL/hr at 07/04/13 1453   acetaminophen, acetaminophen, albuterol, ondansetron (ZOFRAN) IV, ondansetron

## 2013-07-05 ENCOUNTER — Encounter (HOSPITAL_COMMUNITY): Payer: Self-pay | Admitting: Radiology

## 2013-07-05 ENCOUNTER — Inpatient Hospital Stay (HOSPITAL_COMMUNITY): Payer: Medicare Other

## 2013-07-05 DIAGNOSIS — A0472 Enterocolitis due to Clostridium difficile, not specified as recurrent: Secondary | ICD-10-CM | POA: Diagnosis present

## 2013-07-05 LAB — MAGNESIUM: Magnesium: 1.3 mg/dL — ABNORMAL LOW (ref 1.5–2.5)

## 2013-07-05 LAB — RENAL FUNCTION PANEL
Albumin: 2.9 g/dL — ABNORMAL LOW (ref 3.5–5.2)
BUN: 36 mg/dL — ABNORMAL HIGH (ref 6–23)
CO2: 29 mEq/L (ref 19–32)
Calcium: 7.8 mg/dL — ABNORMAL LOW (ref 8.4–10.5)
GFR calc Af Amer: 28 mL/min — ABNORMAL LOW (ref >90)
GFR calc non Af Amer: 24 mL/min — ABNORMAL LOW (ref >90)
Glucose, Bld: 294 mg/dL — ABNORMAL HIGH (ref 70–99)
Phosphorus: 3 mg/dL (ref 2.3–4.6)
Potassium: 2.6 mEq/L — CL (ref 3.5–5.1)
Sodium: 152 mEq/L — ABNORMAL HIGH (ref 135–145)

## 2013-07-05 LAB — BLOOD GAS, ARTERIAL
Bicarbonate: 29.5 mEq/L — ABNORMAL HIGH (ref 20.0–24.0)
O2 Saturation: 94.8 %
Patient temperature: 98.6
TCO2: 25.6 mmol/L (ref 0–100)
pH, Arterial: 7.487 — ABNORMAL HIGH (ref 7.350–7.450)
pO2, Arterial: 69 mmHg — ABNORMAL LOW (ref 80.0–100.0)

## 2013-07-05 LAB — BASIC METABOLIC PANEL
BUN: 32 mg/dL — ABNORMAL HIGH (ref 6–23)
CO2: 27 mEq/L (ref 19–32)
Calcium: 7.8 mg/dL — ABNORMAL LOW (ref 8.4–10.5)
Chloride: 92 mEq/L — ABNORMAL LOW (ref 96–112)
Creatinine, Ser: 1.65 mg/dL — ABNORMAL HIGH (ref 0.50–1.10)
GFR calc non Af Amer: 30 mL/min — ABNORMAL LOW (ref >90)
Glucose, Bld: 328 mg/dL — ABNORMAL HIGH (ref 70–99)

## 2013-07-05 LAB — CBC
HCT: 34.7 % — ABNORMAL LOW (ref 36.0–46.0)
Hemoglobin: 12.1 g/dL (ref 12.0–15.0)
MCHC: 34.9 g/dL (ref 30.0–36.0)
MCV: 88.7 fL (ref 78.0–100.0)
RDW: 14.9 % (ref 11.5–15.5)
WBC: 4.4 10*3/uL (ref 4.0–10.5)

## 2013-07-05 LAB — VITAMIN B12: Vitamin B-12: 164 pg/mL — ABNORMAL LOW (ref 211–911)

## 2013-07-05 LAB — GLUCOSE, CAPILLARY
Glucose-Capillary: 285 mg/dL — ABNORMAL HIGH (ref 70–99)
Glucose-Capillary: 344 mg/dL — ABNORMAL HIGH (ref 70–99)
Glucose-Capillary: 346 mg/dL — ABNORMAL HIGH (ref 70–99)

## 2013-07-05 MED ORDER — METOPROLOL TARTRATE 1 MG/ML IV SOLN: 2.5000 mg | Freq: Three times a day (TID) | INTRAVENOUS | Status: DC

## 2013-07-05 MED ORDER — POTASSIUM CHLORIDE CRYS ER 20 MEQ PO TBCR: 40.0000 meq | EXTENDED_RELEASE_TABLET | Freq: Once | ORAL | Status: DC

## 2013-07-05 MED ORDER — INSULIN DETEMIR 100 UNIT/ML ~~LOC~~ SOLN
20.0000 [IU] | Freq: Two times a day (BID) | SUBCUTANEOUS | Status: DC
  Administered 2013-07-05 – 2013-07-06 (×2): 20 [IU] via SUBCUTANEOUS
  Filled 2013-07-05 (×3): qty 0.2

## 2013-07-05 MED ORDER — POTASSIUM CHLORIDE 10 MEQ/100ML IV SOLN: 10.0000 meq | INTRAVENOUS | Status: DC

## 2013-07-05 MED ORDER — POTASSIUM CL IN DEXTROSE 5% 20 MEQ/L IV SOLN
20.0000 meq | INTRAVENOUS | Status: DC
  Administered 2013-07-05: 12:00:00 20 meq via INTRAVENOUS
  Filled 2013-07-05 (×3): qty 1000

## 2013-07-05 MED ORDER — HYDRALAZINE HCL 20 MG/ML IJ SOLN
5.0000 mg | Freq: Once | INTRAMUSCULAR | Status: AC
  Administered 2013-07-06: 01:00:00 5 mg via INTRAVENOUS
  Filled 2013-07-05: qty 1

## 2013-07-05 MED ORDER — HYDRALAZINE HCL 20 MG/ML IJ SOLN
10.0000 mg | Freq: Once | INTRAMUSCULAR | Status: AC
  Administered 2013-07-05: 10 mg via INTRAVENOUS
  Filled 2013-07-05: qty 1

## 2013-07-05 MED ORDER — PROMETHAZINE HCL 25 MG/ML IJ SOLN
12.5000 mg | Freq: Four times a day (QID) | INTRAMUSCULAR | Status: DC | PRN
  Administered 2013-07-05: 25 mg via INTRAVENOUS
  Administered 2013-07-06: 12.5 mg via INTRAVENOUS
  Filled 2013-07-05 (×2): qty 1

## 2013-07-05 MED ORDER — METOPROLOL TARTRATE 1 MG/ML IV SOLN
5.0000 mg | Freq: Three times a day (TID) | INTRAVENOUS | Status: DC
  Administered 2013-07-05 – 2013-07-07 (×7): 5 mg via INTRAVENOUS
  Filled 2013-07-05 (×12): qty 5

## 2013-07-05 MED ORDER — POTASSIUM CHLORIDE 10 MEQ/100ML IV SOLN
10.0000 meq | INTRAVENOUS | Status: AC
  Administered 2013-07-05 (×6): 10 meq via INTRAVENOUS
  Filled 2013-07-05 (×6): qty 100

## 2013-07-05 MED ORDER — MAGNESIUM SULFATE 40 MG/ML IJ SOLN
4.0000 g | Freq: Once | INTRAMUSCULAR | Status: AC
  Administered 2013-07-05: 17:00:00 4 g via INTRAVENOUS
  Filled 2013-07-05 (×2): qty 100

## 2013-07-05 MED ORDER — INSULIN ASPART 100 UNIT/ML ~~LOC~~ SOLN
0.0000 [IU] | Freq: Three times a day (TID) | SUBCUTANEOUS | Status: DC
  Administered 2013-07-05: 11 [IU] via SUBCUTANEOUS
  Administered 2013-07-06: 18 [IU] via SUBCUTANEOUS

## 2013-07-05 NOTE — Progress Notes (Signed)
Pt nauseated during the shift--MD on call notified and order received and adminstered. Pt had restless night. Will cont to monitor. SRP, RN

## 2013-07-05 NOTE — Progress Notes (Addendum)
NUTRITION FOLLOW-UP  DOCUMENTATION CODES Per approved criteria  -Severe malnutrition in the context of chronic illness -Morbid Obesity   INTERVENTION: Recommend removal of renal restrictions of diet to offer more po variety and given current phos WNL and low potassium. Paged Dr. Arlean Hopping. Continue Glucerna Shake po BID, each supplement provides 220 kcal and 10 grams of protein. If oral intake does not improve, recommend nutrition support in setting of ongoing malnutrition. RD to continue to follow nutrition care plan.  NUTRITION DIAGNOSIS: Inadequate oral intake related to poor appetite as evidenced by ongoing weight loss and poor meal completion. Ongoing.  Goal: Intake to meet >90% of estimated nutrition needs.  Monitor:  weight trends, lab trends, I/O's, PO intake, supplement tolerance  ASSESSMENT: PMHx significant for DM2, retinopathy, legal blindness, HTN, CVA, hypothyroidism, anxiety and depression. Noted pt with 2 recent admissions to [facility] within the past month. Pt was d/c'd to rehab for about 3 weeks after hospitalization. Pt developed diarrhea, poor appetite, and overall decline x 9 days at [facility]. Family thought decline was 2/2 lack of care at SNF, pt was then sent home. Remains weak and bed bound. Reported weight loss of 25 lb x 1 month.  Work-up reveals acute renal failure. Being followed by renal MD; pt with 5L UOP yesterday.   Pt with 5.4% wt loss x 1 month per EPIC weight hx. Pt meets criteria for severe MALNUTRITION in the context of chronic illness as evidenced by intake of <75% x at least 1 month and wt loss of >5% x 1 month.  Potassium is currently low and phosphorus is WNL. Blood sugars are elevated, ranging from 269-285.  Sodium is trending up, currently 152.  Continues with very minimal intake of Renal diet, discussed during rounds - pt has not eaten in 2 days, today is day 3. Ordered for Glucerna Shakes PO BID.  Height: Ht Readings from Last 1 Encounters:   07/01/13 5\' 2"  (1.575 m)    Weight: Wt Readings from Last 1 Encounters:  07/05/13 214 lb 12.8 oz (97.433 kg)  Admit wt 220 lb wt decreasing with fluid status and poor oral intake  BMI:  Body mass index is 39.28 kg/(m^2). Obese Class II  Estimated Nutritional Needs: Kcal: 1500 - 1700 Protein: 95 - 105 g Fluid: 1.2 liters  Skin: intact  Diet Order: Renal 60/70; Carbohydrate Modified Medium diet; 1200 ml fluid restriction  EDUCATION NEEDS: -No education needs identified at this time   Intake/Output Summary (Last 24 hours) at 07/05/13 0912 Last data filed at 07/05/13 0700  Gross per 24 hour  Intake 3475.84 ml  Output   7600 ml  Net -4124.16 ml    Last BM: 9/23  Labs:   Recent Labs Lab 07/03/13 0535 07/04/13 0810 07/05/13 0420  NA 134* 139 152*  K 3.6 3.6 2.6*  CL 94* 85* 95*  CO2 17* 17* 29  BUN 78* 58* 36*  CREATININE 6.81* 3.57* 2.01*  CALCIUM 7.8* 8.2* 7.8*  PHOS 4.6 4.1 3.0  GLUCOSE 306* 452* 294*    CBG (last 3)   Recent Labs  07/04/13 1641 07/04/13 2113 07/05/13 0732  GLUCAP 269* 271* 285*    Scheduled Meds: . cefTRIAXone (ROCEPHIN)  IV  1 g Intravenous Q24H  . dipyridamole-aspirin  1 capsule Oral BID  . enoxaparin (LOVENOX) injection  30 mg Subcutaneous Q24H  . ezetimibe  10 mg Oral Daily  . famotidine  20 mg Oral Daily  . feeding supplement  237 mL Oral BID BM  .  insulin aspart  0-5 Units Subcutaneous QHS  . insulin aspart  0-9 Units Subcutaneous TID WC  . insulin detemir  10 Units Subcutaneous BID  . latanoprost  1 drop Both Eyes QHS  . levothyroxine  75 mcg Oral QAC breakfast  . liothyronine  25 mcg Oral BID  . metoprolol  5 mg Intravenous Q8H  . metronidazole  500 mg Intravenous Q8H  . multivitamin with minerals  1 tablet Oral Daily  . potassium chloride  10 mEq Intravenous Q1 Hr x 6  . simvastatin  20 mg Oral QPM  . sodium chloride  3 mL Intravenous Q12H    Continuous Infusions: . dextrose 5 % with KCl 20 mEq / L       Jarold Motto MS, RD, LDN Pager: [phone] After-hours pager: [phone]

## 2013-07-05 NOTE — Progress Notes (Signed)
OT Cancellation Note  Patient Details Name: Regina [name] MRN: [id number] DOB: 1941-05-29   Cancelled Treatment:    Reason Eval/Treat Not Completed: Medical issues which prohibited therapy. Per nursing pt with nausea and low potassium and requests to hold therapy. Will try back at a different time.  [name] [phone] 07/05/2013, 12:20 PM

## 2013-07-05 NOTE — Progress Notes (Signed)
Lab results texted to MD--BP elevated this am also texted to MD, await followup. SP, RN

## 2013-07-05 NOTE — Progress Notes (Signed)
[facility] PROGRESS NOTE  Regina [name] UJW:[id number] DOB: [birth year] DOA: 07/01/2013 PCP: Michiel Sites, MD  Assessment/Plan: #1 acute renal failure Likely secondary to volume depletion and hypotension in the setting of ARB. Clinical improvement. Continue Foley catheter. Bicarbonate drip has been discontinued as acidosis has resolved. Continue to hold ARB and metformin. Nephrology following and appreciate input and recommendations.  #2 acute encephalopathy Questionable etiology. On admission it was felt maybe likely secondary to uremia. However with improvement in patient's renal function no significant improvement in patient's mental status. Maybe a secondary metabolic encephalopathy in the setting of the C. difficile colitis and urinary tract infection in the setting of acute renal failure. CT of the head was negative. Will check MRI of the head. Check EEG. Check a B12, RPR, RBC folate. Will discontinue Rocephin as patient has received 3 days of antibiotics. If no significant improvement will consult with neurology tomorrow.  #3 C. difficile colitis On IV Flagyl. Follow.  #4 hypernatremia IV fluids have been changed to D5W. Follow.  #5 Escherichia coli UTI Patient has received IV Rocephin x3 days. DC IV antibiotics of Rocephin.  #6 hypertension Continue IV Lopressor.  #7 history of CVA Continue Aggrenox.  #8 diabetes mellitus Hemoglobin A1c is 9.6. CBGs have ranged from 271- 332. Increase Levemir to 20 units subcutaneous twice a day. Continue sliding scale insulin.  #9 hypothyroidism Continue Synthroid and Cytomel.  #10 anxiety/depression Stable.  Code Status: Full Family Communication: Updated niece at bedside. Disposition Plan: To skilled nursing facility versus home when medically stable.   Consultants:  Nephrology: Dr. Eliott Nine 07/02/2013:  Procedures:  CT head 07/05/2013  KUB 07/05/2013  KUB 07/01/2013  Chest x-ray  07/01/2013  Antibiotics:  IV Rocephin 07/01/2013--> 07/05/2013  IV Flagyl 07/04/2013-  HPI/Subjective: Patient is lethargic. Patient minimally responsive to verbal and noxious stimuli.  Objective: Filed Vitals:   07/05/13 1100  BP: 171/73  Pulse:   Temp:   Resp:     Intake/Output Summary (Last 24 hours) at 07/05/13 1359 Last data filed at 07/05/13 0700  Gross per 24 hour  Intake 3365.84 ml  Output   7600 ml  Net -4234.16 ml   Filed Weights   07/03/13 0600 07/04/13 0457 07/05/13 0520  Weight: 106.142 kg (234 lb) 104.282 kg (229 lb 14.4 oz) 97.433 kg (214 lb 12.8 oz)    Exam:   General:  Lehtargic. Minimally responsive  Cardiovascular: RRR  Respiratory: CTAB anterior lung fields.  Abdomen: Soft/NT/ND/+BS  Musculoskeletal: Moving extremities spontaneously.  Data Reviewed: Basic Metabolic Panel:  Recent Labs Lab 07/02/13 0545 07/03/13 0535 07/04/13 0810 07/05/13 0420 07/05/13 0820  NA 131* 134* 139 152* 147*  K 4.2 3.6 3.6 2.6* 2.7*  CL 103 94* 85* 95* 92*  CO2 12* 17* 17* 29 27  GLUCOSE 37* 306* 452* 294* 328*  BUN 84* 78* 58* 36* 32*  CREATININE 7.93* 6.81* 3.57* 2.01* 1.65*  CALCIUM 8.6 7.8* 8.2* 7.8* 7.8*  MG  --   --   --   --  1.3*  PHOS  --  4.6 4.1 3.0  --    Liver Function Tests:  Recent Labs Lab 07/01/13 1134 07/03/13 0535 07/04/13 0810 07/05/13 0420  AST 14  --   --   --   ALT 12  --   --   --   ALKPHOS 66  --   --   --   BILITOT 0.4  --   --   --   PROT 6.5  --   --   --  ALBUMIN 3.2* 2.4* 2.9* 2.9*   No results found for this basename: LIPASE, AMYLASE,  in the last 168 hours No results found for this basename: AMMONIA,  in the last 168 hours CBC:  Recent Labs Lab 07/01/13 1134 07/02/13 0545 07/03/13 0535 07/05/13 0820  WBC 7.1 7.5 4.9 4.4  NEUTROABS 4.7  --   --   --   HGB 12.4 11.4* 10.4* 12.1  HCT 35.3* 31.8* 29.7* 34.7*  MCV 88.5 88.6 87.6 88.7  PLT 167 200 175 221   Cardiac Enzymes:  Recent Labs Lab  07/01/13 1134  CKTOTAL 38  TROPONINI <0.30   BNP (last 3 results) No results found for this basename: PROBNP,  in the last 8760 hours CBG:  Recent Labs Lab 07/04/13 1329 07/04/13 1641 07/04/13 2113 07/05/13 0732 07/05/13 1150  GLUCAP 365* 269* 271* 285* 332*    Recent Results (from the past 240 hour(s))  URINE CULTURE     Status: None   Collection Time    07/01/13 12:37 PM      Result Value Range Status   Specimen Description URINE, CATHETERIZED   Final   Special Requests NONE   Final   Culture  Setup Time     Final   Value: 07/01/2013 20:06     Performed at [facility] Count     Final   Value: >=100,000 COLONIES/ML     Performed at [facility]   Culture     Final   Value: ESCHERICHIA COLI     Performed at [facility]   Report Status 07/03/2013 FINAL   Final   Organism ID, Bacteria ESCHERICHIA COLI   Final     Studies: Dg Abd 1 View  07/05/2013   CLINICAL DATA:  Nausea and vomiting  EXAM: ABDOMEN - 1 VIEW  COMPARISON:  July 01, 2013  FINDINGS: Normal bowel gas pattern. Negative for bowel obstruction. Lumbar degenerative changes. No acute bony abnormality. Degenerative change in the left hip joint.  IMPRESSION: Nonobstructive bowel gas pattern.   Electronically Signed   By: Marlan Palau M.D.   On: 07/05/2013 09:58    Scheduled Meds: . cefTRIAXone (ROCEPHIN)  IV  1 g Intravenous Q24H  . dipyridamole-aspirin  1 capsule Oral BID  . enoxaparin (LOVENOX) injection  30 mg Subcutaneous Q24H  . ezetimibe  10 mg Oral Daily  . famotidine  20 mg Oral Daily  . feeding supplement  237 mL Oral BID BM  . insulin aspart  0-15 Units Subcutaneous TID WC  . insulin detemir  20 Units Subcutaneous BID  . latanoprost  1 drop Both Eyes QHS  . levothyroxine  75 mcg Oral QAC breakfast  . liothyronine  25 mcg Oral BID  . magnesium sulfate 1 - 4 g bolus IVPB  4 g Intravenous Once  . metoprolol  5 mg Intravenous Q8H  . metronidazole  500 mg  Intravenous Q8H  . multivitamin with minerals  1 tablet Oral Daily  . simvastatin  20 mg Oral QPM  . sodium chloride  3 mL Intravenous Q12H   Continuous Infusions: . dextrose 5 % with KCl 20 mEq / L 20 mEq (07/05/13 1133)    Principal Problem:   Acute renal failure Active Problems:   HYPOTHYROIDISM   DIABETES MELLITUS, TYPE II   HYPERLIPIDEMIA   UTI (urinary tract infection)   HTN (hypertension)   Anxiety and depression   CVA (cerebral infarction)   Weakness generalized   Dehydration  Failure to thrive   Diarrhea   Hyponatremia   Metabolic acidosis   Clostridium difficile colitis   Hypomagnesemia    Time spent: > 35 mins    [facility]  [facility] Pager [phone]. If 7PM-7AM, please contact night-coverage at [email or web address], password [facility] 07/05/2013, 1:59 PM  LOS: 4 days

## 2013-07-05 NOTE — Progress Notes (Signed)
Pt with has elevated temperature---101.4 MD notified. SRP, RN

## 2013-07-05 NOTE — Progress Notes (Signed)
PT Cancellation Note  Patient Details Name: Regina [name] MRN: [id number] DOB: June 16, 1941   Cancelled Treatment:    Reason Eval/Treat Not Completed: Medical issues which prohibited therapy (RN requested PT hold therapy/OOB on today-nausea, low K+). Will check back another day.    Rebeca Alert, MPT Pager: [phone]

## 2013-07-05 NOTE — Progress Notes (Signed)
Orders received and meds administered. SRP, RN

## 2013-07-05 NOTE — Progress Notes (Signed)
Renal Service Daily Progress Note [facility]  Subjective: still confused, 6.5 L UOP yest, creat down to 1.6.   Physical Exam:  Blood pressure 171/73, pulse 86, temperature 98.3 F (36.8 C), temperature source Axillary, resp. rate 18, height 5\' 2"  (1.575 m), weight 97.433 kg (214 lb 12.8 oz), SpO2 98.00%. Gen: elderly WF, awake and alert but not responding Skin: no rash, cyanosis Neck: no JVD, no LAN Chest: clear to bases bilat CV: regular, harsh 2-3/6 SEM >> to the L axilla  Abdomen: soft, obese, nontender, no mass or HSM Ext: no LE edema Neuro: awake, moves arms spont, but not responding verbally today  Impression: 1. AKI due to vol depletion / ARB / hypotension- resolved 2. AMS- still not responding properly, unclear cause; she is blind, could she have acute hearing loss? She appears to be alert, just not responding. Consider nonconvulsive seizures as well, would stop Rocephin and any other nonessential meds. Renal function is close to normal and no longer suspect for causing AMS.  I have d/w Dr Janee Morn, plan is to check MRI, EEG, stop Rocephin, and consider neuro consult tomorrow if not improved. No other suggestions from renal standpoint, will sign off.  Does not need renal f/u in my opinion, but if this is desired, call [facility] at time of discharge to schedule f/u appt.  3. Diarrhea- per primary 4. Hypernatremia- switched IVF to D5W 5. DM - per primary service 6. EColi UTI- has had 4 days of IV abx, no fever or wbc on admission or now, would stop abx 7. Cdif- on IV flagyl 8. Hypertension- on tid IV metoprolol for now, agree 9. H/O CVA - on aggrenox    Regina Moselle  MD Pager [phone]    Cell  [phone] 07/05/2013, 3:58 PM    Recent Labs Lab 07/03/13 0535 07/04/13 0810 07/05/13 0420 07/05/13 0820  NA 134* 139 152* 147*  K 3.6 3.6 2.6* 2.7*  CL 94* 85* 95* 92*  CO2 17* 17* 29 27  GLUCOSE 306* 452* 294* 328*  BUN 78* 58* 36* 32*  CREATININE 6.81* 3.57*  2.01* 1.65*  CALCIUM 7.8* 8.2* 7.8* 7.8*  PHOS 4.6 4.1 3.0  --     Recent Labs Lab 07/01/13 1134 07/03/13 0535 07/04/13 0810 07/05/13 0420  AST 14  --   --   --   ALT 12  --   --   --   ALKPHOS 66  --   --   --   BILITOT 0.4  --   --   --   PROT 6.5  --   --   --   ALBUMIN 3.2* 2.4* 2.9* 2.9*    Recent Labs Lab 07/01/13 1134 07/02/13 0545 07/03/13 0535 07/05/13 0820  WBC 7.1 7.5 4.9 4.4  NEUTROABS 4.7  --   --   --   HGB 12.4 11.4* 10.4* 12.1  HCT 35.3* 31.8* 29.7* 34.7*  MCV 88.5 88.6 87.6 88.7  PLT 167 200 175 221   . dipyridamole-aspirin  1 capsule Oral BID  . enoxaparin (LOVENOX) injection  30 mg Subcutaneous Q24H  . ezetimibe  10 mg Oral Daily  . famotidine  20 mg Oral Daily  . feeding supplement  237 mL Oral BID BM  . insulin aspart  0-15 Units Subcutaneous TID WC  . insulin detemir  20 Units Subcutaneous BID  . latanoprost  1 drop Both Eyes QHS  . levothyroxine  75 mcg Oral QAC breakfast  . liothyronine  25 mcg Oral BID  . magnesium sulfate 1 - 4 g bolus IVPB  4 g Intravenous Once  . metoprolol  5 mg Intravenous Q8H  . metronidazole  500 mg Intravenous Q8H  . multivitamin with minerals  1 tablet Oral Daily  . simvastatin  20 mg Oral QPM  . sodium chloride  3 mL Intravenous Q12H   . dextrose 5 % with KCl 20 mEq / L 20 mEq (07/05/13 1133)   acetaminophen, acetaminophen, albuterol, ondansetron (ZOFRAN) IV, ondansetron, promethazine

## 2013-07-06 ENCOUNTER — Inpatient Hospital Stay (HOSPITAL_COMMUNITY): Payer: Medicare Other

## 2013-07-06 ENCOUNTER — Inpatient Hospital Stay (HOSPITAL_COMMUNITY)
Admit: 2013-07-06 | Discharge: 2013-07-06 | Disposition: A | Discharge status: Home / Self Care | Payer: Medicare Other | Attending: Internal Medicine | Admitting: Internal Medicine

## 2013-07-06 DIAGNOSIS — E870 Hyperosmolality and hypernatremia: Secondary | ICD-10-CM | POA: Diagnosis not present

## 2013-07-06 DIAGNOSIS — G039 Meningitis, unspecified: Secondary | ICD-10-CM

## 2013-07-06 DIAGNOSIS — I6350 Cerebral infarction due to unspecified occlusion or stenosis of unspecified cerebral artery: Secondary | ICD-10-CM

## 2013-07-06 DIAGNOSIS — E1139 Type 2 diabetes mellitus with other diabetic ophthalmic complication: Secondary | ICD-10-CM

## 2013-07-06 DIAGNOSIS — G9340 Encephalopathy, unspecified: Secondary | ICD-10-CM | POA: Diagnosis present

## 2013-07-06 DIAGNOSIS — R569 Unspecified convulsions: Secondary | ICD-10-CM | POA: Clinically undetermined

## 2013-07-06 DIAGNOSIS — A0472 Enterocolitis due to Clostridium difficile, not specified as recurrent: Secondary | ICD-10-CM

## 2013-07-06 LAB — URINALYSIS, ROUTINE W REFLEX MICROSCOPIC
Glucose, UA: >1000 mg/dL — AB
Ketones, ur: >80 mg/dL — AB
Nitrite: NEGATIVE
Protein, ur: 100 mg/dL — AB
pH: 5 (ref 5.0–8.0)

## 2013-07-06 LAB — CBC WITH DIFFERENTIAL/PLATELET
Basophils Absolute: 0 10*3/uL (ref 0.0–0.1)
Eosinophils Absolute: 0 10*3/uL (ref 0.0–0.7)
Eosinophils Relative: 0 % (ref 0–5)
HCT: 38.6 % (ref 36.0–46.0)
Hemoglobin: 13 g/dL (ref 12.0–15.0)
Lymphocytes Relative: 25 % (ref 12–46)
MCH: 31.3 pg (ref 26.0–34.0)
MCV: 92.8 fL (ref 78.0–100.0)
Monocytes Absolute: 0.8 10*3/uL (ref 0.1–1.0)
Neutrophils Relative %: 58 % (ref 43–77)
Platelets: 229 10*3/uL (ref 150–400)
RBC: 4.16 MIL/uL (ref 3.87–5.11)
RDW: 15.3 % (ref 11.5–15.5)
WBC: 5 10*3/uL (ref 4.0–10.5)

## 2013-07-06 LAB — LACTIC ACID, PLASMA: Lactic Acid, Venous: 1.5 mmol/L (ref 0.5–2.2)

## 2013-07-06 LAB — MAGNESIUM: Magnesium: 2.3 mg/dL (ref 1.5–2.5)

## 2013-07-06 LAB — BLOOD GAS, ARTERIAL
Acid-Base Excess: 3.4 mmol/L — ABNORMAL HIGH (ref 0.0–2.0)
Bicarbonate: 26.8 mEq/L — ABNORMAL HIGH (ref 20.0–24.0)
Patient temperature: 102.9
TCO2: 23.5 mmol/L (ref 0–100)
pCO2 arterial: 42.6 mmHg (ref 35.0–45.0)
pH, Arterial: 7.427 (ref 7.350–7.450)

## 2013-07-06 LAB — MRSA PCR SCREENING: MRSA by PCR: NEGATIVE

## 2013-07-06 LAB — RENAL FUNCTION PANEL
Albumin: 3.2 g/dL — ABNORMAL LOW (ref 3.5–5.2)
Calcium: 7.9 mg/dL — ABNORMAL LOW (ref 8.4–10.5)
Chloride: 96 mEq/L (ref 96–112)
Creatinine, Ser: 1.33 mg/dL — ABNORMAL HIGH (ref 0.50–1.10)
GFR calc Af Amer: 46 mL/min — ABNORMAL LOW (ref >90)
GFR calc non Af Amer: 39 mL/min — ABNORMAL LOW (ref >90)
Phosphorus: 2.3 mg/dL (ref 2.3–4.6)
Potassium: 2.6 mEq/L — CL (ref 3.5–5.1)
Sodium: 152 mEq/L — ABNORMAL HIGH (ref 135–145)

## 2013-07-06 LAB — URINE MICROSCOPIC-ADD ON

## 2013-07-06 LAB — COMPREHENSIVE METABOLIC PANEL
ALT: 10 U/L (ref 0–35)
AST: 14 U/L (ref 0–37)
Albumin: 3 g/dL — ABNORMAL LOW (ref 3.5–5.2)
Alkaline Phosphatase: 67 U/L (ref 39–117)
BUN: 25 mg/dL — ABNORMAL HIGH (ref 6–23)
Chloride: 101 mEq/L (ref 96–112)
Potassium: 2.6 mEq/L — CL (ref 3.5–5.1)
Sodium: 156 mEq/L — ABNORMAL HIGH (ref 135–145)
Total Bilirubin: 0.3 mg/dL (ref 0.3–1.2)

## 2013-07-06 LAB — FOLATE RBC: RBC Folate: 784 ng/mL — ABNORMAL HIGH (ref >=366)

## 2013-07-06 LAB — CBC
HCT: 38.9 % (ref 36.0–46.0)
MCH: 31 pg (ref 26.0–34.0)
MCHC: 33.7 g/dL (ref 30.0–36.0)
Platelets: 236 10*3/uL (ref 150–400)
RDW: 15.3 % (ref 11.5–15.5)
WBC: 5.8 10*3/uL (ref 4.0–10.5)

## 2013-07-06 LAB — GLUCOSE, CAPILLARY: Glucose-Capillary: 411 mg/dL — ABNORMAL HIGH (ref 70–99)

## 2013-07-06 MED ORDER — INSULIN DETEMIR 100 UNIT/ML ~~LOC~~ SOLN
25.0000 [IU] | Freq: Two times a day (BID) | SUBCUTANEOUS | Status: DC
  Administered 2013-07-06 (×2): 25 [IU] via SUBCUTANEOUS
  Filled 2013-07-06 (×3): qty 0.25

## 2013-07-06 MED ORDER — POTASSIUM CHLORIDE 10 MEQ/100ML IV SOLN
10.0000 meq | INTRAVENOUS | Status: AC
  Administered 2013-07-06 (×6): 10 meq via INTRAVENOUS
  Filled 2013-07-06 (×11): qty 100

## 2013-07-06 MED ORDER — LEVOTHYROXINE SODIUM 100 MCG IV SOLR
37.5000 ug | Freq: Every day | INTRAVENOUS | Status: DC
  Administered 2013-07-06 – 2013-07-08 (×3): 37.5 ug via INTRAVENOUS
  Administered 2013-07-09: 11:00:00 via INTRAVENOUS
  Filled 2013-07-06 (×5): qty 5

## 2013-07-06 MED ORDER — LEVETIRACETAM 500 MG/5ML IV SOLN
1000.0000 mg | Freq: Once | INTRAVENOUS | Status: AC
  Administered 2013-07-06: 1000 mg via INTRAVENOUS
  Filled 2013-07-06: qty 10

## 2013-07-06 MED ORDER — SODIUM CHLORIDE 0.9 % IV SOLN
2.0000 g | INTRAVENOUS | Status: DC
  Administered 2013-07-06 – 2013-07-09 (×19): 2 g via INTRAVENOUS
  Filled 2013-07-06 (×23): qty 2000

## 2013-07-06 MED ORDER — DEXTROSE 5 % IV SOLN
2.0000 g | Freq: Two times a day (BID) | INTRAVENOUS | Status: DC
  Administered 2013-07-06 – 2013-07-12 (×13): 2 g via INTRAVENOUS
  Filled 2013-07-06 (×15): qty 2

## 2013-07-06 MED ORDER — LORAZEPAM 2 MG/ML IJ SOLN
INTRAMUSCULAR | Status: AC
  Filled 2013-07-06: qty 1

## 2013-07-06 MED ORDER — FAMOTIDINE IN NACL 20-0.9 MG/50ML-% IV SOLN
20.0000 mg | INTRAVENOUS | Status: DC
  Administered 2013-07-06: 20 mg via INTRAVENOUS
  Filled 2013-07-06 (×2): qty 50

## 2013-07-06 MED ORDER — POTASSIUM CHLORIDE 2 MEQ/ML IV SOLN
INTRAVENOUS | Status: DC
  Administered 2013-07-06 – 2013-07-08 (×5): via INTRAVENOUS
  Filled 2013-07-06 (×15): qty 1000

## 2013-07-06 MED ORDER — VANCOMYCIN HCL 10 G IV SOLR: 1500.0000 mg | INTRAVENOUS | Status: DC

## 2013-07-06 MED ORDER — INSULIN ASPART 100 UNIT/ML ~~LOC~~ SOLN
0.0000 [IU] | Freq: Three times a day (TID) | SUBCUTANEOUS | Status: DC
  Administered 2013-07-06 (×2): 20 [IU] via SUBCUTANEOUS
  Administered 2013-07-07 – 2013-07-08 (×2): 11 [IU] via SUBCUTANEOUS
  Administered 2013-07-08 (×2): 15 [IU] via SUBCUTANEOUS

## 2013-07-06 MED ORDER — SODIUM CHLORIDE 0.9 % IV BOLUS (SEPSIS)
500.0000 mL | Freq: Once | INTRAVENOUS | Status: AC
  Administered 2013-07-06: 08:00:00 500 mL via INTRAVENOUS

## 2013-07-06 MED ORDER — INSULIN DETEMIR 100 UNIT/ML ~~LOC~~ SOLN
30.0000 [IU] | Freq: Two times a day (BID) | SUBCUTANEOUS | Status: DC
  Filled 2013-07-06: qty 0.3

## 2013-07-06 MED ORDER — VANCOMYCIN HCL 10 G IV SOLR
2000.0000 mg | INTRAVENOUS | Status: AC
  Administered 2013-07-06: 2000 mg via INTRAVENOUS
  Filled 2013-07-06: qty 2000

## 2013-07-06 MED ORDER — SODIUM CHLORIDE 0.9 % IV SOLN
2.0000 g | INTRAVENOUS | Status: DC
  Filled 2013-07-06 (×3): qty 2000

## 2013-07-06 MED ORDER — LORAZEPAM 2 MG/ML IJ SOLN
1.0000 mg | INTRAMUSCULAR | Status: DC | PRN
  Administered 2013-07-06 (×2): 1 mg via INTRAVENOUS
  Filled 2013-07-06: qty 1

## 2013-07-06 MED ORDER — DEXTROSE 5 % IV SOLN
500.0000 mg | Freq: Two times a day (BID) | INTRAVENOUS | Status: DC
  Administered 2013-07-06 – 2013-07-08 (×4): 500 mg via INTRAVENOUS
  Filled 2013-07-06 (×6): qty 10

## 2013-07-06 MED ORDER — VANCOMYCIN HCL 10 G IV SOLR
1250.0000 mg | INTRAVENOUS | Status: DC
  Filled 2013-07-06: qty 1250

## 2013-07-06 MED ORDER — SODIUM CHLORIDE 0.9 % IV SOLN
500.0000 mg | Freq: Two times a day (BID) | INTRAVENOUS | Status: DC
  Administered 2013-07-06 – 2013-07-17 (×22): 500 mg via INTRAVENOUS
  Filled 2013-07-06 (×25): qty 5

## 2013-07-06 MED ORDER — DEXAMETHASONE SODIUM PHOSPHATE 10 MG/ML IJ SOLN
10.0000 mg | Freq: Four times a day (QID) | INTRAMUSCULAR | Status: DC
  Administered 2013-07-06 – 2013-07-09 (×13): 10 mg via INTRAVENOUS
  Filled 2013-07-06 (×20): qty 1

## 2013-07-06 NOTE — Progress Notes (Addendum)
[facility] PROGRESS NOTE  Regina [name] Baptista ZOX:[id number] DOB: 07-30-1941 DOA: 07/01/2013 PCP: Michiel Sites, MD  Assessment/Plan: #1 acute encephalopathy Questionable etiology. May be secondary to seizures in the setting of probable bacterial meningitis with patient with nuchal rigidity and a rectal temperature of 102 with altered mental status. Per nursing patient with right upper extremity jerking motions all night with worsening mental status. CT of the head which was done yesterday was negative. MRI of the head is pending. EEG is pending. Patient is hypokalemic with potassium of 2.6 today and a magnesium was 1.3 yesterday which were likely secondary to GI losses. We'll transfer patient to the ICU. Patient has been given a dose of Ativan IV x1 which improved her jerking motions. Placed on Ativan 1 mg IV every 2 hours when necessary. Neuro checks every 4 hours. Unable to do a lumbar puncture as patient has been on Aggrenox and Lovenox which have been discontinued. We'll repeat chest x-ray. Repeat UA with cultures and sensitivities. Repeat blood cultures. Replete electrolytes. Will place empirically on IV vancomycin, Rocephin, ampicillin, IV Decadron. Will give her low-dose Keppra 1 g IV x1 now and place on Keppra 500 mg IV every 12 hours. Will consult with neurology for further evaluation and management.  #2 probable seizure Patient noted to have jerking motion of the right upper extremity mostly throughout the night last night in addition to altered mental status. Patient's eyes rolled and deviated to the right. Patient also noted to have a fever. CT of the head was negative. Patient given a dose of Ativan 1 mg IV x1 with improvement in jerking motion. MRI of the head pending. EEG is pending. Will load with Keppra 1 g IV x1. Place on Keppra 500 mg IV every 12 hours. Replete electrolytes. Blood cultures pending. Placed empirically on IV vancomycin, Rocephin, ampicillin and Decadron for possible  meningitis. Neuro checks. Will consult with neurology.  #3 possible meningitis/CSF infection  Patient noted to have a rectal temperature 102. Patient with significant nuchal rigidity. Patient with altered mental status. Blood cultures are pending. Unable to perform the LP as patient was on Aggrenox and Lovenox. We'll discontinue Aggrenox and Lovenox for now. If mental status does not improve in the next 24 hours may require lumbar puncture. We'll place empirically on IV vancomycin, Rocephin, ampicillin, Decadron to cover empirically for CSF infection.  #4 acute renal failure Likely secondary to volume depletion and hypotension in the setting of ARB. Clinical improvement. Renal function with creatinine now of 1.33. Good urine output with 5300 cc over the past 24 hours. Continue Foley catheter. Bicarbonate drip has been discontinued as acidosis has resolved. Continue to hold ARB and metformin. Nephrology following and appreciate input and recommendations.  #5 hypokalemia/hypomagnesemia Likely secondary to GI losses from C. difficile colitis and increased urinary output. Replete. Patient was given some magnesium yesterday. Repeat magnesium level.  #6 C. difficile colitis On IV Flagyl. Follow.  #7 hypernatremia We'll give a bolus of normal saline. Increased D5W 100 cc per hour. Follow.  #8 Escherichia coli UTI Patient had received IV Rocephin x3 days. DC IV Rocephin yesterday. Resume IV Rocephin in light of concern for bacterial meningitis.  #9 hypertension Continue IV Lopressor.  #10 history of CVA Hold Aggrenox, for possible lumbar puncture if no improvement in mental status tomorrow.  #11 diabetes mellitus Hemoglobin A1c is 9.6. CBGs have ranged from 285- 346. Increase Levemir to 25 units subcutaneous twice a day. Change sliding scale insulin to resistant.  #12 hypothyroidism  Continue Synthroid and Cytomel.  #13 anxiety/depression Stable.  #14 prophylaxis  Pepcid for GI  prophylaxis. DC Lovenox secondary to possible lumbar puncture tomorrow if no improvement in mental status. SCDs for DVT prophylaxis.  Code Status: Full Family Communication: No family at bedside. Disposition Plan: Transfer to ICU.   Consultants:  Nephrology: Dr. Eliott Nine 07/02/2013:  Neurology pending  Procedures:  CT head 07/05/2013  KUB 07/05/2013  KUB 07/01/2013  Chest x-ray 07/01/2013  Antibiotics:  IV Rocephin 07/01/2013--> 07/05/2013  IV Flagyl 07/04/2013-  IV vancomycin 07/06/2013  IV Rocephin 07/06/2013  IV ampicillin 07/06/2013  HPI/Subjective: Patient is lethargic. Patient minimally responsive to verbal and noxious stimuli. Per nursing patient with right upper extremity jerking motions overnight and worsening mental status.  Objective: Filed Vitals:   07/06/13 0718  BP: 150/68  Pulse: 114  Temp: 100.3 F (37.9 C)  Resp: 26    Intake/Output Summary (Last 24 hours) at 07/06/13 0811 Last data filed at 07/06/13 0550  Gross per 24 hour  Intake   1660 ml  Output   5300 ml  Net  -3640 ml   Filed Weights   07/04/13 0457 07/05/13 0520 07/06/13 0500  Weight: 104.282 kg (229 lb 14.4 oz) 97.433 kg (214 lb 12.8 oz) 100.426 kg (221 lb 6.4 oz)    Exam:   General:  Lehtargic. Minimally responsive. Eyes rolled back.   Neck: Rigid neck  Cardiovascular: Tachycardia  Respiratory: CTAB anterior lung fields.  Abdomen: Soft/NT/ND/+BS  Musculoskeletal: No c/c/e  Neuro: Patient is lethargic not responding to verbal stimuli. His eyelids minimally to noxious stimuli. Eyes rolled back. Right eye deviated to the right. Pupils minimally responsive to light. Neck rigidity.  Data Reviewed: Basic Metabolic Panel:  Recent Labs Lab 07/02/13 0545 07/03/13 0535 07/04/13 0810 07/05/13 0420 07/05/13 0820 07/06/13 0443  NA 131* 134* 139 152* 147* 152*  K 4.2 3.6 3.6 2.6* 2.7* 2.6*  CL 103 94* 85* 95* 92* 96  CO2 12* 17* 17* 29 27 25   GLUCOSE 37* 306* 452*  294* 328* 474*  BUN 84* 78* 58* 36* 32* 25*  CREATININE 7.93* 6.81* 3.57* 2.01* 1.65* 1.33*  CALCIUM 8.6 7.8* 8.2* 7.8* 7.8* 7.9*  MG  --   --   --   --  1.3* 2.3  PHOS  --  4.6 4.1 3.0  --  2.3   Liver Function Tests:  Recent Labs Lab 07/01/13 1134 07/03/13 0535 07/04/13 0810 07/05/13 0420 07/06/13 0443  AST 14  --   --   --   --   ALT 12  --   --   --   --   ALKPHOS 66  --   --   --   --   BILITOT 0.4  --   --   --   --   PROT 6.5  --   --   --   --   ALBUMIN 3.2* 2.4* 2.9* 2.9* 3.2*   No results found for this basename: LIPASE, AMYLASE,  in the last 168 hours No results found for this basename: AMMONIA,  in the last 168 hours CBC:  Recent Labs Lab 07/01/13 1134 07/02/13 0545 07/03/13 0535 07/05/13 0820 07/06/13 0443  WBC 7.1 7.5 4.9 4.4 5.8  NEUTROABS 4.7  --   --   --   --   HGB 12.4 11.4* 10.4* 12.1 13.1  HCT 35.3* 31.8* 29.7* 34.7* 38.9  MCV 88.5 88.6 87.6 88.7 92.0  PLT 167 200 175 221 236  Cardiac Enzymes:  Recent Labs Lab 07/01/13 1134  CKTOTAL 38  TROPONINI <0.30   BNP (last 3 results) No results found for this basename: PROBNP,  in the last 8760 hours CBG:  Recent Labs Lab 07/04/13 2113 07/05/13 0732 07/05/13 1150 07/05/13 1702 07/05/13 2122  GLUCAP 271* 285* 332* 344* 346*    Recent Results (from the past 240 hour(s))  URINE CULTURE     Status: None   Collection Time    07/01/13 12:37 PM      Result Value Range Status   Specimen Description URINE, CATHETERIZED   Final   Special Requests NONE   Final   Culture  Setup Time     Final   Value: 07/01/2013 20:06     Performed at [facility] Count     Final   Value: >=100,000 COLONIES/ML     Performed at [facility]   Culture     Final   Value: ESCHERICHIA COLI     Performed at [facility]   Report Status 07/03/2013 FINAL   Final   Organism ID, Bacteria ESCHERICHIA COLI   Final     Studies: Dg Abd 1 View  07/05/2013   CLINICAL DATA:  Nausea  and vomiting  EXAM: ABDOMEN - 1 VIEW  COMPARISON:  July 01, 2013  FINDINGS: Normal bowel gas pattern. Negative for bowel obstruction. Lumbar degenerative changes. No acute bony abnormality. Degenerative change in the left hip joint.  IMPRESSION: Nonobstructive bowel gas pattern.   Electronically Signed   By: Marlan Palau M.D.   On: 07/05/2013 09:58   Ct Head Wo Contrast  07/05/2013   CLINICAL DATA:  Altered mental status  EXAM: CT HEAD WITHOUT CONTRAST  TECHNIQUE: Contiguous axial images were obtained from the base of the skull through the vertex without intravenous contrast.  COMPARISON:  MRI 06/08/2013, CT 06/07/2013.  FINDINGS: Moderate atrophy. Negative for hydrocephalus. Chronic microvascular ischemic change throughout the white matter. Chronic lacunar infarct left globus pallidus. Chronic ischemic change in the pons.  Negative for acute infarct. Negative for hemorrhage or mass. Calvarium is intact.  IMPRESSION: Atrophy and chronic microvascular ischemia. No acute abnormality.   Electronically Signed   By: Marlan Palau M.D.   On: 07/05/2013 15:11    Scheduled Meds: . ampicillin (OMNIPEN) IV  2 g Intravenous Q4H  . cefTRIAXone (ROCEPHIN)  IV  2 g Intravenous Q12H  . dexamethasone  10 mg Intravenous Q6H  . dipyridamole-aspirin  1 capsule Oral BID  . enoxaparin (LOVENOX) injection  30 mg Subcutaneous Q24H  . ezetimibe  10 mg Oral Daily  . famotidine  20 mg Oral Daily  . feeding supplement  237 mL Oral BID BM  . insulin aspart  0-15 Units Subcutaneous TID WC  . insulin detemir  20 Units Subcutaneous BID  . latanoprost  1 drop Both Eyes QHS  . levETIRAcetam  1,000 mg Intravenous Once  . levETIRAcetam  500 mg Intravenous Q12H  . levothyroxine  75 mcg Oral QAC breakfast  . liothyronine  25 mcg Oral BID  . metoprolol  5 mg Intravenous Q8H  . metronidazole  500 mg Intravenous Q8H  . multivitamin with minerals  1 tablet Oral Daily  . potassium chloride  10 mEq Intravenous Q1 Hr x 6  .  simvastatin  20 mg Oral QPM  . sodium chloride  500 mL Intravenous Once  . sodium chloride  3 mL Intravenous Q12H   Continuous Infusions: . dextrose  5 % 1,000 mL with potassium chloride 40 mEq infusion      Principal Problem:   Acute encephalopathy Active Problems:   HYPOTHYROIDISM   DIABETES MELLITUS, TYPE II   HYPERLIPIDEMIA   UTI (urinary tract infection)   HTN (hypertension)   Anxiety and depression   CVA (cerebral infarction)   Weakness generalized   Dehydration   Acute renal failure   Failure to thrive   Diarrhea   Hyponatremia   Metabolic acidosis   Clostridium difficile colitis   Hypomagnesemia   Hypernatremia   Seizures    Time spent: > 35 mins    [facility]  [facility] Pager [phone]. If 7PM-7AM, please contact night-coverage at [email or web address], password [facility] 07/06/2013, 8:11 AM  LOS: 5 days      Addendum: 07/06/13 852am After transfer to ICU patient noted to be hypoxic on NRB. Cdiff was ordered but cancelled. Will repeat Cdiff PCR, CMET, lactic acid, procalcitonin.  Consult with PCCM. Follow.

## 2013-07-06 NOTE — Procedures (Signed)
ELECTROENCEPHALOGRAM REPORT   Patient: Regina [name]       Room #: EX5284 EEG No. ID: [id number] Age: 72 y.o.        Sex: female Referring Physician: Janee Morn Report Date:  07/06/2013        Interpreting Physician: Thana Farr D  History: [name] is an 72 y.o. female with altered mental status and seizures  Medications:  Scheduled: . acyclovir  500 mg Intravenous Q12H  . ampicillin (OMNIPEN) IV  2 g Intravenous Q4H  . cefTRIAXone (ROCEPHIN)  IV  2 g Intravenous Q12H  . dexamethasone  10 mg Intravenous Q6H  . famotidine (PEPCID) IV  20 mg Intravenous Q24H  . feeding supplement  237 mL Oral BID BM  . insulin aspart  0-20 Units Subcutaneous TID WC  . insulin detemir  25 Units Subcutaneous BID  . latanoprost  1 drop Both Eyes QHS  . levETIRAcetam  500 mg Intravenous Q12H  . levothyroxine  37.5 mcg Intravenous Daily  . liothyronine  25 mcg Oral BID  . metoprolol  5 mg Intravenous Q8H  . metronidazole  500 mg Intravenous Q8H  . simvastatin  20 mg Oral QPM  . sodium chloride  3 mL Intravenous Q12H  . [START ON 07/07/2013] vancomycin  1,250 mg Intravenous Q24H    Conditions of Recording:  This is a 16 channel EEG carried out with the patient in the poorly responsive state.  Description:  The background activity consists of a poorly organized polymorphic delta and theta activity that is diffusely distributed over both hemispheres.  There are times when this activity is not continuous and is interrupted by periods of attenuation lasting 3-5 seconds.  These periods of attenuation are symmetrical and seen over both hemispheres.  Triphasic waves are seen intermittently during the tracing.  There are rare occasions when left mid-temporal sharp transients are seen as well with phase reversal at T3.  No evidence of stage II sleep is noted.  Hyperventilation and intermittent photic stimulation were not performed.  IMPRESSION: This is an abnormal EEG consistent with a diffuse cerebral  disturbance that is likely metabolic in nature.  No evidence of nonconvulsive seizure activity was seen.  It should be noted that there were rare left mid-temporal sharp transients seen that may suggest a focal disturbance as well with epileptogenic potential, etiology nonspecific.   Thana Farr, MD [facility] [phone] 07/06/2013, 6:20 PM

## 2013-07-06 NOTE — Consult Note (Signed)
Reason for Consult:Altered mental status  Referring Physician: Janee Morn  CC: Altered mental status and fever  HPI: Regina [name] is an 72 y.o. female that was admitted on 9/20 with weakness and altered mental status and an underlying dementia.  Patient was felt to be uremic and found to have a creatinine of 7.83 and a UTI.  She was also felt to be dehydrated with a concomittant hyponatremia and uncontrolled diabetes.  The patient's metabolic issues were addressed but her mental status did not improve.  Overnight the patient was noted by nursing to have jerking movements of the right upper extremity that were reasonably persistent throughout the night.  Patient was also febrile as well.  Today mental status had further deteriorated.  Ativan was given with discontinuation of the focal activity.  Consult was called for further recommendations.   Past Medical History  Diagnosis Date  . Hypertension   . Hypercholesteremia   . TIA (transient ischemic attack) 1990's    "mini strokes" (06/07/2013)  . Legally blind     "both eyes; some vision in the right' (06/07/2013)  . Heart murmur   . Hypothyroidism   . Type II diabetes mellitus   . Anemia   . GERD (gastroesophageal reflux disease)   . Arthritis     "left leg" (06/07/2013)  . Depression   . Vulva cancer   . Altered mental status     "the first time I noticed any problem was 2 d ago" (06/07/2013)    Past Surgical History  Procedure Laterality Date  . Femur fracture surgery Left 1999  . Cesarean section  1968  . Tubal ligation  1970's  . Vulva surgery  1990's    "cut a big chunk out for cancer" (06/07/2013)    Family History  Problem Relation Age of Onset  . Hypertension Mother   . Hyperlipidemia Mother   . Hyperlipidemia Father   . Hypertension Father   . Diabetes Father     Social History:  reports that she quit smoking about 20 years ago. Her smoking use included Cigarettes. She has a 120 pack-year smoking history. She has never  used smokeless tobacco. She reports that she does not drink alcohol or use illicit drugs.  Allergies  Allergen Reactions  . Amoxicillin     REACTION: rash  . Ciprofloxacin     REACTION: hives  . Codeine     REACTION: nervous, shaky  . Duloxetine     REACTION: reaction not known  . Hydrochlorothiazide W-Triamterene     REACTION: hyponatremia  . Ticlopidine Hcl     REACTION: bleeding    Medications:  I have reviewed the patient's current medications. Prior to Admission:  Prescriptions prior to admission  Medication Sig Dispense Refill  . amLODipine-valsartan (EXFORGE) 10-320 MG per tablet Take 1 tablet by mouth daily.  30 tablet  0  . Calcium Carbonate-Vit D-Min (CALCIUM 1200 PO) Take 1 tablet by mouth daily.      . carvedilol (COREG) 12.5 MG tablet Take 1 tablet (12.5 mg total) by mouth 2 (two) times daily with a meal.  60 tablet  0  . dipyridamole-aspirin (AGGRENOX) 200-25 MG per 12 hr capsule Take 1 capsule by mouth 2 (two) times daily.      Marland Kitchen ezetimibe (ZETIA) 10 MG tablet Take 10 mg by mouth daily.      . ferrous fumarate (HEMOCYTE - 106 MG FE) 325 (106 FE) MG TABS tablet Take 1 tablet by mouth daily.      Marland Kitchen  insulin aspart (NOVOLOG) 100 UNIT/ML injection 0-9 Units, Subcutaneous, 3 times daily with meals CBG < 70: implement hypoglycemia protocol CBG 70 - 120: 0 units CBG 121 - 150: 1 unit CBG 151 - 200: 2 units CBG 201 - 250: 3 units CBG 251 - 300: 5 units CBG 301 - 350: 7 units CBG 351 - 400: 9 units CBG > 400: call MD  1 vial  12  . insulin detemir (LEVEMIR) 100 UNIT/ML injection Inject 0.45 mLs (45 Units total) into the skin 2 (two) times daily.  10 mL  3  . levothyroxine (SYNTHROID, LEVOTHROID) 75 MCG tablet Take 1 tablet (75 mcg total) by mouth daily before breakfast.  30 tablet  0  . liothyronine (CYTOMEL) 25 MCG tablet Take 25 mcg by mouth 2 (two) times daily.      . metFORMIN (GLUCOPHAGE) 500 MG tablet Take 500 mg by mouth 2 (two) times daily.       . Multiple  Vitamin (MULTIVITAMIN WITH MINERALS) TABS tablet Take 1 tablet by mouth daily.      . Omega-3 Fatty Acids (FISH OIL) 1000 MG CAPS Take 2,000 mg by mouth daily.      . ranitidine (ZANTAC) 75 MG tablet Take 75 mg by mouth 2 (two) times daily.      . simvastatin (ZOCOR) 20 MG tablet Take 1 tablet (20 mg total) by mouth every evening.  30 tablet  0  . Travoprost, BAK Free, (TRAVATAN) 0.004 % SOLN ophthalmic solution Place 1 drop into both eyes at bedtime.       Scheduled: . ampicillin (OMNIPEN) IV  2 g Intravenous Q4H  . cefTRIAXone (ROCEPHIN)  IV  2 g Intravenous Q12H  . dexamethasone  10 mg Intravenous Q6H  . famotidine (PEPCID) IV  20 mg Intravenous Q24H  . feeding supplement  237 mL Oral BID BM  . insulin aspart  0-20 Units Subcutaneous TID WC  . insulin detemir  25 Units Subcutaneous BID  . latanoprost  1 drop Both Eyes QHS  . levETIRAcetam  500 mg Intravenous Q12H  . levothyroxine  37.5 mcg Intravenous Daily  . liothyronine  25 mcg Oral BID  . metoprolol  5 mg Intravenous Q8H  . metronidazole  500 mg Intravenous Q8H  . simvastatin  20 mg Oral QPM  . sodium chloride  3 mL Intravenous Q12H  . [START ON 07/07/2013] vancomycin  1,500 mg Intravenous Q24H    ROS: Unable to obtain secondary to mental status  Physical Examination: Blood pressure 160/62, pulse 89, temperature 98.4 F (36.9 C), temperature source Oral, resp. rate 14, height 5\' 2"  (1.575 m), weight 89.3 kg (196 lb 13.9 oz), SpO2 98.00%.  Neurologic Examination Mental Status: Patient does not respond to verbal stimuli.  With deep sternal rub localizes to pain with both upper extremities (right greater than left).  Does not follow commands.  No verbalizations noted.  Cranial Nerves: II: patient does not respond confrontation bilaterally, pupils right 3 mm, left 3 mm,and reactive bilaterally.   III,IV,VI: Patient resists turning head and therefore oculocephalic maneuvers can not be performed.  Looks preferentially to the  right.  Can not get patient to go beyond midline.   V,VII: corneal reflex reduced bilaterally  VIII: patient does not respond to verbal stimuli IX,X: gag reflex reduced, XI: trapezius strength unable to test bilaterally but patient does move head from side-to-side spontaneously XII: tongue strength unable to test Motor: Moves all extremities but moves the right greater than the left.  Moves the right upper extremity spontaneously above her head. Sensory: Respond to noxious stimuli in all extremities. Deep Tendon Reflexes:  2+ in the upper extremities and 1+ in the lower extremities Plantars: downgoing on the right and mute on the left Cerebellar: Unable to perform    Laboratory Studies:   Basic Metabolic Panel:  Recent Labs Lab 07/02/13 0545 07/03/13 0535 07/04/13 0810 07/05/13 0420 07/05/13 0820 07/06/13 0443 07/06/13 0830  NA 131* 134* 139 152* 147* 152* 156*  K 4.2 3.6 3.6 2.6* 2.7* 2.6* 2.6*  CL 103 94* 85* 95* 92* 96 101  CO2 12* 17* 17* 29 27 25 26   GLUCOSE 37* 306* 452* 294* 328* 474* 453*  BUN 84* 78* 58* 36* 32* 25* 25*  CREATININE 7.93* 6.81* 3.57* 2.01* 1.65* 1.33* 1.38*  CALCIUM 8.6 7.8* 8.2* 7.8* 7.8* 7.9* 7.6*  MG  --   --   --   --  1.3* 2.3  --   PHOS  --  4.6 4.1 3.0  --  2.3  --     Liver Function Tests:  Recent Labs Lab 07/01/13 1134 07/03/13 0535 07/04/13 0810 07/05/13 0420 07/06/13 0443 07/06/13 0830  AST 14  --   --   --   --  14  ALT 12  --   --   --   --  10  ALKPHOS 66  --   --   --   --  67  BILITOT 0.4  --   --   --   --  0.3  PROT 6.5  --   --   --   --  6.4  ALBUMIN 3.2* 2.4* 2.9* 2.9* 3.2* 3.0*   No results found for this basename: LIPASE, AMYLASE,  in the last 168 hours No results found for this basename: AMMONIA,  in the last 168 hours  CBC:  Recent Labs Lab 07/01/13 1134 07/02/13 0545 07/03/13 0535 07/05/13 0820 07/06/13 0443 07/06/13 0845  WBC 7.1 7.5 4.9 4.4 5.8 5.0  NEUTROABS 4.7  --   --   --   --  2.9  HGB  12.4 11.4* 10.4* 12.1 13.1 13.0  HCT 35.3* 31.8* 29.7* 34.7* 38.9 38.6  MCV 88.5 88.6 87.6 88.7 92.0 92.8  PLT 167 200 175 221 236 229    Cardiac Enzymes:  Recent Labs Lab 07/01/13 1134  CKTOTAL 38  TROPONINI <0.30    BNP: No components found with this basename: POCBNP,   CBG:  Recent Labs Lab 07/05/13 0732 07/05/13 1150 07/05/13 1702 07/05/13 2122 07/06/13 0735  GLUCAP 285* 332* 344* 346* 411*    Microbiology: Results for orders placed during the hospital encounter of 07/01/13  URINE CULTURE     Status: None   Collection Time    07/01/13 12:37 PM      Result Value Range Status   Specimen Description URINE, CATHETERIZED   Final   Special Requests NONE   Final   Culture  Setup Time     Final   Value: 07/01/2013 20:06     Performed at [facility] Count     Final   Value: >=100,000 COLONIES/ML     Performed at [facility]   Culture     Final   Value: ESCHERICHIA COLI     Performed at [facility]   Report Status 07/03/2013 FINAL   Final   Organism ID, Bacteria ESCHERICHIA COLI   Final  MRSA PCR SCREENING  Status: None   Collection Time    07/06/13  9:25 AM      Result Value Range Status   MRSA by PCR NEGATIVE  NEGATIVE Final   Comment:            The GeneXpert MRSA Assay (FDA     approved for NASAL specimens     only), is one component of a     comprehensive MRSA colonization     surveillance program. It is not     intended to diagnose MRSA     infection nor to guide or     monitor treatment for     MRSA infections.    Coagulation Studies: No results found for this basename: LABPROT, INR,  in the last 72 hours  Urinalysis:  Recent Labs Lab 07/01/13 1237 07/06/13 0801  COLORURINE YELLOW YELLOW  LABSPEC 1.019 1.028  PHURINE 6.0 5.0  GLUCOSEU NEGATIVE >1000*  HGBUR MODERATE* MODERATE*  BILIRUBINUR NEGATIVE MODERATE*  KETONESUR NEGATIVE >80*  PROTEINUR >300* 100*  UROBILINOGEN 0.2 0.2  NITRITE NEGATIVE  NEGATIVE  LEUKOCYTESUR LARGE* MODERATE*    Lipid Panel:     Component Value Date/Time   CHOL 211* 06/05/2013 0650   TRIG 179* 06/05/2013 0650   HDL 33* 06/05/2013 0650   CHOLHDL 6.4 06/05/2013 0650   VLDL 36 06/05/2013 0650   LDLCALC 142* 06/05/2013 0650    HgbA1C:  Lab Results  Component Value Date   HGBA1C 9.6* 06/07/2013    Urine Drug Screen:   No results found for this basename: labopia, cocainscrnur, labbenz, amphetmu, thcu, labbarb    Alcohol Level: No results found for this basename: ETH,  in the last 168 hours   Imaging: Dg Abd 1 View  07/05/2013   CLINICAL DATA:  Nausea and vomiting  EXAM: ABDOMEN - 1 VIEW  COMPARISON:  July 01, 2013  FINDINGS: Normal bowel gas pattern. Negative for bowel obstruction. Lumbar degenerative changes. No acute bony abnormality. Degenerative change in the left hip joint.  IMPRESSION: Nonobstructive bowel gas pattern.   Electronically Signed   By: Marlan Palau M.D.   On: 07/05/2013 09:58   Ct Head Wo Contrast  07/05/2013   CLINICAL DATA:  Altered mental status  EXAM: CT HEAD WITHOUT CONTRAST  TECHNIQUE: Contiguous axial images were obtained from the base of the skull through the vertex without intravenous contrast.  COMPARISON:  MRI 06/08/2013, CT 06/07/2013.  FINDINGS: Moderate atrophy. Negative for hydrocephalus. Chronic microvascular ischemic change throughout the white matter. Chronic lacunar infarct left globus pallidus. Chronic ischemic change in the pons.  Negative for acute infarct. Negative for hemorrhage or mass. Calvarium is intact.  IMPRESSION: Atrophy and chronic microvascular ischemia. No acute abnormality.   Electronically Signed   By: Marlan Palau M.D.   On: 07/05/2013 15:11   Mr Brain Wo Contrast  07/06/2013   CLINICAL DATA:  Altered mental status  EXAM: MRI HEAD WITHOUT CONTRAST  TECHNIQUE: Multiplanar, multisequence MR imaging was performed. No intravenous contrast was administered.  COMPARISON:  MRI 06/08/2013  FINDINGS:  Image quality degraded by motion.  Negative for acute infarct. Diffusion-weighted imaging is diagnostic.  Generalized atrophy. Chronic microvascular ischemic changes in the white matter. Chronic infarcts in the basal ganglia and pons bilaterally. No hemorrhage or mass. No fluid collection.  Paranasal sinuses are clear.  IMPRESSION: Image quality degraded by motion. No acute abnormality.   Electronically Signed   By: Marlan Palau M.D.   On: 07/06/2013 13:34   Dg Chest Port 1  View  07/06/2013   CLINICAL DATA:  Fever  EXAM: PORTABLE CHEST - 1 VIEW  COMPARISON:  07/01/2013  FINDINGS: Cardiomediastinal silhouette is stable. Central mild vascular congestion without pulmonary edema. There is poor inspiration with mild basilar atelectasis. Bony thorax is stable. No segmental infiltrate or pleural effusion.  IMPRESSION: Poor inspiration with mild basilar atelectasis. No segmental infiltrate or pulmonary edema. Central mild vascular congestion.   Electronically Signed   By: Natasha Mead   On: 07/06/2013 08:14     Assessment/Plan: 72 year old female with mental status changes, fever and seizure.  Patient was on Rocephin for three days after admission.  This has recently been discontinued.  It was during this period of time that the seizure was noted.  Although renal function has improved she continues to have multiple other metabolic issues.  Concerned about the development of fever though and the mild focality noted on examination.  MRI of the brain performed on today reviewed and shows no acute changes.    Recommendations: 1.  Would start broad spectrum antibiotic coverage with restart of Rocephin and addition of Vancomycin.  With focality on examination would also consider starting Acyclovir to cover for herpes encephalitis.  2.  EEG to rule out nonconvulsive status 3.  Load with Keppra 1000mg  IV and maintenance of 500mg  IV q12hours 4.  Seizure precautions 5.  Patient unable to have LP today secondary to  Lovenox.  Would hold Lovenox and Plavix today.  If no improvement by AM would consider LP at that time.    Thana Farr, MD [facility] [phone] 07/06/2013, 2:12 PM

## 2013-07-06 NOTE — Progress Notes (Signed)
PT Cancellation Note  Patient Details Name: Regina [name] MRN: [id number] DOB: 1941/09/14   Cancelled Treatment:    Reason Eval/Treat Not Completed: Medical issues which prohibited therapy (transfer to ICU). Will check back another day   Rebeca Alert, MPT Pager: [phone]

## 2013-07-06 NOTE — Progress Notes (Addendum)
ANTIBIOTIC CONSULT NOTE - INITIAL  Pharmacy Consult for Vancomycin, then ACYCLOVIR Indication: r/o meningitis  Allergies  Allergen Reactions  . Amoxicillin     REACTION: rash  . Ciprofloxacin     REACTION: hives  . Codeine     REACTION: nervous, shaky  . Duloxetine     REACTION: reaction not known  . Hydrochlorothiazide W-Triamterene     REACTION: hyponatremia  . Ticlopidine Hcl     REACTION: bleeding    Patient Measurements: Height: 5\' 2"  (157.5 cm) Weight: 221 lb 6.4 oz (100.426 kg) IBW/kg (Calculated) : 50.1  Vital Signs: Temp: 102.9 F (39.4 C) (09/25 0825) Temp src: Rectal (09/25 0825) BP: 165/74 mmHg (09/25 0825) Pulse Rate: 114 (09/25 0718) Intake/Output from previous day: 09/24 0701 - 09/25 0700 In: 1660 [I.V.:1360; IV Piggyback:300] Out: 5300 [Urine:5300] Intake/Output from this shift: Total I/O In: 650 [IV Piggyback:650] Out: -   Labs:  Recent Labs  07/05/13 0420 07/05/13 0820 07/06/13 0443 07/06/13 0845  WBC  --  4.4 5.8 5.0  HGB  --  12.1 13.1 13.0  PLT  --  221 236 229  CREATININE 2.01* 1.65* 1.33*  --    Estimated Creatinine Clearance: 43.6 ml/min (by C-G formula based on Cr of 1.33). No results found for this basename: Rolm Gala, VANCORANDOM, GENTTROUGH, GENTPEAK, GENTRANDOM, TOBRATROUGH, TOBRAPEAK, TOBRARND, AMIKACINPEAK, AMIKACINTROU, AMIKACIN,  in the last 72 hours    Assessment: 62 yoF presented 9/20 with c/o generalized weakness and lethargy. Patient found acidotic and ARF (Scr 7.83). Rocephin was started for UTI, renal function improved through hospitalization but no improvement in mental status seen. Pt febrile 9//24, with nuchal rigidity, RUE jerking and worsening mental status. CT head neg, MRI head and EEG pending. Patient transfer to ICU 9/25. Decadron, Vanc, Rocephin, Ampicillin order for r/o meningitis. Unable to do LP given Aggrenox and Lovenox. Neuro, CCM consulted.   9/20 >> Rocephin >> 9/23 9/23 >> Flagyl (MD)  >>  9/25 >> Vanc >> 9/25 >> Rocephin (MD) >> 9/25 >> Ampicillin (MD) >>  Tmax: 101.4 WBCs: afeb  Renal: ARF improved to 1.33, CG 44, N 45 PCT = pending  9/20 Urine >> E.coli  9/24 Blood x 2 >> ordered 9/25 Urine >>  ordered 9/25 C.diff PCR >> ordered  Today is D#3 Flagyl for C.diff but not sure where initial +PCR was, MD re-ordering now - if neg, discontinue Flagyl. If mental status does not improve in the next 24 hours, may need LP per MD  Goal of Therapy:  Vancomycin trough level 15-20 mcg/ml  Plan:   Vancomycin 2gm Iv x 1 then 1500 mg IV q24h  Continue Ampicillin, Rocephin as ordered by MD  Would continue Decadron for 2-4 days given possible complications (seizures, hearing loss)  F/u D/C Flagyl if C.diff PCR negative  Geoffry Paradise, PharmD, BCPS Pager: [phone] 9:35 AM Pharmacy #: [id number]   Addendum:  Neurology on board, plan adding acyclovir to cover for herpes encephalitis. Patient with a documented wt of 100.4 kg this AM.  Now EPIC is showing 89.3kg. Calling RN to verify weight. Acyclovir dosing is 10mg /kg using actual body weight OR ideal body weight if obese.   Plan: Acyclovir 500 mg IV q12h.  Given change in weight, will change Vancomycin to 1250 mg IV q24h.   Geoffry Paradise, PharmD, BCPS Pager: [phone] 2:58 PM Pharmacy #: [phone]

## 2013-07-06 NOTE — Consult Note (Signed)
PULMONARY  / CRITICAL CARE MEDICINE  Name: Regina [name] MRN: [id number] DOB: 30-Jul-1941    ADMISSION DATE:  07/01/2013 CONSULTATION DATE: 07/06/2013  REFERRING MD :  Ramiro Harvest  CHIEF COMPLAINT:  Change in mental status  BRIEF PATIENT DESCRIPTION:  72 yo female admitted with generalized weakness, and found to have UTI, diarrhea and acute renal failure.  Found to have C diff.  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  SIGNIFICANT EVENTS: 9/20 Admit 9/21 nephrology consulted 9/24 Tm 101.4 9/25 Transfer to ICU, fever, ?seizure activity, neurology consulted  STUDIES:  MRI brain 8/27 >> small acute lacunar infarct in medial Rt thalamus, ? Punctate acute lacunar infarct central pons Renal u/s 9/20 >> no hydronephrosis CT head 9/24 >> atrophy and chronic microvascular ischemia EEG 9/25 >> MRI 9/25 >>  LINES / TUBES: PIV  CULTURES: Urine 9/20 >> E coli Stool GI pathogen >> C diff Positive Blood 9/24 >>  Urine 9/25 >>  Blood 9/25 >>  ANTIBIOTICS: Rocephin 9/20 >> 9/23 Rocephin 9/25 >> Flagyl 9/22 >> Vancomycin 9/25 >>  Ampicillin 9/25 >>    HISTORY OF PRESENT ILLNESS:   Pt unable to provide hx.  Hx obtain from review of medical records.  67 female with recent hx of CVA presented with altered mental status and weakness.  Found to have UTI with E coli and acute renal failure.  Also noted to have diarrhea, and stool cx positive for C diff.  Had improvement in renal fx with IV fluids, but mental status continued to be issue.  Noted to have episodes of vomiting, and then developed fever over past 24 hours.  Concern for possible seizure activity in AM of 9/25 with fever spike.  She was transferred to ICU.  Noted to have low pulse oximeter reading, and PCCM consulted.  ABG showed acceptable PaO2 and adjustment of pulse oximeter showed 100% SpO2.  Pt noted to have movement of Rt arm, but not other extremities.  Pt has hx of being legally blind, and unsure if she has hearing  deficit.  PAST MEDICAL HISTORY :  Past Medical History  Diagnosis Date  . Hypertension   . Hypercholesteremia   . TIA (transient ischemic attack) 1990's    "mini strokes" (06/07/2013)  . Legally blind     "both eyes; some vision in the right' (06/07/2013)  . Heart murmur   . Hypothyroidism   . Type II diabetes mellitus   . Anemia   . GERD (gastroesophageal reflux disease)   . Arthritis     "left leg" (06/07/2013)  . Depression   . Vulva cancer   . Altered mental status     "the first time I noticed any problem was 2 d ago" (06/07/2013)   Past Surgical History  Procedure Laterality Date  . Femur fracture surgery Left 1999  . Cesarean section  1968  . Tubal ligation  1970's  . Vulva surgery  1990's    "cut a big chunk out for cancer" (06/07/2013)   Prior to Admission medications   Medication Sig Start Date End Date Taking? Authorizing Provider  amLODipine-valsartan (EXFORGE) 10-320 MG per tablet Take 1 tablet by mouth daily. 06/06/13  Yes Nishant Dhungel, MD  Calcium Carbonate-Vit D-Min (CALCIUM 1200 PO) Take 1 tablet by mouth daily.   Yes Historical Provider, MD  carvedilol (COREG) 12.5 MG tablet Take 1 tablet (12.5 mg total) by mouth 2 (two) times daily with a meal. 06/06/13  Yes Nishant Dhungel, MD  dipyridamole-aspirin (  AGGRENOX) 200-25 MG per 12 hr capsule Take 1 capsule by mouth 2 (two) times daily. 06/09/13  Yes Shanker Levora Dredge, MD  ezetimibe (ZETIA) 10 MG tablet Take 10 mg by mouth daily.   Yes Historical Provider, MD  ferrous fumarate (HEMOCYTE - 106 MG FE) 325 (106 FE) MG TABS tablet Take 1 tablet by mouth daily.   Yes Historical Provider, MD  insulin aspart (NOVOLOG) 100 UNIT/ML injection 0-9 Units, Subcutaneous, 3 times daily with meals CBG < 70: implement hypoglycemia protocol CBG 70 - 120: 0 units CBG 121 - 150: 1 unit CBG 151 - 200: 2 units CBG 201 - 250: 3 units CBG 251 - 300: 5 units CBG 301 - 350: 7 units CBG 351 - 400: 9 units CBG > 400: call MD 06/09/13  Yes  Shanker Levora Dredge, MD  insulin detemir (LEVEMIR) 100 UNIT/ML injection Inject 0.45 mLs (45 Units total) into the skin 2 (two) times daily. 06/06/13  Yes Nishant Dhungel, MD  levothyroxine (SYNTHROID, LEVOTHROID) 75 MCG tablet Take 1 tablet (75 mcg total) by mouth daily before breakfast. 06/07/13  Yes Nishant Dhungel, MD  liothyronine (CYTOMEL) 25 MCG tablet Take 25 mcg by mouth 2 (two) times daily.   Yes Historical Provider, MD  metFORMIN (GLUCOPHAGE) 500 MG tablet Take 500 mg by mouth 2 (two) times daily.    Yes Historical Provider, MD  Multiple Vitamin (MULTIVITAMIN WITH MINERALS) TABS tablet Take 1 tablet by mouth daily.   Yes Historical Provider, MD  Omega-3 Fatty Acids (FISH OIL) 1000 MG CAPS Take 2,000 mg by mouth daily.   Yes Historical Provider, MD  ranitidine (ZANTAC) 75 MG tablet Take 75 mg by mouth 2 (two) times daily.   Yes Historical Provider, MD  simvastatin (ZOCOR) 20 MG tablet Take 1 tablet (20 mg total) by mouth every evening. 06/06/13  Yes Nishant Dhungel, MD  Travoprost, BAK Free, (TRAVATAN) 0.004 % SOLN ophthalmic solution Place 1 drop into both eyes at bedtime.   Yes Historical Provider, MD   Allergies  Allergen Reactions  . Amoxicillin     REACTION: rash  . Ciprofloxacin     REACTION: hives  . Codeine     REACTION: nervous, shaky  . Duloxetine     REACTION: reaction not known  . Hydrochlorothiazide W-Triamterene     REACTION: hyponatremia  . Ticlopidine Hcl     REACTION: bleeding    FAMILY HISTORY:  Family History  Problem Relation Age of Onset  . Hypertension Mother   . Hyperlipidemia Mother   . Hyperlipidemia Father   . Hypertension Father   . Diabetes Father    SOCIAL HISTORY:  reports that she quit smoking about 20 years ago. Her smoking use included Cigarettes. She has a 120 pack-year smoking history. She has never used smokeless tobacco. She reports that she does not drink alcohol or use illicit drugs.  REVIEW OF SYSTEMS:   Unable to  obtain.  SUBJECTIVE:   VITAL SIGNS: Temp:  [97.5 F (36.4 C)-102.9 F (39.4 C)] 102.9 F (39.4 C) (09/25 0825) Pulse Rate:  [96-114] 114 (09/25 0718) Resp:  [18-26] 26 (09/25 0825) BP: (131-180)/(66-100) 165/74 mmHg (09/25 0825) SpO2:  [86 %-96 %] 86 % (09/25 0825) Weight:  [221 lb 6.4 oz (100.426 kg)] 221 lb 6.4 oz (100.426 kg) (09/25 0500)  HEMODYNAMICS:   VENTILATOR SETTINGS:   INTAKE / OUTPUT: Intake/Output     09/24 0701 - 09/25 0700 09/25 0701 - 09/26 0700   P.O. 0  I.V. (mL/kg) 1360 (13.5)    IV Piggyback 300 650   Total Intake(mL/kg) 1660 (16.5) 650 (6.5)   Urine (mL/kg/hr) 5300 (2.2)    Emesis/NG output     Total Output 5300     Net -3640 +650          PHYSICAL EXAMINATION: General: Ill appearing Neuro:  Opens eyes spontaneously, moves Rt upper extremity spontaneously, withdraws Rt leg to stimuli, not much movement Lt side, not following commands HEENT: Neck stiff, no sinus tenderness, poor dentition, no LAN Cardiovascular: regular, tachycardic, 3/6 SM Lungs:  Decreased breath sounds, no wheeze/rales Abdomen:  Soft, non tender, decreased bowel sounds Musculoskeletal:  No edema Skin:  No rashes  LABS:  CBC Recent Labs     07/05/13  0820  07/06/13  0443  07/06/13  0845  WBC  4.4  5.8  5.0  HGB  12.1  13.1  13.0  HCT  34.7*  38.9  38.6  PLT  221  236  229   Coag's No results found for this basename: APTT, INR,  in the last 72 hours  BMET Recent Labs     07/05/13  0420  07/05/13  0820  07/06/13  0443  NA  152*  147*  152*  K  2.6*  2.7*  2.6*  CL  95*  92*  96  CO2  29  27  25   BUN  36*  32*  25*  CREATININE  2.01*  1.65*  1.33*  GLUCOSE  294*  328*  474*   Electrolytes Recent Labs     07/04/13  0810  07/05/13  0420  07/05/13  0820  07/06/13  0443  CALCIUM  8.2*  7.8*  7.8*  7.9*  MG   --    --   1.3*  2.3  PHOS  4.1  3.0   --   2.3   Sepsis Markers No results found for this basename: LACTICACIDVEN, PROCALCITON, O2SATVEN,  in  the last 72 hours  ABG Recent Labs     07/05/13  1607  07/06/13  0850  PHART  7.487*  7.427  PCO2ART  39.3  42.6  PO2ART  69.0*  377.0*   Liver Enzymes Recent Labs     07/04/13  0810  07/05/13  0420  07/06/13  0443  ALBUMIN  2.9*  2.9*  3.2*   Cardiac Enzymes No results found for this basename: TROPONINI, PROBNP,  in the last 72 hours  Glucose Recent Labs     07/04/13  2113  07/05/13  0732  07/05/13  1150  07/05/13  1702  07/05/13  2122  07/06/13  0735  GLUCAP  271*  285*  332*  344*  346*  411*    Imaging Dg Abd 1 View  07/05/2013   CLINICAL DATA:  Nausea and vomiting  EXAM: ABDOMEN - 1 VIEW  COMPARISON:  July 01, 2013  FINDINGS: Normal bowel gas pattern. Negative for bowel obstruction. Lumbar degenerative changes. No acute bony abnormality. Degenerative change in the left hip joint.  IMPRESSION: Nonobstructive bowel gas pattern.   Electronically Signed   By: Marlan Palau M.D.   On: 07/05/2013 09:58   Ct Head Wo Contrast  07/05/2013   CLINICAL DATA:  Altered mental status  EXAM: CT HEAD WITHOUT CONTRAST  TECHNIQUE: Contiguous axial images were obtained from the base of the skull through the vertex without intravenous contrast.  COMPARISON:  MRI 06/08/2013, CT 06/07/2013.  FINDINGS: Moderate atrophy. Negative for hydrocephalus.  Chronic microvascular ischemic change throughout the white matter. Chronic lacunar infarct left globus pallidus. Chronic ischemic change in the pons.  Negative for acute infarct. Negative for hemorrhage or mass. Calvarium is intact.  IMPRESSION: Atrophy and chronic microvascular ischemia. No acute abnormality.   Electronically Signed   By: Marlan Palau M.D.   On: 07/05/2013 15:11   Dg Chest Port 1 View  07/06/2013   CLINICAL DATA:  Fever  EXAM: PORTABLE CHEST - 1 VIEW  COMPARISON:  07/01/2013  FINDINGS: Cardiomediastinal silhouette is stable. Central mild vascular congestion without pulmonary edema. There is poor inspiration with mild basilar  atelectasis. Bony thorax is stable. No segmental infiltrate or pleural effusion.  IMPRESSION: Poor inspiration with mild basilar atelectasis. No segmental infiltrate or pulmonary edema. Central mild vascular congestion.   Electronically Signed   By: Natasha Mead   On: 07/06/2013 08:14    ASSESSMENT / PLAN:  PULMONARY A: Atelectasis. At risk for aspiration >> currently protecting airway. P:   -oxygen to keep SpO2 > 92% -f/u CXR intermittently -at risk for intubation if mental status gets worse  CARDIOVASCULAR A:  Hx of HTN, mod AS, hyperlipidemia. P:  -monitor hemodynamics  RENAL A:   Acute renal failure 2nd to volume depletion, and meds. Hypernatremia. Hypokalemia. P:   -monitor renal fx, urine outpt -f/u and replace electrolytes as needed -IV fluids per renal and primary team  GASTROINTESTINAL A:  Diarrhea 2nd to C diff. P:   -NPO  HEMATOLOGIC A:   No issues. P:  -SCD for DVT prevention -f/u CBC  INFECTIOUS A:   ?menigitis. E coli UTI. C diff colitis. P:   -Abx, decadron per primary team -f/u procalcitonin  ENDOCRINE A:   Hx of DM type II. Hx of hypothyroidism.  P:   -SSI -continue levothyroxine IV  NEUROLOGIC A:   Acute encephalopathy 2nd to renal failure, UTI, C diff, recent CVA, and ?new seizure 9/25. Hx of depression. P:   -neurology consulted -f/u EEG, MRI -may need LP -hold aggrenox, ASA for now  CC time 45 minutes.  Coralyn Helling, MD [facility] Pulmonary/Critical Care 07/06/2013, 9:58 AM Pager:  [phone] After 3pm call: [phone]

## 2013-07-06 NOTE — Progress Notes (Signed)
Received call from CMT--stating pt had 10 beats of V-tach. Pt temp elevated at the time---Pt continues to be lethergic and restless and move around in the bed. Pt has jerky movements at times. Unable to take PO meds. Will continue to monitor.

## 2013-07-06 NOTE — Progress Notes (Signed)
EEG Completed; Results Pending  

## 2013-07-06 NOTE — Progress Notes (Signed)
Inpatient Diabetes Program Recommendations  AACE/ADA: New Consensus Statement on Inpatient Glycemic Control (2013)  Target Ranges:  Prepandial:   less than 140 mg/dL      Peak postprandial:   less than 180 mg/dL (1-2 hours)      Critically ill patients:  140 - 180 mg/dL   Reason for Assessment:  Uncontrolled blood sugars  Transferred to ICU this am due to decline in mental status.  Results for LANYLA, COSTELLO (MRN [id number]) as of 07/06/2013 11:06  Ref. Range 07/05/2013 07:32 07/05/2013 11:50 07/05/2013 17:02 07/05/2013 21:22 07/06/2013 07:35  Glucose-Capillary Latest Range: 70-99 mg/dL 469 (H) 629 (H) 528 (H) 346 (H) 411 (H)   Results for MAKISHA, MARRIN (MRN [id number]) as of 07/06/2013 11:06  Ref. Range 07/06/2013 08:30  Sodium Latest Range: 135-145 mEq/L 156 (H)  Potassium Latest Range: 3.5-5.1 mEq/L 2.6 (LL)  Chloride Latest Range: 96-112 mEq/L 101  CO2 Latest Range: 19-32 mEq/L 26  BUN Latest Range: 6-23 mg/dL 25 (H)  Creatinine Latest Range: 0.50-1.10 mg/dL 2.72 (H)  Calcium Latest Range: 8.4-10.5 mg/dL 7.6 (L)  GFR calc non Af Amer Latest Range: >90 mL/min 38 (L)  GFR calc Af Amer Latest Range: >90 mL/min 44 (L)  Glucose Latest Range: 70-99 mg/dL 536 (H)   Recommendation:  Begin ICU Hyperglycemia Protocol. Order IV Insulin/GlucoStabilizer for uncontrolled DM.  Will follow. Thank you. Ailene Ards, RD, LDN, CDE Inpatient Diabetes Coordinator [phone]

## 2013-07-06 NOTE — Significant Event (Signed)
Rapid Response Event Note  Overview: Called for decline in mental status, lethargy.      Initial Focused Assessment: CBG 411, Temp 102.9 rectal, 12 lead EKG obtained, VS noted, tachycardic and hypertensive, pupils reactive but unable to speak or follow commands, eyes mostly closed. O2 sat 88% on RA.   Interventions: Dr Janee Morn to see Pt, see orders, O2 applied @ 4L [place], see bedside RN interventions.   Event Summary: Transfer to ICU.   at      at          [facility], Ferd Hibbs

## 2013-07-07 LAB — BASIC METABOLIC PANEL
BUN: 23 mg/dL (ref 6–23)
Chloride: 108 mEq/L (ref 96–112)
GFR calc Af Amer: 56 mL/min — ABNORMAL LOW (ref >90)
Glucose, Bld: 407 mg/dL — ABNORMAL HIGH (ref 70–99)
Potassium: 3.8 mEq/L (ref 3.5–5.1)
Sodium: 154 mEq/L — ABNORMAL HIGH (ref 135–145)

## 2013-07-07 LAB — CSF CELL COUNT WITH DIFFERENTIAL
RBC Count, CSF: 100 /mm3 — ABNORMAL HIGH
Tube #: 3
WBC, CSF: 1 /mm3 (ref 0–5)

## 2013-07-07 LAB — CBC WITH DIFFERENTIAL/PLATELET
Basophils Absolute: 0 10*3/uL (ref 0.0–0.1)
HCT: 37 % (ref 36.0–46.0)
Hemoglobin: 12.3 g/dL (ref 12.0–15.0)
Lymphocytes Relative: 20 % (ref 12–46)
Monocytes Absolute: 0.5 10*3/uL (ref 0.1–1.0)
Monocytes Relative: 10 % (ref 3–12)
Neutro Abs: 3.2 10*3/uL (ref 1.7–7.7)
Platelets: 164 10*3/uL (ref 150–400)
RDW: 14.9 % (ref 11.5–15.5)
WBC: 4.6 10*3/uL (ref 4.0–10.5)

## 2013-07-07 LAB — GLUCOSE, CAPILLARY
Glucose-Capillary: 351 mg/dL — ABNORMAL HIGH (ref 70–99)
Glucose-Capillary: 348 mg/dL — ABNORMAL HIGH (ref 70–99)
Glucose-Capillary: 369 mg/dL — ABNORMAL HIGH (ref 70–99)
Glucose-Capillary: 447 mg/dL — ABNORMAL HIGH (ref 70–99)
Glucose-Capillary: 475 mg/dL — ABNORMAL HIGH (ref 70–99)
Glucose-Capillary: 249 mg/dL — ABNORMAL HIGH (ref 70–99)

## 2013-07-07 LAB — URINE CULTURE

## 2013-07-07 LAB — RENAL FUNCTION PANEL
CO2: 32 mEq/L (ref 19–32)
GFR calc Af Amer: 60 mL/min — ABNORMAL LOW (ref >90)
Glucose, Bld: 412 mg/dL — ABNORMAL HIGH (ref 70–99)
Phosphorus: 1.8 mg/dL — ABNORMAL LOW (ref 2.3–4.6)
Potassium: 3.5 mEq/L (ref 3.5–5.1)
Sodium: 151 mEq/L — ABNORMAL HIGH (ref 135–145)

## 2013-07-07 LAB — GRAM STAIN: Gram Stain: NONE SEEN

## 2013-07-07 LAB — PROTEIN AND GLUCOSE, CSF
Glucose, CSF: 235 mg/dL — ABNORMAL HIGH (ref 43–76)
Total  Protein, CSF: 26 mg/dL (ref 15–45)

## 2013-07-07 MED ORDER — FAMOTIDINE IN NACL 20-0.9 MG/50ML-% IV SOLN
20.0000 mg | Freq: Every day | INTRAVENOUS | Status: DC
  Administered 2013-07-07 – 2013-07-13 (×7): 20 mg via INTRAVENOUS
  Filled 2013-07-07 (×7): qty 50

## 2013-07-07 MED ORDER — INSULIN ASPART 100 UNIT/ML ~~LOC~~ SOLN
24.0000 [IU] | Freq: Once | SUBCUTANEOUS | Status: AC
  Administered 2013-07-07: 24 [IU] via SUBCUTANEOUS

## 2013-07-07 MED ORDER — POTASSIUM CHLORIDE 10 MEQ/100ML IV SOLN
10.0000 meq | INTRAVENOUS | Status: DC
  Filled 2013-07-07: qty 100

## 2013-07-07 MED ORDER — VANCOMYCIN HCL IN DEXTROSE 750-5 MG/150ML-% IV SOLN
750.0000 mg | Freq: Two times a day (BID) | INTRAVENOUS | Status: DC
  Administered 2013-07-07 – 2013-07-12 (×11): 750 mg via INTRAVENOUS
  Filled 2013-07-07 (×12): qty 150

## 2013-07-07 MED ORDER — METOPROLOL TARTRATE 1 MG/ML IV SOLN
10.0000 mg | Freq: Three times a day (TID) | INTRAVENOUS | Status: DC
  Administered 2013-07-07 – 2013-07-08 (×3): 10 mg via INTRAVENOUS
  Filled 2013-07-07 (×5): qty 10

## 2013-07-07 MED ORDER — INSULIN DETEMIR 100 UNIT/ML ~~LOC~~ SOLN
35.0000 [IU] | Freq: Two times a day (BID) | SUBCUTANEOUS | Status: DC
  Administered 2013-07-07: 35 [IU] via SUBCUTANEOUS
  Filled 2013-07-07 (×2): qty 0.35

## 2013-07-07 MED ORDER — METOPROLOL TARTRATE 1 MG/ML IV SOLN
7.5000 mg | Freq: Three times a day (TID) | INTRAVENOUS | Status: DC
  Administered 2013-07-07: 7.5 mg via INTRAVENOUS

## 2013-07-07 MED ORDER — SODIUM PHOSPHATE 3 MMOLE/ML IV SOLN
30.0000 mmol | Freq: Once | INTRAVENOUS | Status: AC
  Administered 2013-07-07: 30 mmol via INTRAVENOUS
  Filled 2013-07-07: qty 10

## 2013-07-07 MED ORDER — METOPROLOL TARTRATE 1 MG/ML IV SOLN: 7.5000 mg | Freq: Three times a day (TID) | INTRAVENOUS | Status: DC

## 2013-07-07 MED ORDER — POTASSIUM CHLORIDE 10 MEQ/100ML IV SOLN
10.0000 meq | INTRAVENOUS | Status: AC
  Administered 2013-07-07 (×2): 10 meq via INTRAVENOUS
  Filled 2013-07-07: qty 100

## 2013-07-07 MED ORDER — INSULIN ASPART 100 UNIT/ML ~~LOC~~ SOLN
26.0000 [IU] | Freq: Once | SUBCUTANEOUS | Status: AC
  Administered 2013-07-07: 26 [IU] via SUBCUTANEOUS

## 2013-07-07 MED ORDER — FAMOTIDINE 20 MG PO TABS
20.0000 mg | ORAL_TABLET | Freq: Every day | ORAL | Status: DC
  Filled 2013-07-07: qty 1

## 2013-07-07 MED ORDER — BIOTENE DRY MOUTH MT LIQD
15.0000 mL | Freq: Two times a day (BID) | OROMUCOSAL | Status: DC
  Administered 2013-07-07 – 2013-07-22 (×31): 15 mL via OROMUCOSAL

## 2013-07-07 MED ORDER — CHLORHEXIDINE GLUCONATE 0.12 % MT SOLN
15.0000 mL | Freq: Two times a day (BID) | OROMUCOSAL | Status: DC
  Administered 2013-07-07 – 2013-07-22 (×30): 15 mL via OROMUCOSAL
  Filled 2013-07-07 (×33): qty 15

## 2013-07-07 MED ORDER — LIDOCAINE HCL 1 % IJ SOLN
INTRAMUSCULAR | Status: AC
  Administered 2013-07-07: 3 mL
  Filled 2013-07-07: qty 20

## 2013-07-07 MED ORDER — INSULIN DETEMIR 100 UNIT/ML ~~LOC~~ SOLN
5.0000 [IU] | Freq: Once | SUBCUTANEOUS | Status: AC
  Administered 2013-07-07: 5 [IU] via SUBCUTANEOUS
  Filled 2013-07-07: qty 0.05

## 2013-07-07 MED ORDER — INSULIN DETEMIR 100 UNIT/ML ~~LOC~~ SOLN
30.0000 [IU] | Freq: Two times a day (BID) | SUBCUTANEOUS | Status: DC
  Administered 2013-07-07: 30 [IU] via SUBCUTANEOUS
  Filled 2013-07-07: qty 0.3

## 2013-07-07 NOTE — Progress Notes (Signed)
NUTRITION FOLLOW-UP  DOCUMENTATION CODES Per approved criteria  -Severe malnutrition in the context of chronic illness -Morbid Obesity   INTERVENTION: If/when appropriate to resume diet, recommend least restrictive diet to promote variety in meal choices. Continue Glucerna Shake po BID, each supplement provides 220 kcal and 10 grams of protein, once no longer NPO. If oral intake does not improve, recommend nutrition support in setting of ongoing malnutrition if within GOC. RD to continue to follow nutrition care plan.  NUTRITION DIAGNOSIS: Inadequate oral intake related to poor appetite as evidenced by ongoing weight loss and poor meal completion. Ongoing.  Goal: Intake to meet >90% of estimated nutrition needs. Unmet.  Monitor:  weight trends, lab trends, I/O's, PO intake, supplement tolerance  ASSESSMENT: PMHx significant for DM2, retinopathy, legal blindness, HTN, CVA, hypothyroidism, anxiety and depression. Noted pt with 2 recent admissions to [facility] within the past month. Pt was d/c'd to rehab for about 3 weeks after hospitalization. Pt developed diarrhea, poor appetite, and overall decline x 9 days at [facility]. Family thought decline was 2/2 lack of care at SNF, pt was then sent home. Remains weak and bed bound. Reported weight loss of 25 lb x 1 month.  C diff positive. Transferred to ICU on 9/25 for fever and possible seizure activity. Per CCM, pt is at risk for aspiration and may need intubation of mental status worsens. Pt made a DNR/DNI.  Neuro consulted given decline in mental status. EEG completed - findings reveal abnormality c/w diffuse cerebral disturbance that is likely metabolic in nature. No evidence of non-convulsive seizure activity. Possibly getting a LP today.  Sodium is elevated at 151. Potassium and Magnesium WNL. Phosphorus is low at 1.8.  Pt with 5.4% wt loss x 1 month per EPIC weight hx. Pt meets criteria for severe MALNUTRITION in the context of chronic illness as  evidenced by intake of <75% x at least 1 month and wt loss of >5% x 1 month.  Height: Ht Readings from Last 1 Encounters:  07/01/13 5\' 2"  (1.575 m)    Weight: Wt Readings from Last 1 Encounters:  07/07/13 183 lb 13.8 oz (83.4 kg)  Admit wt 220 lb wt decreasing with fluid status and poor oral intake  BMI:  Body mass index is 33.62 kg/(m^2). Obese Class II  Estimated Nutritional Needs: Kcal: 1500 - 1700 Protein: 95 - 105 g Fluid: 1.2 liters  Skin: intact  Diet Order: NPO   EDUCATION NEEDS: -No education needs identified at this time   Intake/Output Summary (Last 24 hours) at 07/07/13 1113 Last data filed at 07/07/13 0835  Gross per 24 hour  Intake   2773 ml  Output   2335 ml  Net    438 ml    Last BM: 9/25  Labs:   Recent Labs Lab 07/05/13 0420 07/05/13 0820 07/06/13 0443 07/06/13 0830 07/07/13 0335  NA 152* 147* 152* 156* 151*  K 2.6* 2.7* 2.6* 2.6* 3.5  CL 95* 92* 96 101 106  CO2 29 27 25 26  32  BUN 36* 32* 25* 25* 23  CREATININE 2.01* 1.65* 1.33* 1.38* 1.07  CALCIUM 7.8* 7.8* 7.9* 7.6* 7.8*  MG  --  1.3* 2.3  --   --   PHOS 3.0  --  2.3  --  1.8*  GLUCOSE 294* 328* 474* 453* 412*    CBG (last 3)   Recent Labs  07/05/13 1702 07/05/13 2122 07/06/13 0735  GLUCAP 344* 346* 411*    Scheduled Meds: . acyclovir  500 mg Intravenous Q12H  . ampicillin (OMNIPEN) IV  2 g Intravenous Q4H  . antiseptic oral rinse  15 mL Mouth Rinse q12n4p  . cefTRIAXone (ROCEPHIN)  IV  2 g Intravenous Q12H  . chlorhexidine  15 mL Mouth Rinse BID  . dexamethasone  10 mg Intravenous Q6H  . famotidine (PEPCID) IV  20 mg Intravenous Q24H  . insulin aspart  0-20 Units Subcutaneous TID WC  . insulin detemir  30 Units Subcutaneous BID  . latanoprost  1 drop Both Eyes QHS  . levETIRAcetam  500 mg Intravenous Q12H  . levothyroxine  37.5 mcg Intravenous Daily  . liothyronine  25 mcg Oral BID  . metoprolol  5 mg Intravenous Q8H  . metronidazole  500 mg Intravenous Q8H  .  potassium chloride  10 mEq Intravenous Q1 Hr x 2  . simvastatin  20 mg Oral QPM  . sodium chloride  3 mL Intravenous Q12H  . sodium phosphate  Dextrose 5% IVPB  30 mmol Intravenous Once  . vancomycin  750 mg Intravenous Q12H    Continuous Infusions: . dextrose 5 % 1,000 mL with potassium chloride 40 mEq infusion 100 mL/hr at 07/07/13 0043    Jarold Motto MS, RD, LDN Pager: [phone] After-hours pager: [phone]

## 2013-07-07 NOTE — Procedures (Signed)
Lumbar Puncture Procedure Note  Pre-operative Diagnosis: r/o meningitis, enceph    Post-operative Diagnosis: r/o enceph   Indications: Diagnostic  Procedure Details    Consent: Informed consent was obtained. Risks of the procedure were discussed including: infection, bleeding, pain and headache.  The patient was positioned under sterile conditions. Betadine solution and sterile drapes were utilized. A spinal needle was inserted at the L3 - L4 interspace.  Spinal fluid was obtained and sent to the laboratory.  Findings 8mL of clear spinal fluid was obtained. Opening Pressure: normal cm H2O pressure. Closing Pressure: normal cm H2O pressure.  Complications:  None; patient tolerated the procedure well.        Condition: stable  Plan Bed rest for 5 hours. Mcarthur Rossetti. Tyson Alias, MD, FACP Pgr: [phone] [place] [facility]

## 2013-07-07 NOTE — Progress Notes (Signed)
PULMONARY  / CRITICAL CARE MEDICINE  Name: Regina [name] MRN: [id number] DOB: [birth year]    ADMISSION DATE:  07/01/2013 CONSULTATION DATE: 07/06/2013  REFERRING MD :  Ramiro Harvest  CHIEF COMPLAINT:  Change in mental status  BRIEF PATIENT DESCRIPTION:  72 yo female admitted with generalized weakness, and found to have UTI, diarrhea and acute renal failure.  Found to have C diff.  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  SIGNIFICANT EVENTS: 9/20 Admit 9/21 nephrology consulted 9/24 Tm 101.4 9/25 Transfer to ICU, fever, ?seizure activity, neurology consulted, DNR established  STUDIES:  MRI brain 8/27 >> small acute lacunar infarct in medial Rt thalamus, ? Punctate acute lacunar infarct central pons Renal u/s 9/20 >> no hydronephrosis CT head 9/24 >> atrophy and chronic microvascular ischemia EEG 9/25 >> abnormal c/w diffuse cerebral disturbance that is likely metabolic in nature.  No evidence of non-convulsive seizure activity. MRI 9/25 >> no acute abnormality, images degraded by pt motion.  LINES / TUBES: PIV  CULTURES: Urine 9/20 >> E coli Stool GI pathogen >> C diff Positive Blood 9/24 >>  Urine 9/25 >> yeast Blood 9/25 >>  ANTIBIOTICS: Rocephin 9/20 >> 9/23 Rocephin 9/25 >> Flagyl 9/22 >> Vancomycin 9/25 >>  Ampicillin 9/25 >>  Acyclovir 9/26 >>   SUBJECTIVE:  Pt's daughter reports no improvement from yesterday.  States that pt has had less RUE movement/twitching but is unsure whether this is related to the fact that pt received Ativan overnight or not.  RN reports no changes.  No additional ativan since yesterday afternoon.  VITAL SIGNS: Temp:  [98 F (36.7 C)-99.1 F (37.3 C)] 98 F (36.7 C) (09/26 0832) Pulse Rate:  [71-100] 86 (09/26 0600) Resp:  [11-19] 12 (09/26 0832) BP: (135-190)/(53-84) 190/83 mmHg (09/26 0832) SpO2:  [94 %-100 %] 95 % (09/26 0832) Weight:  [183 lb 13.8 oz (83.4 kg)-201 lb 8 oz (91.4 kg)] 183 lb 13.8 oz (83.4 kg) (09/26 0400) 2  liters Candelaria Arenas  INTAKE / OUTPUT: Intake/Output     09/25 0701 - 09/26 0700 09/26 0701 - 09/27 0700   I.V. (mL/kg) 1710 (20.5)    IV Piggyback 1813    Total Intake(mL/kg) 3523 (42.2)    Urine (mL/kg/hr) 2135 (1.1) 200 (1)   Total Output 2135 200   Net +1388 -200        Stool Occurrence 1 x      PHYSICAL EXAMINATION: General: Ill appearing.  Arousable Neuro:  Opens eyes spontaneously, arousable, moves RUE spontaneously.  Withdraws to painful stimuli with LUE and LLE.  No obvious movement/withdrawal to stimuli of RLE.  Does not follow commands. HEENT: Mild neck stiffness, no sinus tenderness, no LAN Cardiovascular: RRR,  3/6 systolic murmur that radiates into carotids. Lungs:  Decreased breath sounds bilaterally, no W/R/R. Abdomen:  Decreased BS x 4 quadrants.  Abdomen is soft, non-tender, non-distended. Musculoskeletal:  No edema.  SCD's in place. Skin:  No rashes  LABS:  CBC Recent Labs     07/06/13  0443  07/06/13  0845  07/07/13  0335  WBC  5.8  5.0  4.6  HGB  13.1  13.0  12.3  HCT  38.9  38.6  37.0  PLT  236  229  164   Coag's No results found for this basename: APTT, INR,  in the last 72 hours  BMET Recent Labs     07/06/13  0443  07/06/13  0830  07/07/13  0335  NA  152*  156*  151*  K  2.6*  2.6*  3.5  CL  96  101  106  CO2  25  26  32  BUN  25*  25*  23  CREATININE  1.33*  1.38*  1.07  GLUCOSE  474*  453*  412*   Electrolytes Recent Labs     07/05/13  0420  07/05/13  0820  07/06/13  0443  07/06/13  0830  07/07/13  0335  CALCIUM  7.8*  7.8*  7.9*  7.6*  7.8*  MG   --   1.3*  2.3   --    --   PHOS  3.0   --   2.3   --   1.8*   Sepsis Markers Recent Labs     07/06/13  0845  07/07/13  0335  PROCALCITON  <0.10  <0.10    ABG Recent Labs     07/05/13  1607  07/06/13  0850  PHART  7.487*  7.427  PCO2ART  39.3  42.6  PO2ART  69.0*  377.0*   Liver Enzymes Recent Labs     07/06/13  0443  07/06/13  0830  07/07/13  0335  AST   --   14   --    ALT   --   10   --   ALKPHOS   --   67   --   BILITOT   --   0.3   --   ALBUMIN  3.2*  3.0*  2.8*   Cardiac Enzymes No results found for this basename: TROPONINI, PROBNP,  in the last 72 hours  Glucose Recent Labs     07/04/13  2113  07/05/13  0732  07/05/13  1150  07/05/13  1702  07/05/13  2122  07/06/13  0735  GLUCAP  271*  285*  332*  344*  346*  411*    Imaging Dg Abd 1 View  07/05/2013   CLINICAL DATA:  Nausea and vomiting  EXAM: ABDOMEN - 1 VIEW  COMPARISON:  July 01, 2013  FINDINGS: Normal bowel gas pattern. Negative for bowel obstruction. Lumbar degenerative changes. No acute bony abnormality. Degenerative change in the left hip joint.  IMPRESSION: Nonobstructive bowel gas pattern.   Electronically Signed   By: Marlan Palau M.D.   On: 07/05/2013 09:58   Ct Head Wo Contrast  07/05/2013   CLINICAL DATA:  Altered mental status  EXAM: CT HEAD WITHOUT CONTRAST  TECHNIQUE: Contiguous axial images were obtained from the base of the skull through the vertex without intravenous contrast.  COMPARISON:  MRI 06/08/2013, CT 06/07/2013.  FINDINGS: Moderate atrophy. Negative for hydrocephalus. Chronic microvascular ischemic change throughout the white matter. Chronic lacunar infarct left globus pallidus. Chronic ischemic change in the pons.  Negative for acute infarct. Negative for hemorrhage or mass. Calvarium is intact.  IMPRESSION: Atrophy and chronic microvascular ischemia. No acute abnormality.   Electronically Signed   By: Marlan Palau M.D.   On: 07/05/2013 15:11   Mr Brain Wo Contrast  07/06/2013   CLINICAL DATA:  Altered mental status  EXAM: MRI HEAD WITHOUT CONTRAST  TECHNIQUE: Multiplanar, multisequence MR imaging was performed. No intravenous contrast was administered.  COMPARISON:  MRI 06/08/2013  FINDINGS: Image quality degraded by motion.  Negative for acute infarct. Diffusion-weighted imaging is diagnostic.  Generalized atrophy. Chronic microvascular ischemic changes in  the white matter. Chronic infarcts in the basal ganglia and pons bilaterally. No hemorrhage or mass. No fluid collection.  Paranasal  sinuses are clear.  IMPRESSION: Image quality degraded by motion. No acute abnormality.   Electronically Signed   By: Marlan Palau M.D.   On: 07/06/2013 13:34   Dg Chest Port 1 View  07/06/2013   CLINICAL DATA:  Fever  EXAM: PORTABLE CHEST - 1 VIEW  COMPARISON:  07/01/2013  FINDINGS: Cardiomediastinal silhouette is stable. Central mild vascular congestion without pulmonary edema. There is poor inspiration with mild basilar atelectasis. Bony thorax is stable. No segmental infiltrate or pleural effusion.  IMPRESSION: Poor inspiration with mild basilar atelectasis. No segmental infiltrate or pulmonary edema. Central mild vascular congestion.   Electronically Signed   By: Natasha Mead   On: 07/06/2013 08:14    ASSESSMENT / PLAN:  PULMONARY A: Atelectasis. At risk for aspiration >> currently protecting airway. DNR/DNI. P:   -oxygen to keep SpO2 > 92% -f/u CXR intermittently  CARDIOVASCULAR A:  Hx of HTN, mod AS, hyperlipidemia. P:  -monitor hemodynamics  RENAL A:   Acute renal failure 2nd to volume depletion, and meds. Hypernatremia. Hypokalemia. P:   -Renal function improved today.  Continue to monitor BMP and urine outpt -f/u and replace electrolytes as needed -IV fluids per renal and primary team  GASTROINTESTINAL A:  Diarrhea 2nd to C diff. Nutrition. P:   -NPO -pepcid for SUP  HEMATOLOGIC A:   No issues. P:  -SCD for DVT prevention -f/u CBC  INFECTIOUS A:   ?menigitis. E coli UTI. C diff colitis - procalcitonin normal, no evidence of sepsis P:   -Abx, decadron per primary team and neurology  ENDOCRINE A:   Hx of DM type II. Hx of hypothyroidism.  Hyperglycemia P:   -SSI -Levemir increased per primary team -continue levothyroxine IV  NEUROLOGIC A:   Acute encephalopathy 2nd to renal failure, UTI, C diff, recent CVA, and  ?new seizure 9/25. Hx of depression. P:   -neurology consulted -Possible LP today, will defer decision to neuro -hold aggrenox, ASA for now  Rutherford Guys, PA - S  Reviewed above, and examined.  No acute findings on MRI brain, and non-specific findings on EEG.  ?still of causes for mental status change >> likely multifactorial related to UTI, C diff colitis, renal failure, hypernatremia, and ?menigitis.  Pt otherwise hemodynamically stable, and not requiring significant amount of supplemental oxygen.  She is now DNR/DNI.  PCCM can be available to assist with lumbar puncture if needed >> otherwise PCCM will sign off.  Updated pt's daughter about plan, and discussed with Dr. Janee Morn.  Coralyn Helling, MD [facility] Pulmonary/Critical Care 07/07/2013, 11:07 AM Pager:  [phone] After 3pm call: [phone]

## 2013-07-07 NOTE — Progress Notes (Signed)
Subjective: Patient essentially unchanged clinically.  No further fevers since yesterday.  No further seizures noted as well..  Patient remains on Keppra.    Objective: Current vital signs: BP 190/83  Pulse 86  Temp(Src) 98 F (36.7 C) (Oral)  Resp 12  Ht 5\' 2"  (1.575 m)  Wt 83.4 kg (183 lb 13.8 oz)  BMI 33.62 kg/m2  SpO2 95% Vital signs in last 24 hours: Temp:  [98 F (36.7 C)-99.1 F (37.3 C)] 98 F (36.7 C) (09/26 0832) Pulse Rate:  [71-100] 86 (09/26 0600) Resp:  [11-19] 12 (09/26 0832) BP: (135-190)/(53-84) 190/83 mmHg (09/26 0832) SpO2:  [94 %-100 %] 95 % (09/26 0832) Weight:  [83.4 kg (183 lb 13.8 oz)-91.4 kg (201 lb 8 oz)] 83.4 kg (183 lb 13.8 oz) (09/26 0400)  Intake/Output from previous day: 09/25 0701 - 09/26 0700 In: 3523 [I.V.:1710; IV Piggyback:1813] Out: 2135 [Urine:2135] Intake/Output this shift: Total I/O In: -  Out: 200 [Urine:200] Nutritional status: NPO  Neurologic Exam: Mental Status:  Seems to open eyes when her name is called. With deep sternal rub localizes to pain with both upper extremities (right greater than left). Does not follow commands. No verbalizations noted.  Cranial Nerves:  II: patient does not respond confrontation bilaterally, pupils right 3 mm, left 3 mm,and reactive bilaterally.  III,IV,VI: Patient resists turning head and therefore oculocephalic maneuvers can not be performed. Looks preferentially to the right. Can not get patient to go beyond midline.  V,VII: corneal reflex reduced bilaterally  VIII: patient does not respond to verbal stimuli  IX,X: gag reflex reduced, XI: trapezius strength unable to test bilaterally but patient does move head from side-to-side spontaneously  XII: tongue strength unable to test  Motor:  Moves all extremities but moves the right greater than the left. Moves the right upper extremity spontaneously above her head.  Sensory:  Respond to noxious stimuli in all extremities.  Deep Tendon Reflexes:   2+ in the upper extremities and 1+ in the lower extremities  Plantars:  downgoing on the right and mute on the left   Lab Results: Basic Metabolic Panel:  Recent Labs Lab 07/03/13 0535 07/04/13 0810 07/05/13 0420 07/05/13 0820 07/06/13 0443 07/06/13 0830 07/07/13 0335 07/07/13 1352  NA 134* 139 152* 147* 152* 156* 151* 154*  K 3.6 3.6 2.6* 2.7* 2.6* 2.6* 3.5 3.8  CL 94* 85* 95* 92* 96 101 106 108  CO2 17* 17* 29 27 25 26  32 35*  GLUCOSE 306* 452* 294* 328* 474* 453* 412* 407*  BUN 78* 58* 36* 32* 25* 25* 23 23  CREATININE 6.81* 3.57* 2.01* 1.65* 1.33* 1.38* 1.07 1.12*  CALCIUM 7.8* 8.2* 7.8* 7.8* 7.9* 7.6* 7.8* 7.8*  MG  --   --   --  1.3* 2.3  --   --   --   PHOS 4.6 4.1 3.0  --  2.3  --  1.8*  --     Liver Function Tests:  Recent Labs Lab 07/01/13 1134  07/04/13 0810 07/05/13 0420 07/06/13 0443 07/06/13 0830 07/07/13 0335  AST 14  --   --   --   --  14  --   ALT 12  --   --   --   --  10  --   ALKPHOS 66  --   --   --   --  67  --   BILITOT 0.4  --   --   --   --  0.3  --  PROT 6.5  --   --   --   --  6.4  --   ALBUMIN 3.2*  < > 2.9* 2.9* 3.2* 3.0* 2.8*  < > = values in this interval not displayed. No results found for this basename: LIPASE, AMYLASE,  in the last 168 hours No results found for this basename: AMMONIA,  in the last 168 hours  CBC:  Recent Labs Lab 07/01/13 1134  07/03/13 0535 07/05/13 0820 07/06/13 0443 07/06/13 0845 07/07/13 0335  WBC 7.1  < > 4.9 4.4 5.8 5.0 4.6  NEUTROABS 4.7  --   --   --   --  2.9 3.2  HGB 12.4  < > 10.4* 12.1 13.1 13.0 12.3  HCT 35.3*  < > 29.7* 34.7* 38.9 38.6 37.0  MCV 88.5  < > 87.6 88.7 92.0 92.8 93.2  PLT 167  < > 175 221 236 229 164  < > = values in this interval not displayed.  Cardiac Enzymes:  Recent Labs Lab 07/01/13 1134  CKTOTAL 38  TROPONINI <0.30    Lipid Panel: No results found for this basename: CHOL, TRIG, HDL, CHOLHDL, VLDL, LDLCALC,  in the last 168 hours  CBG:  Recent  Labs Lab 07/07/13 0333 07/07/13 0810 07/07/13 1118 07/07/13 1311 07/07/13 1312  GLUCAP 369* 447* 414* 475* 386*    Microbiology: Results for orders placed during the hospital encounter of 07/01/13  URINE CULTURE     Status: None   Collection Time    07/01/13 12:37 PM      Result Value Range Status   Specimen Description URINE, CATHETERIZED   Final   Special Requests NONE   Final   Culture  Setup Time     Final   Value: 07/01/2013 20:06     Performed at [facility] Count     Final   Value: >=100,000 COLONIES/ML     Performed at [facility]   Culture     Final   Value: ESCHERICHIA COLI     Performed at [facility]   Report Status 07/03/2013 FINAL   Final   Organism ID, Bacteria ESCHERICHIA COLI   Final  CULTURE, BLOOD (ROUTINE X 2)     Status: None   Collection Time    07/05/13 11:20 PM      Result Value Range Status   Specimen Description BLOOD LEFT HAND   Final   Special Requests BOTTLES DRAWN AEROBIC AND ANAEROBIC 3CC   Final   Culture  Setup Time     Final   Value: 07/06/2013 04:42     Performed at [facility]   Culture     Final   Value:        BLOOD CULTURE RECEIVED NO GROWTH TO DATE CULTURE WILL BE HELD FOR 5 DAYS BEFORE ISSUING A FINAL NEGATIVE REPORT     Performed at [facility]   Report Status PENDING   Incomplete  CULTURE, BLOOD (ROUTINE X 2)     Status: None   Collection Time    07/05/13 11:24 PM      Result Value Range Status   Specimen Description BLOOD RIGHT ANTECUBITAL   Final   Special Requests BOTTLES DRAWN AEROBIC AND ANAEROBIC 5CC   Final   Culture  Setup Time     Final   Value: 07/06/2013 04:42     Performed at [facility]   Culture     Final  Value:        BLOOD CULTURE RECEIVED NO GROWTH TO DATE CULTURE WILL BE HELD FOR 5 DAYS BEFORE ISSUING A FINAL NEGATIVE REPORT     Performed at [facility]   Report Status PENDING   Incomplete  URINE CULTURE     Status: None    Collection Time    07/06/13  8:01 AM      Result Value Range Status   Specimen Description URINE, CATHETERIZED   Final   Special Requests NONE   Final   Culture  Setup Time     Final   Value: 07/06/2013 10:49     Performed at [facility] Count     Final   Value: 40,000 COLONIES/ML     Performed at [facility]   Culture     Final   Value: YEAST     Performed at [facility]   Report Status 07/07/2013 FINAL   Final  CULTURE, BLOOD (SINGLE)     Status: None   Collection Time    07/06/13  8:45 AM      Result Value Range Status   Specimen Description BLOOD RIGHT ARM   Final   Special Requests BOTTLES DRAWN AEROBIC ONLY 3CC    Final   Culture  Setup Time     Final   Value: 07/06/2013 10:49     Performed at [facility]   Culture     Final   Value:        BLOOD CULTURE RECEIVED NO GROWTH TO DATE CULTURE WILL BE HELD FOR 5 DAYS BEFORE ISSUING A FINAL NEGATIVE REPORT     Performed at [facility]   Report Status PENDING   Incomplete  MRSA PCR SCREENING     Status: None   Collection Time    07/06/13  9:25 AM      Result Value Range Status   MRSA by PCR NEGATIVE  NEGATIVE Final   Comment:            The GeneXpert MRSA Assay (FDA     approved for NASAL specimens     only), is one component of a     comprehensive MRSA colonization     surveillance program. It is not     intended to diagnose MRSA     infection nor to guide or     monitor treatment for     MRSA infections.    Coagulation Studies: No results found for this basename: LABPROT, INR,  in the last 72 hours  Imaging: Ct Head Wo Contrast  07/05/2013   CLINICAL DATA:  Altered mental status  EXAM: CT HEAD WITHOUT CONTRAST  TECHNIQUE: Contiguous axial images were obtained from the base of the skull through the vertex without intravenous contrast.  COMPARISON:  MRI 06/08/2013, CT 06/07/2013.  FINDINGS: Moderate atrophy. Negative for hydrocephalus. Chronic microvascular  ischemic change throughout the white matter. Chronic lacunar infarct left globus pallidus. Chronic ischemic change in the pons.  Negative for acute infarct. Negative for hemorrhage or mass. Calvarium is intact.  IMPRESSION: Atrophy and chronic microvascular ischemia. No acute abnormality.   Electronically Signed   By: Marlan Palau M.D.   On: 07/05/2013 15:11   Mr Brain Wo Contrast  07/06/2013   CLINICAL DATA:  Altered mental status  EXAM: MRI HEAD WITHOUT CONTRAST  TECHNIQUE: Multiplanar, multisequence MR imaging was performed. No intravenous contrast was administered.  COMPARISON:  MRI 06/08/2013  FINDINGS: Image quality degraded by motion.  Negative for acute infarct. Diffusion-weighted imaging is diagnostic.  Generalized atrophy. Chronic microvascular ischemic changes in the white matter. Chronic infarcts in the basal ganglia and pons bilaterally. No hemorrhage or mass. No fluid collection.  Paranasal sinuses are clear.  IMPRESSION: Image quality degraded by motion. No acute abnormality.   Electronically Signed   By: Marlan Palau M.D.   On: 07/06/2013 13:34   Dg Chest Port 1 View  07/06/2013   CLINICAL DATA:  Fever  EXAM: PORTABLE CHEST - 1 VIEW  COMPARISON:  07/01/2013  FINDINGS: Cardiomediastinal silhouette is stable. Central mild vascular congestion without pulmonary edema. There is poor inspiration with mild basilar atelectasis. Bony thorax is stable. No segmental infiltrate or pleural effusion.  IMPRESSION: Poor inspiration with mild basilar atelectasis. No segmental infiltrate or pulmonary edema. Central mild vascular congestion.   Electronically Signed   By: Natasha Mead   On: 07/06/2013 08:14    Medications:  I have reviewed the patient's current medications. Scheduled: . acyclovir  500 mg Intravenous Q12H  . ampicillin (OMNIPEN) IV  2 g Intravenous Q4H  . antiseptic oral rinse  15 mL Mouth Rinse q12n4p  . cefTRIAXone (ROCEPHIN)  IV  2 g Intravenous Q12H  . chlorhexidine  15 mL Mouth  Rinse BID  . dexamethasone  10 mg Intravenous Q6H  . famotidine (PEPCID) IV  20 mg Intravenous Daily  . insulin aspart  0-20 Units Subcutaneous TID WC  . insulin detemir  35 Units Subcutaneous BID  . latanoprost  1 drop Both Eyes QHS  . levETIRAcetam  500 mg Intravenous Q12H  . levothyroxine  37.5 mcg Intravenous Daily  . liothyronine  25 mcg Oral BID  . metoprolol  7.5 mg Intravenous Q8H  . metronidazole  500 mg Intravenous Q8H  . simvastatin  20 mg Oral QPM  . sodium chloride  3 mL Intravenous Q12H  . vancomycin  750 mg Intravenous Q12H    Assessment/Plan: Patient remains altered.  Metabolic issues remain a concern.  Can not rule out possible CNS infection as well.  Patient on broad spectrum antibiotics and Acyclovir started yesterday.  Herpes encephalitis remains in the differential particularly with development of seizures and some focal findings seen on EEG.  MRI of the brain reviewed and shows no acute changes.  Recommendations: 1.  LP today with CSF to be sent for routine studies and HSV PCR 2.  Continue antibiotics 3.  Continue Keppra at current dose   LOS: 6 days   Thana Farr, MD [facility] [phone] 07/07/2013  2:33 PM

## 2013-07-07 NOTE — Progress Notes (Signed)
ANTIBIOTIC CONSULT NOTE - follow up  Pharmacy Consult for Vancomycin and Acyclovir Indication: r/o meningitis  Allergies  Allergen Reactions  . Amoxicillin     REACTION: rash  . Ciprofloxacin     REACTION: hives  . Codeine     REACTION: nervous, shaky  . Duloxetine     REACTION: reaction not known  . Hydrochlorothiazide W-Triamterene     REACTION: hyponatremia  . Ticlopidine Hcl     REACTION: bleeding    Patient Measurements: Height: 5\' 2"  (157.5 cm) Weight: 183 lb 13.8 oz (83.4 kg) IBW/kg (Calculated) : 50.1  Vital Signs: Temp: 98.3 F (36.8 C) (09/26 0400) Temp src: Oral (09/26 0400) BP: 151/76 mmHg (09/26 0600) Pulse Rate: 86 (09/26 0600) Intake/Output from previous day: 09/25 0701 - 09/26 0700 In: 3523 [I.V.:1710; IV Piggyback:1813] Out: 2135 [Urine:2135] Intake/Output from this shift:    Labs:  Recent Labs  07/06/13 0443 07/06/13 0830 07/06/13 0845 07/07/13 0335  WBC 5.8  --  5.0 4.6  HGB 13.1  --  13.0 12.3  PLT 236  --  229 164  CREATININE 1.33* 1.38*  --  1.07   Estimated Creatinine Clearance: 49 ml/min (by C-G formula based on Cr of 1.07). No results found for this basename: Rolm Gala, VANCORANDOM, GENTTROUGH, GENTPEAK, GENTRANDOM, TOBRATROUGH, TOBRAPEAK, TOBRARND, AMIKACINPEAK, AMIKACINTROU, AMIKACIN,  in the last 72 hours    Assessment: 72 yo F presented 9/20 with c/o generalized weakness and lethargy. Patient found acidotic and ARF (Scr 7.83). Rocephin was started for UTI, renal function improved through hospitalization but no improvement in mental status seen. Pt febrile 9//24, with nuchal rigidity, RUE jerking and worsening mental status. CT head neg, MRI head and EEG pending. Patient transfer to ICU 9/25. Decadron, Vanc, Rocephin, Ampicillin order for r/o meningitis. Unable to do LP given Aggrenox and Lovenox. Neuro, CCM consulted.   9/20 >> Rocephin >> 9/23 9/23 >> Flagyl (MD) >>  9/25 >> Vanc >> 9/25 >> Rocephin (MD) >> 9/25  >> Ampicillin (MD) >> 9/25 >> Acyclovir >>  Tmax: AF WBCs: wnl (on Decadron)  Renal: ARF improved to 1.07, 49CG, 55N.  PCT = negative  9/20 Urine: E.coli - pan sens 9/21 Stool: +C.diff toxins 9/21 C.diff PCR: canceled. 9/24 Blood x 2: ngtd 9/25: MRSA PCR: neg 9/25 Urine: sent 9/25 Blood x 1: ngtd  9/26: D4 Flagyl(MD) 500 IV q8h for CDAD, D2 Vanc 2g x 1 then 1500 q24h, Ampicillin 2g q6h, Rocephin 2g q12h for r/o meningitis and UTI. Also D2 Decadron for possible complications(seizures, hearing loss), recommend 2-4 days. D1 Acyclovir 500 IV q12h r/o herpes encephalitis. Weight has fluctuated. Down 8.4 kg from 9/25. Acyclovir used IBW at 10mg /kg IV q12h (500mg  IV q12h).  Goal of Therapy:  Vancomycin trough level 15-20 mcg/ml  Plan:   Daily doses of Vanc haven't started yet and renal fxn is improved, so change Vanc to 750mg  IV q12h.  Continue Acyclovir 500mg  IV q12h. F/u renal fxn and consider change to q8h tomorrow.  Continue Ampicillin, Rocephin, and Flagyl as ordered by MD.  Would continue Decadron for 2-4 days given possible complications (seizures, hearing loss)  Charolotte Eke, PharmD, pager [phone]. 07/07/2013,8:34 AM.

## 2013-07-07 NOTE — Progress Notes (Signed)
Elink MD called to ensure sodium phosphate is intended order.  MD notified of NA, K, and Phos levels this am. Na Phos will be administered per MD order.

## 2013-07-07 NOTE — Progress Notes (Signed)
Physical Therapy Discharge Patient Details Name: Regina [name] MRN: [id number] DOB: December 25, 1940 Today's Date: 07/07/2013 Time:  -     Patient discharged from PT services secondary to medical decline - will need to re-order PT to resume therapy services.  Please see latest therapy progress note for current level of functioning and progress toward goals.    Pt obtunded and hasn't been able to participate in PTx3 days 2* medical complications. Please re-order when pt able to participate in PT.   GP     Tamala Ser 07/07/2013, 12:39 PM  [phone]

## 2013-07-07 NOTE — Progress Notes (Signed)
CSW following for discharge planning - patient has a bed @ [facility] when medically ready. CSW has completed FL2 & will continue to follow and assist with return.    Unice Bailey, LCSW [facility] Clinical Social Worker cell #: [phone]

## 2013-07-07 NOTE — Progress Notes (Signed)
[facility] PROGRESS NOTE  Regina [name] JYN:[id number] DOB: 1941-10-04 DOA: 07/01/2013 PCP: Michiel Sites, MD  Assessment/Plan: #1 acute encephalopathy Questionable etiology. May be secondary to seizures in the setting of probable bacterial meningitis with patient with nuchal rigidity and a rectal temperature of 102 with altered mental status. Per nursing patient with right upper extremity jerking motions 2 nights ago. No significant improvement in mental status.  CT of the head was negative. MRI of the head is negative for any acute infarct. EEG is negative for any epileptiform discharges. Patient was hypokalemic yesterday with potassium of 2.6. Potassium today is 3.5. Keep potassium greater than 4.  Placed on Ativan 1 mg IV every 2 hours when necessary. Neuro checks every 4 hours. Unable to do a lumbar puncture as patient has been on Aggrenox and Lovenox which have been discontinued. Repeat blood cultures pending. Replete electrolytes. Continue empirically on IV vancomycin, Rocephin, ampicillin, acyclovir, Decadron. Continue IV Keppra. Neurology following and appreciate input and recommendations.  #2 probable seizure Patient noted to have jerking motion of the right upper extremity mostly throughout the night  2 days ago in addition to altered mental status. Patient's eyes rolled and deviated to the right. Patient also noted to have a fever. CT of the head was negative. MRI of the head was negative. EEG was negative. Significant improvement in her jerking in the right upper extremity. Patient was loaded with IV Keppra and is on Keppra IV every 12 hours. Cultures are pending. Continue empiric IV vancomycin, Rocephin, ampicillin, acyclovir and Decadron for possible meningitis. Neuro checks. Neurology following and appreciate input and recommendations.   #3 possible meningitis/CSF infection  Patient noted to have a rectal temperature 102 yesterday. Patient with significant nuchal rigidity.  Patient with altered mental status. Blood cultures are pending. Unable to perform the LP yesterday as patient was on Aggrenox and Lovenox. MRI head negative. EEG negative. Continue to hold Aggrenox and Lovenox for now. No improvement with mental status. Will defer LP to neurology. Continue IV vancomycin, Rocephin, ampicillin, acyclovir and Decadron to cover empirically for CSF infection. Neurology following and appreciate your input and recommendations.  #4 acute renal failure Likely secondary to volume depletion and hypotension in the setting of ARB. Clinical improvement. Renal function with creatinine now of 1.07. Good urine output with 2135 cc over the past 24 hours. Continue Foley catheter. Bicarbonate drip has been discontinued as acidosis has resolved. Continue to hold ARB and metformin.   #5 hypokalemia/hypomagnesemia Likely secondary to GI losses from C. difficile colitis and increased urinary output. Replete.   #6 C. difficile colitis On IV Flagyl. Follow.  #7 hypernatremia Sodium levels starting to trend down. Continue D5W 100 cc per hour. Follow.  #8 Escherichia coli UTI Patient had received IV Rocephin x3 days. D/C IV Rocephin 2 days ago. Resumed IV Rocephin in light of concern for bacterial meningitis.  #9 hypertension Increase IV Lopressor to 7.5mg  q8.  #10 history of CVA Hold Aggrenox, for possible lumbar puncture today. Will defer to neurology.  #11 diabetes mellitus Hemoglobin A1c is 9.6. CBGs have ranged from 332-411. Increase Levemir to 35 units subcutaneous twice a day. Continue sliding scale insulin.  #12 hypothyroidism Continue Synthroid and Cytomel.  #13 anxiety/depression Stable.  #14 prophylaxis  Pepcid for GI prophylaxis. SCDs for DVT prophylaxis.  Code Status: Full Family Communication: Updated at bedside. Disposition Plan: Remain in ICU.   Consultants:  Nephrology: Dr. Eliott Nine 07/02/2013:  Neurology : Dr Thad Ranger 07/06/13  Procedures:  CT  head 07/05/2013  KUB 07/05/2013  KUB 07/01/2013  Chest x-ray 07/01/2013  MRI head 07/06/13  EEG 07/06/13  Antibiotics:  IV Rocephin 07/01/2013--> 07/05/2013  IV Flagyl 07/04/2013-  IV vancomycin 07/06/2013  IV Rocephin 07/06/2013  IV ampicillin 07/06/2013  IV Acyclovir 07/06/13  HPI/Subjective: Patient is lethargic. Patient minimally responsive to verbal and noxious stimuli. Per nursing patient with right upper extremity jerking motions improved.  No significant improvement with mental status.   Objective: Filed Vitals:   07/07/13 0832  BP: 190/83  Pulse:   Temp: 98 F (36.7 C)  Resp: 12    Intake/Output Summary (Last 24 hours) at 07/07/13 1126 Last data filed at 07/07/13 0835  Gross per 24 hour  Intake   2773 ml  Output   2335 ml  Net    438 ml   Filed Weights   07/06/13 1000 07/06/13 1500 07/07/13 0400  Weight: 89.3 kg (196 lb 13.9 oz) 91.4 kg (201 lb 8 oz) 83.4 kg (183 lb 13.8 oz)    Exam:   General:  Lehargic. Minimally responsive.   Neck: Rigid neck  Cardiovascular: Tachycardia  Respiratory: CTAB anterior lung fields.  Abdomen: Soft/NT/ND/+BS  Musculoskeletal: No c/c/e  Neuro: Patient is lethargic not responding to verbal stimuli. Pupils minimally responsive to light. Neck rigidity.  Data Reviewed: Basic Metabolic Panel:  Recent Labs Lab 07/03/13 0535 07/04/13 0810 07/05/13 0420 07/05/13 0820 07/06/13 0443 07/06/13 0830 07/07/13 0335  NA 134* 139 152* 147* 152* 156* 151*  K 3.6 3.6 2.6* 2.7* 2.6* 2.6* 3.5  CL 94* 85* 95* 92* 96 101 106  CO2 17* 17* 29 27 25 26  32  GLUCOSE 306* 452* 294* 328* 474* 453* 412*  BUN 78* 58* 36* 32* 25* 25* 23  CREATININE 6.81* 3.57* 2.01* 1.65* 1.33* 1.38* 1.07  CALCIUM 7.8* 8.2* 7.8* 7.8* 7.9* 7.6* 7.8*  MG  --   --   --  1.3* 2.3  --   --   PHOS 4.6 4.1 3.0  --  2.3  --  1.8*   Liver Function Tests:  Recent Labs Lab 07/01/13 1134  07/04/13 0810 07/05/13 0420 07/06/13 0443 07/06/13 0830  07/07/13 0335  AST 14  --   --   --   --  14  --   ALT 12  --   --   --   --  10  --   ALKPHOS 66  --   --   --   --  67  --   BILITOT 0.4  --   --   --   --  0.3  --   PROT 6.5  --   --   --   --  6.4  --   ALBUMIN 3.2*  < > 2.9* 2.9* 3.2* 3.0* 2.8*  < > = values in this interval not displayed. No results found for this basename: LIPASE, AMYLASE,  in the last 168 hours No results found for this basename: AMMONIA,  in the last 168 hours CBC:  Recent Labs Lab 07/01/13 1134  07/03/13 0535 07/05/13 0820 07/06/13 0443 07/06/13 0845 07/07/13 0335  WBC 7.1  < > 4.9 4.4 5.8 5.0 4.6  NEUTROABS 4.7  --   --   --   --  2.9 3.2  HGB 12.4  < > 10.4* 12.1 13.1 13.0 12.3  HCT 35.3*  < > 29.7* 34.7* 38.9 38.6 37.0  MCV 88.5  < > 87.6 88.7 92.0 92.8 93.2  PLT 167  < >  175 221 236 229 164  < > = values in this interval not displayed. Cardiac Enzymes:  Recent Labs Lab 07/01/13 1134  CKTOTAL 38  TROPONINI <0.30   BNP (last 3 results) No results found for this basename: PROBNP,  in the last 8760 hours CBG:  Recent Labs Lab 07/05/13 0732 07/05/13 1150 07/05/13 1702 07/05/13 2122 07/06/13 0735  GLUCAP 285* 332* 344* 346* 411*    Recent Results (from the past 240 hour(s))  URINE CULTURE     Status: None   Collection Time    07/01/13 12:37 PM      Result Value Range Status   Specimen Description URINE, CATHETERIZED   Final   Special Requests NONE   Final   Culture  Setup Time     Final   Value: 07/01/2013 20:06     Performed at [facility] Count     Final   Value: >=100,000 COLONIES/ML     Performed at [facility]   Culture     Final   Value: ESCHERICHIA COLI     Performed at [facility]   Report Status 07/03/2013 FINAL   Final   Organism ID, Bacteria ESCHERICHIA COLI   Final  CULTURE, BLOOD (ROUTINE X 2)     Status: None   Collection Time    07/05/13 11:20 PM      Result Value Range Status   Specimen Description BLOOD LEFT HAND    Final   Special Requests BOTTLES DRAWN AEROBIC AND ANAEROBIC 3CC   Final   Culture  Setup Time     Final   Value: 07/06/2013 04:42     Performed at [facility]   Culture     Final   Value:        BLOOD CULTURE RECEIVED NO GROWTH TO DATE CULTURE WILL BE HELD FOR 5 DAYS BEFORE ISSUING A FINAL NEGATIVE REPORT     Performed at [facility]   Report Status PENDING   Incomplete  CULTURE, BLOOD (ROUTINE X 2)     Status: None   Collection Time    07/05/13 11:24 PM      Result Value Range Status   Specimen Description BLOOD RIGHT ANTECUBITAL   Final   Special Requests BOTTLES DRAWN AEROBIC AND ANAEROBIC 5CC   Final   Culture  Setup Time     Final   Value: 07/06/2013 04:42     Performed at [facility]   Culture     Final   Value:        BLOOD CULTURE RECEIVED NO GROWTH TO DATE CULTURE WILL BE HELD FOR 5 DAYS BEFORE ISSUING A FINAL NEGATIVE REPORT     Performed at [facility]   Report Status PENDING   Incomplete  URINE CULTURE     Status: None   Collection Time    07/06/13  8:01 AM      Result Value Range Status   Specimen Description URINE, CATHETERIZED   Final   Special Requests NONE   Final   Culture  Setup Time     Final   Value: 07/06/2013 10:49     Performed at [facility] Count     Final   Value: 40,000 COLONIES/ML     Performed at [facility]   Culture     Final   Value: YEAST     Performed at [facility]  Report Status 07/07/2013 FINAL   Final  CULTURE, BLOOD (SINGLE)     Status: None   Collection Time    07/06/13  8:45 AM      Result Value Range Status   Specimen Description BLOOD RIGHT ARM   Final   Special Requests BOTTLES DRAWN AEROBIC ONLY 3CC    Final   Culture  Setup Time     Final   Value: 07/06/2013 10:49     Performed at [facility]   Culture     Final   Value:        BLOOD CULTURE RECEIVED NO GROWTH TO DATE CULTURE WILL BE HELD FOR 5 DAYS BEFORE ISSUING A FINAL NEGATIVE REPORT      Performed at [facility]   Report Status PENDING   Incomplete  MRSA PCR SCREENING     Status: None   Collection Time    07/06/13  9:25 AM      Result Value Range Status   MRSA by PCR NEGATIVE  NEGATIVE Final   Comment:            The GeneXpert MRSA Assay (FDA     approved for NASAL specimens     only), is one component of a     comprehensive MRSA colonization     surveillance program. It is not     intended to diagnose MRSA     infection nor to guide or     monitor treatment for     MRSA infections.     Studies: Ct Head Wo Contrast  07/05/2013   CLINICAL DATA:  Altered mental status  EXAM: CT HEAD WITHOUT CONTRAST  TECHNIQUE: Contiguous axial images were obtained from the base of the skull through the vertex without intravenous contrast.  COMPARISON:  MRI 06/08/2013, CT 06/07/2013.  FINDINGS: Moderate atrophy. Negative for hydrocephalus. Chronic microvascular ischemic change throughout the white matter. Chronic lacunar infarct left globus pallidus. Chronic ischemic change in the pons.  Negative for acute infarct. Negative for hemorrhage or mass. Calvarium is intact.  IMPRESSION: Atrophy and chronic microvascular ischemia. No acute abnormality.   Electronically Signed   By: Marlan Palau M.D.   On: 07/05/2013 15:11   Mr Brain Wo Contrast  07/06/2013   CLINICAL DATA:  Altered mental status  EXAM: MRI HEAD WITHOUT CONTRAST  TECHNIQUE: Multiplanar, multisequence MR imaging was performed. No intravenous contrast was administered.  COMPARISON:  MRI 06/08/2013  FINDINGS: Image quality degraded by motion.  Negative for acute infarct. Diffusion-weighted imaging is diagnostic.  Generalized atrophy. Chronic microvascular ischemic changes in the white matter. Chronic infarcts in the basal ganglia and pons bilaterally. No hemorrhage or mass. No fluid collection.  Paranasal sinuses are clear.  IMPRESSION: Image quality degraded by motion. No acute abnormality.   Electronically Signed   By:  Marlan Palau M.D.   On: 07/06/2013 13:34   Dg Chest Port 1 View  07/06/2013   CLINICAL DATA:  Fever  EXAM: PORTABLE CHEST - 1 VIEW  COMPARISON:  07/01/2013  FINDINGS: Cardiomediastinal silhouette is stable. Central mild vascular congestion without pulmonary edema. There is poor inspiration with mild basilar atelectasis. Bony thorax is stable. No segmental infiltrate or pleural effusion.  IMPRESSION: Poor inspiration with mild basilar atelectasis. No segmental infiltrate or pulmonary edema. Central mild vascular congestion.   Electronically Signed   By: Natasha Mead   On: 07/06/2013 08:14    Scheduled Meds: . acyclovir  500 mg Intravenous Q12H  . ampicillin (  OMNIPEN) IV  2 g Intravenous Q4H  . antiseptic oral rinse  15 mL Mouth Rinse q12n4p  . cefTRIAXone (ROCEPHIN)  IV  2 g Intravenous Q12H  . chlorhexidine  15 mL Mouth Rinse BID  . dexamethasone  10 mg Intravenous Q6H  . famotidine (PEPCID) IV  20 mg Intravenous Daily  . insulin aspart  0-20 Units Subcutaneous TID WC  . insulin aspart  26 Units Subcutaneous Once  . insulin detemir  35 Units Subcutaneous BID  . latanoprost  1 drop Both Eyes QHS  . levETIRAcetam  500 mg Intravenous Q12H  . levothyroxine  37.5 mcg Intravenous Daily  . liothyronine  25 mcg Oral BID  . metoprolol  7.5 mg Intravenous Q8H  . metronidazole  500 mg Intravenous Q8H  . potassium chloride  10 mEq Intravenous Q1 Hr x 2  . simvastatin  20 mg Oral QPM  . sodium chloride  3 mL Intravenous Q12H  . sodium phosphate  Dextrose 5% IVPB  30 mmol Intravenous Once  . vancomycin  750 mg Intravenous Q12H   Continuous Infusions: . dextrose 5 % 1,000 mL with potassium chloride 40 mEq infusion 100 mL/hr at 07/07/13 0043    Principal Problem:   Acute encephalopathy Active Problems:   HYPOTHYROIDISM   DIABETES MELLITUS, TYPE II   HYPERLIPIDEMIA   UTI (urinary tract infection)   HTN (hypertension)   Anxiety and depression   CVA (cerebral infarction)   Weakness  generalized   Dehydration   Acute renal failure   Failure to thrive   Diarrhea   Hyponatremia   Metabolic acidosis   Clostridium difficile colitis   Hypomagnesemia   Hypernatremia   Seizures   Meningitis, unspecified(322.9)    Time spent: > 35 mins    [facility]  [facility] Pager [phone]. If 7PM-7AM, please contact night-coverage at [email or web address], password [facility] 07/07/2013, 11:26 AM  LOS: 6 days

## 2013-07-07 NOTE — Progress Notes (Signed)
Inpatient Diabetes Program Recommendations  AACE/ADA: New Consensus Statement on Inpatient Glycemic Control (2013)  Target Ranges:  Prepandial:   less than 140 mg/dL      Peak postprandial:   less than 180 mg/dL (1-2 hours)      Critically ill patients:  140 - 180 mg/dL   Reason for Assessment:  Hyperglycemia  Results for Regina [name], Regina [name] (MRN [id number]) as of 07/07/2013 11:32  Ref. Range 07/05/2013 07:32 07/05/2013 11:50 07/05/2013 17:02 07/05/2013 21:22 07/06/2013 07:35  Glucose-Capillary Latest Range: 70-99 mg/dL 409 (H) 811 (H) 914 (H) 346 (H) 411 (H)  Results for [name], OFFNER (MRN [id number]) as of 07/07/2013 11:32  Ref. Range 07/06/2013 08:30 07/07/2013 03:35  Sodium Latest Range: 135-145 mEq/L 156 (H) 151 (H)  Potassium Latest Range: 3.5-5.1 mEq/L 2.6 (LL) 3.5  Chloride Latest Range: 96-112 mEq/L 101 106  CO2 Latest Range: 19-32 mEq/L 26 32  BUN Latest Range: 6-23 mg/dL 25 (H) 23  Creatinine Latest Range: 0.50-1.10 mg/dL 0.86 (H) 5.78  Calcium Latest Range: 8.4-10.5 mg/dL 7.6 (L) 7.8 (L)  GFR calc non Af Amer Latest Range: >90 mL/min 38 (L) 51 (L)  GFR calc Af Amer Latest Range: >90 mL/min 44 (L) 60 (L)  Glucose Latest Range: 70-99 mg/dL 469 (H) 629 (H)   Blood sugars remain elevated.  Please consider ICU Hyperglycemia Protocol to control blood sugars.  Thank you. Ailene Ards, RD, LDN, CDE Inpatient Diabetes Coordinator [phone]

## 2013-07-07 NOTE — Progress Notes (Signed)
eLink Physician-Brief Progress Note Patient Name: Regina [name] DOB: Mar 25, 1941 MRN: [id number]  Date of Service  07/07/2013   HPI/Events of Note   Hypophosphatemia  eICU Interventions  Phos replaced   Intervention Category Intermediate Interventions: Electrolyte abnormality - evaluation and management  DETERDING,ELIZABETH 07/07/2013, 4:45 AM

## 2013-07-08 LAB — RENAL FUNCTION PANEL
Albumin: 2.7 g/dL — ABNORMAL LOW (ref 3.5–5.2)
CO2: 33 mEq/L — ABNORMAL HIGH (ref 19–32)
Calcium: 7.5 mg/dL — ABNORMAL LOW (ref 8.4–10.5)
Chloride: 109 mEq/L (ref 96–112)
GFR calc Af Amer: 70 mL/min — ABNORMAL LOW (ref >90)
GFR calc non Af Amer: 60 mL/min — ABNORMAL LOW (ref >90)
Potassium: 3.8 mEq/L (ref 3.5–5.1)
Sodium: 153 mEq/L — ABNORMAL HIGH (ref 135–145)

## 2013-07-08 LAB — PHOSPHORUS: Phosphorus: 2.7 mg/dL (ref 2.3–4.6)

## 2013-07-08 LAB — GLUCOSE, CAPILLARY
Glucose-Capillary: 336 mg/dL — ABNORMAL HIGH (ref 70–99)
Glucose-Capillary: 257 mg/dL — ABNORMAL HIGH (ref 70–99)

## 2013-07-08 LAB — CBC
HCT: 37.4 % (ref 36.0–46.0)
MCHC: 33.2 g/dL (ref 30.0–36.0)
MCV: 94 fL (ref 78.0–100.0)
Platelets: 132 10*3/uL — ABNORMAL LOW (ref 150–400)
RDW: 14.3 % (ref 11.5–15.5)
WBC: 5 10*3/uL (ref 4.0–10.5)

## 2013-07-08 LAB — BASIC METABOLIC PANEL
CO2: 32 mEq/L (ref 19–32)
Calcium: 7.4 mg/dL — ABNORMAL LOW (ref 8.4–10.5)
GFR calc Af Amer: 75 mL/min — ABNORMAL LOW (ref >90)
GFR calc non Af Amer: 65 mL/min — ABNORMAL LOW (ref >90)
Potassium: 4.2 mEq/L (ref 3.5–5.1)
Sodium: 146 mEq/L — ABNORMAL HIGH (ref 135–145)

## 2013-07-08 MED ORDER — ACYCLOVIR SODIUM 50 MG/ML IV SOLN
500.0000 mg | Freq: Three times a day (TID) | INTRAVENOUS | Status: DC
  Administered 2013-07-08 – 2013-07-12 (×12): 500 mg via INTRAVENOUS
  Filled 2013-07-08 (×14): qty 10

## 2013-07-08 MED ORDER — INSULIN DETEMIR 100 UNIT/ML ~~LOC~~ SOLN
5.0000 [IU] | Freq: Once | SUBCUTANEOUS | Status: AC
  Administered 2013-07-08: 5 [IU] via SUBCUTANEOUS
  Filled 2013-07-08: qty 0.05

## 2013-07-08 MED ORDER — INSULIN ASPART 100 UNIT/ML ~~LOC~~ SOLN
0.0000 [IU] | SUBCUTANEOUS | Status: DC
  Administered 2013-07-08 – 2013-07-09 (×2): 11 [IU] via SUBCUTANEOUS
  Administered 2013-07-09 (×2): 7 [IU] via SUBCUTANEOUS
  Administered 2013-07-09: 11 [IU] via SUBCUTANEOUS
  Administered 2013-07-09: 4 [IU] via SUBCUTANEOUS
  Administered 2013-07-09: 11 [IU] via SUBCUTANEOUS
  Administered 2013-07-10 (×4): 4 [IU] via SUBCUTANEOUS
  Administered 2013-07-10: 11 [IU] via SUBCUTANEOUS
  Administered 2013-07-10: 7 [IU] via SUBCUTANEOUS
  Administered 2013-07-11 (×2): 4 [IU] via SUBCUTANEOUS
  Administered 2013-07-11: 3 [IU] via SUBCUTANEOUS
  Administered 2013-07-11 – 2013-07-12 (×3): 4 [IU] via SUBCUTANEOUS
  Administered 2013-07-12: 01:00:00 11 [IU] via SUBCUTANEOUS
  Administered 2013-07-12 (×2): 7 [IU] via SUBCUTANEOUS
  Administered 2013-07-13 – 2013-07-14 (×6): 4 [IU] via SUBCUTANEOUS
  Administered 2013-07-14 (×2): 3 [IU] via SUBCUTANEOUS
  Administered 2013-07-14: 7 [IU] via SUBCUTANEOUS
  Administered 2013-07-14 – 2013-07-15 (×3): 3 [IU] via SUBCUTANEOUS
  Administered 2013-07-15: 21:00:00 via SUBCUTANEOUS
  Administered 2013-07-15 (×2): 3 [IU] via SUBCUTANEOUS
  Administered 2013-07-15: 4 [IU] via SUBCUTANEOUS
  Administered 2013-07-16: 3 [IU] via SUBCUTANEOUS
  Administered 2013-07-16 (×2): 4 [IU] via SUBCUTANEOUS
  Administered 2013-07-16: 3 [IU] via SUBCUTANEOUS
  Administered 2013-07-16: 4 [IU] via SUBCUTANEOUS
  Administered 2013-07-16: 3 [IU] via SUBCUTANEOUS
  Administered 2013-07-17: 7 [IU] via SUBCUTANEOUS
  Administered 2013-07-17: 11 [IU] via SUBCUTANEOUS
  Administered 2013-07-17 (×3): 3 [IU] via SUBCUTANEOUS
  Administered 2013-07-18 (×2): 4 [IU] via SUBCUTANEOUS
  Administered 2013-07-19: 3 [IU] via SUBCUTANEOUS
  Administered 2013-07-19: 7 [IU] via SUBCUTANEOUS
  Administered 2013-07-19: 1 [IU] via SUBCUTANEOUS
  Administered 2013-07-19: 4 [IU] via SUBCUTANEOUS
  Administered 2013-07-19: 11 [IU] via SUBCUTANEOUS
  Administered 2013-07-19 – 2013-07-20 (×3): 4 [IU] via SUBCUTANEOUS
  Administered 2013-07-20: 3 [IU] via SUBCUTANEOUS
  Administered 2013-07-21: 7 [IU] via SUBCUTANEOUS
  Administered 2013-07-21: 4 [IU] via SUBCUTANEOUS
  Administered 2013-07-22: 7 [IU] via SUBCUTANEOUS
  Administered 2013-07-22: 3 [IU] via SUBCUTANEOUS

## 2013-07-08 MED ORDER — SODIUM CHLORIDE 0.45 % IV BOLUS
500.0000 mL | Freq: Once | INTRAVENOUS | Status: AC
  Administered 2013-07-08: 250 mL via INTRAVENOUS

## 2013-07-08 MED ORDER — DEXTROSE 5 % IV SOLN
INTRAVENOUS | Status: DC
  Administered 2013-07-08 – 2013-07-09 (×2): via INTRAVENOUS

## 2013-07-08 MED ORDER — METOPROLOL TARTRATE 1 MG/ML IV SOLN
10.0000 mg | Freq: Four times a day (QID) | INTRAVENOUS | Status: DC
  Administered 2013-07-08 – 2013-07-11 (×8): 10 mg via INTRAVENOUS
  Filled 2013-07-08 (×16): qty 10

## 2013-07-08 MED ORDER — METOPROLOL TARTRATE 1 MG/ML IV SOLN
INTRAVENOUS | Status: AC
  Filled 2013-07-08: qty 5

## 2013-07-08 MED ORDER — INSULIN DETEMIR 100 UNIT/ML ~~LOC~~ SOLN
45.0000 [IU] | Freq: Two times a day (BID) | SUBCUTANEOUS | Status: DC
  Administered 2013-07-08: 45 [IU] via SUBCUTANEOUS
  Filled 2013-07-08 (×2): qty 0.45

## 2013-07-08 MED ORDER — INSULIN DETEMIR 100 UNIT/ML ~~LOC~~ SOLN: 40.0000 [IU] | Freq: Two times a day (BID) | SUBCUTANEOUS | Status: DC

## 2013-07-08 MED ORDER — INSULIN DETEMIR 100 UNIT/ML ~~LOC~~ SOLN
40.0000 [IU] | Freq: Two times a day (BID) | SUBCUTANEOUS | Status: DC
  Administered 2013-07-08: 40 [IU] via SUBCUTANEOUS
  Filled 2013-07-08: qty 0.4

## 2013-07-08 MED ORDER — MAGNESIUM SULFATE 40 MG/ML IJ SOLN
4.0000 g | Freq: Once | INTRAMUSCULAR | Status: AC
  Administered 2013-07-08: 4 g via INTRAVENOUS
  Filled 2013-07-08: qty 100

## 2013-07-08 NOTE — Progress Notes (Signed)
ANTIBIOTIC CONSULT NOTE - follow up  Pharmacy Consult for Vancomycin and Acyclovir Indication: r/o meningitis  Allergies  Allergen Reactions  . Amoxicillin     REACTION: rash  . Ciprofloxacin     REACTION: hives  . Codeine     REACTION: nervous, shaky  . Duloxetine     REACTION: reaction not known  . Hydrochlorothiazide W-Triamterene     REACTION: hyponatremia  . Ticlopidine Hcl     REACTION: bleeding    Patient Measurements: Height: 5\' 2"  (157.5 cm) Weight: 196 lb 10.4 oz (89.2 kg) IBW/kg (Calculated) : 50.1  Vital Signs: Temp: 98.9 F (37.2 C) (09/27 0800) Temp src: Oral (09/27 0800) BP: 193/88 mmHg (09/27 0800) Pulse Rate: 81 (09/27 0800) Intake/Output from previous day: 09/26 0701 - 09/27 0700 In: 3535 [I.V.:2320; IV Piggyback:1215] Out: 2415 [Urine:2415] Intake/Output from this shift: Total I/O In: 725 [I.V.:225; IV Piggyback:500] Out: 350 [Urine:350]  Labs:  Recent Labs  07/06/13 0845 07/07/13 0335 07/07/13 1352 07/08/13 0340  WBC 5.0 4.6  --  5.0  HGB 13.0 12.3  --  12.4  PLT 229 164  --  132*  CREATININE  --  1.07 1.12* 0.94   Estimated Creatinine Clearance: 57.8 ml/min (by C-G formula based on Cr of 0.94). No results found for this basename: Rolm Gala, VANCORANDOM, GENTTROUGH, GENTPEAK, GENTRANDOM, TOBRATROUGH, TOBRAPEAK, TOBRARND, AMIKACINPEAK, AMIKACINTROU, AMIKACIN,  in the last 72 hours    Assessment: 72 yo F presented 9/20 with c/o generalized weakness and lethargy. Patient found acidotic and ARF (Scr 7.83). Rocephin was started for UTI, renal function improved through hospitalization but no improvement in mental status seen. Pt febrile 9//24, with nuchal rigidity, RUE jerking and worsening mental status. CT head neg, MRI head and EEG pending. Patient transfer to ICU 9/25. Decadron, Vanc, Rocephin, Ampicillin order for r/o meningitis. Unable to do LP given Aggrenox and Lovenox. Neuro, CCM consulted.   9/20 >> Rocephin >>  9/23 9/23 >> Flagyl (MD) >>  9/25 >> Vanc >> 9/25 >> Rocephin (MD) >> 9/25 >> Ampicillin (MD) >> 9/25 >> Acyclovir >>  Tmax: AF WBCs: wnl (on Decadron)  Renal: ARF resolved, 58CG, 63N.  PCT = negative  9/20 Urine: E.coli - pan sens 9/21 Stool: +C.diff toxins 9/21 C.diff PCR: canceled. 9/24 Blood x 2: ngtd 9/25: MRSA PCR: neg 9/25 Urine: sent 9/25 Blood x 1: ngtd 9/26: CSF: negative gram stain  9/27: D5 Flagyl(MD) 500 IV q8h for CDAD, D3 Vanc 2g x 1 then 750 q12h, Ampicillin 2g q4h, Rocephin 2g q12h for r/o meningitis and UTI. Also D3 Decadron for seen complications(seizures, hearing loss), recommend 2-4 days. D2 Acyclovir 500 IV q12h r/o herpes encephalitis(used IBW at 10mg /kg).  Goal of Therapy:  Vancomycin trough level 15-20 mcg/ml  Plan:   Cont Vanc 750mg  IV q12h. Check VT tomorrow.  Change Acyclovir 500mg  IV q12h to q8h.  Continue Ampicillin, Rocephin, and Flagyl as ordered by MD.  Charolotte Eke, PharmD, pager [phone]. 07/08/2013,11:19 AM.

## 2013-07-08 NOTE — Progress Notes (Signed)
Subjective: Patient slightly more alert today.  Still does not talk.  No further seizures noted.   Objective: Current vital signs: BP 170/64  Pulse 65  Temp(Src) 98.4 F (36.9 C) (Oral)  Resp 13  Ht 5\' 2"  (1.575 m)  Wt 89.2 kg (196 lb 10.4 oz)  BMI 35.96 kg/m2  SpO2 95% Vital signs in last 24 hours: Temp:  [97.7 F (36.5 C)-98.9 F (37.2 C)] 98.4 F (36.9 C) (09/27 1200) Pulse Rate:  [65-91] 65 (09/27 1600) Resp:  [12-15] 13 (09/27 1600) BP: (148-193)/(57-88) 170/64 mmHg (09/27 1600) SpO2:  [92 %-98 %] 95 % (09/27 1600) Weight:  [89.2 kg (196 lb 10.4 oz)] 89.2 kg (196 lb 10.4 oz) (09/27 0400)  Intake/Output from previous day: 09/26 0701 - 09/27 0700 In: 3535 [I.V.:2320; IV Piggyback:1215] Out: 2415 [Urine:2415] Intake/Output this shift: Total I/O In: 2225 [I.V.:1125; IV Piggyback:1100] Out: 1100 [Urine:1100] Nutritional status: NPO  Neurologic Exam: Subjective:  Patient essentially unchanged clinically. No further fevers since yesterday. No further seizures noted as well.. Patient remains on Keppra.  Objective:  Current vital signs:  BP 190/83  Pulse 86  Temp(Src) 98 F (36.7 C) (Oral)  Resp 12  Ht 5\' 2"  (1.575 m)  Wt 83.4 kg (183 lb 13.8 oz)  BMI 33.62 kg/m2  SpO2 95%  Vital signs in last 24 hours:  Temp: [98 F (36.7 C)-99.1 F (37.3 C)] 98 F (36.7 C) (09/26 0832)  Pulse Rate: [71-100] 86 (09/26 0600)  Resp: [11-19] 12 (09/26 0832)  BP: (135-190)/(53-84) 190/83 mmHg (09/26 0832)  SpO2: [94 %-100 %] 95 % (09/26 0832)  Weight: [83.4 kg (183 lb 13.8 oz)-91.4 kg (201 lb 8 oz)] 83.4 kg (183 lb 13.8 oz) (09/26 0400)  Intake/Output from previous day:  09/25 0701 - 09/26 0700  In: 3523 [I.V.:1710; IV Piggyback:1813]  Out: 2135 [Urine:2135]  Intake/Output this shift:  Total I/O  In: -  Out: 200 [Urine:200]  Nutritional status: NPO  Neurologic Exam:  Mental Status:  Open eyes when her name is called. With deep sternal rub localizes to pain with both  upper extremities (right greater than left). Does not follow commands. No verbalizations noted.  Cranial Nerves:  II: Seems to focus on examiner, pupils right 3 mm, left 3 mm,and reactive bilaterally.  III,IV,VI: Patient resists turning head and therefore oculocephalic maneuvers can not be performed. Looks preferentially to the right. Can not get patient to go beyond midline.  V,VII: corneal reflex reduced bilaterally, ?right facial droop VIII: patient does not respond to verbal stimuli  IX,X: gag reflex reduced, XI: trapezius strength unable to test bilaterally but patient does move head from side-to-side spontaneously  XII: tongue strength unable to test  Motor:  Moves all extremities but moves the right greater than the left. Moves the right upper extremity spontaneously above her head.  Sensory:  Respond to noxious stimuli in all extremities.  Deep Tendon Reflexes:  2+ in the upper extremities and 1+ in the lower extremities  Plantars:  downgoing on the right and mute on the left     Lab Results: Basic Metabolic Panel:  Recent Labs Lab 07/04/13 0810 07/05/13 0420 07/05/13 0820 07/06/13 0443 07/06/13 0830 07/07/13 0335 07/07/13 1352 07/08/13 0340 07/08/13 1327  NA 139 152* 147* 152* 156* 151* 154* 153* 146*  K 3.6 2.6* 2.7* 2.6* 2.6* 3.5 3.8 3.8 4.2  CL 85* 95* 92* 96 101 106 108 109 103  CO2 17* 29 27 25 26  32 35* 33* 32  GLUCOSE  452* 294* 328* 474* 453* 412* 407* 365* 300*  BUN 58* 36* 32* 25* 25* 23 23 18 17   CREATININE 3.57* 2.01* 1.65* 1.33* 1.38* 1.07 1.12* 0.94 0.88  CALCIUM 8.2* 7.8* 7.8* 7.9* 7.6* 7.8* 7.8* 7.5* 7.4*  MG  --   --  1.3* 2.3  --   --   --  1.2*  --   PHOS 4.1 3.0  --  2.3  --  1.8*  --  2.8  2.7  --     Liver Function Tests:  Recent Labs Lab 07/05/13 0420 07/06/13 0443 07/06/13 0830 07/07/13 0335 07/08/13 0340  AST  --   --  14  --   --   ALT  --   --  10  --   --   ALKPHOS  --   --  67  --   --   BILITOT  --   --  0.3  --   --    PROT  --   --  6.4  --   --   ALBUMIN 2.9* 3.2* 3.0* 2.8* 2.7*   No results found for this basename: LIPASE, AMYLASE,  in the last 168 hours No results found for this basename: AMMONIA,  in the last 168 hours  CBC:  Recent Labs Lab 07/05/13 0820 07/06/13 0443 07/06/13 0845 07/07/13 0335 07/08/13 0340  WBC 4.4 5.8 5.0 4.6 5.0  NEUTROABS  --   --  2.9 3.2  --   HGB 12.1 13.1 13.0 12.3 12.4  HCT 34.7* 38.9 38.6 37.0 37.4  MCV 88.7 92.0 92.8 93.2 94.0  PLT 221 236 229 164 132*    Cardiac Enzymes: No results found for this basename: CKTOTAL, CKMB, CKMBINDEX, TROPONINI,  in the last 168 hours  Lipid Panel: No results found for this basename: CHOL, TRIG, HDL, CHOLHDL, VLDL, LDLCALC,  in the last 168 hours  CBG:  Recent Labs Lab 07/07/13 2330 07/08/13 0348 07/08/13 0753 07/08/13 1140 07/08/13 1627  GLUCAP 297* 337* 336* 323* 257*    Microbiology: Results for orders placed during the hospital encounter of 07/01/13  URINE CULTURE     Status: None   Collection Time    07/01/13 12:37 PM      Result Value Range Status   Specimen Description URINE, CATHETERIZED   Final   Special Requests NONE   Final   Culture  Setup Time     Final   Value: 07/01/2013 20:06     Performed at [facility] Count     Final   Value: >=100,000 COLONIES/ML     Performed at [facility]   Culture     Final   Value: ESCHERICHIA COLI     Performed at [facility]   Report Status 07/03/2013 FINAL   Final   Organism ID, Bacteria ESCHERICHIA COLI   Final  CULTURE, BLOOD (ROUTINE X 2)     Status: None   Collection Time    07/05/13 11:20 PM      Result Value Range Status   Specimen Description BLOOD LEFT HAND   Final   Special Requests BOTTLES DRAWN AEROBIC AND ANAEROBIC 3CC   Final   Culture  Setup Time     Final   Value: 07/06/2013 04:42     Performed at [facility]   Culture     Final   Value:        BLOOD CULTURE RECEIVED NO GROWTH TO DATE  CULTURE WILL BE HELD FOR 5 DAYS BEFORE ISSUING A FINAL NEGATIVE REPORT     Performed at [facility]   Report Status PENDING   Incomplete  CULTURE, BLOOD (ROUTINE X 2)     Status: None   Collection Time    07/05/13 11:24 PM      Result Value Range Status   Specimen Description BLOOD RIGHT ANTECUBITAL   Final   Special Requests BOTTLES DRAWN AEROBIC AND ANAEROBIC 5CC   Final   Culture  Setup Time     Final   Value: 07/06/2013 04:42     Performed at [facility]   Culture     Final   Value:        BLOOD CULTURE RECEIVED NO GROWTH TO DATE CULTURE WILL BE HELD FOR 5 DAYS BEFORE ISSUING A FINAL NEGATIVE REPORT     Performed at [facility]   Report Status PENDING   Incomplete  URINE CULTURE     Status: None   Collection Time    07/06/13  8:01 AM      Result Value Range Status   Specimen Description URINE, CATHETERIZED   Final   Special Requests NONE   Final   Culture  Setup Time     Final   Value: 07/06/2013 10:49     Performed at [facility] Count     Final   Value: 40,000 COLONIES/ML     Performed at [facility]   Culture     Final   Value: YEAST     Performed at [facility]   Report Status 07/07/2013 FINAL   Final  CULTURE, BLOOD (SINGLE)     Status: None   Collection Time    07/06/13  8:45 AM      Result Value Range Status   Specimen Description BLOOD RIGHT ARM   Final   Special Requests BOTTLES DRAWN AEROBIC ONLY 3CC    Final   Culture  Setup Time     Final   Value: 07/06/2013 10:49     Performed at [facility]   Culture     Final   Value:        BLOOD CULTURE RECEIVED NO GROWTH TO DATE CULTURE WILL BE HELD FOR 5 DAYS BEFORE ISSUING A FINAL NEGATIVE REPORT     Performed at [facility]   Report Status PENDING   Incomplete  MRSA PCR SCREENING     Status: None   Collection Time    07/06/13  9:25 AM      Result Value Range Status   MRSA by PCR NEGATIVE  NEGATIVE Final   Comment:            The  GeneXpert MRSA Assay (FDA     approved for NASAL specimens     only), is one component of a     comprehensive MRSA colonization     surveillance program. It is not     intended to diagnose MRSA     infection nor to guide or     monitor treatment for     MRSA infections.  GRAM STAIN     Status: None   Collection Time    07/07/13  5:40 PM      Result Value Range Status   Specimen Description CSF CSF   Final   Special Requests NONE   Final   Gram Stain     Final  Value: NO ORGANISMS SEEN     NO WBC SEEN     Gram Stain Report Called to,Read Back By and Verified With: AYERS,C. AT 1958 ON 09.26.14 BY LOVE,T.   Report Status 07/07/2013 FINAL   Final  BODY FLUID CULTURE     Status: None   Collection Time    07/07/13  5:42 PM      Result Value Range Status   Specimen Description Body Fluid   Final   Special Requests NONE   Final   Gram Stain     Final   Value: NO WBC SEEN     NO ORGANISMS SEEN     Performed at [facility]   Culture PENDING   Incomplete   Report Status PENDING   Incomplete    Coagulation Studies:  Recent Labs  07/07/13 1432  LABPROT 14.5  INR 1.15    Imaging: No results found.  Medications:  I have reviewed the patient's current medications. Scheduled: . acyclovir  500 mg Intravenous Q8H  . ampicillin (OMNIPEN) IV  2 g Intravenous Q4H  . antiseptic oral rinse  15 mL Mouth Rinse q12n4p  . cefTRIAXone (ROCEPHIN)  IV  2 g Intravenous Q12H  . chlorhexidine  15 mL Mouth Rinse BID  . dexamethasone  10 mg Intravenous Q6H  . famotidine (PEPCID) IV  20 mg Intravenous Daily  . insulin aspart  0-20 Units Subcutaneous TID WC  . insulin detemir  45 Units Subcutaneous BID  . latanoprost  1 drop Both Eyes QHS  . levETIRAcetam  500 mg Intravenous Q12H  . levothyroxine  37.5 mcg Intravenous Daily  . liothyronine  25 mcg Oral BID  . metoprolol      . metoprolol  10 mg Intravenous Q6H  . metronidazole  500 mg Intravenous Q8H  . sodium chloride  3 mL  Intravenous Q12H  . vancomycin  750 mg Intravenous Q12H    Assessment/Plan: Patient slightly improved today.  LP results reviewed.  There is one wbc.  Protein and glucose are normal.  Patient remains on broad spectrum antibiotics.  HSV remains in the differential. Gram stain negative.  Unfortunately it is not surprising that the CSF is sterile since the patient was on antibiotics prior to her decline.  She has had no further seizures.  Remains slightly focal on examination.     Recommendations: 1.  If the patient does not continue to further improve would repeat MRI-it was motion degraded initially 2.  May want to simplify antibiotic regimen but would continue Acyclovir until HSV titer returns negative.   3.  Continue Keppra at current dose 4.  Continue seizure precautions   LOS: 7 days   Thana Farr, MD [facility] [phone] 07/08/2013  5:53 PM

## 2013-07-08 NOTE — Progress Notes (Signed)
[facility] PROGRESS NOTE  Regina [name] ZOX:[id number] DOB: [birth year] DOA: 07/01/2013 PCP: Michiel Sites, MD  Assessment/Plan: #1 acute encephalopathy Questionable etiology. May be secondary to seizures in the setting of probable bacterial meningitis with patient with nuchal rigidity and a rectal temperature of 102 with altered mental status. Per nursing patient with right upper extremity jerking motions 3 nights ago. No significant improvement in mental status.  CT of the head was negative. MRI of the head is negative for any acute infarct. EEG is negative for any epileptiform discharges. Patient was hypokalemicand potassium repleted.  Potassium today is 3.8. Keep potassium greater than 4.  Placed on Ativan 1 mg IV every 2 hours when necessary. Neuro checks every 4 hours. S/P lumbar puncture yesterday. CSF cultures neg. CSF HSV cultures pending.  Aggrenox and Lovenox which have been discontinued. Repeat blood cultures pending. Replete electrolytes. Continue empirically on IV vancomycin, Rocephin, ampicillin, acyclovir, Decadron. Continue IV Keppra. Neurology following and appreciate input and recommendations.  #2 probable seizure Patient noted to have jerking motion of the right upper extremity mostly throughout the night  2 days ago in addition to altered mental status. Patient's eyes rolled and deviated to the right. Patient also noted to have a fever. CT of the head was negative. MRI of the head was negative. EEG was negative. Significant improvement in her jerking in the right upper extremity. Patient was loaded with IV Keppra and is on Keppra IV every 12 hours. Cultures are pending. Continue empiric IV vancomycin, Rocephin, ampicillin, acyclovir and Decadron for possible meningitis. Neuro checks. Neurology following and appreciate input and recommendations.   #3 possible meningitis/CSF infection  Patient noted to have a rectal temperature 102  2 days ago. Fever curve trending down.  Patient with significant nuchal rigidity. Patient with altered mental status. Blood cultures are pending. Patient s/p LP yesterday.  MRI head negative. EEG negative. Culture from CSF neg. HSV CSF cultures pending. Continue to hold Aggrenox and Lovenox for now. No improvement with mental status. Continue IV vancomycin, Rocephin, ampicillin, acyclovir and Decadron to cover empirically for CSF infection. Neurology following and appreciate your input and recommendations.  #4 acute renal failure Likely secondary to volume depletion and hypotension in the setting of ARB. Clinical improvement. Renal function with creatinine now of 0.94. Good urine output with 2415 cc over the past 24 hours. Continue Foley catheter. Bicarbonate drip has been discontinued as acidosis has resolved. Continue to hold ARB and metformin.   #5 hypokalemia/hypomagnesemia Likely secondary to GI losses from C. difficile colitis and increased urinary output. Replete magnesium.   #6 C. difficile colitis On IV Flagyl. Follow.  #7 hypernatremia Sodium levels still elevated. Increase D5W 150 cc per hour. Give a bolus of 0.45 NS. Follow.  #8 Escherichia coli UTI Patient had received IV Rocephin x3 days. D/C IV Rocephin 3 days ago. Resumed IV Rocephin in light of concern for bacterial meningitis.   #9 hypertension Increase IV Lopressor to 10 mg q8.  #10 history of CVA Aggrenox held for lumbar puncture yesterday. Patient not alert to take orals. ?? Rectal ASA .Will defer to neurology.  #11 diabetes mellitus Hemoglobin A1c is 9.6. CBGs have ranged from 297-336. Patient on decadron and D5. Increase Levemir to 45 units subcutaneous twice a day. Continue sliding scale insulin.  #12 hypothyroidism Continue Synthroid and Cytomel.  #13 anxiety/depression Stable.  #14 prophylaxis  Pepcid for GI prophylaxis. SCDs for DVT prophylaxis.  Code Status: Full Family Communication: Updated daughter at bedside. Disposition Plan:  Remain in  ICU.   Consultants:  Nephrology: Dr. Eliott Nine 07/02/2013:  Neurology : Dr Thad Ranger 07/06/13  Procedures:  CT head 07/05/2013  KUB 07/05/2013  KUB 07/01/2013  Chest x-ray 07/01/2013  MRI head 07/06/13  EEG 07/06/13  LP 07/07/13  Antibiotics:  IV Rocephin 07/01/2013--> 07/05/2013  IV Flagyl 07/04/2013-  IV vancomycin 07/06/2013  IV Rocephin 07/06/2013  IV ampicillin 07/06/2013  IV Acyclovir 07/06/13  HPI/Subjective: Patient is lethargic. Patient minimally responsive to verbal and noxious stimuli. Eyes open. Raised arms today. Per nursing patient with right upper extremity jerking motions improved.  No significant improvement with mental status.   Objective: Filed Vitals:   07/08/13 0800  BP: 193/88  Pulse: 81  Temp: 98.9 F (37.2 C)  Resp: 14    Intake/Output Summary (Last 24 hours) at 07/08/13 1038 Last data filed at 07/08/13 0900  Gross per 24 hour  Intake   4260 ml  Output   2565 ml  Net   1695 ml   Filed Weights   07/06/13 1500 07/07/13 0400 07/08/13 0400  Weight: 91.4 kg (201 lb 8 oz) 83.4 kg (183 lb 13.8 oz) 89.2 kg (196 lb 10.4 oz)    Exam:   General:  Lethargic. Minimally responsive.Eyes open.   Neck: Rigid neck  Cardiovascular: RRR  Respiratory: CTAB.  Abdomen: Soft/NT/ND/+BS  Musculoskeletal: No c/c/e  Neuro: Patient is lethargic minimally responding to verbal stimuli. Eyes open. Raised arms today. Neck rigidity.  Data Reviewed: Basic Metabolic Panel:  Recent Labs Lab 07/04/13 0810 07/05/13 0420 07/05/13 0820 07/06/13 0443 07/06/13 0830 07/07/13 0335 07/07/13 1352 07/08/13 0340  NA 139 152* 147* 152* 156* 151* 154* 153*  K 3.6 2.6* 2.7* 2.6* 2.6* 3.5 3.8 3.8  CL 85* 95* 92* 96 101 106 108 109  CO2 17* 29 27 25 26  32 35* 33*  GLUCOSE 452* 294* 328* 474* 453* 412* 407* 365*  BUN 58* 36* 32* 25* 25* 23 23 18   CREATININE 3.57* 2.01* 1.65* 1.33* 1.38* 1.07 1.12* 0.94  CALCIUM 8.2* 7.8* 7.8* 7.9* 7.6* 7.8* 7.8* 7.5*  MG   --   --  1.3* 2.3  --   --   --  1.2*  PHOS 4.1 3.0  --  2.3  --  1.8*  --  2.8  2.7   Liver Function Tests:  Recent Labs Lab 07/01/13 1134  07/05/13 0420 07/06/13 0443 07/06/13 0830 07/07/13 0335 07/08/13 0340  AST 14  --   --   --  14  --   --   ALT 12  --   --   --  10  --   --   ALKPHOS 66  --   --   --  67  --   --   BILITOT 0.4  --   --   --  0.3  --   --   PROT 6.5  --   --   --  6.4  --   --   ALBUMIN 3.2*  < > 2.9* 3.2* 3.0* 2.8* 2.7*  < > = values in this interval not displayed. No results found for this basename: LIPASE, AMYLASE,  in the last 168 hours No results found for this basename: AMMONIA,  in the last 168 hours CBC:  Recent Labs Lab 07/01/13 1134  07/05/13 0820 07/06/13 0443 07/06/13 0845 07/07/13 0335 07/08/13 0340  WBC 7.1  < > 4.4 5.8 5.0 4.6 5.0  NEUTROABS 4.7  --   --   --  2.9 3.2  --   HGB 12.4  < > 12.1 13.1 13.0 12.3 12.4  HCT 35.3*  < > 34.7* 38.9 38.6 37.0 37.4  MCV 88.5  < > 88.7 92.0 92.8 93.2 94.0  PLT 167  < > 221 236 229 164 132*  < > = values in this interval not displayed. Cardiac Enzymes:  Recent Labs Lab 07/01/13 1134  CKTOTAL 38  TROPONINI <0.30   BNP (last 3 results) No results found for this basename: PROBNP,  in the last 8760 hours CBG:  Recent Labs Lab 07/07/13 1312 07/07/13 1544 07/07/13 1940 07/07/13 2330 07/08/13 0753  GLUCAP 386* 280* 249* 297* 336*    Recent Results (from the past 240 hour(s))  URINE CULTURE     Status: None   Collection Time    07/01/13 12:37 PM      Result Value Range Status   Specimen Description URINE, CATHETERIZED   Final   Special Requests NONE   Final   Culture  Setup Time     Final   Value: 07/01/2013 20:06     Performed at [facility] Count     Final   Value: >=100,000 COLONIES/ML     Performed at [facility]   Culture     Final   Value: ESCHERICHIA COLI     Performed at [facility]   Report Status 07/03/2013 FINAL   Final    Organism ID, Bacteria ESCHERICHIA COLI   Final  CULTURE, BLOOD (ROUTINE X 2)     Status: None   Collection Time    07/05/13 11:20 PM      Result Value Range Status   Specimen Description BLOOD LEFT HAND   Final   Special Requests BOTTLES DRAWN AEROBIC AND ANAEROBIC 3CC   Final   Culture  Setup Time     Final   Value: 07/06/2013 04:42     Performed at [facility]   Culture     Final   Value:        BLOOD CULTURE RECEIVED NO GROWTH TO DATE CULTURE WILL BE HELD FOR 5 DAYS BEFORE ISSUING A FINAL NEGATIVE REPORT     Performed at [facility]   Report Status PENDING   Incomplete  CULTURE, BLOOD (ROUTINE X 2)     Status: None   Collection Time    07/05/13 11:24 PM      Result Value Range Status   Specimen Description BLOOD RIGHT ANTECUBITAL   Final   Special Requests BOTTLES DRAWN AEROBIC AND ANAEROBIC 5CC   Final   Culture  Setup Time     Final   Value: 07/06/2013 04:42     Performed at [facility]   Culture     Final   Value:        BLOOD CULTURE RECEIVED NO GROWTH TO DATE CULTURE WILL BE HELD FOR 5 DAYS BEFORE ISSUING A FINAL NEGATIVE REPORT     Performed at [facility]   Report Status PENDING   Incomplete  URINE CULTURE     Status: None   Collection Time    07/06/13  8:01 AM      Result Value Range Status   Specimen Description URINE, CATHETERIZED   Final   Special Requests NONE   Final   Culture  Setup Time     Final   Value: 07/06/2013 10:49     Performed at [facility]  Colony Count     Final   Value: 40,000 COLONIES/ML     Performed at [facility]   Culture     Final   Value: YEAST     Performed at [facility]   Report Status 07/07/2013 FINAL   Final  CULTURE, BLOOD (SINGLE)     Status: None   Collection Time    07/06/13  8:45 AM      Result Value Range Status   Specimen Description BLOOD RIGHT ARM   Final   Special Requests BOTTLES DRAWN AEROBIC ONLY 3CC    Final   Culture  Setup Time     Final   Value:  07/06/2013 10:49     Performed at [facility]   Culture     Final   Value:        BLOOD CULTURE RECEIVED NO GROWTH TO DATE CULTURE WILL BE HELD FOR 5 DAYS BEFORE ISSUING A FINAL NEGATIVE REPORT     Performed at [facility]   Report Status PENDING   Incomplete  MRSA PCR SCREENING     Status: None   Collection Time    07/06/13  9:25 AM      Result Value Range Status   MRSA by PCR NEGATIVE  NEGATIVE Final   Comment:            The GeneXpert MRSA Assay (FDA     approved for NASAL specimens     only), is one component of a     comprehensive MRSA colonization     surveillance program. It is not     intended to diagnose MRSA     infection nor to guide or     monitor treatment for     MRSA infections.  GRAM STAIN     Status: None   Collection Time    07/07/13  5:40 PM      Result Value Range Status   Specimen Description CSF CSF   Final   Special Requests NONE   Final   Gram Stain     Final   Value: NO ORGANISMS SEEN     NO WBC SEEN     Gram Stain Report Called to,Read Back By and Verified With: AYERS,C. AT 1958 ON 09.26.14 BY LOVE,T.   Report Status 07/07/2013 FINAL   Final  BODY FLUID CULTURE     Status: None   Collection Time    07/07/13  5:42 PM      Result Value Range Status   Specimen Description Body Fluid   Final   Special Requests NONE   Final   Gram Stain     Final   Value: NO WBC SEEN     NO ORGANISMS SEEN     Performed at [facility]   Culture PENDING   Incomplete   Report Status PENDING   Incomplete     Studies: Mr Brain Wo Contrast  07/06/2013   CLINICAL DATA:  Altered mental status  EXAM: MRI HEAD WITHOUT CONTRAST  TECHNIQUE: Multiplanar, multisequence MR imaging was performed. No intravenous contrast was administered.  COMPARISON:  MRI 06/08/2013  FINDINGS: Image quality degraded by motion.  Negative for acute infarct. Diffusion-weighted imaging is diagnostic.  Generalized atrophy. Chronic microvascular ischemic changes in the white  matter. Chronic infarcts in the basal ganglia and pons bilaterally. No hemorrhage or mass. No fluid collection.  Paranasal sinuses are clear.  IMPRESSION: Image quality degraded by motion. No acute abnormality.  Electronically Signed   By: Marlan Palau M.D.   On: 07/06/2013 13:34    Scheduled Meds: . acyclovir  500 mg Intravenous Q12H  . ampicillin (OMNIPEN) IV  2 g Intravenous Q4H  . antiseptic oral rinse  15 mL Mouth Rinse q12n4p  . cefTRIAXone (ROCEPHIN)  IV  2 g Intravenous Q12H  . chlorhexidine  15 mL Mouth Rinse BID  . dexamethasone  10 mg Intravenous Q6H  . famotidine (PEPCID) IV  20 mg Intravenous Daily  . insulin aspart  0-20 Units Subcutaneous TID WC  . insulin detemir  40 Units Subcutaneous BID  . latanoprost  1 drop Both Eyes QHS  . levETIRAcetam  500 mg Intravenous Q12H  . levothyroxine  37.5 mcg Intravenous Daily  . liothyronine  25 mcg Oral BID  . metoprolol  10 mg Intravenous Q8H  . metronidazole  500 mg Intravenous Q8H  . simvastatin  20 mg Oral QPM  . sodium chloride  500 mL Intravenous Once  . sodium chloride  3 mL Intravenous Q12H  . vancomycin  750 mg Intravenous Q12H   Continuous Infusions: . dextrose 5 % 1,000 mL with potassium chloride 40 mEq infusion 125 mL/hr at 07/08/13 1035    Principal Problem:   Acute encephalopathy Active Problems:   HYPOTHYROIDISM   DIABETES MELLITUS, TYPE II   HYPERLIPIDEMIA   UTI (urinary tract infection)   HTN (hypertension)   Anxiety and depression   CVA (cerebral infarction)   Weakness generalized   Dehydration   Acute renal failure   Failure to thrive   Diarrhea   Hyponatremia   Metabolic acidosis   Clostridium difficile colitis   Hypomagnesemia   Hypernatremia   Seizures   Meningitis, unspecified(322.9)    Time spent: > 35 mins    [facility]  [facility] Pager [phone]. If 7PM-7AM, please contact night-coverage at [email or web address], password [facility] 07/08/2013, 10:38 AM  LOS: 7 days

## 2013-07-09 ENCOUNTER — Inpatient Hospital Stay (HOSPITAL_COMMUNITY): Payer: Medicare Other

## 2013-07-09 LAB — CBC
MCH: 30.7 pg (ref 26.0–34.0)
Platelets: 112 10*3/uL — ABNORMAL LOW (ref 150–400)
RDW: 13.9 % (ref 11.5–15.5)
WBC: 6.4 10*3/uL (ref 4.0–10.5)

## 2013-07-09 LAB — GLUCOSE, CAPILLARY
Glucose-Capillary: 259 mg/dL — ABNORMAL HIGH (ref 70–99)
Glucose-Capillary: 239 mg/dL — ABNORMAL HIGH (ref 70–99)
Glucose-Capillary: 196 mg/dL — ABNORMAL HIGH (ref 70–99)
Glucose-Capillary: 256 mg/dL — ABNORMAL HIGH (ref 70–99)

## 2013-07-09 LAB — RENAL FUNCTION PANEL
Albumin: 2.6 g/dL — ABNORMAL LOW (ref 3.5–5.2)
CO2: 34 mEq/L — ABNORMAL HIGH (ref 19–32)
Chloride: 101 mEq/L (ref 96–112)
GFR calc Af Amer: 82 mL/min — ABNORMAL LOW (ref >90)
GFR calc non Af Amer: 71 mL/min — ABNORMAL LOW (ref >90)
Potassium: 3.6 mEq/L (ref 3.5–5.1)

## 2013-07-09 LAB — VANCOMYCIN, TROUGH: Vancomycin Tr: 19.7 ug/mL (ref 10.0–20.0)

## 2013-07-09 LAB — MAGNESIUM: Magnesium: 1.5 mg/dL (ref 1.5–2.5)

## 2013-07-09 MED ORDER — SODIUM CHLORIDE 0.9 % IJ SOLN
10.0000 mL | INTRAMUSCULAR | Status: DC | PRN
  Administered 2013-07-10 – 2013-07-13 (×5): 10 mL
  Administered 2013-07-13: 20:00:00 20 mL
  Administered 2013-07-13 (×2): 10 mL
  Administered 2013-07-14: 03:00:00 30 mL
  Administered 2013-07-14: 05:00:00 10 mL
  Administered 2013-07-20: 20 mL

## 2013-07-09 MED ORDER — INSULIN DETEMIR 100 UNIT/ML ~~LOC~~ SOLN
50.0000 [IU] | Freq: Two times a day (BID) | SUBCUTANEOUS | Status: DC
  Filled 2013-07-09 (×2): qty 0.5

## 2013-07-09 MED ORDER — POTASSIUM PHOSPHATE DIBASIC 3 MMOLE/ML IV SOLN: 30.0000 mmol | Freq: Once | INTRAVENOUS | Status: DC

## 2013-07-09 MED ORDER — POTASSIUM PHOSPHATE DIBASIC 3 MMOLE/ML IV SOLN
10.0000 mmol | Freq: Once | INTRAVENOUS | Status: AC
  Administered 2013-07-09: 10 mmol via INTRAVENOUS
  Filled 2013-07-09: qty 3.33

## 2013-07-09 MED ORDER — HYDRALAZINE HCL 20 MG/ML IJ SOLN
10.0000 mg | Freq: Four times a day (QID) | INTRAMUSCULAR | Status: DC | PRN
  Administered 2013-07-09 – 2013-07-15 (×3): 10 mg via INTRAVENOUS
  Filled 2013-07-09 (×3): qty 1

## 2013-07-09 MED ORDER — INSULIN DETEMIR 100 UNIT/ML ~~LOC~~ SOLN
45.0000 [IU] | Freq: Two times a day (BID) | SUBCUTANEOUS | Status: DC
  Administered 2013-07-09 – 2013-07-12 (×6): 45 [IU] via SUBCUTANEOUS
  Filled 2013-07-09 (×9): qty 0.45

## 2013-07-09 MED ORDER — HYDRALAZINE HCL 20 MG/ML IJ SOLN
5.0000 mg | Freq: Four times a day (QID) | INTRAMUSCULAR | Status: DC | PRN
  Administered 2013-07-09: 5 mg via INTRAVENOUS
  Filled 2013-07-09: qty 1

## 2013-07-09 MED ORDER — PRO-STAT SUGAR FREE PO LIQD
30.0000 mL | Freq: Two times a day (BID) | ORAL | Status: DC
  Administered 2013-07-09 – 2013-07-10 (×2): 30 mL
  Filled 2013-07-09 (×3): qty 30

## 2013-07-09 MED ORDER — MAGNESIUM SULFATE 40 MG/ML IJ SOLN
4.0000 g | Freq: Once | INTRAMUSCULAR | Status: AC
  Administered 2013-07-09: 4 g via INTRAVENOUS
  Filled 2013-07-09: qty 100

## 2013-07-09 MED ORDER — SODIUM CHLORIDE 0.9 % IJ SOLN
10.0000 mL | Freq: Two times a day (BID) | INTRAMUSCULAR | Status: DC
  Administered 2013-07-09 – 2013-07-21 (×5): 10 mL
  Administered 2013-07-21: 20 mL

## 2013-07-09 MED ORDER — SODIUM CHLORIDE 0.45 % IV SOLN
INTRAVENOUS | Status: DC
  Administered 2013-07-09 (×2): via INTRAVENOUS
  Filled 2013-07-09 (×5): qty 1000

## 2013-07-09 MED ORDER — POTASSIUM CHLORIDE 10 MEQ/100ML IV SOLN
10.0000 meq | INTRAVENOUS | Status: AC
  Administered 2013-07-09 (×3): 10 meq via INTRAVENOUS
  Filled 2013-07-09 (×3): qty 100

## 2013-07-09 MED ORDER — VITAL AF 1.2 CAL PO LIQD
1000.0000 mL | ORAL | Status: DC
  Administered 2013-07-09: 1000 mL
  Filled 2013-07-09 (×2): qty 1000

## 2013-07-09 NOTE — Progress Notes (Signed)
NUTRITION FOLLOW UP  Intervention:   Initiate Vital 1.2 @ 10 ml/hr via panda tube and increase by 10 ml every 12 hours to goal rate of 35 ml/hr. 30 ml Prostat BID.  At goal rate, tube feeding regimen will provide 1208 kcal (100% of estimated needs), 93 grams of protein (98% of estimated needs), and 680 ml of H2O.   Enteral nutrition to provide 60-70% of estimated calorie needs (22-25 kcals/kg ideal body weight) and 100% of estimated protein needs, based on ASPEN guidelines for permissive underfeeding in critically ill obese individuals.  Monitor magnesium, potassium, and phosphorus daily for at least 3 days, MD to replete as needed, as pt is at risk for refeeding syndrome given ongoing very poor po intake.  Nutrition Dx:   Inadequate oral intake related to poor appetite as evidenced by ongoing weight loss and poor meal completion; unmet  Inadequate oral intake related to inability to eat as evidenced by NPO status  Goal:   Pt to meet >/= 90% of their estimated nutrition needs; unmet  Monitor:   Weight, labs, I/O's, toleration of TF  Assessment:   PMHx significant for DM2, retinopathy, legal blindness, HTN, CVA, hypothyroidism, anxiety and depression. Noted pt with 2 recent admissions to [facility] within the past month. Pt was d/c'd to rehab for about 3 weeks after hospitalization. Pt developed diarrhea, poor appetite, and overall decline x 9 days at [facility]. Family thought decline was 2/2 lack of care at SNF, pt was then sent home. Remains weak and bed bound. Reported weight loss of 25 lb x 1 month.  Pt found positive for C. Difficile on 9/23.  Pt transferred to ICU on 9/25 for fever and possible seizure activity.  Pt's weight was 234 lbs on 9/22 and has since dropped to 196 lbs 9/28. This reflects a 16% weight loss in less than 1 week.  Pt's usual body weight is 230-235 lbs.  9/28 Magnesium, Sodium, and Potassium are WNL and Phosphorus is low at 2.1  Panda tube has been placed.   Pt meets  criteria for severe MALNUTRITION in the context of chronic illness as evidenced by intake of <75% x at least 1 month and wt loss of >5% x 1 month.  Height: Ht Readings from Last 1 Encounters:  07/01/13 5\' 2"  (1.575 m)    Weight Status:   Wt Readings from Last 1 Encounters:  07/09/13 196 lb 10.4 oz (89.2 kg)    Re-estimated needs:  Kcal: 1100-1250 Protein: 95-105 g Fluid: Per MD  Skin: WNL  Diet Order: NPO   Intake/Output Summary (Last 24 hours) at 07/09/13 1431 Last data filed at 07/09/13 1400  Gross per 24 hour  Intake   5085 ml  Output   3000 ml  Net   2085 ml    Last BM: 9/28   Labs:   Recent Labs Lab 07/06/13 0443  07/07/13 0335  07/08/13 0340 07/08/13 1327 07/09/13 0340  NA 152*  < > 151*  < > 153* 146* 144  K 2.6*  < > 3.5  < > 3.8 4.2 3.6  CL 96  < > 106  < > 109 103 101  CO2 25  < > 32  < > 33* 32 34*  BUN 25*  < > 23  < > 18 17 14   CREATININE 1.33*  < > 1.07  < > 0.94 0.88 0.82  CALCIUM 7.9*  < > 7.8*  < > 7.5* 7.4* 7.5*  MG 2.3  --   --   --  1.2*  --  1.5  PHOS 2.3  --  1.8*  --  2.8  2.7  --  2.1*  GLUCOSE 474*  < > 412*  < > 365* 300* 261*  < > = values in this interval not displayed.  CBG (last 3)   Recent Labs  07/09/13 0426 07/09/13 0827 07/09/13 1156  GLUCAP 259* 230* 239*    Scheduled Meds: . acyclovir  500 mg Intravenous Q8H  . antiseptic oral rinse  15 mL Mouth Rinse q12n4p  . cefTRIAXone (ROCEPHIN)  IV  2 g Intravenous Q12H  . chlorhexidine  15 mL Mouth Rinse BID  . famotidine (PEPCID) IV  20 mg Intravenous Daily  . insulin aspart  0-20 Units Subcutaneous Q4H  . insulin detemir  50 Units Subcutaneous BID  . latanoprost  1 drop Both Eyes QHS  . levETIRAcetam  500 mg Intravenous Q12H  . levothyroxine  37.5 mcg Intravenous Daily  . liothyronine  25 mcg Oral BID  . metoprolol  10 mg Intravenous Q6H  . metronidazole  500 mg Intravenous Q8H  . potassium phosphate IVPB (mmol)  10 mmol Intravenous Once  . sodium chloride   10-40 mL Intracatheter Q12H  . sodium chloride  3 mL Intravenous Q12H  . vancomycin  750 mg Intravenous Q12H    Continuous Infusions: . sodium chloride 0.45 % 1,000 mL with potassium chloride 20 mEq infusion 150 mL/hr at 07/09/13 1049    Ebbie Latus RD, LDN

## 2013-07-09 NOTE — Progress Notes (Signed)
Subjective: Patient further improved.  Per family was able to call each of them by name today.  No seizure activity noted.    Objective: Current vital signs: BP 180/86  Pulse 68  Temp(Src) 98.9 F (37.2 C) (Oral)  Resp 13  Ht 5\' 2"  (1.575 m)  Wt 89.2 kg (196 lb 10.4 oz)  BMI 35.96 kg/m2  SpO2 94% Vital signs in last 24 hours: Temp:  [97.6 F (36.4 C)-99.1 F (37.3 C)] 98.9 F (37.2 C) (09/28 0800) Pulse Rate:  [60-73] 68 (09/28 1400) Resp:  [9-15] 13 (09/28 1400) BP: (142-198)/(55-95) 180/86 mmHg (09/28 1400) SpO2:  [94 %-100 %] 94 % (09/28 1400) Weight:  [89.2 kg (196 lb 10.4 oz)] 89.2 kg (196 lb 10.4 oz) (09/28 0400)  Intake/Output from previous day: 09/27 0701 - 09/28 0700 In: 4850 [I.V.:2780; IV Piggyback:2070] Out: 2800 [Urine:2800] Intake/Output this shift: Total I/O In: 2310 [I.V.:1200; IV Piggyback:1110] Out: 1150 [Urine:1150] Nutritional status: NPO  Neurologic Exam: Mental Status:  Open eyes when her name is called. Is able to say 1-2 words.  Follows simple commands.  Is able to keep her eyes open.  Cranial Nerves:  II: Focus on examiner on either side of the bed, pupils right 3 mm, left 3 mm,and reactive bilaterally.  III,IV,VI: Control and instrumentation engineer around the room  V,VII: corneal reflex reduced bilaterally,  VIII: patient does not respond to verbal stimuli  IX,X: gag reflex reduced, XI: trapezius strength unable to test bilaterally but patient does move head from side-to-side spontaneously  XII: midline tongue extension Motor:  Moves all extremities to command but unable to move the left as well as the right Sensory:  Respond to noxious stimuli in all extremities.  Deep Tendon Reflexes:  2+ in the upper extremities and 1+ in the lower extremities  Plantars:  downgoing on the right and mute on the left    Lab Results: Basic Metabolic Panel:  Recent Labs Lab 07/05/13 0420 07/05/13 0820 07/06/13 0443  07/07/13 0335 07/07/13 1352 07/08/13 0340  07/08/13 1327 07/09/13 0340  NA 152* 147* 152*  < > 151* 154* 153* 146* 144  K 2.6* 2.7* 2.6*  < > 3.5 3.8 3.8 4.2 3.6  CL 95* 92* 96  < > 106 108 109 103 101  CO2 29 27 25   < > 32 35* 33* 32 34*  GLUCOSE 294* 328* 474*  < > 412* 407* 365* 300* 261*  BUN 36* 32* 25*  < > 23 23 18 17 14   CREATININE 2.01* 1.65* 1.33*  < > 1.07 1.12* 0.94 0.88 0.82  CALCIUM 7.8* 7.8* 7.9*  < > 7.8* 7.8* 7.5* 7.4* 7.5*  MG  --  1.3* 2.3  --   --   --  1.2*  --  1.5  PHOS 3.0  --  2.3  --  1.8*  --  2.8  2.7  --  2.1*  < > = values in this interval not displayed.  Liver Function Tests:  Recent Labs Lab 07/06/13 0443 07/06/13 0830 07/07/13 0335 07/08/13 0340 07/09/13 0340  AST  --  14  --   --   --   ALT  --  10  --   --   --   ALKPHOS  --  67  --   --   --   BILITOT  --  0.3  --   --   --   PROT  --  6.4  --   --   --  ALBUMIN 3.2* 3.0* 2.8* 2.7* 2.6*   No results found for this basename: LIPASE, AMYLASE,  in the last 168 hours No results found for this basename: AMMONIA,  in the last 168 hours  CBC:  Recent Labs Lab 07/06/13 0443 07/06/13 0845 07/07/13 0335 07/08/13 0340 07/09/13 0340  WBC 5.8 5.0 4.6 5.0 6.4  NEUTROABS  --  2.9 3.2  --   --   HGB 13.1 13.0 12.3 12.4 12.6  HCT 38.9 38.6 37.0 37.4 38.2  MCV 92.0 92.8 93.2 94.0 92.9  PLT 236 229 164 132* 112*    Cardiac Enzymes: No results found for this basename: CKTOTAL, CKMB, CKMBINDEX, TROPONINI,  in the last 168 hours  Lipid Panel: No results found for this basename: CHOL, TRIG, HDL, CHOLHDL, VLDL, LDLCALC,  in the last 168 hours  CBG:  Recent Labs Lab 07/08/13 1929 07/09/13 0113 07/09/13 0426 07/09/13 0827 07/09/13 1156  GLUCAP 256* 261* 259* 230* 239*    Microbiology: Results for orders placed during the hospital encounter of 07/01/13  URINE CULTURE     Status: None   Collection Time    07/01/13 12:37 PM      Result Value Range Status   Specimen Description URINE, CATHETERIZED   Final   Special Requests  NONE   Final   Culture  Setup Time     Final   Value: 07/01/2013 20:06     Performed at [facility] Count     Final   Value: >=100,000 COLONIES/ML     Performed at [facility]   Culture     Final   Value: ESCHERICHIA COLI     Performed at [facility]   Report Status 07/03/2013 FINAL   Final   Organism ID, Bacteria ESCHERICHIA COLI   Final  CULTURE, BLOOD (ROUTINE X 2)     Status: None   Collection Time    07/05/13 11:20 PM      Result Value Range Status   Specimen Description BLOOD LEFT HAND   Final   Special Requests BOTTLES DRAWN AEROBIC AND ANAEROBIC 3CC   Final   Culture  Setup Time     Final   Value: 07/06/2013 04:42     Performed at [facility]   Culture     Final   Value:        BLOOD CULTURE RECEIVED NO GROWTH TO DATE CULTURE WILL BE HELD FOR 5 DAYS BEFORE ISSUING A FINAL NEGATIVE REPORT     Performed at [facility]   Report Status PENDING   Incomplete  CULTURE, BLOOD (ROUTINE X 2)     Status: None   Collection Time    07/05/13 11:24 PM      Result Value Range Status   Specimen Description BLOOD RIGHT ANTECUBITAL   Final   Special Requests BOTTLES DRAWN AEROBIC AND ANAEROBIC 5CC   Final   Culture  Setup Time     Final   Value: 07/06/2013 04:42     Performed at [facility]   Culture     Final   Value:        BLOOD CULTURE RECEIVED NO GROWTH TO DATE CULTURE WILL BE HELD FOR 5 DAYS BEFORE ISSUING A FINAL NEGATIVE REPORT     Performed at [facility]   Report Status PENDING   Incomplete  URINE CULTURE     Status: None   Collection Time    07/06/13  8:01  AM      Result Value Range Status   Specimen Description URINE, CATHETERIZED   Final   Special Requests NONE   Final   Culture  Setup Time     Final   Value: 07/06/2013 10:49     Performed at [facility]   Colony Count     Final   Value: 40,000 COLONIES/ML     Performed at [facility]   Culture     Final   Value: YEAST      Performed at [facility]   Report Status 07/07/2013 FINAL   Final  CULTURE, BLOOD (SINGLE)     Status: None   Collection Time    07/06/13  8:45 AM      Result Value Range Status   Specimen Description BLOOD RIGHT ARM   Final   Special Requests BOTTLES DRAWN AEROBIC ONLY 3CC    Final   Culture  Setup Time     Final   Value: 07/06/2013 10:49     Performed at [facility]   Culture     Final   Value:        BLOOD CULTURE RECEIVED NO GROWTH TO DATE CULTURE WILL BE HELD FOR 5 DAYS BEFORE ISSUING A FINAL NEGATIVE REPORT     Performed at [facility]   Report Status PENDING   Incomplete  MRSA PCR SCREENING     Status: None   Collection Time    07/06/13  9:25 AM      Result Value Range Status   MRSA by PCR NEGATIVE  NEGATIVE Final   Comment:            The GeneXpert MRSA Assay (FDA     approved for NASAL specimens     only), is one component of a     comprehensive MRSA colonization     surveillance program. It is not     intended to diagnose MRSA     infection nor to guide or     monitor treatment for     MRSA infections.  GRAM STAIN     Status: None   Collection Time    07/07/13  5:40 PM      Result Value Range Status   Specimen Description CSF CSF   Final   Special Requests NONE   Final   Gram Stain     Final   Value: NO ORGANISMS SEEN     NO WBC SEEN     Gram Stain Report Called to,Read Back By and Verified With: AYERS,C. AT 1958 ON 09.26.14 BY LOVE,T.   Report Status 07/07/2013 FINAL   Final  BODY FLUID CULTURE     Status: None   Collection Time    07/07/13  5:42 PM      Result Value Range Status   Specimen Description Body Fluid   Final   Special Requests NONE   Final   Gram Stain     Final   Value: NO WBC SEEN     NO ORGANISMS SEEN     Performed at [facility]   Culture     Final   Value: NO GROWTH     Performed at [facility]   Report Status PENDING   Incomplete    Coagulation Studies:  Recent Labs  07/07/13 1432   LABPROT 14.5  INR 1.15    Imaging: Dg Abd 1 View  07/09/2013   *RADIOLOGY REPORT*  Clinical Data: Band placement  ABDOMEN - 1 VIEW  Comparison: Prior abdominal radiograph earlier today at 11:49 a.m.  Findings: Frontal views of the abdomen demonstrate a weighted tip enteric feeding tube.  The tip of the tube overlies the right upper quadrant, likely within the first portion of the duodenum.  The bowel gas pattern is not obstructed.  No acute osseous abnormality. No large free air on this supine series.  IMPRESSION: The tip of the enteric feeding tube is likely in the first portion of the duodenum.   Original Report Authenticated By: Malachy Moan, M.D.   Dg Abd 1 View  07/09/2013   CLINICAL DATA:  Post feeding tube placement  EXAM: ABDOMEN - 1 VIEW  COMPARISON:  Portable exam 1149 hr compared to 07/05/2013  FINDINGS: Tip of feeding tube projects over distal stomach.  EKG leads project over chest and abdomen.  Normal bowel gas pattern.  Atelectasis versus infiltrate left lung base.  IMPRESSION: Tip of feeding tube projects over distal stomach.   Electronically Signed   By: Ulyses Southward M.D.   On: 07/09/2013 12:04    Medications:  I have reviewed the patient's current medications. Scheduled: . acyclovir  500 mg Intravenous Q8H  . antiseptic oral rinse  15 mL Mouth Rinse q12n4p  . cefTRIAXone (ROCEPHIN)  IV  2 g Intravenous Q12H  . chlorhexidine  15 mL Mouth Rinse BID  . famotidine (PEPCID) IV  20 mg Intravenous Daily  . insulin aspart  0-20 Units Subcutaneous Q4H  . insulin detemir  50 Units Subcutaneous BID  . latanoprost  1 drop Both Eyes QHS  . levETIRAcetam  500 mg Intravenous Q12H  . levothyroxine  37.5 mcg Intravenous Daily  . liothyronine  25 mcg Oral BID  . metoprolol  10 mg Intravenous Q6H  . metronidazole  500 mg Intravenous Q8H  . potassium phosphate IVPB (mmol)  10 mmol Intravenous Once  . sodium chloride  10-40 mL Intracatheter Q12H  . sodium chloride  3 mL Intravenous  Q12H  . vancomycin  750 mg Intravenous Q12H    Assessment/Plan: Patient is slowly improving but remains mildly focal.  No further seizures.  Recommendations: 1.  Continue Keppra and Acyclovir 2.  Continue seizure precautions 3.  Will continue to follow with you   LOS: 8 days   Thana Farr, MD [facility] [phone] 07/09/2013  3:07 PM

## 2013-07-09 NOTE — Progress Notes (Signed)
ANTIBIOTIC CONSULT NOTE - follow up  Pharmacy Consult for Vancomycin Indication: r/o meningitis  Allergies  Allergen Reactions  . Amoxicillin     REACTION: rash  . Ciprofloxacin     REACTION: hives  . Codeine     REACTION: nervous, shaky  . Duloxetine     REACTION: reaction not known  . Hydrochlorothiazide W-Triamterene     REACTION: hyponatremia  . Ticlopidine Hcl     REACTION: bleeding    Patient Measurements: Height: 5\' 2"  (157.5 cm) Weight: 196 lb 10.4 oz (89.2 kg) IBW/kg (Calculated) : 50.1  Vital Signs: Temp: 98.9 F (37.2 C) (09/28 0800) Temp src: Oral (09/28 0800) BP: 185/69 mmHg (09/28 0800) Pulse Rate: 68 (09/28 0800) Intake/Output from previous day: 09/27 0701 - 09/28 0700 In: 4850 [I.V.:2780; IV Piggyback:2070] Out: 2800 [Urine:2800] Intake/Output from this shift: Total I/O In: 450 [I.V.:300; IV Piggyback:150] Out: 300 [Urine:300]  Labs:  Recent Labs  07/07/13 0335  07/08/13 0340 07/08/13 1327 07/09/13 0340  WBC 4.6  --  5.0  --  6.4  HGB 12.3  --  12.4  --  12.6  PLT 164  --  132*  --  112*  CREATININE 1.07  < > 0.94 0.88 0.82  < > = values in this interval not displayed. Estimated Creatinine Clearance: 66.2 ml/min (by C-G formula based on Cr of 0.82).  Recent Labs  07/09/13 1000  VANCOTROUGH 19.7    Assessment: 72 yo F presented 9/20 with c/o generalized weakness and lethargy. Patient found acidotic and ARF (Scr 7.83). Rocephin was started for UTI, renal function improved through hospitalization but no improvement in mental status seen. Pt febrile 9//24, with nuchal rigidity, RUE jerking and worsening mental status. CT head neg, MRI head and EEG pending. Patient transfer to ICU 9/25. Decadron, Vanc, Rocephin, Ampicillin order for r/o meningitis. Unable to do LP given Aggrenox and Lovenox. Neuro, CCM consulted.   33 yoF presented 9/20 with c/o generalized weakness and lethargy. Patient found acidotic and ARF (Scr 7.83). Rocephin was started  for UTI, renal function improved through hospitalization but no improvement in mental status seen. Pt febrile 9//24, with nuchal rigidity, RUE jerking and worsening mental status. CT head neg, MRI head and EEG pending. Patient transfer to ICU 9/25. Decadron, Vanc, Rocephin, Ampicillin order for r/o meningitis. Unable to do LP given Aggrenox and Lovenox. Neuro, CCM consulted.   9/20 >> Rocephin >> 9/23 9/23 >> Flagyl (MD) >>  9/25 >> Vanc >> 9/25 >> Rocephin (MD) >> 9/25 >> Ampicillin (MD) >> 9/28 9/25 >> Acyclovir >>  Tmax: AF WBCs: wnl (on Decadron)  Renal: ARF resolved, SCr cont to improve. 66CG, 72N.  PCT = negative  9/20 Urine: E.coli - pan sens 9/21 Stool cx: +C.diff 9/24 Blood x 2: ngtd 9/25 Urine: yeast 9/25 Blood x 1: ngtd 9/26 CSF: ngtd, no WBCs.  Dose changes/drug level info: 9/25: Vanc changed from 1500 q24h to 1250 q24h d/t updated weight 9/26: Regimen hasn't started yet and renal fxn is improved, so change Vanc to 750 q12h.  9/27: Change Acyclovir to 500 IV q8h d/t improved CrCl. 9/28: VT 19.7, on 750 q12h. Cont same.  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 ID stopped ampicillin since doubts Listeria. Stopped decadron since CSF is neg. D4 Acyclovir 500 IV q8h r/o herpes encephalitis(used IBW at 10mg /kg IV q12h).  Goal of Therapy:  Vancomycin trough level 15-20 mcg/ml  Plan:  Cont Vanc 750mg  IV q12h.  Cont Acyclovir 500mg  IV q8h.  Continue Rocephin, and Flagyl as ordered by MD.  Charolotte Eke, PharmD, pager [phone]. 07/09/2013,12:05 PM.

## 2013-07-09 NOTE — Progress Notes (Signed)
Peripherally Inserted Central Catheter/Midline Placement  The IV Nurse has discussed with the patient and/or persons authorized to consent for the patient, the purpose of this procedure and the potential benefits and risks involved with this procedure.  The benefits include less needle sticks, lab draws from the catheter and patient may be discharged home with the catheter.  Risks include, but not limited to, infection, bleeding, blood clot (thrombus formation), and puncture of an artery; nerve damage and irregular heat beat.  Alternatives to this procedure were also discussed.  PICC/Midline Placement Documentation  PICC Triple Lumen 07/09/13 PICC Right Brachial (Active)   Phone consent with daughter Lenore Manner 07/09/2013, 9:53 AM

## 2013-07-09 NOTE — Consult Note (Addendum)
[facility]     Reason for Consult:meningoencephalitis    Referring Physician: Dr. Janee Morn  Principal Problem:   Acute encephalopathy Active Problems:   HYPOTHYROIDISM   DIABETES MELLITUS, TYPE II   HYPERLIPIDEMIA   UTI (urinary tract infection)   HTN (hypertension)   Anxiety and depression   CVA (cerebral infarction)   Weakness generalized   Dehydration   Acute renal failure   Failure to thrive   Diarrhea   Hyponatremia   Metabolic acidosis   Clostridium difficile colitis   Hypomagnesemia   Hypernatremia   Seizures   Meningitis, unspecified(322.9)   . acyclovir  500 mg Intravenous Q8H  . ampicillin (OMNIPEN) IV  2 g Intravenous Q4H  . antiseptic oral rinse  15 mL Mouth Rinse q12n4p  . cefTRIAXone (ROCEPHIN)  IV  2 g Intravenous Q12H  . chlorhexidine  15 mL Mouth Rinse BID  . dexamethasone  10 mg Intravenous Q6H  . famotidine (PEPCID) IV  20 mg Intravenous Daily  . insulin aspart  0-20 Units Subcutaneous Q4H  . insulin detemir  50 Units Subcutaneous BID  . latanoprost  1 drop Both Eyes QHS  . levETIRAcetam  500 mg Intravenous Q12H  . levothyroxine  37.5 mcg Intravenous Daily  . liothyronine  25 mcg Oral BID  . metoprolol  10 mg Intravenous Q6H  . metronidazole  500 mg Intravenous Q8H  . potassium chloride  10 mEq Intravenous Q1 Hr x 3  . potassium phosphate IVPB (mmol)  10 mmol Intravenous Once  . sodium chloride  10-40 mL Intracatheter Q12H  . sodium chloride  3 mL Intravenous Q12H  . vancomycin  750 mg Intravenous Q12H    Recommendations: I will d/c ampicillin, no signs of listeria No current inflammation with no WBCs on CSF, no S pneumonia so I will d/c decadron Await HSV pcr before deciding on other therapy  Assessment: She presented with severe dehydration, acute renal failure which has resolved but altered mental status perisisted.  Developed nuchal rigidity and started on empiric therapy.  CSF unrevealing but was done  on broad spectrum antibiotics.  It is really unclear if she is responding to therapy for meningitis or with improvement of C diff infection.  C diff seems to be responding to flagyl.    Antibiotics: Acyclovir day 4 Vancomycin day 4 Ceftriaxone day 4 Ampicillin day 4 Decadron day 4 Flagyl day 6  HPI: Regina [name] is a 72 y.o. female NH resident who presented on 9/20 with diarrhea, failure to thrive after 2 recent hospitializations for DKA and metabolic encephalopathy.  She was noted to be C diff positive and had significant dehydration with initial creatinine of 7.8 and started on metronidazole.  Creat responded with fluids but altered mental status persisted despite resolution of volume status.  She then developed notable nuchal rigidity and with concern for meningoencephalitis was started on broad antibiotics and acyclovir.  LP not done initially since she was on anticoagulation but done on day 3 of antibiotics and was negative for WBCs, normal protein and glucose elevated consistent with serum blood sugar.  No fever.  Patient now responding more than previously.  Diarrhea improving according to daughter.  MRI unrevealing but with motion artifact.     Review of Systems: unobtainable from the patient  Past Medical History  Diagnosis Date  . Hypertension   . Hypercholesteremia   . TIA (transient ischemic attack) 1990's    "mini strokes" (06/07/2013)  . Legally blind     "  both eyes; some vision in the right' (06/07/2013)  . Heart murmur   . Hypothyroidism   . Type II diabetes mellitus   . Anemia   . GERD (gastroesophageal reflux disease)   . Arthritis     "left leg" (06/07/2013)  . Depression   . Vulva cancer   . Altered mental status     "the first time I noticed any problem was 2 d ago" (06/07/2013)    History  Substance Use Topics  . Smoking status: Former Smoker -- 4.00 packs/day for 30 years    Types: Cigarettes    Quit date: 10/12/1992  . Smokeless tobacco: Never Used      Comment: 06/07/2013 "hasn't smoked since her stroke in the 1990's"  . Alcohol Use: No    Family History  Problem Relation Age of Onset  . Hypertension Mother   . Hyperlipidemia Mother   . Hyperlipidemia Father   . Hypertension Father   . Diabetes Father    Allergies  Allergen Reactions  . Amoxicillin     REACTION: rash  . Ciprofloxacin     REACTION: hives  . Codeine     REACTION: nervous, shaky  . Duloxetine     REACTION: reaction not known  . Hydrochlorothiazide W-Triamterene     REACTION: hyponatremia  . Ticlopidine Hcl     REACTION: bleeding    OBJECTIVE: Blood pressure 185/69, pulse 68, temperature 98.9 F (37.2 C), temperature source Oral, resp. rate 12, height 5\' 2"  (1.575 m), weight 196 lb 10.4 oz (89.2 kg), SpO2 98.00%. General: Awake, slowed Skin: no rashes Lungs: CTA B Cor: RRR with 2/6 SEM Abdomen: soft, nt, nd, +bs Ext: no edema  Microbiology: Recent Results (from the past 240 hour(s))  URINE CULTURE     Status: None   Collection Time    07/01/13 12:37 PM      Result Value Range Status   Specimen Description URINE, CATHETERIZED   Final   Special Requests NONE   Final   Culture  Setup Time     Final   Value: 07/01/2013 20:06     Performed at [facility] Count     Final   Value: >=100,000 COLONIES/ML     Performed at [facility]   Culture     Final   Value: ESCHERICHIA COLI     Performed at [facility]   Report Status 07/03/2013 FINAL   Final   Organism ID, Bacteria ESCHERICHIA COLI   Final  CULTURE, BLOOD (ROUTINE X 2)     Status: None   Collection Time    07/05/13 11:20 PM      Result Value Range Status   Specimen Description BLOOD LEFT HAND   Final   Special Requests BOTTLES DRAWN AEROBIC AND ANAEROBIC 3CC   Final   Culture  Setup Time     Final   Value: 07/06/2013 04:42     Performed at [facility]   Culture     Final   Value:        BLOOD CULTURE RECEIVED NO GROWTH TO DATE CULTURE WILL BE  HELD FOR 5 DAYS BEFORE ISSUING A FINAL NEGATIVE REPORT     Performed at [facility]   Report Status PENDING   Incomplete  CULTURE, BLOOD (ROUTINE X 2)     Status: None   Collection Time    07/05/13 11:24 PM      Result Value Range Status  Specimen Description BLOOD RIGHT ANTECUBITAL   Final   Special Requests BOTTLES DRAWN AEROBIC AND ANAEROBIC 5CC   Final   Culture  Setup Time     Final   Value: 07/06/2013 04:42     Performed at [facility]   Culture     Final   Value:        BLOOD CULTURE RECEIVED NO GROWTH TO DATE CULTURE WILL BE HELD FOR 5 DAYS BEFORE ISSUING A FINAL NEGATIVE REPORT     Performed at [facility]   Report Status PENDING   Incomplete  URINE CULTURE     Status: None   Collection Time    07/06/13  8:01 AM      Result Value Range Status   Specimen Description URINE, CATHETERIZED   Final   Special Requests NONE   Final   Culture  Setup Time     Final   Value: 07/06/2013 10:49     Performed at [facility] Count     Final   Value: 40,000 COLONIES/ML     Performed at [facility]   Culture     Final   Value: YEAST     Performed at [facility]   Report Status 07/07/2013 FINAL   Final  CULTURE, BLOOD (SINGLE)     Status: None   Collection Time    07/06/13  8:45 AM      Result Value Range Status   Specimen Description BLOOD RIGHT ARM   Final   Special Requests BOTTLES DRAWN AEROBIC ONLY 3CC    Final   Culture  Setup Time     Final   Value: 07/06/2013 10:49     Performed at [facility]   Culture     Final   Value:        BLOOD CULTURE RECEIVED NO GROWTH TO DATE CULTURE WILL BE HELD FOR 5 DAYS BEFORE ISSUING A FINAL NEGATIVE REPORT     Performed at [facility]   Report Status PENDING   Incomplete  MRSA PCR SCREENING     Status: None   Collection Time    07/06/13  9:25 AM      Result Value Range Status   MRSA by PCR NEGATIVE  NEGATIVE Final   Comment:            The GeneXpert MRSA  Assay (FDA     approved for NASAL specimens     only), is one component of a     comprehensive MRSA colonization     surveillance program. It is not     intended to diagnose MRSA     infection nor to guide or     monitor treatment for     MRSA infections.  GRAM STAIN     Status: None   Collection Time    07/07/13  5:40 PM      Result Value Range Status   Specimen Description CSF CSF   Final   Special Requests NONE   Final   Gram Stain     Final   Value: NO ORGANISMS SEEN     NO WBC SEEN     Gram Stain Report Called to,Read Back By and Verified With: AYERS,C. AT 1958 ON 09.26.14 BY LOVE,T.   Report Status 07/07/2013 FINAL   Final  BODY FLUID CULTURE     Status: None   Collection Time    07/07/13  5:42 PM  Result Value Range Status   Specimen Description Body Fluid   Final   Special Requests NONE   Final   Gram Stain     Final   Value: NO WBC SEEN     NO ORGANISMS SEEN     Performed at [facility]   Culture     Final   Value: NO GROWTH     Performed at [facility]   Report Status PENDING   Incomplete    COMER, Molly Maduro, MD [facility] [email or web address] [phone] pager  [phone] cell 07/09/2013, 10:49 AM

## 2013-07-09 NOTE — Progress Notes (Signed)
[facility] PROGRESS NOTE  Regina [name] AVW:[id number] DOB: 1940/12/07 DOA: 07/01/2013 PCP: Michiel Sites, MD  Assessment/Plan: #1 acute encephalopathy Questionable etiology. May be secondary to seizures in the setting of probable bacterial meningitis with patient with nuchal rigidity and a rectal temperature of 102 with altered mental status. Per nursing patient with right upper extremity jerking motions 4 nights ago. No further jerking movements. Patient alert and following some commands. Wiggles toes and squeezes hands.  CT of the head was negative. MRI of the head is negative for any acute infarct. EEG is negative for any epileptiform discharges. Patient was hypokalemicand potassium repleted.  Potassium today is 3.6. Keep potassium greater than 4.  Placed on Ativan 1 mg IV every 2 hours when necessary. Neuro checks every 4 hours. S/P lumbar puncture, however had been on antibiotic prior to LP. CSF cultures neg. CSF HSV cultures pending.  Aggrenox and Lovenox which have been discontinued. Repeat blood cultures pending. Replete electrolytes. Continue empirically on IV vancomycin, Rocephin, ampicillin, acyclovir, Decadron. Continue IV Keppra. Neurology following and appreciate input and recommendations.ID consult for management of antibiotics.  #2 probable seizure Patient noted to have jerking motion of the right upper extremity mostly throughout the night  2 days ago in addition to altered mental status. Patient's eyes rolled and deviated to the right. Patient also noted to have a fever. CT of the head was negative. MRI of the head was negative. EEG was negative. Significant improvement in her jerking in the right upper extremity. Patient was loaded with IV Keppra and is on Keppra IV every 12 hours. Cultures are pending. Continue empiric IV vancomycin, Rocephin, ampicillin, acyclovir and Decadron for possible meningitis. Neuro checks. Neurology following and appreciate input and  recommendations.   #3 possible meningitis/CSF infection  Patient noted to have a rectal temperature 102  3 days ago. Fever curve trending down. Patient with significant nuchal rigidity. Patient with altered mental status, but slowly improving. Blood cultures are pending. Patient s/p LP .  MRI head negative. EEG negative. Culture from CSF neg. HSV CSF cultures pending, patient however was on empiric IV antibiotics for 2-3 days prior to LP. Continue to hold Aggrenox and Lovenox for now. Mental status slowly improving as patient ff some commands. Continue IV vancomycin, Rocephin, ampicillin, acyclovir and Decadron to cover empirically for CSF infection. Neurology following and appreciate your input and recommendations. Will consult with ID for antibiotic recommendations.  #4 acute renal failure Likely secondary to volume depletion and hypotension in the setting of ARB. Clinical improvement. Renal function with creatinine now of 0.82. Good urine output with 2800 cc over the past 24 hours. Continue Foley catheter. Bicarbonate drip has been discontinued as acidosis has resolved. Continue to hold ARB and metformin.   #5 hypokalemia/hypomagnesemia Likely secondary to GI losses from C. difficile colitis and increased urinary output. Replete.   #6 C. difficile colitis On IV Flagyl. Follow.  #7 hypernatremia Sodium levels improved and now at 144. Change IVF to 1/2 NS and follow.   #8 Escherichia coli UTI Patient had received IV Rocephin x3 days. D/C IV Rocephin 3 days ago. Resumed IV Rocephin in light of concern for bacterial meningitis.   #9 hypertension Increase IV Lopressor to 10 mg q8.  #10 history of CVA Aggrenox held for lumbar puncture 2 days ago. Patient not fully alert to take orals. ?? Rectal ASA .Will defer to neurology.  #11 diabetes mellitus Hemoglobin A1c is 9.6. CBGs have ranged from 230-261. Patient on decadron and D5.  Increase Levemir to 50 units subcutaneous twice a day. Continue  sliding scale insulin.  #12 hypothyroidism Continue Synthroid and Cytomel.  #13 anxiety/depression Stable.  #14 Nuitrition Place panda tube. Nuitrition consult for feeding.  #15 prophylaxis  Pepcid for GI prophylaxis. SCDs for DVT prophylaxis.  Code Status: DNR Family Communication: Updated daughter at bedside. Disposition Plan: Transfer to SDU.   Consultants:  Nephrology: Dr. Eliott Nine 07/02/2013:  Neurology : Dr Thad Ranger 07/06/13  ID pending  Procedures:  CT head 07/05/2013  KUB 07/05/2013  KUB 07/01/2013  Chest x-ray 07/01/2013  MRI head 07/06/13  EEG 07/06/13  LP 07/07/13  PICC line 07/09/13  Antibiotics:  IV Rocephin 07/01/2013--> 07/05/2013  IV Flagyl 07/04/2013-  IV vancomycin 07/06/2013  IV Rocephin 07/06/2013  IV ampicillin 07/06/2013  IV Acyclovir 07/06/13  HPI/Subjective: Patient is lethargic. Eyes open. Following some commands. No RUE jerking motions.  Objective: Filed Vitals:   07/09/13 0800  BP: 185/69  Pulse: 68  Temp: 98.9 F (37.2 C)  Resp: 12    Intake/Output Summary (Last 24 hours) at 07/09/13 0949 Last data filed at 07/09/13 0900  Gross per 24 hour  Intake   4475 ml  Output   2750 ml  Net   1725 ml   Filed Weights   07/07/13 0400 07/08/13 0400 07/09/13 0400  Weight: 83.4 kg (183 lb 13.8 oz) 89.2 kg (196 lb 10.4 oz) 89.2 kg (196 lb 10.4 oz)    Exam:   General:  Lethargic. Eyes open.   Neck: Rigid neck  Cardiovascular: RRR  Respiratory: CTAB.  Abdomen: Soft/NT/ND/+BS  Musculoskeletal: No c/c/e  Neuro: Patient is lethargic minimally responding to verbal stimuli. Eyes open. Squeezed my hands and wiggled toes when asked to. Neck rigidity.  Data Reviewed: Basic Metabolic Panel:  Recent Labs Lab 07/05/13 0420 07/05/13 0820 07/06/13 0443  07/07/13 0335 07/07/13 1352 07/08/13 0340 07/08/13 1327 07/09/13 0340  NA 152* 147* 152*  < > 151* 154* 153* 146* 144  K 2.6* 2.7* 2.6*  < > 3.5 3.8 3.8 4.2 3.6  CL  95* 92* 96  < > 106 108 109 103 101  CO2 29 27 25   < > 32 35* 33* 32 34*  GLUCOSE 294* 328* 474*  < > 412* 407* 365* 300* 261*  BUN 36* 32* 25*  < > 23 23 18 17 14   CREATININE 2.01* 1.65* 1.33*  < > 1.07 1.12* 0.94 0.88 0.82  CALCIUM 7.8* 7.8* 7.9*  < > 7.8* 7.8* 7.5* 7.4* 7.5*  MG  --  1.3* 2.3  --   --   --  1.2*  --  1.5  PHOS 3.0  --  2.3  --  1.8*  --  2.8  2.7  --  2.1*  < > = values in this interval not displayed. Liver Function Tests:  Recent Labs Lab 07/06/13 0443 07/06/13 0830 07/07/13 0335 07/08/13 0340 07/09/13 0340  AST  --  14  --   --   --   ALT  --  10  --   --   --   ALKPHOS  --  67  --   --   --   BILITOT  --  0.3  --   --   --   PROT  --  6.4  --   --   --   ALBUMIN 3.2* 3.0* 2.8* 2.7* 2.6*   No results found for this basename: LIPASE, AMYLASE,  in the last 168 hours No results found for  this basename: AMMONIA,  in the last 168 hours CBC:  Recent Labs Lab 07/06/13 0443 07/06/13 0845 07/07/13 0335 07/08/13 0340 07/09/13 0340  WBC 5.8 5.0 4.6 5.0 6.4  NEUTROABS  --  2.9 3.2  --   --   HGB 13.1 13.0 12.3 12.4 12.6  HCT 38.9 38.6 37.0 37.4 38.2  MCV 92.0 92.8 93.2 94.0 92.9  PLT 236 229 164 132* 112*   Cardiac Enzymes: No results found for this basename: CKTOTAL, CKMB, CKMBINDEX, TROPONINI,  in the last 168 hours BNP (last 3 results) No results found for this basename: PROBNP,  in the last 8760 hours CBG:  Recent Labs Lab 07/08/13 1140 07/08/13 1627 07/08/13 1929 07/09/13 0113 07/09/13 0827  GLUCAP 323* 257* 256* 261* 230*    Recent Results (from the past 240 hour(s))  URINE CULTURE     Status: None   Collection Time    07/01/13 12:37 PM      Result Value Range Status   Specimen Description URINE, CATHETERIZED   Final   Special Requests NONE   Final   Culture  Setup Time     Final   Value: 07/01/2013 20:06     Performed at [facility] Count     Final   Value: >=100,000 COLONIES/ML     Performed at [facility]   Culture     Final   Value: ESCHERICHIA COLI     Performed at [facility]   Report Status 07/03/2013 FINAL   Final   Organism ID, Bacteria ESCHERICHIA COLI   Final  CULTURE, BLOOD (ROUTINE X 2)     Status: None   Collection Time    07/05/13 11:20 PM      Result Value Range Status   Specimen Description BLOOD LEFT HAND   Final   Special Requests BOTTLES DRAWN AEROBIC AND ANAEROBIC 3CC   Final   Culture  Setup Time     Final   Value: 07/06/2013 04:42     Performed at [facility]   Culture     Final   Value:        BLOOD CULTURE RECEIVED NO GROWTH TO DATE CULTURE WILL BE HELD FOR 5 DAYS BEFORE ISSUING A FINAL NEGATIVE REPORT     Performed at [facility]   Report Status PENDING   Incomplete  CULTURE, BLOOD (ROUTINE X 2)     Status: None   Collection Time    07/05/13 11:24 PM      Result Value Range Status   Specimen Description BLOOD RIGHT ANTECUBITAL   Final   Special Requests BOTTLES DRAWN AEROBIC AND ANAEROBIC 5CC   Final   Culture  Setup Time     Final   Value: 07/06/2013 04:42     Performed at [facility]   Culture     Final   Value:        BLOOD CULTURE RECEIVED NO GROWTH TO DATE CULTURE WILL BE HELD FOR 5 DAYS BEFORE ISSUING A FINAL NEGATIVE REPORT     Performed at [facility]   Report Status PENDING   Incomplete  URINE CULTURE     Status: None   Collection Time    07/06/13  8:01 AM      Result Value Range Status   Specimen Description URINE, CATHETERIZED   Final   Special Requests NONE   Final   Culture  Setup Time  Final   Value: 07/06/2013 10:49     Performed at [facility] Count     Final   Value: 40,000 COLONIES/ML     Performed at [facility]   Culture     Final   Value: YEAST     Performed at [facility]   Report Status 07/07/2013 FINAL   Final  CULTURE, BLOOD (SINGLE)     Status: None   Collection Time    07/06/13  8:45 AM      Result Value Range Status    Specimen Description BLOOD RIGHT ARM   Final   Special Requests BOTTLES DRAWN AEROBIC ONLY 3CC    Final   Culture  Setup Time     Final   Value: 07/06/2013 10:49     Performed at [facility]   Culture     Final   Value:        BLOOD CULTURE RECEIVED NO GROWTH TO DATE CULTURE WILL BE HELD FOR 5 DAYS BEFORE ISSUING A FINAL NEGATIVE REPORT     Performed at [facility]   Report Status PENDING   Incomplete  MRSA PCR SCREENING     Status: None   Collection Time    07/06/13  9:25 AM      Result Value Range Status   MRSA by PCR NEGATIVE  NEGATIVE Final   Comment:            The GeneXpert MRSA Assay (FDA     approved for NASAL specimens     only), is one component of a     comprehensive MRSA colonization     surveillance program. It is not     intended to diagnose MRSA     infection nor to guide or     monitor treatment for     MRSA infections.  GRAM STAIN     Status: None   Collection Time    07/07/13  5:40 PM      Result Value Range Status   Specimen Description CSF CSF   Final   Special Requests NONE   Final   Gram Stain     Final   Value: NO ORGANISMS SEEN     NO WBC SEEN     Gram Stain Report Called to,Read Back By and Verified With: AYERS,C. AT 1958 ON 09.26.14 BY LOVE,T.   Report Status 07/07/2013 FINAL   Final  BODY FLUID CULTURE     Status: None   Collection Time    07/07/13  5:42 PM      Result Value Range Status   Specimen Description Body Fluid   Final   Special Requests NONE   Final   Gram Stain     Final   Value: NO WBC SEEN     NO ORGANISMS SEEN     Performed at [facility]   Culture     Final   Value: NO GROWTH     Performed at [facility]   Report Status PENDING   Incomplete     Studies: No results found.  Scheduled Meds: . acyclovir  500 mg Intravenous Q8H  . ampicillin (OMNIPEN) IV  2 g Intravenous Q4H  . antiseptic oral rinse  15 mL Mouth Rinse q12n4p  . cefTRIAXone (ROCEPHIN)  IV  2 g Intravenous Q12H  .  chlorhexidine  15 mL Mouth Rinse BID  . dexamethasone  10 mg Intravenous Q6H  .  famotidine (PEPCID) IV  20 mg Intravenous Daily  . insulin aspart  0-20 Units Subcutaneous Q4H  . insulin detemir  45 Units Subcutaneous BID  . latanoprost  1 drop Both Eyes QHS  . levETIRAcetam  500 mg Intravenous Q12H  . levothyroxine  37.5 mcg Intravenous Daily  . liothyronine  25 mcg Oral BID  . metoprolol  10 mg Intravenous Q6H  . metronidazole  500 mg Intravenous Q8H  . potassium chloride  10 mEq Intravenous Q1 Hr x 3  . potassium phosphate IVPB (mmol)  10 mmol Intravenous Once  . sodium chloride  3 mL Intravenous Q12H  . vancomycin  750 mg Intravenous Q12H   Continuous Infusions: . dextrose 150 mL/hr at 07/09/13 1610    Principal Problem:   Acute encephalopathy Active Problems:   HYPOTHYROIDISM   DIABETES MELLITUS, TYPE II   HYPERLIPIDEMIA   UTI (urinary tract infection)   HTN (hypertension)   Anxiety and depression   CVA (cerebral infarction)   Weakness generalized   Dehydration   Acute renal failure   Failure to thrive   Diarrhea   Hyponatremia   Metabolic acidosis   Clostridium difficile colitis   Hypomagnesemia   Hypernatremia   Seizures   Meningitis, unspecified(322.9)    Time spent: > 35 mins    [facility]  [facility] Pager [phone]. If 7PM-7AM, please contact night-coverage at [email or web address], password [facility] 07/09/2013, 9:49 AM  LOS: 8 days

## 2013-07-10 DIAGNOSIS — E43 Unspecified severe protein-calorie malnutrition: Secondary | ICD-10-CM | POA: Insufficient documentation

## 2013-07-10 LAB — GLUCOSE, CAPILLARY
Glucose-Capillary: 279 mg/dL — ABNORMAL HIGH (ref 70–99)
Glucose-Capillary: 164 mg/dL — ABNORMAL HIGH (ref 70–99)
Glucose-Capillary: 161 mg/dL — ABNORMAL HIGH (ref 70–99)

## 2013-07-10 LAB — RENAL FUNCTION PANEL
CO2: 31 mEq/L (ref 19–32)
Chloride: 97 mEq/L (ref 96–112)
GFR calc Af Amer: >90 mL/min (ref >90)
GFR calc non Af Amer: 85 mL/min — ABNORMAL LOW (ref >90)
Glucose, Bld: 202 mg/dL — ABNORMAL HIGH (ref 70–99)
Sodium: 136 mEq/L (ref 135–145)

## 2013-07-10 LAB — CBC
Hemoglobin: 12 g/dL (ref 12.0–15.0)
MCH: 30.9 pg (ref 26.0–34.0)
Platelets: 73 10*3/uL — ABNORMAL LOW (ref 150–400)
RBC: 3.88 MIL/uL (ref 3.87–5.11)
RDW: 14 % (ref 11.5–15.5)
WBC: 7.5 10*3/uL (ref 4.0–10.5)

## 2013-07-10 LAB — MAGNESIUM: Magnesium: 1.5 mg/dL (ref 1.5–2.5)

## 2013-07-10 MED ORDER — LEVOTHYROXINE SODIUM 75 MCG PO TABS
75.0000 ug | ORAL_TABLET | Freq: Every day | ORAL | Status: DC
  Administered 2013-07-10 – 2013-07-22 (×13): 75 ug via NASOGASTRIC
  Filled 2013-07-10 (×16): qty 1

## 2013-07-10 MED ORDER — POTASSIUM CHLORIDE 20 MEQ/15ML (10%) PO LIQD
40.0000 meq | Freq: Once | ORAL | Status: AC
  Administered 2013-07-10: 40 meq
  Filled 2013-07-10: qty 30

## 2013-07-10 MED ORDER — VITAL AF 1.2 CAL PO LIQD
1000.0000 mL | ORAL | Status: DC
  Administered 2013-07-10: 1000 mL
  Filled 2013-07-10 (×2): qty 1000

## 2013-07-10 MED ORDER — POTASSIUM CHLORIDE 10 MEQ/100ML IV SOLN
10.0000 meq | INTRAVENOUS | Status: AC
  Administered 2013-07-10 (×4): 10 meq via INTRAVENOUS
  Filled 2013-07-10 (×4): qty 100

## 2013-07-10 MED ORDER — POTASSIUM CHLORIDE 2 MEQ/ML IV SOLN
INTRAVENOUS | Status: DC
  Administered 2013-07-10: 09:00:00 via INTRAVENOUS
  Filled 2013-07-10 (×5): qty 1000

## 2013-07-10 MED ORDER — METRONIDAZOLE 50 MG/ML ORAL SUSPENSION
500.0000 mg | Freq: Three times a day (TID) | ORAL | Status: DC
  Filled 2013-07-10 (×3): qty 10

## 2013-07-10 MED ORDER — LIOTHYRONINE SODIUM 25 MCG PO TABS
25.0000 ug | ORAL_TABLET | Freq: Two times a day (BID) | ORAL | Status: DC
  Administered 2013-07-10 – 2013-07-21 (×23): 25 ug via NASOGASTRIC
  Filled 2013-07-10 (×26): qty 1

## 2013-07-10 MED ORDER — METRONIDAZOLE 500 MG PO TABS
500.0000 mg | ORAL_TABLET | Freq: Three times a day (TID) | ORAL | Status: DC
  Administered 2013-07-10 – 2013-07-11 (×4): 500 mg
  Filled 2013-07-10 (×6): qty 1

## 2013-07-10 MED ORDER — MAGNESIUM SULFATE 50 % IJ SOLN
3.0000 g | Freq: Once | INTRAVENOUS | Status: AC
  Administered 2013-07-10: 3 g via INTRAVENOUS
  Filled 2013-07-10: qty 6

## 2013-07-10 MED ORDER — METOPROLOL TARTRATE 1 MG/ML IV SOLN
INTRAVENOUS | Status: AC
  Filled 2013-07-10: qty 5

## 2013-07-10 MED ORDER — METOPROLOL TARTRATE 1 MG/ML IV SOLN
5.0000 mg | Freq: Once | INTRAVENOUS | Status: AC
  Administered 2013-07-10: 5 mg via INTRAVENOUS

## 2013-07-10 NOTE — Progress Notes (Signed)
NUTRITION FOLLOW UP  Intervention:   Increase Vital 1.2 10 ml every 12 hours to new goal rate of 55 ml/hr.  Discontinue 30 ml Prostat BID.   New tube feeding regimen will provide 1584 kcal (100% of estimated needs), 101 grams of protein (100% of estimated needs), and 1071 ml of H2O.  Pt will require additional free water once IVF discontinued. Diet per SLP. Recommend monitoring of magnesium, potassium, and phosphorus daily until goal rate for enteral nutrition is reached; MD to replete as needed, as pt is at risk for refeeding syndrome given ongoing very poor po intake. RD to continue to follow nutrition care plan.  Nutrition Dx:   Inadequate oral intake related to poor appetite as evidenced by ongoing weight loss and poor meal completion; unmet  Goal:   Pt to meet >/= 90% of their estimated nutrition needs; unmet  Monitor:   Weight, labs, I/O's, toleration of TF  Assessment:   PMHx significant for DM2, retinopathy, legal blindness, HTN, CVA, hypothyroidism, anxiety and depression. Noted pt with 2 recent admissions to [facility] within the past month. Pt was d/c'd to rehab for about 3 weeks after hospitalization. Pt developed diarrhea, poor appetite, and overall decline x 9 days at [facility]. Family thought decline was 2/2 lack of care at SNF, pt was then sent home. Remains weak and bed bound. Reported weight loss of 25 lb x 1 month.  Pt found positive for C. Difficile on 9/23. Pt transferred to ICU on 9/25 for fever and possible seizure activity. Per neuro, pt has further improved since yesterday.  Pt's weight was 234 lbs on 9/22 and has since dropped to 196 lbs 9/28. This reflects a 16% weight loss in less than 1 week. Refeeding syndrome labs reviewed: potassium trending down and is now low at 3; phosphorus now repleted and WNL; magnesium remains WNL.  Presently with panda tube (tip is likely in the first portion of the duodenum, per radiology report.) Currently ordered for Vital AF 1.2 at 35 ml/hr  with 30 ml Prostat via tube BID. Goal regimen provides: 1208 kcal, 93 grams protein, and 680 ml free water. RN reports pt is tolerating regimen well at this time.  Also receiving NS with KCl 40 mEq infusion at 100 ml/hr.  Pt meets criteria for severe MALNUTRITION in the context of chronic illness as evidenced by intake of <75% x at least 1 month and wt loss of >5% x 1 month.  Height: Ht Readings from Last 1 Encounters:  07/01/13 5\' 2"  (1.575 m)    Weight Status:   Wt Readings from Last 1 Encounters:  07/10/13 207 lb 3.7 oz (94 kg)    Re-estimated needs:  Kcal: 1500-1650 Protein: 95-105 g Fluid: Per MD  Skin: WNL  Diet Order: NPO   Intake/Output Summary (Last 24 hours) at 07/10/13 1323 Last data filed at 07/10/13 0900  Gross per 24 hour  Intake   4770 ml  Output   1200 ml  Net   3570 ml    Last BM: 9/27   Labs:   Recent Labs Lab 07/08/13 0340 07/08/13 1327 07/09/13 0340 07/10/13 0515  NA 153* 146* 144 136  K 3.8 4.2 3.6 3.0*  CL 109 103 101 97  CO2 33* 32 34* 31  BUN 18 17 14 15   CREATININE 0.94 0.88 0.82 0.71  CALCIUM 7.5* 7.4* 7.5* 7.2*  MG 1.2*  --  1.5 1.5  PHOS 2.8  2.7  --  2.1* 2.3  2.3  GLUCOSE 365* 300* 261* 202*    CBG (last 3)   Recent Labs  07/10/13 0047 07/10/13 0400 07/10/13 0830  GLUCAP 279* 224* 158*    Scheduled Meds: . acyclovir  500 mg Intravenous Q8H  . antiseptic oral rinse  15 mL Mouth Rinse q12n4p  . cefTRIAXone (ROCEPHIN)  IV  2 g Intravenous Q12H  . chlorhexidine  15 mL Mouth Rinse BID  . famotidine (PEPCID) IV  20 mg Intravenous Daily  . feeding supplement  30 mL Per Tube q12n4p  . feeding supplement (VITAL AF 1.2 CAL)  1,000 mL Per Tube Q24H  . insulin aspart  0-20 Units Subcutaneous Q4H  . insulin detemir  45 Units Subcutaneous BID  . latanoprost  1 drop Both Eyes QHS  . levETIRAcetam  500 mg Intravenous Q12H  . levothyroxine  75 mcg Per NG tube QAC breakfast  . liothyronine  25 mcg Per NG tube BID  .  metoprolol  10 mg Intravenous Q6H  . metroNIDAZOLE  500 mg Per Tube Q8H  . sodium chloride  10-40 mL Intracatheter Q12H  . sodium chloride  3 mL Intravenous Q12H  . vancomycin  750 mg Intravenous Q12H    Continuous Infusions: . sodium chloride 0.9 % 1,000 mL with potassium chloride 40 mEq infusion 100 mL/hr at 07/10/13 0830    Ebbie Latus RD, LDN

## 2013-07-10 NOTE — Progress Notes (Signed)
Subjective: Patient continues to improve.  Today talking more and continues to follow commands.  No further seizure activity noted.    Objective: Current vital signs: BP 139/65  Pulse 74  Temp(Src) 97.7 F (36.5 C) (Oral)  Resp 14  Ht 5\' 2"  (1.575 m)  Wt 94 kg (207 lb 3.7 oz)  BMI 37.89 kg/m2  SpO2 97% Vital signs in last 24 hours: Temp:  [97.7 F (36.5 C)-98.9 F (37.2 C)] 97.7 F (36.5 C) (09/29 0800) Pulse Rate:  [69-109] 74 (09/29 1400) Resp:  [10-18] 14 (09/29 1400) BP: (123-148)/(44-69) 139/65 mmHg (09/29 1400) SpO2:  [94 %-98 %] 97 % (09/29 1400) Weight:  [94 kg (207 lb 3.7 oz)] 94 kg (207 lb 3.7 oz) (09/29 0000)  Intake/Output from previous day: 09/28 0701 - 09/29 0700 In: 6332 [P.O.:1000; I.V.:3450; IV Piggyback:1882] Out: 1700 [Urine:1700] Intake/Output this shift: Total I/O In: 1856.2 [I.V.:550; NG/GT:750.2; IV Piggyback:556] Out: 650 [Urine:650] Nutritional status: NPO  Neurologic Exam: Mental Status:  Eyes open.  Speech is fluent.  Follows simple commands. Is able to keep her eyes open.  Cranial Nerves:  II: Focus on examiner on either side of the bed, pupils right 3 mm, left 3 mm,and reactive bilaterally.  III,IV,VI: Control and instrumentation engineer around the room  V,VII: corneal reflex intact bilaterally,  VIII: patient does not respond to verbal stimuli  IX,X: gag reflex reduced, XI: trapezius strength unable to test bilaterally but patient does move head from side-to-side spontaneously  XII: midline tongue extension  Motor:  Moves all extremities to command and antigravity throughout Sensory:  Respond to noxious stimuli in all extremities.  Deep Tendon Reflexes:  2+ in the upper extremities and 1+ in the lower extremities  Plantars:  downgoing on the right and mute on the left    Lab Results: Basic Metabolic Panel:  Recent Labs Lab 07/05/13 0820 07/06/13 0443  07/07/13 0335 07/07/13 1352 07/08/13 0340 07/08/13 1327 07/09/13 0340 07/10/13 0515  NA  147* 152*  < > 151* 154* 153* 146* 144 136  K 2.7* 2.6*  < > 3.5 3.8 3.8 4.2 3.6 3.0*  CL 92* 96  < > 106 108 109 103 101 97  CO2 27 25  < > 32 35* 33* 32 34* 31  GLUCOSE 328* 474*  < > 412* 407* 365* 300* 261* 202*  BUN 32* 25*  < > 23 23 18 17 14 15   CREATININE 1.65* 1.33*  < > 1.07 1.12* 0.94 0.88 0.82 0.71  CALCIUM 7.8* 7.9*  < > 7.8* 7.8* 7.5* 7.4* 7.5* 7.2*  MG 1.3* 2.3  --   --   --  1.2*  --  1.5 1.5  PHOS  --  2.3  --  1.8*  --  2.8  2.7  --  2.1* 2.3  2.3  < > = values in this interval not displayed.  Liver Function Tests:  Recent Labs Lab 07/06/13 0830 07/07/13 0335 07/08/13 0340 07/09/13 0340 07/10/13 0515  AST 14  --   --   --   --   ALT 10  --   --   --   --   ALKPHOS 67  --   --   --   --   BILITOT 0.3  --   --   --   --   PROT 6.4  --   --   --   --   ALBUMIN 3.0* 2.8* 2.7* 2.6* 2.2*   No results found for this basename: LIPASE,  AMYLASE,  in the last 168 hours No results found for this basename: AMMONIA,  in the last 168 hours  CBC:  Recent Labs Lab 07/06/13 0845 07/07/13 0335 07/08/13 0340 07/09/13 0340 07/10/13 0515  WBC 5.0 4.6 5.0 6.4 7.5  NEUTROABS 2.9 3.2  --   --   --   HGB 13.0 12.3 12.4 12.6 12.0  HCT 38.6 37.0 37.4 38.2 35.2*  MCV 92.8 93.2 94.0 92.9 90.7  PLT 229 164 132* 112* 73*    Cardiac Enzymes: No results found for this basename: CKTOTAL, CKMB, CKMBINDEX, TROPONINI,  in the last 168 hours  Lipid Panel: No results found for this basename: CHOL, TRIG, HDL, CHOLHDL, VLDL, LDLCALC,  in the last 168 hours  CBG:  Recent Labs Lab 07/09/13 2055 07/10/13 0047 07/10/13 0400 07/10/13 0830 07/10/13 1248  GLUCAP 256* 279* 224* 158* 158*    Microbiology: Results for orders placed during the hospital encounter of 07/01/13  URINE CULTURE     Status: None   Collection Time    07/01/13 12:37 PM      Result Value Range Status   Specimen Description URINE, CATHETERIZED   Final   Special Requests NONE   Final   Culture  Setup  Time     Final   Value: 07/01/2013 20:06     Performed at [facility] Count     Final   Value: >=100,000 COLONIES/ML     Performed at [facility]   Culture     Final   Value: ESCHERICHIA COLI     Performed at [facility]   Report Status 07/03/2013 FINAL   Final   Organism ID, Bacteria ESCHERICHIA COLI   Final  CULTURE, BLOOD (ROUTINE X 2)     Status: None   Collection Time    07/05/13 11:20 PM      Result Value Range Status   Specimen Description BLOOD LEFT HAND   Final   Special Requests BOTTLES DRAWN AEROBIC AND ANAEROBIC 3CC   Final   Culture  Setup Time     Final   Value: 07/06/2013 04:42     Performed at [facility]   Culture     Final   Value:        BLOOD CULTURE RECEIVED NO GROWTH TO DATE CULTURE WILL BE HELD FOR 5 DAYS BEFORE ISSUING A FINAL NEGATIVE REPORT     Performed at [facility]   Report Status PENDING   Incomplete  CULTURE, BLOOD (ROUTINE X 2)     Status: None   Collection Time    07/05/13 11:24 PM      Result Value Range Status   Specimen Description BLOOD RIGHT ANTECUBITAL   Final   Special Requests BOTTLES DRAWN AEROBIC AND ANAEROBIC 5CC   Final   Culture  Setup Time     Final   Value: 07/06/2013 04:42     Performed at [facility]   Culture     Final   Value:        BLOOD CULTURE RECEIVED NO GROWTH TO DATE CULTURE WILL BE HELD FOR 5 DAYS BEFORE ISSUING A FINAL NEGATIVE REPORT     Performed at [facility]   Report Status PENDING   Incomplete  URINE CULTURE     Status: None   Collection Time    07/06/13  8:01 AM      Result Value Range Status   Specimen Description URINE,  CATHETERIZED   Final   Special Requests NONE   Final   Culture  Setup Time     Final   Value: 07/06/2013 10:49     Performed at [facility] Count     Final   Value: 40,000 COLONIES/ML     Performed at [facility]   Culture     Final   Value: YEAST     Performed at [facility]   Report Status 07/07/2013 FINAL   Final  CULTURE, BLOOD (SINGLE)     Status: None   Collection Time    07/06/13  8:45 AM      Result Value Range Status   Specimen Description BLOOD RIGHT ARM   Final   Special Requests BOTTLES DRAWN AEROBIC ONLY 3CC    Final   Culture  Setup Time     Final   Value: 07/06/2013 10:49     Performed at [facility]   Culture     Final   Value:        BLOOD CULTURE RECEIVED NO GROWTH TO DATE CULTURE WILL BE HELD FOR 5 DAYS BEFORE ISSUING A FINAL NEGATIVE REPORT     Performed at [facility]   Report Status PENDING   Incomplete  MRSA PCR SCREENING     Status: None   Collection Time    07/06/13  9:25 AM      Result Value Range Status   MRSA by PCR NEGATIVE  NEGATIVE Final   Comment:            The GeneXpert MRSA Assay (FDA     approved for NASAL specimens     only), is one component of a     comprehensive MRSA colonization     surveillance program. It is not     intended to diagnose MRSA     infection nor to guide or     monitor treatment for     MRSA infections.  GRAM STAIN     Status: None   Collection Time    07/07/13  5:40 PM      Result Value Range Status   Specimen Description CSF CSF   Final   Special Requests NONE   Final   Gram Stain     Final   Value: NO ORGANISMS SEEN     NO WBC SEEN     Gram Stain Report Called to,Read Back By and Verified With: AYERS,C. AT 1958 ON 09.26.14 BY LOVE,T.   Report Status 07/07/2013 FINAL   Final  BODY FLUID CULTURE     Status: None   Collection Time    07/07/13  5:42 PM      Result Value Range Status   Specimen Description Body Fluid   Final   Special Requests NONE   Final   Gram Stain     Final   Value: NO WBC SEEN     NO ORGANISMS SEEN     Performed at [facility]   Culture     Final   Value: NO GROWTH 3 DAYS     Performed at [facility]   Report Status PENDING   Incomplete    Coagulation Studies: No results found for this basename: LABPROT, INR,   in the last 72 hours  Imaging: Dg Abd 1 View  07/09/2013   *RADIOLOGY REPORT*  Clinical Data: Band placement  ABDOMEN - 1 VIEW  Comparison: Prior abdominal  radiograph earlier today at 11:49 a.m.  Findings: Frontal views of the abdomen demonstrate a weighted tip enteric feeding tube.  The tip of the tube overlies the right upper quadrant, likely within the first portion of the duodenum.  The bowel gas pattern is not obstructed.  No acute osseous abnormality. No large free air on this supine series.  IMPRESSION: The tip of the enteric feeding tube is likely in the first portion of the duodenum.   Original Report Authenticated By: Malachy Moan, M.D.   Dg Abd 1 View  07/09/2013   CLINICAL DATA:  Post feeding tube placement  EXAM: ABDOMEN - 1 VIEW  COMPARISON:  Portable exam 1149 hr compared to 07/05/2013  FINDINGS: Tip of feeding tube projects over distal stomach.  EKG leads project over chest and abdomen.  Normal bowel gas pattern.  Atelectasis versus infiltrate left lung base.  IMPRESSION: Tip of feeding tube projects over distal stomach.   Electronically Signed   By: Ulyses Southward M.D.   On: 07/09/2013 12:04    Medications:  I have reviewed the patient's current medications. Scheduled: . acyclovir  500 mg Intravenous Q8H  . antiseptic oral rinse  15 mL Mouth Rinse q12n4p  . cefTRIAXone (ROCEPHIN)  IV  2 g Intravenous Q12H  . chlorhexidine  15 mL Mouth Rinse BID  . famotidine (PEPCID) IV  20 mg Intravenous Daily  . feeding supplement (VITAL AF 1.2 CAL)  1,000 mL Per Tube Q24H  . insulin aspart  0-20 Units Subcutaneous Q4H  . insulin detemir  45 Units Subcutaneous BID  . latanoprost  1 drop Both Eyes QHS  . levETIRAcetam  500 mg Intravenous Q12H  . levothyroxine  75 mcg Per NG tube QAC breakfast  . liothyronine  25 mcg Per NG tube BID  . metoprolol  10 mg Intravenous Q6H  . metroNIDAZOLE  500 mg Per Tube Q8H  . sodium chloride  10-40 mL Intracatheter Q12H  . sodium chloride  3 mL  Intravenous Q12H  . vancomycin  750 mg Intravenous Q12H    Assessment/Plan: Patient continues to improve.  Communicating more.  CSF cultures to date have not grown an organism.  HSV remains pending.  No further seizures.  Tolerating Keppra well.    Recommendations: 1.  Would continue Acyclovir until titer returns 2.  Continue Keppra at current dose   LOS: 9 days   Thana Farr, MD [facility] [phone] 07/10/2013  3:14 PM

## 2013-07-10 NOTE — Progress Notes (Signed)
[facility] PROGRESS NOTE  Regina [name] ZOX:[id number] DOB: 1941/07/01 DOA: 07/01/2013 PCP: Michiel Sites, MD  Assessment/Plan: #1 acute encephalopathy Questionable etiology. Clinical improvement. Patient alert, ff commands, answering questions. May be secondary to seizures in the setting of probable bacterial meningitis with patient with nuchal rigidity and a rectal temperature of 102.9 with altered mental status. Per nursing patient with right upper extremity jerking motions 4 nights ago. No further jerking movements. CT of the head was negative. MRI of the head is negative for any acute infarct. EEG is negative for any epileptiform discharges. Patient was hypokalemic and potassium repleted.  Potassium today is 3.0. Keep potassium greater than 4.  Placed on Ativan 1 mg IV every 2 hours when necessary. Neuro checks every 4 hours. S/P lumbar puncture, however had been on antibiotic prior to LP. CSF cultures neg. CSF HSV cultures pending.  Aggrenox and Lovenox which have been discontinued. Repeat blood cultures pending. Replete electrolytes. Continue empirically on IV vancomycin, Rocephin, acyclovir. Continue IV Keppra. Neurology and ID following and appreciate input and recommendations.  #2 probable seizure Patient noted to have jerking motion of the right upper extremity mostly throughout the night  3-4 days ago in addition to altered mental status. Patient's eyes rolled and deviated to the right. Patient also noted to have a fever. CT of the head was negative. MRI of the head was negative. EEG was negative. Significant improvement in her jerking in the right upper extremity. Patient was loaded with IV Keppra and is on Keppra IV every 12 hours. Cultures are pending. Continue empiric IV vancomycin, Rocephin, acyclovir  for possible meningitis. Neuro checks. Neurology following and appreciate input and recommendations.   #3 possible meningitis/CSF infection  Patient noted to have a rectal  temperature 102.9  4 days ago. Fever curve trending down. Patient had significant nuchal rigidity. Patient with altered mental status, but improving daily. Blood cultures are pending. Patient s/p LP .  MRI head negative. EEG negative. Culture from CSF neg. HSV CSF cultures pending, patient however was on empiric IV antibiotics for 2-3 days prior to LP. Continue to hold Aggrenox and Lovenox for now. Mental status  improving as patient ff some commands, answering questions, alert. Continue IV vancomycin, Rocephin, acyclovir to cover empirically for CSF infection. Neurology following and appreciate your input and recommendations. Ampicillin and decadron d/c'd yesterday per ID. ID FF and appreciate input and rxcs.  #4 acute renal failure Likely secondary to volume depletion and hypotension in the setting of ARB. Clinical improvement. Renal function with creatinine now of 0.71. Good urine output with 1700 cc over the past 24 hours. Continue Foley catheter. Bicarbonate drip has been discontinued as acidosis has resolved. Continue to hold ARB and metformin.   #5 hypokalemia/hypomagnesemia Likely secondary to GI losses from C. difficile colitis and increased urinary output. Replete.   #6 C. difficile colitis Change IV Flagyl to per panda. Follow.  #7 hypernatremia Sodium levels improved and now at 136. Change IVF to  NS and follow.   #8 Escherichia coli UTI Patient had received IV Rocephin x3 days. D/C IV Rocephin 4 days ago. Resumed IV Rocephin in light of concern for bacterial meningitis.   #9 hypertension Continue IV Lopressor 10 mg q8.  #10 history of CVA Aggrenox held for lumbar puncture 2 days ago. Patient not fully alert to take orals .Will defer to neurology.  #11 diabetes mellitus Hemoglobin A1c is 9.6. CBGs have ranged from 224-279. Decadron d/c'd. Continue Levemir 45 units subcutaneous twice a  day. Continue sliding scale insulin.  #12 hypothyroidism Continue Synthroid and  Cytomel.  #13 anxiety/depression Stable.  #14 Nuitrition Placed panda tube yesterday. On TF. SLP for eval.  #15 prophylaxis  Pepcid for GI prophylaxis. SCDs for DVT prophylaxis.  Code Status: DNR Family Communication: Updated patient at bedside. Disposition Plan: Remain in SDU.   Consultants:  Nephrology: Dr. Eliott Nine 07/02/2013:  Neurology : Dr Thad Ranger 07/06/13  ID: Dr Luciana Axe 07/09/13  Procedures:  CT head 07/05/2013  KUB 07/05/2013  KUB 07/01/2013  Chest x-ray 07/01/2013  MRI head 07/06/13  EEG 07/06/13  LP 07/07/13  PICC line 07/09/13  Antibiotics:  IV Rocephin 07/01/2013--> 07/05/2013  IV Flagyl 07/04/2013-->07/10/13  Oral flagyl 07/10/13  IV vancomycin 07/06/2013  IV Rocephin 07/06/2013  IV ampicillin 07/06/2013--->07/09/13  IV Acyclovir 07/06/13  HPI/Subjective: Patient is alert, ff commands. Answering questions appropriately. No RUE jerking motions  Objective: Filed Vitals:   07/10/13 0400  BP: 143/59  Pulse: 78  Temp: 98 F (36.7 C)  Resp: 16    Intake/Output Summary (Last 24 hours) at 07/10/13 0839 Last data filed at 07/10/13 0600  Gross per 24 hour  Intake   5812 ml  Output   1400 ml  Net   4412 ml   Filed Weights   07/08/13 0400 07/09/13 0400 07/10/13 0000  Weight: 89.2 kg (196 lb 10.4 oz) 89.2 kg (196 lb 10.4 oz) 94 kg (207 lb 3.7 oz)    Exam:   General:  Eyes open. Ff commands. NACPD  Cardiovascular: RRR  Respiratory: CTAB anterior lung fields.  Abdomen: Soft/NT/ND/+BS  Musculoskeletal: No c/c/e  Neuro: Patient is alert. Answering questions. Following commands. Squeezed my hands and wiggled toes when asked to. Neck rigidity.  Data Reviewed: Basic Metabolic Panel:  Recent Labs Lab 07/05/13 0820 07/06/13 0443  07/07/13 0335 07/07/13 1352 07/08/13 0340 07/08/13 1327 07/09/13 0340 07/10/13 0515  NA 147* 152*  < > 151* 154* 153* 146* 144 136  K 2.7* 2.6*  < > 3.5 3.8 3.8 4.2 3.6 3.0*  CL 92* 96  < > 106 108  109 103 101 97  CO2 27 25  < > 32 35* 33* 32 34* 31  GLUCOSE 328* 474*  < > 412* 407* 365* 300* 261* 202*  BUN 32* 25*  < > 23 23 18 17 14 15   CREATININE 1.65* 1.33*  < > 1.07 1.12* 0.94 0.88 0.82 0.71  CALCIUM 7.8* 7.9*  < > 7.8* 7.8* 7.5* 7.4* 7.5* 7.2*  MG 1.3* 2.3  --   --   --  1.2*  --  1.5 1.5  PHOS  --  2.3  --  1.8*  --  2.8  2.7  --  2.1* 2.3  2.3  < > = values in this interval not displayed. Liver Function Tests:  Recent Labs Lab 07/06/13 0830 07/07/13 0335 07/08/13 0340 07/09/13 0340 07/10/13 0515  AST 14  --   --   --   --   ALT 10  --   --   --   --   ALKPHOS 67  --   --   --   --   BILITOT 0.3  --   --   --   --   PROT 6.4  --   --   --   --   ALBUMIN 3.0* 2.8* 2.7* 2.6* 2.2*   No results found for this basename: LIPASE, AMYLASE,  in the last 168 hours No results found for this basename: AMMONIA,  in the last 168 hours CBC:  Recent Labs Lab 07/06/13 0845 07/07/13 0335 07/08/13 0340 07/09/13 0340 07/10/13 0515  WBC 5.0 4.6 5.0 6.4 7.5  NEUTROABS 2.9 3.2  --   --   --   HGB 13.0 12.3 12.4 12.6 12.0  HCT 38.6 37.0 37.4 38.2 35.2*  MCV 92.8 93.2 94.0 92.9 90.7  PLT 229 164 132* 112* 73*   Cardiac Enzymes: No results found for this basename: CKTOTAL, CKMB, CKMBINDEX, TROPONINI,  in the last 168 hours BNP (last 3 results) No results found for this basename: PROBNP,  in the last 8760 hours CBG:  Recent Labs Lab 07/09/13 1156 07/09/13 1637 07/09/13 2055 07/10/13 0047 07/10/13 0400  GLUCAP 239* 196* 256* 279* 224*    Recent Results (from the past 240 hour(s))  URINE CULTURE     Status: None   Collection Time    07/01/13 12:37 PM      Result Value Range Status   Specimen Description URINE, CATHETERIZED   Final   Special Requests NONE   Final   Culture  Setup Time     Final   Value: 07/01/2013 20:06     Performed at [facility] Count     Final   Value: >=100,000 COLONIES/ML     Performed at [facility]   Culture      Final   Value: ESCHERICHIA COLI     Performed at [facility]   Report Status 07/03/2013 FINAL   Final   Organism ID, Bacteria ESCHERICHIA COLI   Final  CULTURE, BLOOD (ROUTINE X 2)     Status: None   Collection Time    07/05/13 11:20 PM      Result Value Range Status   Specimen Description BLOOD LEFT HAND   Final   Special Requests BOTTLES DRAWN AEROBIC AND ANAEROBIC 3CC   Final   Culture  Setup Time     Final   Value: 07/06/2013 04:42     Performed at [facility]   Culture     Final   Value:        BLOOD CULTURE RECEIVED NO GROWTH TO DATE CULTURE WILL BE HELD FOR 5 DAYS BEFORE ISSUING A FINAL NEGATIVE REPORT     Performed at [facility]   Report Status PENDING   Incomplete  CULTURE, BLOOD (ROUTINE X 2)     Status: None   Collection Time    07/05/13 11:24 PM      Result Value Range Status   Specimen Description BLOOD RIGHT ANTECUBITAL   Final   Special Requests BOTTLES DRAWN AEROBIC AND ANAEROBIC 5CC   Final   Culture  Setup Time     Final   Value: 07/06/2013 04:42     Performed at [facility]   Culture     Final   Value:        BLOOD CULTURE RECEIVED NO GROWTH TO DATE CULTURE WILL BE HELD FOR 5 DAYS BEFORE ISSUING A FINAL NEGATIVE REPORT     Performed at [facility]   Report Status PENDING   Incomplete  URINE CULTURE     Status: None   Collection Time    07/06/13  8:01 AM      Result Value Range Status   Specimen Description URINE, CATHETERIZED   Final   Special Requests NONE   Final   Culture  Setup Time     Final   Value:  07/06/2013 10:49     Performed at [facility] Count     Final   Value: 40,000 COLONIES/ML     Performed at [facility]   Culture     Final   Value: YEAST     Performed at [facility]   Report Status 07/07/2013 FINAL   Final  CULTURE, BLOOD (SINGLE)     Status: None   Collection Time    07/06/13  8:45 AM      Result Value Range Status   Specimen Description BLOOD  RIGHT ARM   Final   Special Requests BOTTLES DRAWN AEROBIC ONLY 3CC    Final   Culture  Setup Time     Final   Value: 07/06/2013 10:49     Performed at [facility]   Culture     Final   Value:        BLOOD CULTURE RECEIVED NO GROWTH TO DATE CULTURE WILL BE HELD FOR 5 DAYS BEFORE ISSUING A FINAL NEGATIVE REPORT     Performed at [facility]   Report Status PENDING   Incomplete  MRSA PCR SCREENING     Status: None   Collection Time    07/06/13  9:25 AM      Result Value Range Status   MRSA by PCR NEGATIVE  NEGATIVE Final   Comment:            The GeneXpert MRSA Assay (FDA     approved for NASAL specimens     only), is one component of a     comprehensive MRSA colonization     surveillance program. It is not     intended to diagnose MRSA     infection nor to guide or     monitor treatment for     MRSA infections.  GRAM STAIN     Status: None   Collection Time    07/07/13  5:40 PM      Result Value Range Status   Specimen Description CSF CSF   Final   Special Requests NONE   Final   Gram Stain     Final   Value: NO ORGANISMS SEEN     NO WBC SEEN     Gram Stain Report Called to,Read Back By and Verified With: AYERS,C. AT 1958 ON 09.26.14 BY LOVE,T.   Report Status 07/07/2013 FINAL   Final  BODY FLUID CULTURE     Status: None   Collection Time    07/07/13  5:42 PM      Result Value Range Status   Specimen Description Body Fluid   Final   Special Requests NONE   Final   Gram Stain     Final   Value: NO WBC SEEN     NO ORGANISMS SEEN     Performed at [facility]   Culture     Final   Value: NO GROWTH 2 DAYS     Performed at [facility]   Report Status PENDING   Incomplete     Studies: Dg Abd 1 View  07/09/2013   *RADIOLOGY REPORT*  Clinical Data: Band placement  ABDOMEN - 1 VIEW  Comparison: Prior abdominal radiograph earlier today at 11:49 a.m.  Findings: Frontal views of the abdomen demonstrate a weighted tip enteric feeding tube.  The  tip of the tube overlies the right upper quadrant, likely within the first portion of the duodenum.  The bowel  gas pattern is not obstructed.  No acute osseous abnormality. No large free air on this supine series.  IMPRESSION: The tip of the enteric feeding tube is likely in the first portion of the duodenum.   Original Report Authenticated By: Malachy Moan, M.D.   Dg Abd 1 View  07/09/2013   CLINICAL DATA:  Post feeding tube placement  EXAM: ABDOMEN - 1 VIEW  COMPARISON:  Portable exam 1149 hr compared to 07/05/2013  FINDINGS: Tip of feeding tube projects over distal stomach.  EKG leads project over chest and abdomen.  Normal bowel gas pattern.  Atelectasis versus infiltrate left lung base.  IMPRESSION: Tip of feeding tube projects over distal stomach.   Electronically Signed   By: Ulyses Southward M.D.   On: 07/09/2013 12:04    Scheduled Meds: . acyclovir  500 mg Intravenous Q8H  . antiseptic oral rinse  15 mL Mouth Rinse q12n4p  . cefTRIAXone (ROCEPHIN)  IV  2 g Intravenous Q12H  . chlorhexidine  15 mL Mouth Rinse BID  . famotidine (PEPCID) IV  20 mg Intravenous Daily  . feeding supplement  30 mL Per Tube q12n4p  . feeding supplement (VITAL AF 1.2 CAL)  1,000 mL Per Tube Q24H  . insulin aspart  0-20 Units Subcutaneous Q4H  . insulin detemir  45 Units Subcutaneous BID  . latanoprost  1 drop Both Eyes QHS  . levETIRAcetam  500 mg Intravenous Q12H  . levothyroxine  37.5 mcg Intravenous Daily  . liothyronine  25 mcg Oral BID  . magnesium sulfate 1 - 4 g bolus IVPB  3 g Intravenous Once  . metoprolol  10 mg Intravenous Q6H  . metronidazole  500 mg Intravenous Q8H  . potassium chloride  10 mEq Intravenous Q1 Hr x 4  . sodium chloride  10-40 mL Intracatheter Q12H  . sodium chloride  3 mL Intravenous Q12H  . vancomycin  750 mg Intravenous Q12H   Continuous Infusions: . sodium chloride 0.9 % 1,000 mL with potassium chloride 40 mEq infusion      Principal Problem:   Acute  encephalopathy Active Problems:   HYPOTHYROIDISM   DIABETES MELLITUS, TYPE II   HYPERLIPIDEMIA   UTI (urinary tract infection)   HTN (hypertension)   Anxiety and depression   CVA (cerebral infarction)   Weakness generalized   Dehydration   Acute renal failure   Failure to thrive   Diarrhea   Hyponatremia   Metabolic acidosis   Clostridium difficile colitis   Hypomagnesemia   Hypernatremia   Seizures   Meningitis, unspecified(322.9)   Protein-calorie malnutrition, severe    Time spent: > 35 mins    [facility]  [facility] Pager [phone]. If 7PM-7AM, please contact night-coverage at [email or web address], password [facility] 07/10/2013, 8:39 AM  LOS: 9 days

## 2013-07-10 NOTE — Progress Notes (Signed)
[id number] Earlene Plater, RN, BSN, CCM [id number] Chart Reviewed for discharge and hospital needs. Discharge needs at time of review:  None Review of patient progress due on [id number].

## 2013-07-10 NOTE — Progress Notes (Signed)
Patient ID: Regina [name], female   DOB: 22-Feb-1941, 72 y.o.   MRN: [id number]         [facility]    Date of Admission:  07/01/2013           Day 8 metronidazole        Day 5 vancomycin, ceftriaxone and acyclovir           Principal Problem:   Acute encephalopathy Active Problems:   HYPOTHYROIDISM   DIABETES MELLITUS, TYPE II   HYPERLIPIDEMIA   UTI (urinary tract infection)   HTN (hypertension)   Anxiety and depression   CVA (cerebral infarction)   Weakness generalized   Dehydration   Acute renal failure   Failure to thrive   Diarrhea   Hyponatremia   Metabolic acidosis   Clostridium difficile colitis   Hypomagnesemia   Hypernatremia   Seizures   Meningitis, unspecified(322.9)   Protein-calorie malnutrition, severe   . acyclovir  500 mg Intravenous Q8H  . antiseptic oral rinse  15 mL Mouth Rinse q12n4p  . cefTRIAXone (ROCEPHIN)  IV  2 g Intravenous Q12H  . chlorhexidine  15 mL Mouth Rinse BID  . famotidine (PEPCID) IV  20 mg Intravenous Daily  . feeding supplement (VITAL AF 1.2 CAL)  1,000 mL Per Tube Q24H  . insulin aspart  0-20 Units Subcutaneous Q4H  . insulin detemir  45 Units Subcutaneous BID  . latanoprost  1 drop Both Eyes QHS  . levETIRAcetam  500 mg Intravenous Q12H  . levothyroxine  75 mcg Per NG tube QAC breakfast  . liothyronine  25 mcg Per NG tube BID  . metoprolol  10 mg Intravenous Q6H  . metroNIDAZOLE  500 mg Per Tube Q8H  . sodium chloride  10-40 mL Intracatheter Q12H  . sodium chloride  3 mL Intravenous Q12H  . vancomycin  750 mg Intravenous Q12H    Subjective: She denies any headache, abdominal pain, nausea or vomiting.  Objective: Temp:  [97.7 F (36.5 C)-99.2 F (37.3 C)] 99.2 F (37.3 C) (09/29 1200) Pulse Rate:  [69-92] 74 (09/29 1400) Resp:  [13-18] 14 (09/29 1400) BP: (123-148)/(44-69) 139/65 mmHg (09/29 1400) SpO2:  [94 %-98 %] 97 % (09/29 1400) Weight:  [94 kg (207 lb 3.7 oz)] 94 kg (207 lb 3.7 oz)  (09/29 0000)  General: She is alert and comfortable Skin: No rash Neck: No pain or obvious stiffness on range of motion Lungs: Clear Cor: Regular S1 and S2 with an early 2/6 systolic murmur Abdomen: Soft and nontender with quiet bowel sounds  Lab Results Lab Results  Component Value Date   WBC 7.5 07/10/2013   HGB 12.0 07/10/2013   HCT 35.2* 07/10/2013   MCV 90.7 07/10/2013   PLT 73* 07/10/2013    Lab Results  Component Value Date   CREATININE 0.71 07/10/2013   BUN 15 07/10/2013   NA 136 07/10/2013   K 3.0* 07/10/2013   CL 97 07/10/2013   CO2 31 07/10/2013     Microbiology: Recent Results (from the past 240 hour(s))  URINE CULTURE     Status: None   Collection Time    07/01/13 12:37 PM      Result Value Range Status   Specimen Description URINE, CATHETERIZED   Final   Special Requests NONE   Final   Culture  Setup Time     Final   Value: 07/01/2013 20:06     Performed at [facility]  Count     Final   Value: >=100,000 COLONIES/ML     Performed at [facility]   Culture     Final   Value: ESCHERICHIA COLI     Performed at [facility]   Report Status 07/03/2013 FINAL   Final   Organism ID, Bacteria ESCHERICHIA COLI   Final  CULTURE, BLOOD (ROUTINE X 2)     Status: None   Collection Time    07/05/13 11:20 PM      Result Value Range Status   Specimen Description BLOOD LEFT HAND   Final   Special Requests BOTTLES DRAWN AEROBIC AND ANAEROBIC 3CC   Final   Culture  Setup Time     Final   Value: 07/06/2013 04:42     Performed at [facility]   Culture     Final   Value:        BLOOD CULTURE RECEIVED NO GROWTH TO DATE CULTURE WILL BE HELD FOR 5 DAYS BEFORE ISSUING A FINAL NEGATIVE REPORT     Performed at [facility]   Report Status PENDING   Incomplete  CULTURE, BLOOD (ROUTINE X 2)     Status: None   Collection Time    07/05/13 11:24 PM      Result Value Range Status   Specimen Description BLOOD RIGHT ANTECUBITAL   Final     Special Requests BOTTLES DRAWN AEROBIC AND ANAEROBIC 5CC   Final   Culture  Setup Time     Final   Value: 07/06/2013 04:42     Performed at [facility]   Culture     Final   Value:        BLOOD CULTURE RECEIVED NO GROWTH TO DATE CULTURE WILL BE HELD FOR 5 DAYS BEFORE ISSUING A FINAL NEGATIVE REPORT     Performed at [facility]   Report Status PENDING   Incomplete  URINE CULTURE     Status: None   Collection Time    07/06/13  8:01 AM      Result Value Range Status   Specimen Description URINE, CATHETERIZED   Final   Special Requests NONE   Final   Culture  Setup Time     Final   Value: 07/06/2013 10:49     Performed at [facility] Count     Final   Value: 40,000 COLONIES/ML     Performed at [facility]   Culture     Final   Value: YEAST     Performed at [facility]   Report Status 07/07/2013 FINAL   Final  CULTURE, BLOOD (SINGLE)     Status: None   Collection Time    07/06/13  8:45 AM      Result Value Range Status   Specimen Description BLOOD RIGHT ARM   Final   Special Requests BOTTLES DRAWN AEROBIC ONLY 3CC    Final   Culture  Setup Time     Final   Value: 07/06/2013 10:49     Performed at [facility]   Culture     Final   Value:        BLOOD CULTURE RECEIVED NO GROWTH TO DATE CULTURE WILL BE HELD FOR 5 DAYS BEFORE ISSUING A FINAL NEGATIVE REPORT     Performed at [facility]   Report Status PENDING   Incomplete  MRSA PCR SCREENING     Status: None  Collection Time    07/06/13  9:25 AM      Result Value Range Status   MRSA by PCR NEGATIVE  NEGATIVE Final   Comment:            The GeneXpert MRSA Assay (FDA     approved for NASAL specimens     only), is one component of a     comprehensive MRSA colonization     surveillance program. It is not     intended to diagnose MRSA     infection nor to guide or     monitor treatment for     MRSA infections.  GRAM STAIN     Status: None   Collection  Time    07/07/13  5:40 PM      Result Value Range Status   Specimen Description CSF CSF   Final   Special Requests NONE   Final   Gram Stain     Final   Value: NO ORGANISMS SEEN     NO WBC SEEN     Gram Stain Report Called to,Read Back By and Verified With: AYERS,C. AT 1958 ON 09.26.14 BY LOVE,T.   Report Status 07/07/2013 FINAL   Final  BODY FLUID CULTURE     Status: None   Collection Time    07/07/13  5:42 PM      Result Value Range Status   Specimen Description Body Fluid   Final   Special Requests NONE   Final   Gram Stain     Final   Value: NO WBC SEEN     NO ORGANISMS SEEN     Performed at [facility]   Culture     Final   Value: NO GROWTH 3 DAYS     Performed at [facility]   Report Status PENDING   Incomplete     Assessment: The results of her lumbar puncture are not suggestive of spinal meningitis but she is improving on empiric therapy for possible spinal meningitis and a C. difficile colitis. . Vancomycin, ceftriaxone and acyclovir within the next 48 hours and continue metronidazole.  Plan: 1. Continue metronidazole 2. Stop vancomycin, ceftriaxone and acyclovir within the next 48 hours  Cliffton Asters, MD [facility] [phone] pager   [phone] cell 07/10/2013, 4:10 PM

## 2013-07-11 DIAGNOSIS — D696 Thrombocytopenia, unspecified: Secondary | ICD-10-CM

## 2013-07-11 LAB — RENAL FUNCTION PANEL
BUN: 14 mg/dL (ref 6–23)
CO2: 27 mEq/L (ref 19–32)
Chloride: 105 mEq/L (ref 96–112)
Creatinine, Ser: 0.68 mg/dL (ref 0.50–1.10)
GFR calc Af Amer: >90 mL/min (ref >90)
Glucose, Bld: 106 mg/dL — ABNORMAL HIGH (ref 70–99)
Phosphorus: 3.6 mg/dL (ref 2.3–4.6)
Potassium: 4.1 mEq/L (ref 3.5–5.1)
Sodium: 139 mEq/L (ref 135–145)

## 2013-07-11 LAB — GLUCOSE, CAPILLARY: Glucose-Capillary: 141 mg/dL — ABNORMAL HIGH (ref 70–99)

## 2013-07-11 LAB — DIFFERENTIAL
Basophils Absolute: 0.1 10*3/uL (ref 0.0–0.1)
Basophils Relative: 1 % (ref 0–1)
Eosinophils Absolute: 0 10*3/uL (ref 0.0–0.7)
Monocytes Absolute: 0.7 10*3/uL (ref 0.1–1.0)
Neutro Abs: 3.5 10*3/uL (ref 1.7–7.7)
Neutrophils Relative %: 60 % (ref 43–77)

## 2013-07-11 LAB — BODY FLUID CULTURE: Culture: NO GROWTH

## 2013-07-11 LAB — CBC
HCT: 33.6 % — ABNORMAL LOW (ref 36.0–46.0)
Hemoglobin: 11.2 g/dL — ABNORMAL LOW (ref 12.0–15.0)
MCH: 30.8 pg (ref 26.0–34.0)
MCHC: 33.3 g/dL (ref 30.0–36.0)
MCV: 92.3 fL (ref 78.0–100.0)
Platelets: 62 K/uL — ABNORMAL LOW (ref 150–400)
RBC: 3.64 MIL/uL — ABNORMAL LOW (ref 3.87–5.11)
RDW: 14.3 % (ref 11.5–15.5)
WBC: 5.7 K/uL (ref 4.0–10.5)

## 2013-07-11 LAB — HAPTOGLOBIN: Haptoglobin: 28 mg/dL — ABNORMAL LOW (ref 45–215)

## 2013-07-11 LAB — LACTATE DEHYDROGENASE: LDH: 366 U/L — ABNORMAL HIGH (ref 94–250)

## 2013-07-11 MED ORDER — JEVITY 1.2 CAL PO LIQD
1000.0000 mL | ORAL | Status: DC
  Administered 2013-07-11 – 2013-07-16 (×7): 1000 mL
  Filled 2013-07-11 (×4): qty 1000
  Filled 2013-07-11: qty 237
  Filled 2013-07-11 (×13): qty 1000

## 2013-07-11 MED ORDER — VANCOMYCIN 50 MG/ML ORAL SOLUTION
125.0000 mg | Freq: Four times a day (QID) | ORAL | Status: DC
  Administered 2013-07-11 – 2013-07-22 (×41): 125 mg
  Filled 2013-07-11 (×52): qty 2.5

## 2013-07-11 MED ORDER — METOPROLOL TARTRATE 1 MG/ML IV SOLN
INTRAVENOUS | Status: AC
  Filled 2013-07-11: qty 10

## 2013-07-11 MED ORDER — MAGNESIUM SULFATE 50 % IJ SOLN
3.0000 g | Freq: Once | INTRAVENOUS | Status: AC
  Administered 2013-07-11: 3 g via INTRAVENOUS
  Filled 2013-07-11: qty 6

## 2013-07-11 MED ORDER — AMLODIPINE BESYLATE 10 MG PO TABS
10.0000 mg | ORAL_TABLET | Freq: Every day | ORAL | Status: DC
  Administered 2013-07-12 – 2013-07-22 (×11): 10 mg via NASOGASTRIC
  Filled 2013-07-11 (×11): qty 1

## 2013-07-11 MED ORDER — CARVEDILOL 12.5 MG PO TABS
12.5000 mg | ORAL_TABLET | Freq: Two times a day (BID) | ORAL | Status: DC
  Administered 2013-07-11 – 2013-07-22 (×23): 12.5 mg via NASOGASTRIC
  Filled 2013-07-11 (×27): qty 1

## 2013-07-11 MED ORDER — SODIUM CHLORIDE 0.9 % IV SOLN
INTRAVENOUS | Status: DC
  Administered 2013-07-11 – 2013-07-16 (×10): via INTRAVENOUS
  Filled 2013-07-11 (×21): qty 1000

## 2013-07-11 MED ORDER — AMLODIPINE BESYLATE 5 MG PO TABS
5.0000 mg | ORAL_TABLET | Freq: Every day | ORAL | Status: DC
  Administered 2013-07-11: 5 mg via NASOGASTRIC
  Filled 2013-07-11: qty 1

## 2013-07-11 MED ORDER — SACCHAROMYCES BOULARDII 250 MG PO CAPS
250.0000 mg | ORAL_CAPSULE | Freq: Two times a day (BID) | ORAL | Status: DC
  Administered 2013-07-11 – 2013-07-22 (×23): 250 mg via ORAL
  Filled 2013-07-11 (×25): qty 1

## 2013-07-11 NOTE — Evaluation (Signed)
Clinical/Bedside Swallow Evaluation Patient Details  Name: Regina [name] MRN: [id number] Date of Birth: [birth year]  Today's Date: 07/11/2013 Time: 0820-0858 SLP Time Calculation (min): 38 min  Past Medical History:  Past Medical History  Diagnosis Date  . Hypertension   . Hypercholesteremia   . TIA (transient ischemic attack) 1990's    "mini strokes" (06/07/2013)  . Legally blind     "both eyes; some vision in the right' (06/07/2013)  . Heart murmur   . Hypothyroidism   . Type II diabetes mellitus   . Anemia   . GERD (gastroesophageal reflux disease)   . Arthritis     "left leg" (06/07/2013)  . Depression   . Vulva cancer   . Altered mental status     "the first time I noticed any problem was 2 d ago" (06/07/2013)   Past Surgical History:  Past Surgical History  Procedure Laterality Date  . Femur fracture surgery Left 1999  . Cesarean section  1968  . Tubal ligation  1970's  . Vulva surgery  1990's    "cut a big chunk out for cancer" (06/07/2013)   HPI:  72 yo female adm to [facility] with AMS, found to have meningitis, seizures.  PMH + for CVA, FTT, DM2.  Pt has Panda for nutrition (placed Sunday) and has been more alert in this last few days.  Today pt is verbalizing but is disoriented with delays responses and inconsistent ability to follow directions.  Bedside swallow evaluation was ordered.  MRI negative 07/06/13.  CXR 07/06/13 Poor inspiration with mild basilar atelectasis. No segmental infiltrate or pulmonary edema. Central mild vascular congestion.   Assessment / Plan / Recommendation Clinical Impression  Pt currently with severe cognitive based and suspected motor *weakness* based dysphagia with airway compromise during minimal trials provided.  Pt with delayed responses to SLP and followed commands only approximately 50% of the time - including cues to dry swallow or hock to clear sensation of stasis.  Single ice chips and 1/2 tsp applesauce given with delayed swallow followed by  immediate throat clearing and pt report of pharyngeal stasis.    Pt did not consistently follow directions to "hock" or dry swallow to facilitate clearance, which increases her asp risk significantly. Pt further reported (via direct y/n questioning) that she had problems swallowing foods resulting in frequent coughing during intake PTA - she did not inform her physician of these findings.  She also states she required heimlich manuever x1.  Uncertain of ability for pt to provide hx, but appeared appropriate.    Rec NPO except single ice chips given by RN only after oral care that pt allows to melt fully- monitoring tolerance closely.  Anticipate pt will demonstrate improved swallow function as mental/medical status continues to improve.  SLP to follow for readiness for instrumental swallow evaluation/po intake.      Aspiration Risk  Severe    Diet Recommendation NPO;Ice chips PRN after oral care (by RN only)   Medication Administration: Via alternative means    Other  Recommendations Recommended Consults: MBS (MBS when pt able to participate (if pt size does not limit))   Follow Up Recommendations       Frequency and Duration min 2x/week  2 weeks   Pertinent Vitals/Pain Afebrile, decreased    SLP Swallow Goals Goal #3: Pt will swallow melted ice chips without clinical s/s of aspiration to aid in determining readiness for po intake/instrumental swallow evaluation.   Goal #4: Pt will follow commands to  dry swallow after initial ice chip swallow within 8 seconds to aid in determining readiness for po intake/instrumental swallow evaluation.    Swallow Study Prior Functional Status   see clinical impressions    General Date of Onset: 07/11/13 HPI: 72 yo female adm to [facility] with AMS, found to have meningitis, seizures.  PMH + for CVA, FTT, DM2.  Pt has Panda for nutrition (placed Sunday) and has been more alert in this last few days.  Today pt is verbalizing but is disoriented with delays  responses and inconsistent ability to follow directions.  Bedside swallow evaluation was ordered.  MRI negative 07/06/13.  CXR 07/06/13 Poor inspiration with mild basilar atelectasis. No segmental infiltrate or pulmonary edema. Central mild vascular congestion. Type of Study: Bedside swallow evaluation Diet Prior to this Study: NPO Temperature Spikes Noted: No Respiratory Status: Supplemental O2 delivered via (comment) History of Recent Intubation: No Behavior/Cognition: Distractible;Doesn't follow directions;Requires cueing;Decreased sustained attention;Hard of hearing;Confused;Cooperative Oral Cavity - Dentition: Dentures, top (lower few missing dentition) Self-Feeding Abilities: Total assist Patient Positioning: Upright in bed Baseline Vocal Quality: Low vocal intensity Volitional Cough: Weak Volitional Swallow: Unable to elicit    Oral/Motor/Sensory Function Overall Oral Motor/Sensory Function: Impaired (generalized weakness, pt did not follow commands for eval) Velum:  (sluggish!)   Ice Chips Ice chips: Impaired Oral Phase Impairments: Reduced lingual movement/coordination;Impaired anterior to posterior transit Oral Phase Functional Implications: Prolonged oral transit Pharyngeal Phase Impairments: Suspected delayed Swallow;Decreased hyoid-laryngeal movement;Throat Clearing - Immediate;Throat Clearing - Delayed   Thin Liquid Thin Liquid: Not tested    Nectar Thick Nectar Thick Liquid: Not tested   Honey Thick Honey Thick Liquid: Not tested   Puree Puree: Impaired Presentation: Self Fed;Spoon Oral Phase Impairments: Reduced lingual movement/coordination;Impaired anterior to posterior transit Oral Phase Functional Implications: Prolonged oral transit Pharyngeal Phase Impairments: Suspected delayed Swallow;Decreased hyoid-laryngeal movement;Throat Clearing - Immediate Other Comments: pt did not conduct dry swallow per SLP directions to aid clearance nor was she able to hock to clear  although she did report sensation of stasis   Solid   GO    Solid: Not tested       Donavan Burnet, MS [facility] SLP [phone]

## 2013-07-11 NOTE — Progress Notes (Signed)
Patient ID: Regina [name], female   DOB: 07-27-1941, 72 y.o.   MRN: [id number]         [facility]    Date of Admission:  07/01/2013           Day 9 metronidazole        Day 6 vancomycin, ceftriaxone and acyclovir   Principal Problem:   Acute encephalopathy Active Problems:   HYPOTHYROIDISM   DIABETES MELLITUS, TYPE II   HYPERLIPIDEMIA   UTI (urinary tract infection)   HTN (hypertension)   Anxiety and depression   CVA (cerebral infarction)   Weakness generalized   Dehydration   Acute renal failure   Failure to thrive   Diarrhea   Hyponatremia   Metabolic acidosis   Clostridium difficile colitis   Hypomagnesemia   Hypernatremia   Seizures   Meningitis, unspecified(322.9)   Protein-calorie malnutrition, severe   Thrombocytopenia, unspecified   . acyclovir  500 mg Intravenous Q8H  . [START ON 07/12/2013] amLODipine  10 mg Per NG tube Daily  . antiseptic oral rinse  15 mL Mouth Rinse q12n4p  . carvedilol  12.5 mg Per NG tube BID WC  . cefTRIAXone (ROCEPHIN)  IV  2 g Intravenous Q12H  . chlorhexidine  15 mL Mouth Rinse BID  . famotidine (PEPCID) IV  20 mg Intravenous Daily  . insulin aspart  0-20 Units Subcutaneous Q4H  . insulin detemir  45 Units Subcutaneous BID  . latanoprost  1 drop Both Eyes QHS  . levETIRAcetam  500 mg Intravenous Q12H  . levothyroxine  75 mcg Per NG tube QAC breakfast  . liothyronine  25 mcg Per NG tube BID  . metroNIDAZOLE  500 mg Per Tube Q8H  . saccharomyces boulardii  250 mg Oral BID  . sodium chloride  10-40 mL Intracatheter Q12H  . sodium chloride  3 mL Intravenous Q12H  . vancomycin  750 mg Intravenous Q12H    Subjective: She denies any headache.   Objective: Temp:  [97.4 F (36.3 C)-98.8 F (37.1 C)] 98.8 F (37.1 C) (09/30 1200) Pulse Rate:  [71-93] 92 (09/30 1400) Resp:  [11-19] 16 (09/30 1400) BP: (126-163)/(53-90) 156/73 mmHg (09/30 1400) SpO2:  [96 %-100 %] 100 % (09/30 1400) Weight:  [96.4 kg  (212 lb 8.4 oz)] 96.4 kg (212 lb 8.4 oz) (09/30 0500)  General: She is alert and comfortable  Skin: No rash  Neck: No pain or obvious stiffness on range of motion  Lungs: Clear  Cor: Regular S1 and S2 with an early 2/6 systolic murmur  Abdomen: Soft and nontender with quiet bowel sounds Affect: Flat  Lab Results Lab Results  Component Value Date   WBC 5.7 07/11/2013   HGB 11.2* 07/11/2013   HCT 33.6* 07/11/2013   MCV 92.3 07/11/2013   PLT 62* 07/11/2013    Lab Results  Component Value Date   CREATININE 0.68 07/11/2013   BUN 14 07/11/2013   NA 139 07/11/2013   K 4.1 07/11/2013   CL 105 07/11/2013   CO2 27 07/11/2013    Lab Results  Component Value Date   ALT 10 07/06/2013   AST 14 07/06/2013   ALKPHOS 67 07/06/2013   BILITOT 0.3 07/06/2013      Microbiology: Recent Results (from the past 240 hour(s))  CULTURE, BLOOD (ROUTINE X 2)     Status: None   Collection Time    07/05/13 11:20 PM      Result Value Range Status  Specimen Description BLOOD LEFT HAND   Final   Special Requests BOTTLES DRAWN AEROBIC AND ANAEROBIC 3CC   Final   Culture  Setup Time     Final   Value: 07/06/2013 04:42     Performed at [facility]   Culture     Final   Value:        BLOOD CULTURE RECEIVED NO GROWTH TO DATE CULTURE WILL BE HELD FOR 5 DAYS BEFORE ISSUING A FINAL NEGATIVE REPORT     Performed at [facility]   Report Status PENDING   Incomplete  CULTURE, BLOOD (ROUTINE X 2)     Status: None   Collection Time    07/05/13 11:24 PM      Result Value Range Status   Specimen Description BLOOD RIGHT ANTECUBITAL   Final   Special Requests BOTTLES DRAWN AEROBIC AND ANAEROBIC 5CC   Final   Culture  Setup Time     Final   Value: 07/06/2013 04:42     Performed at [facility]   Culture     Final   Value:        BLOOD CULTURE RECEIVED NO GROWTH TO DATE CULTURE WILL BE HELD FOR 5 DAYS BEFORE ISSUING A FINAL NEGATIVE REPORT     Performed at [facility]   Report Status  PENDING   Incomplete  URINE CULTURE     Status: None   Collection Time    07/06/13  8:01 AM      Result Value Range Status   Specimen Description URINE, CATHETERIZED   Final   Special Requests NONE   Final   Culture  Setup Time     Final   Value: 07/06/2013 10:49     Performed at [facility] Count     Final   Value: 40,000 COLONIES/ML     Performed at [facility]   Culture     Final   Value: YEAST     Performed at [facility]   Report Status 07/07/2013 FINAL   Final  CULTURE, BLOOD (SINGLE)     Status: None   Collection Time    07/06/13  8:45 AM      Result Value Range Status   Specimen Description BLOOD RIGHT ARM   Final   Special Requests BOTTLES DRAWN AEROBIC ONLY 3CC    Final   Culture  Setup Time     Final   Value: 07/06/2013 10:49     Performed at [facility]   Culture     Final   Value:        BLOOD CULTURE RECEIVED NO GROWTH TO DATE CULTURE WILL BE HELD FOR 5 DAYS BEFORE ISSUING A FINAL NEGATIVE REPORT     Performed at [facility]   Report Status PENDING   Incomplete  MRSA PCR SCREENING     Status: None   Collection Time    07/06/13  9:25 AM      Result Value Range Status   MRSA by PCR NEGATIVE  NEGATIVE Final   Comment:            The GeneXpert MRSA Assay (FDA     approved for NASAL specimens     only), is one component of a     comprehensive MRSA colonization     surveillance program. It is not     intended to diagnose MRSA     infection nor to  guide or     monitor treatment for     MRSA infections.  GRAM STAIN     Status: None   Collection Time    07/07/13  5:40 PM      Result Value Range Status   Specimen Description CSF CSF   Final   Special Requests NONE   Final   Gram Stain     Final   Value: NO ORGANISMS SEEN     NO WBC SEEN     Gram Stain Report Called to,Read Back By and Verified With: AYERS,C. AT 1958 ON 09.26.14 BY LOVE,T.   Report Status 07/07/2013 FINAL   Final  BODY FLUID CULTURE      Status: None   Collection Time    07/07/13  5:42 PM      Result Value Range Status   Specimen Description Body Fluid   Final   Special Requests NONE   Final   Gram Stain     Final   Value: NO WBC SEEN     NO ORGANISMS SEEN     Performed at [facility]   Culture     Final   Value: NO GROWTH 3 DAYS     Performed at [facility]   Report Status 07/11/2013 FINAL   Final   Assessment: Her mental status has been improving. I am still not convinced that she had meningitis. I will stop her vancomycin, ceftriaxone and acyclovir tomorrow. This will hopefully help her C. difficile diarrhea which seems to be worse. I will change her oral metronidazole to oral vancomycin now.  Plan: 1. Change metronidazole to oral vancomycin 2. Discontinue IV vancomycin, ceftriaxone and acyclovir tomorrow  Cliffton Asters, MD [facility] [phone] pager   [phone] cell 07/11/2013, 3:23 PM

## 2013-07-11 NOTE — Progress Notes (Signed)
Subjective: Patient working well with nursing  Follows commands.  No seizure activity noted.    Objective: Current vital signs: BP 156/73  Pulse 92  Temp(Src) 98.8 F (37.1 C) (Oral)  Resp 16  Ht 5\' 2"  (1.575 m)  Wt 96.4 kg (212 lb 8.4 oz)  BMI 38.86 kg/m2  SpO2 100% Vital signs in last 24 hours: Temp:  [97.4 F (36.3 C)-98.8 F (37.1 C)] 98.8 F (37.1 C) (09/30 1200) Pulse Rate:  [71-93] 92 (09/30 1400) Resp:  [11-19] 16 (09/30 1400) BP: (126-163)/(53-90) 156/73 mmHg (09/30 1400) SpO2:  [96 %-100 %] 100 % (09/30 1400) Weight:  [96.4 kg (212 lb 8.4 oz)] 96.4 kg (212 lb 8.4 oz) (09/30 0500)  Intake/Output from previous day: 09/29 0701 - 09/30 0700 In: 5411.2 [I.V.:2250; NG/GT:1310.2; IV Piggyback:1271] Out: 2490 [Urine:2400; Emesis/NG output:90] Intake/Output this shift: Total I/O In: 1785 [I.V.:920; NG/GT:405; IV Piggyback:460] Out: 1052 [Urine:1050; Emesis/NG output:2] Nutritional status: NPO  Neurologic Exam: Mental Status:  Eyes open. Speech is fluent. Follows simple commands. Is able to keep her eyes open.  Cranial Nerves:  II: Focus on examiner on either side of the bed, pupils right 3 mm, left 3 mm,and reactive bilaterally.  III,IV,VI: Control and instrumentation engineer around the room  V,VII: corneal reflex intact bilaterally,  VIII: patient does not respond to verbal stimuli  IX,X: gag reflex reduced, XI: trapezius strength unable to test bilaterally but patient does move head from side-to-side spontaneously  XII: midline tongue extension  Motor:  Moves all extremities to command and antigravity throughout  Sensory:  Respond to noxious stimuli in all extremities.  Deep Tendon Reflexes:  2+ in the upper extremities and 1+ in the lower extremities  Plantars:  downgoing on the right and mute on the left    Lab Results: Basic Metabolic Panel:  Recent Labs Lab 07/06/13 0443  07/07/13 0335  07/08/13 0340 07/08/13 1327 07/09/13 0340 07/10/13 0515 07/11/13 0630  NA  152*  < > 151*  < > 153* 146* 144 136 139  K 2.6*  < > 3.5  < > 3.8 4.2 3.6 3.0* 4.1  CL 96  < > 106  < > 109 103 101 97 105  CO2 25  < > 32  < > 33* 32 34* 31 27  GLUCOSE 474*  < > 412*  < > 365* 300* 261* 202* 106*  BUN 25*  < > 23  < > 18 17 14 15 14   CREATININE 1.33*  < > 1.07  < > 0.94 0.88 0.82 0.71 0.68  CALCIUM 7.9*  < > 7.8*  < > 7.5* 7.4* 7.5* 7.2* 7.8*  MG 2.3  --   --   --  1.2*  --  1.5 1.5 1.6  PHOS 2.3  --  1.8*  --  2.8  2.7  --  2.1* 2.3  2.3 3.6  < > = values in this interval not displayed.  Liver Function Tests:  Recent Labs Lab 07/06/13 0830 07/07/13 0335 07/08/13 0340 07/09/13 0340 07/10/13 0515 07/11/13 0630  AST 14  --   --   --   --   --   ALT 10  --   --   --   --   --   ALKPHOS 67  --   --   --   --   --   BILITOT 0.3  --   --   --   --   --   PROT 6.4  --   --   --   --   --  ALBUMIN 3.0* 2.8* 2.7* 2.6* 2.2* 2.2*   No results found for this basename: LIPASE, AMYLASE,  in the last 168 hours No results found for this basename: AMMONIA,  in the last 168 hours  CBC:  Recent Labs Lab 07/06/13 0845 07/07/13 0335 07/08/13 0340 07/09/13 0340 07/10/13 0515 07/11/13 0630  WBC 5.0 4.6 5.0 6.4 7.5 5.7  NEUTROABS 2.9 3.2  --   --   --  3.5  HGB 13.0 12.3 12.4 12.6 12.0 11.2*  HCT 38.6 37.0 37.4 38.2 35.2* 33.6*  MCV 92.8 93.2 94.0 92.9 90.7 92.3  PLT 229 164 132* 112* 73* 62*    Cardiac Enzymes: No results found for this basename: CKTOTAL, CKMB, CKMBINDEX, TROPONINI,  in the last 168 hours  Lipid Panel: No results found for this basename: CHOL, TRIG, HDL, CHOLHDL, VLDL, LDLCALC,  in the last 168 hours  CBG:  Recent Labs Lab 07/10/13 1918 07/11/13 0059 07/11/13 0354 07/11/13 0848 07/11/13 1205  GLUCAP 161* 141* 114* 111* 170*    Microbiology: Results for orders placed during the hospital encounter of 07/01/13  URINE CULTURE     Status: None   Collection Time    07/01/13 12:37 PM      Result Value Range Status   Specimen  Description URINE, CATHETERIZED   Final   Special Requests NONE   Final   Culture  Setup Time     Final   Value: 07/01/2013 20:06     Performed at [facility] Count     Final   Value: >=100,000 COLONIES/ML     Performed at [facility]   Culture     Final   Value: ESCHERICHIA COLI     Performed at [facility]   Report Status 07/03/2013 FINAL   Final   Organism ID, Bacteria ESCHERICHIA COLI   Final  CULTURE, BLOOD (ROUTINE X 2)     Status: None   Collection Time    07/05/13 11:20 PM      Result Value Range Status   Specimen Description BLOOD LEFT HAND   Final   Special Requests BOTTLES DRAWN AEROBIC AND ANAEROBIC 3CC   Final   Culture  Setup Time     Final   Value: 07/06/2013 04:42     Performed at [facility]   Culture     Final   Value:        BLOOD CULTURE RECEIVED NO GROWTH TO DATE CULTURE WILL BE HELD FOR 5 DAYS BEFORE ISSUING A FINAL NEGATIVE REPORT     Performed at [facility]   Report Status PENDING   Incomplete  CULTURE, BLOOD (ROUTINE X 2)     Status: None   Collection Time    07/05/13 11:24 PM      Result Value Range Status   Specimen Description BLOOD RIGHT ANTECUBITAL   Final   Special Requests BOTTLES DRAWN AEROBIC AND ANAEROBIC 5CC   Final   Culture  Setup Time     Final   Value: 07/06/2013 04:42     Performed at [facility]   Culture     Final   Value:        BLOOD CULTURE RECEIVED NO GROWTH TO DATE CULTURE WILL BE HELD FOR 5 DAYS BEFORE ISSUING A FINAL NEGATIVE REPORT     Performed at [facility]   Report Status PENDING   Incomplete  URINE CULTURE     Status: None  Collection Time    07/06/13  8:01 AM      Result Value Range Status   Specimen Description URINE, CATHETERIZED   Final   Special Requests NONE   Final   Culture  Setup Time     Final   Value: 07/06/2013 10:49     Performed at [facility] Count     Final   Value: 40,000 COLONIES/ML     Performed at  [facility]   Culture     Final   Value: YEAST     Performed at [facility]   Report Status 07/07/2013 FINAL   Final  CULTURE, BLOOD (SINGLE)     Status: None   Collection Time    07/06/13  8:45 AM      Result Value Range Status   Specimen Description BLOOD RIGHT ARM   Final   Special Requests BOTTLES DRAWN AEROBIC ONLY 3CC    Final   Culture  Setup Time     Final   Value: 07/06/2013 10:49     Performed at [facility]   Culture     Final   Value:        BLOOD CULTURE RECEIVED NO GROWTH TO DATE CULTURE WILL BE HELD FOR 5 DAYS BEFORE ISSUING A FINAL NEGATIVE REPORT     Performed at [facility]   Report Status PENDING   Incomplete  MRSA PCR SCREENING     Status: None   Collection Time    07/06/13  9:25 AM      Result Value Range Status   MRSA by PCR NEGATIVE  NEGATIVE Final   Comment:            The GeneXpert MRSA Assay (FDA     approved for NASAL specimens     only), is one component of a     comprehensive MRSA colonization     surveillance program. It is not     intended to diagnose MRSA     infection nor to guide or     monitor treatment for     MRSA infections.  GRAM STAIN     Status: None   Collection Time    07/07/13  5:40 PM      Result Value Range Status   Specimen Description CSF CSF   Final   Special Requests NONE   Final   Gram Stain     Final   Value: NO ORGANISMS SEEN     NO WBC SEEN     Gram Stain Report Called to,Read Back By and Verified With: AYERS,C. AT 1958 ON 09.26.14 BY LOVE,T.   Report Status 07/07/2013 FINAL   Final  BODY FLUID CULTURE     Status: None   Collection Time    07/07/13  5:42 PM      Result Value Range Status   Specimen Description Body Fluid   Final   Special Requests NONE   Final   Gram Stain     Final   Value: NO WBC SEEN     NO ORGANISMS SEEN     Performed at [facility]   Culture     Final   Value: NO GROWTH 3 DAYS     Performed at [facility]   Report Status 07/11/2013  FINAL   Final    Coagulation Studies: No results found for this basename: LABPROT, INR,  in the last 72 hours  Imaging:  No results found.  Medications:  I have reviewed the patient's current medications. Scheduled: . acyclovir  500 mg Intravenous Q8H  . [START ON 07/12/2013] amLODipine  10 mg Per NG tube Daily  . antiseptic oral rinse  15 mL Mouth Rinse q12n4p  . carvedilol  12.5 mg Per NG tube BID WC  . cefTRIAXone (ROCEPHIN)  IV  2 g Intravenous Q12H  . chlorhexidine  15 mL Mouth Rinse BID  . famotidine (PEPCID) IV  20 mg Intravenous Daily  . insulin aspart  0-20 Units Subcutaneous Q4H  . insulin detemir  45 Units Subcutaneous BID  . latanoprost  1 drop Both Eyes QHS  . levETIRAcetam  500 mg Intravenous Q12H  . levothyroxine  75 mcg Per NG tube QAC breakfast  . liothyronine  25 mcg Per NG tube BID  . metroNIDAZOLE  500 mg Per Tube Q8H  . saccharomyces boulardii  250 mg Oral BID  . sodium chloride  10-40 mL Intracatheter Q12H  . sodium chloride  3 mL Intravenous Q12H  . vancomycin  750 mg Intravenous Q12H    Assessment/Plan: Patient continues to improve.  ID following antibiotics.  Seizures controlled on Keppra.    Recommendations: 1.  Continue Keppra at current dose 2.  No further neurologic intervention is recommended at this time.  If further questions arise, please call or page at that time.  Thank you for allowing neurology to participate in the care of this patient.    LOS: 10 days   Thana Farr, MD [facility] [phone] 07/11/2013  3:18 PM

## 2013-07-11 NOTE — Progress Notes (Signed)
[facility] PROGRESS NOTE  Regina [name] JXB:[id number] DOB: 1940-11-16 DOA: 07/01/2013 PCP: Michiel Sites, MD  Assessment/Plan: #1 acute encephalopathy Questionable etiology. Clinical improvement. Patient alert, ff commands, answering questions. May be secondary to seizures in the setting of probable bacterial meningitis with patient with nuchal rigidity and a rectal temperature of 102.9 with altered mental status. Per nursing patient with right upper extremity jerking motions 5 nights ago. No further jerking movements. CT of the head was negative. MRI of the head is negative for any acute infarct. EEG is negative for any epileptiform discharges. Patient was hypokalemic and potassium repleted.  Potassium today is 4.1. Keep potassium greater than 4.  Placed on Ativan 1 mg IV every 2 hours when necessary. Neuro checks every 4 hours. S/P lumbar puncture, however had been on antibiotic prior to LP. CSF cultures neg. CSF HSV cultures pending.  Aggrenox and Lovenox which have been discontinued. Repeat blood cultures pending. Replete electrolytes. Continue empirically on IV vancomycin, Rocephin, acyclovir. Continue IV Keppra. Neurology and ID following and appreciate input and recommendations.  #2 probable seizure Patient noted to have jerking motion of the right upper extremity mostly throughout the night  5 days ago in addition to altered mental status. Patient's eyes rolled and deviated to the right. Patient also noted to have a fever. CT of the head was negative. MRI of the head was negative. EEG was negative. Significant improvement in her jerking in the right upper extremity. Patient was loaded with IV Keppra and is on Keppra IV every 12 hours. Cultures are pending. Continue empiric IV vancomycin, Rocephin, acyclovir  for possible meningitis. Neuro checks. Neurology following and appreciate input and recommendations.   #3 possible meningitis/CSF infection  Patient noted to have a rectal  temperature 102.9  5 days ago. Fever curve trending down. Patient had significant nuchal rigidity. Patient with altered mental status, but improving daily. Blood cultures are pending. Patient s/p LP .  MRI head negative. EEG negative. Culture from CSF neg. HSV CSF cultures pending, patient however was on empiric IV antibiotics for 2-3 days prior to LP. Continue to hold Aggrenox and Lovenox for now as platelets at 62. Mental status  improving as patient ff some commands, answering questions, alert. Continue IV vancomycin, Rocephin, acyclovir to cover empirically for CSF infection. Neurology following and appreciate your input and recommendations. Ampicillin and decadron d/c'd per ID. ID and neuro FF and appreciate input and rxcs. Per ID stop vanc, rocephin, acyclovir in next 24-48 hours.  #4 acute renal failure Likely secondary to volume depletion and hypotension in the setting of ARB. Clinical improvement. Renal function with creatinine now of 0.68. Good urine output with 2400 cc over the past 24 hours. Continue Foley catheter. Bicarbonate drip has been discontinued as acidosis has resolved. Continue to hold ARB and metformin.   #5 hypokalemia/hypomagnesemia Likely secondary to GI losses from C. difficile colitis and increased urinary output. Replete.Keep magnesium at 2.   #6 C. difficile colitis Continue oral Flagyl to per panda. Follow.  #7 hypernatremia Sodium levels improved and now at 139. Decrease IVF. follow.   #8 Escherichia coli UTI Patient had received IV Rocephin x3 days. D/C IV Rocephin 4 days ago. Resumed IV Rocephin in light of concern for bacterial meningitis.   #9 hypertension D/C IV Lopressor. Resume home dose coreg and norvasc 5 mg daily.  #10 history of CVA Aggrenox held for lumbar puncture 2 days ago. Patient alert, however platelets 62. Will defer to neurology, when to resume.  #  11 diabetes mellitus Hemoglobin A1c is 9.6. CBGs have ranged from 114-161. Decadron d/c'd.  Continue Levemir 45 units subcutaneous twice a day. Continue sliding scale insulin.  #12 hypothyroidism Continue Synthroid and Cytomel.  #13 anxiety/depression Stable.  #14 Nuitrition Placed panda tube yesterday. On TF. SLP for eval.Patient with diarrhea which started after TF. ??  #15. Thrombocytopenia Likely secondary to infection. No bleeding noted. Check peripheral smear, LDH, haptoglobin. Hold lovenox. Follow.  #16 prophylaxis  Pepcid for GI prophylaxis. SCDs for DVT prophylaxis.   Code Status: DNR Family Communication: Updated patient at bedside. Disposition Plan: Remain in SDU.   Consultants:  Nephrology: Dr. Eliott Nine 07/02/2013:  Neurology : Dr Thad Ranger 07/06/13  ID: Dr Luciana Axe 07/09/13  Procedures:  CT head 07/05/2013  KUB 07/05/2013  KUB 07/01/2013  Chest x-ray 07/01/2013  MRI head 07/06/13  EEG 07/06/13  LP 07/07/13  PICC line 07/09/13  Antibiotics:  IV Rocephin 07/01/2013--> 07/05/2013  IV Flagyl 07/04/2013-->07/10/13  Oral flagyl 07/10/13  IV vancomycin 07/06/2013  IV Rocephin 07/06/2013  IV ampicillin 07/06/2013--->07/09/13  IV Acyclovir 07/06/13  HPI/Subjective: Patient is alert, ff commands. Answering questions appropriately. No RUE jerking motions  Objective: Filed Vitals:   07/11/13 0800  BP: 147/67  Pulse: 80  Temp:   Resp: 15    Intake/Output Summary (Last 24 hours) at 07/11/13 0824 Last data filed at 07/11/13 0800  Gross per 24 hour  Intake 5446.17 ml  Output   2490 ml  Net 2956.17 ml   Filed Weights   07/09/13 0400 07/10/13 0000 07/11/13 0500  Weight: 89.2 kg (196 lb 10.4 oz) 94 kg (207 lb 3.7 oz) 96.4 kg (212 lb 8.4 oz)    Exam:   General:  Eyes open. Ff commands. NACPD  Cardiovascular: RRR  Respiratory: CTAB anterior lung fields.  Abdomen: Soft/NT/ND/+BS  Musculoskeletal: No c/c/e  Neuro: Patient is alert. Answering questions. Following commands. Squeezed my hands and wiggled toes when asked to. Neck  rigidity.  Data Reviewed: Basic Metabolic Panel:  Recent Labs Lab 07/06/13 0443  07/07/13 0335  07/08/13 0340 07/08/13 1327 07/09/13 0340 07/10/13 0515 07/11/13 0630  NA 152*  < > 151*  < > 153* 146* 144 136 139  K 2.6*  < > 3.5  < > 3.8 4.2 3.6 3.0* 4.1  CL 96  < > 106  < > 109 103 101 97 105  CO2 25  < > 32  < > 33* 32 34* 31 27  GLUCOSE 474*  < > 412*  < > 365* 300* 261* 202* 106*  BUN 25*  < > 23  < > 18 17 14 15 14   CREATININE 1.33*  < > 1.07  < > 0.94 0.88 0.82 0.71 0.68  CALCIUM 7.9*  < > 7.8*  < > 7.5* 7.4* 7.5* 7.2* 7.8*  MG 2.3  --   --   --  1.2*  --  1.5 1.5 1.6  PHOS 2.3  --  1.8*  --  2.8  2.7  --  2.1* 2.3  2.3 3.6  < > = values in this interval not displayed. Liver Function Tests:  Recent Labs Lab 07/06/13 0830 07/07/13 0335 07/08/13 0340 07/09/13 0340 07/10/13 0515 07/11/13 0630  AST 14  --   --   --   --   --   ALT 10  --   --   --   --   --   ALKPHOS 67  --   --   --   --   --  BILITOT 0.3  --   --   --   --   --   PROT 6.4  --   --   --   --   --   ALBUMIN 3.0* 2.8* 2.7* 2.6* 2.2* 2.2*   No results found for this basename: LIPASE, AMYLASE,  in the last 168 hours No results found for this basename: AMMONIA,  in the last 168 hours CBC:  Recent Labs Lab 07/06/13 0845 07/07/13 0335 07/08/13 0340 07/09/13 0340 07/10/13 0515 07/11/13 0630  WBC 5.0 4.6 5.0 6.4 7.5 5.7  NEUTROABS 2.9 3.2  --   --   --   --   HGB 13.0 12.3 12.4 12.6 12.0 11.2*  HCT 38.6 37.0 37.4 38.2 35.2* 33.6*  MCV 92.8 93.2 94.0 92.9 90.7 92.3  PLT 229 164 132* 112* 73* 62*   Cardiac Enzymes: No results found for this basename: CKTOTAL, CKMB, CKMBINDEX, TROPONINI,  in the last 168 hours BNP (last 3 results) No results found for this basename: PROBNP,  in the last 8760 hours CBG:  Recent Labs Lab 07/10/13 1248 07/10/13 1549 07/10/13 1918 07/11/13 0059 07/11/13 0354  GLUCAP 158* 164* 161* 141* 114*    Recent Results (from the past 240 hour(s))  URINE  CULTURE     Status: None   Collection Time    07/01/13 12:37 PM      Result Value Range Status   Specimen Description URINE, CATHETERIZED   Final   Special Requests NONE   Final   Culture  Setup Time     Final   Value: 07/01/2013 20:06     Performed at [facility] Count     Final   Value: >=100,000 COLONIES/ML     Performed at [facility]   Culture     Final   Value: ESCHERICHIA COLI     Performed at [facility]   Report Status 07/03/2013 FINAL   Final   Organism ID, Bacteria ESCHERICHIA COLI   Final  CULTURE, BLOOD (ROUTINE X 2)     Status: None   Collection Time    07/05/13 11:20 PM      Result Value Range Status   Specimen Description BLOOD LEFT HAND   Final   Special Requests BOTTLES DRAWN AEROBIC AND ANAEROBIC 3CC   Final   Culture  Setup Time     Final   Value: 07/06/2013 04:42     Performed at [facility]   Culture     Final   Value:        BLOOD CULTURE RECEIVED NO GROWTH TO DATE CULTURE WILL BE HELD FOR 5 DAYS BEFORE ISSUING A FINAL NEGATIVE REPORT     Performed at [facility]   Report Status PENDING   Incomplete  CULTURE, BLOOD (ROUTINE X 2)     Status: None   Collection Time    07/05/13 11:24 PM      Result Value Range Status   Specimen Description BLOOD RIGHT ANTECUBITAL   Final   Special Requests BOTTLES DRAWN AEROBIC AND ANAEROBIC 5CC   Final   Culture  Setup Time     Final   Value: 07/06/2013 04:42     Performed at [facility]   Culture     Final   Value:        BLOOD CULTURE RECEIVED NO GROWTH TO DATE CULTURE WILL BE HELD FOR 5 DAYS BEFORE ISSUING A FINAL NEGATIVE REPORT  Performed at [facility]   Report Status PENDING   Incomplete  URINE CULTURE     Status: None   Collection Time    07/06/13  8:01 AM      Result Value Range Status   Specimen Description URINE, CATHETERIZED   Final   Special Requests NONE   Final   Culture  Setup Time     Final   Value: 07/06/2013 10:49      Performed at [facility] Count     Final   Value: 40,000 COLONIES/ML     Performed at [facility]   Culture     Final   Value: YEAST     Performed at [facility]   Report Status 07/07/2013 FINAL   Final  CULTURE, BLOOD (SINGLE)     Status: None   Collection Time    07/06/13  8:45 AM      Result Value Range Status   Specimen Description BLOOD RIGHT ARM   Final   Special Requests BOTTLES DRAWN AEROBIC ONLY 3CC    Final   Culture  Setup Time     Final   Value: 07/06/2013 10:49     Performed at [facility]   Culture     Final   Value:        BLOOD CULTURE RECEIVED NO GROWTH TO DATE CULTURE WILL BE HELD FOR 5 DAYS BEFORE ISSUING A FINAL NEGATIVE REPORT     Performed at [facility]   Report Status PENDING   Incomplete  MRSA PCR SCREENING     Status: None   Collection Time    07/06/13  9:25 AM      Result Value Range Status   MRSA by PCR NEGATIVE  NEGATIVE Final   Comment:            The GeneXpert MRSA Assay (FDA     approved for NASAL specimens     only), is one component of a     comprehensive MRSA colonization     surveillance program. It is not     intended to diagnose MRSA     infection nor to guide or     monitor treatment for     MRSA infections.  GRAM STAIN     Status: None   Collection Time    07/07/13  5:40 PM      Result Value Range Status   Specimen Description CSF CSF   Final   Special Requests NONE   Final   Gram Stain     Final   Value: NO ORGANISMS SEEN     NO WBC SEEN     Gram Stain Report Called to,Read Back By and Verified With: AYERS,C. AT 1958 ON 09.26.14 BY LOVE,T.   Report Status 07/07/2013 FINAL   Final  BODY FLUID CULTURE     Status: None   Collection Time    07/07/13  5:42 PM      Result Value Range Status   Specimen Description Body Fluid   Final   Special Requests NONE   Final   Gram Stain     Final   Value: NO WBC SEEN     NO ORGANISMS SEEN     Performed at [facility]    Culture     Final   Value: NO GROWTH 3 DAYS     Performed at [facility]   Report Status PENDING  Incomplete     Studies: Dg Abd 1 View  07/09/2013   *RADIOLOGY REPORT*  Clinical Data: Band placement  ABDOMEN - 1 VIEW  Comparison: Prior abdominal radiograph earlier today at 11:49 a.m.  Findings: Frontal views of the abdomen demonstrate a weighted tip enteric feeding tube.  The tip of the tube overlies the right upper quadrant, likely within the first portion of the duodenum.  The bowel gas pattern is not obstructed.  No acute osseous abnormality. No large free air on this supine series.  IMPRESSION: The tip of the enteric feeding tube is likely in the first portion of the duodenum.   Original Report Authenticated By: Malachy Moan, M.D.   Dg Abd 1 View  07/09/2013   CLINICAL DATA:  Post feeding tube placement  EXAM: ABDOMEN - 1 VIEW  COMPARISON:  Portable exam 1149 hr compared to 07/05/2013  FINDINGS: Tip of feeding tube projects over distal stomach.  EKG leads project over chest and abdomen.  Normal bowel gas pattern.  Atelectasis versus infiltrate left lung base.  IMPRESSION: Tip of feeding tube projects over distal stomach.   Electronically Signed   By: Ulyses Southward M.D.   On: 07/09/2013 12:04    Scheduled Meds: . acyclovir  500 mg Intravenous Q8H  . antiseptic oral rinse  15 mL Mouth Rinse q12n4p  . cefTRIAXone (ROCEPHIN)  IV  2 g Intravenous Q12H  . chlorhexidine  15 mL Mouth Rinse BID  . famotidine (PEPCID) IV  20 mg Intravenous Daily  . feeding supplement (VITAL AF 1.2 CAL)  1,000 mL Per Tube Q24H  . insulin aspart  0-20 Units Subcutaneous Q4H  . insulin detemir  45 Units Subcutaneous BID  . latanoprost  1 drop Both Eyes QHS  . levETIRAcetam  500 mg Intravenous Q12H  . levothyroxine  75 mcg Per NG tube QAC breakfast  . liothyronine  25 mcg Per NG tube BID  . magnesium sulfate 1 - 4 g bolus IVPB  3 g Intravenous Once  . metoprolol      . metoprolol  10 mg Intravenous  Q6H  . metroNIDAZOLE  500 mg Per Tube Q8H  . sodium chloride  10-40 mL Intracatheter Q12H  . sodium chloride  3 mL Intravenous Q12H  . vancomycin  750 mg Intravenous Q12H   Continuous Infusions: . sodium chloride 0.9 % 1,000 mL with potassium chloride 40 mEq infusion 100 mL/hr at 07/10/13 0830    Principal Problem:   Acute encephalopathy Active Problems:   HYPOTHYROIDISM   DIABETES MELLITUS, TYPE II   HYPERLIPIDEMIA   UTI (urinary tract infection)   HTN (hypertension)   Anxiety and depression   CVA (cerebral infarction)   Weakness generalized   Dehydration   Acute renal failure   Failure to thrive   Diarrhea   Hyponatremia   Metabolic acidosis   Clostridium difficile colitis   Hypomagnesemia   Hypernatremia   Seizures   Meningitis, unspecified(322.9)   Protein-calorie malnutrition, severe    Time spent: > 35 mins    [facility]  [facility] Pager [phone]. If 7PM-7AM, please contact night-coverage at [email or web address], password [facility] 07/11/2013, 8:24 AM  LOS: 10 days

## 2013-07-11 NOTE — Progress Notes (Addendum)
NUTRITION FOLLOW UP  Intervention:   Discontinue Vital AF 1.2 enteral nutrition formula. In attempt to bulk stools, will change to fiber-containing formula; however given current dx of c diff colitis, expect it may take time for stools to return to baseline. Initiate Jevity 1.2 at 30 ml/hr, advance by 10 ml q 8 hours to goal of 60 ml/hr. Goal regimen will provide: 1728 kcal, 80 grams, 1162 ml free water. Pt will require additional free water once IVF discontinued. Diet per SLP. RD to continue to follow nutrition care plan.  Nutrition Dx:   Inadequate oral intake related to poor appetite as evidenced by ongoing weight loss and poor meal completion; unmet  Goal:   Pt to meet >/= 90% of their estimated nutrition needs; met  Monitor:   Weight, labs, I/O's, toleration of TF  Assessment:   PMHx significant for DM2, retinopathy, legal blindness, HTN, CVA, hypothyroidism, anxiety and depression. Noted pt with 2 recent admissions to [facility] within the past month. Pt was d/c'd to rehab for about 3 weeks after hospitalization. Pt developed diarrhea, poor appetite, and overall decline x 9 days at [facility]. Family thought decline was 2/2 lack of care at SNF, pt was then sent home. Remains weak and bed bound. Reported weight loss of 25 lb x 1 month.  Pt found positive for C. Difficile on 9/23. Pt transferred to ICU on 9/25 for fever and possible seizure activity. Per neuro, pt continues to improve.  Pt's weight was 234 lbs on 9/22 and has since dropped to 196 lbs 9/28. This reflects a 16% weight loss in less than 1 week. Refeeding syndrome labs reviewed: potassium, magnesium and phosphorus are all WNL.  MD called RD to discuss enteral nutrition regimen. Per MD, pt with increased diarrhea. Noted that pt has positive C diff colitis and is on antibiotics, this could be contributing to diarrhea. Will switch to fiber-containing formula in attempt to bulk stools.  Pt seen by SLP today, with recommendations for  NPO.  Presently with panda tube (tip is likely in the first portion of the duodenum, per radiology report.) Currently ordered for Vital AF 1.2 at 55 ml/hr. Goal regimen provides: 1584 kcal (100% of estimated needs), 101 grams of protein (100% of estimated needs), and 1071 ml of H2O. RN reports pt is tolerating regimen well at this time; pt now with flexiseal.  Also receiving NS with KCl 40 mEq infusion at 100 ml/hr.  Pt meets criteria for severe MALNUTRITION in the context of chronic illness as evidenced by intake of <75% x at least 1 month and wt loss of >5% x 1 month.  Height: Ht Readings from Last 1 Encounters:  07/01/13 5\' 2"  (1.575 m)    Weight Status:   Wt Readings from Last 1 Encounters:  07/11/13 212 lb 8.4 oz (96.4 kg)  220 lb - wt down 8 lb since admission  Re-estimated needs:  Kcal: 1550-1750 Protein: 80-95 g Fluid: Per MD  Skin: WNL  Diet Order: NPO   Intake/Output Summary (Last 24 hours) at 07/11/13 1257 Last data filed at 07/11/13 1200  Gross per 24 hour  Intake 6195.17 ml  Output   3092 ml  Net 3103.17 ml    Last BM: 9/30   Labs:   Recent Labs Lab 07/09/13 0340 07/10/13 0515 07/11/13 0630  NA 144 136 139  K 3.6 3.0* 4.1  CL 101 97 105  CO2 34* 31 27  BUN 14 15 14   CREATININE 0.82 0.71 0.68  CALCIUM  7.5* 7.2* 7.8*  MG 1.5 1.5 1.6  PHOS 2.1* 2.3  2.3 3.6  GLUCOSE 261* 202* 106*    CBG (last 3)   Recent Labs  07/11/13 0354 07/11/13 0848 07/11/13 1205  GLUCAP 114* 111* 170*    Scheduled Meds: . acyclovir  500 mg Intravenous Q8H  . amLODipine  5 mg Per NG tube Daily  . antiseptic oral rinse  15 mL Mouth Rinse q12n4p  . carvedilol  12.5 mg Per NG tube BID WC  . cefTRIAXone (ROCEPHIN)  IV  2 g Intravenous Q12H  . chlorhexidine  15 mL Mouth Rinse BID  . famotidine (PEPCID) IV  20 mg Intravenous Daily  . feeding supplement (VITAL AF 1.2 CAL)  1,000 mL Per Tube Q24H  . insulin aspart  0-20 Units Subcutaneous Q4H  . insulin detemir   45 Units Subcutaneous BID  . latanoprost  1 drop Both Eyes QHS  . levETIRAcetam  500 mg Intravenous Q12H  . levothyroxine  75 mcg Per NG tube QAC breakfast  . liothyronine  25 mcg Per NG tube BID  . metoprolol      . metroNIDAZOLE  500 mg Per Tube Q8H  . saccharomyces boulardii  250 mg Oral BID  . sodium chloride  10-40 mL Intracatheter Q12H  . sodium chloride  3 mL Intravenous Q12H  . vancomycin  750 mg Intravenous Q12H    Continuous Infusions: . sodium chloride 0.9 % 1,000 mL with potassium chloride 20 mEq infusion 100 mL/hr at 07/11/13 0935    Jarold Motto MS, RD, LDN Pager: [phone] After-hours pager: [phone]

## 2013-07-11 NOTE — Evaluation (Signed)
Physical Therapy Re-Evaluation Patient Details Name: ALORAH MCREE MRN: [id number] DOB: June 12, 1941 Today's Date: 07/11/2013 Time: 7829-5621 PT Time Calculation (min): 30 min  PT Assessment / Plan / Recommendation History of Present Illness  72 y.o. female has had a complicated medical course over the last 1 month. Patient is unable to provide history due to weakness and? Dementia. History is provided by patient's daughter [name] who is at the bedside. 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 She was recently admitted twice at [facility]-06/04/13-06/06/13 (DKA) & 06/07/13-06/09/13 (metabolic encephalopathy, uncontrolled DM and CVA). Following the last discharge, patient was sent to rehabilitation where she was for approximately 3 weeks. Patient has had a sustained rapid decline at SNF. 2 days after going to SNF, she developed profuse diarrhea of 9 days duration, extremely poor appetite, lethargy, laying in bed and unwilling to participate in therapies, decreased urine output and progressive generalized weakness.   Clinical Impression  Pt was able to participate in PT today, after three cancellations for medical reasons. Pt required +2 assist for all bed mobility secondary to impaired cognition and weakness. Pt more alert while seated EOB vs. Supine position, pt able to perform LE strengthening exercises with AAROM for LLE due to weakness.Pt would continue to benefit from skilled PT in order to improve functional mobility and safety.    PT Assessment  Patient needs continued PT services    Follow Up Recommendations  SNF;Supervision/Assistance - 24 hour    Does the patient have the potential to tolerate intense rehabilitation      Barriers to Discharge        Equipment Recommendations  Other (comment) (TBD upon ambulation evaluation)    Recommendations for Other Services     Frequency       Precautions / Restrictions Precautions Precautions: Fall   Pertinent Vitals/Pain No c/o pain during session.      Mobility  Bed Mobility Bed Mobility: Supine to Sit;Sitting - Scoot to [place] of Bed;Sit to Supine Supine to Sit: 1: +2 Total assist Supine to Sit: Patient Percentage: 10% Sitting - Scoot to Edge of Bed: 1: +2 Total assist Sitting - Scoot to Edge of Bed: Patient Percentage: 10% Sit to Supine: 1: +2 Total assist Sit to Supine: Patient Percentage: 10% Details for Bed Mobility Assistance: +2 assist for all bed mobility secondary to pt inconsistently following commands and general weakness (L UE/LE > R UE/LE). Increased time to perform bed mobility as lethargic while in supine position, with increased participation while seated EOB. VC's for hand placement and technique. Transfers Transfers: Not assessed Ambulation/Gait Ambulation/Gait Assistance: Not tested (comment)    Exercises General Exercises - Lower Extremity Ankle Circles/Pumps: Supine;Seated;AROM;AAROM;Both;20 reps (AAROM for LLE,  and increased time) Quad Sets: AROM;20 reps;Supine (increased time to perform exercises) Long Arc Quad: AROM;10 reps;Seated;Both (pt unable to obtain full extension with decreased control) Heel Slides: AAROM;Both;10 reps (increased time)   PT Diagnosis: Generalized weakness;Difficulty walking  PT Problem List: Decreased strength;Decreased activity tolerance;Decreased mobility;Decreased balance PT Treatment Interventions: Gait training;Functional mobility training;Therapeutic activities;Therapeutic exercise;Patient/family education;Balance training;Neuromuscular re-education     PT Goals(Current goals can be found in the care plan section) Acute Rehab PT Goals Patient Stated Goal: none specified PT Goal Formulation: With patient/family Time For Goal Achievement: 07/25/13 Potential to Achieve Goals: Fair  Visit Information  Last PT Received On: 07/11/13 Assistance Needed:  +2 History of Present Illness: 72 y.o. female has had  a complicated medical course over the last 1 month. Patient is unable to provide history due to weakness and? Dementia. History is provided by patient's daughter [name] who is at the bedside. 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 She was recently admitted twice at [facility]-06/04/13-06/06/13 (DKA) & 06/07/13-06/09/13 (metabolic encephalopathy, uncontrolled DM and CVA). Following the last discharge, patient was sent to rehabilitation where she was for approximately 3 weeks. Patient has had a sustained rapid decline at SNF. 2 days after going to SNF, she developed profuse diarrhea of 9 days duration, extremely poor appetite, lethargy, laying in bed and unwilling to participate in therapies, decreased urine output and progressive generalized weakness.        Prior Functioning  Home Living Family/patient expects to be discharged to:: Skilled nursing facility Living Arrangements: Other (Comment) (SNF) Available Help at Discharge: Family;Available PRN/intermittently Type of Home: House Home Access: Stairs to enter [employer] of Steps: 1 Entrance Stairs-Rails: None Home Layout: One level Home Equipment: Cane - single point;Walker - standard Prior Function Level of Independence: Independent with assistive device(s) Communication Communication: Other (comment) (Pt lethargic upon entering room, one word reponses)    Cognition  Cognition Arousal/Alertness: Lethargic Behavior During Therapy: WFL for tasks assessed/performed Overall Cognitive Status: Impaired/Different from baseline Area of Impairment: Orientation;Following commands Orientation Level: Disoriented to;Place;Time;Situation Following Commands: Follows one step commands inconsistently General Comments: Pt able to follow one step commands inconsistenly during bed  mobility and LE strengthening exercises.    Extremity/Trunk Assessment Lower Extremity Assessment Lower Extremity Assessment: Generalized weakness;RLE deficits/detail;LLE deficits/detail RLE Deficits / Details: Pt unable to move LE's in full ROM against gravity, gross strength in all muscle groups: 2/5. LLE Deficits / Details: Pt unable to move LE's in full ROM against gravity, gross strength in all muscle groups: 2/5.   Balance Balance Balance Assessed: Yes Static Sitting Balance Static Sitting - Balance Support: Bilateral upper extremity supported Static Sitting - Level of Assistance: 2: Max assist Static Sitting - Comment/# of Minutes: While sitting EOB, pt's level of assist was variable as pt would fatigue easily but was able to maintain balance for approx. 30 seconds with min guard before requiring mod-max assist to remain upright. VC's to improve posture.  End of Session PT - End of Session Equipment Utilized During Treatment: Oxygen Activity Tolerance: Patient limited by fatigue Patient left: in bed;with call bell/phone within reach;with family/visitor present Nurse Communication: Mobility status  GP     Sol Blazing 07/11/2013, 4:40 PM

## 2013-07-11 NOTE — Evaluation (Signed)
I have reviewed this note and agree with all findings. Kati Tracker Mance, PT, DPT Pager: [phone]   

## 2013-07-12 DIAGNOSIS — R404 Transient alteration of awareness: Secondary | ICD-10-CM

## 2013-07-12 DIAGNOSIS — A049 Bacterial intestinal infection, unspecified: Secondary | ICD-10-CM

## 2013-07-12 LAB — CULTURE, BLOOD (SINGLE): Culture: NO GROWTH

## 2013-07-12 LAB — CBC
HCT: 32.2 % — ABNORMAL LOW (ref 36.0–46.0)
Hemoglobin: 10.6 g/dL — ABNORMAL LOW (ref 12.0–15.0)
MCH: 30.8 pg (ref 26.0–34.0)
MCHC: 32.9 g/dL (ref 30.0–36.0)
MCV: 93.6 fL (ref 78.0–100.0)
Platelets: 59 10*3/uL — ABNORMAL LOW (ref 150–400)
RDW: 14.8 % (ref 11.5–15.5)

## 2013-07-12 LAB — RENAL FUNCTION PANEL
Albumin: 2.2 g/dL — ABNORMAL LOW (ref 3.5–5.2)
BUN: 11 mg/dL (ref 6–23)
Calcium: 8.1 mg/dL — ABNORMAL LOW (ref 8.4–10.5)
Creatinine, Ser: 0.64 mg/dL (ref 0.50–1.10)
GFR calc Af Amer: >90 mL/min (ref >90)
Glucose, Bld: 250 mg/dL — ABNORMAL HIGH (ref 70–99)
Phosphorus: 3.6 mg/dL (ref 2.3–4.6)
Sodium: 140 mEq/L (ref 135–145)

## 2013-07-12 LAB — GLUCOSE, CAPILLARY
Glucose-Capillary: 198 mg/dL — ABNORMAL HIGH (ref 70–99)
Glucose-Capillary: 214 mg/dL — ABNORMAL HIGH (ref 70–99)
Glucose-Capillary: 246 mg/dL — ABNORMAL HIGH (ref 70–99)
Glucose-Capillary: 278 mg/dL — ABNORMAL HIGH (ref 70–99)

## 2013-07-12 LAB — CULTURE, BLOOD (ROUTINE X 2)
Culture: NO GROWTH
Culture: NO GROWTH

## 2013-07-12 MED ORDER — INSULIN ASPART 100 UNIT/ML ~~LOC~~ SOLN: 3.0000 [IU] | Freq: Three times a day (TID) | SUBCUTANEOUS | Status: DC

## 2013-07-12 MED ORDER — INSULIN DETEMIR 100 UNIT/ML ~~LOC~~ SOLN
50.0000 [IU] | Freq: Two times a day (BID) | SUBCUTANEOUS | Status: DC
  Administered 2013-07-12 – 2013-07-17 (×11): 50 [IU] via SUBCUTANEOUS
  Filled 2013-07-12 (×15): qty 0.5

## 2013-07-12 NOTE — Progress Notes (Signed)
[facility]  Day # 7 vancomycin, ceftriaxone, acyclovir Day #1 vancomycin (9 days of flagyl prior to this)   Subjective: Not able to answer questions   Antibiotics:  Anti-infectives   Start     Dose/Rate Route Frequency Ordered Stop   07/11/13 1800  vancomycin (VANCOCIN) 50 mg/mL oral solution 125 mg     125 mg Per Tube 4 times per day 07/11/13 1526     07/10/13 1100  metroNIDAZOLE (FLAGYL) tablet 500 mg  Status:  Discontinued     500 mg Per Tube 3 times per day 07/10/13 1038 07/11/13 1526   07/10/13 1000  metroNIDAZOLE (FLAGYL) 50 mg/ml oral suspension 500 mg  Status:  Discontinued     500 mg Per Tube 3 times daily 07/10/13 0901 07/10/13 1038   07/08/13 1400  acyclovir (ZOVIRAX) 500 mg in dextrose 5 % 100 mL IVPB  Status:  Discontinued     500 mg 110 mL/hr over 60 Minutes Intravenous 3 times per day 07/08/13 1128 07/12/13 1459   07/07/13 1000  vancomycin (VANCOCIN) 1,500 mg in sodium chloride 0.9 % 500 mL IVPB  Status:  Discontinued     1,500 mg 250 mL/hr over 120 Minutes Intravenous Every 24 hours 07/06/13 0912 07/06/13 1508   07/07/13 1000  vancomycin (VANCOCIN) 1,250 mg in sodium chloride 0.9 % 250 mL IVPB  Status:  Discontinued     1,250 mg 166.7 mL/hr over 90 Minutes Intravenous Every 24 hours 07/06/13 1508 07/07/13 0834   07/07/13 1000  vancomycin (VANCOCIN) IVPB 750 mg/150 ml premix  Status:  Discontinued     750 mg 150 mL/hr over 60 Minutes Intravenous Every 12 hours 07/07/13 0835 07/12/13 1459   07/06/13 1530  acyclovir (ZOVIRAX) 500 mg in dextrose 5 % 100 mL IVPB  Status:  Discontinued     500 mg 110 mL/hr over 60 Minutes Intravenous Every 12 hours 07/06/13 1500 07/08/13 1128   07/06/13 1000  cefTRIAXone (ROCEPHIN) 2 g in dextrose 5 % 50 mL IVPB  Status:  Discontinued     2 g 100 mL/hr over 30 Minutes Intravenous Every 12 hours 07/06/13 0800 07/12/13 1459   07/06/13 0900  ampicillin (OMNIPEN) 2 g in sodium chloride 0.9 % 50 mL IVPB   Status:  Discontinued     2 g 150 mL/hr over 20 Minutes Intravenous Every 4 hours 07/06/13 0807 07/09/13 1108   07/06/13 0830  ampicillin (OMNIPEN) 2 g in sodium chloride 0.9 % 50 mL IVPB  Status:  Discontinued     2 g 150 mL/hr over 20 Minutes Intravenous 6 times per day 07/06/13 0800 07/06/13 0807   07/06/13 0830  vancomycin (VANCOCIN) 2,000 mg in sodium chloride 0.9 % 500 mL IVPB     2,000 mg 250 mL/hr over 120 Minutes Intravenous STAT 07/06/13 0822 07/06/13 1048   07/04/13 0600  metroNIDAZOLE (FLAGYL) IVPB 500 mg  Status:  Discontinued     500 mg 100 mL/hr over 60 Minutes Intravenous Every 8 hours 07/04/13 0547 07/10/13 0901   07/01/13 1415  cefTRIAXone (ROCEPHIN) 1 g in dextrose 5 % 50 mL IVPB  Status:  Discontinued     1 g 100 mL/hr over 30 Minutes Intravenous Every 24 hours 07/01/13 1408 07/05/13 1548      Medications: Scheduled Meds: . amLODipine  10 mg Per NG tube Daily  . antiseptic oral rinse  15 mL Mouth Rinse q12n4p  . carvedilol  12.5 mg Per NG tube BID WC  .  chlorhexidine  15 mL Mouth Rinse BID  . famotidine (PEPCID) IV  20 mg Intravenous Daily  . insulin aspart  0-20 Units Subcutaneous Q4H  . insulin detemir  50 Units Subcutaneous BID  . latanoprost  1 drop Both Eyes QHS  . levETIRAcetam  500 mg Intravenous Q12H  . levothyroxine  75 mcg Per NG tube QAC breakfast  . liothyronine  25 mcg Per NG tube BID  . saccharomyces boulardii  250 mg Oral BID  . sodium chloride  10-40 mL Intracatheter Q12H  . sodium chloride  3 mL Intravenous Q12H  . vancomycin  125 mg Per Tube Q6H   Continuous Infusions: . feeding supplement (JEVITY 1.2 CAL) 1,000 mL (07/12/13 0932)  . sodium chloride 0.9 % 1,000 mL with potassium chloride 20 mEq infusion 100 mL/hr at 07/12/13 0948   PRN Meds:.acetaminophen, acetaminophen, albuterol, hydrALAZINE, LORazepam, ondansetron (ZOFRAN) IV, promethazine, sodium chloride   Objective: Weight change: 13 lb 14 oz (6.294 kg)  Intake/Output Summary  (Last 24 hours) at 07/12/13 1627 Last data filed at 07/12/13 1500  Gross per 24 hour  Intake    220 ml  Output   2527 ml  Net  -2307 ml   Blood pressure 151/57, pulse 95, temperature 97.7 F (36.5 C), temperature source Oral, resp. rate 18, height 5\' 2"  (1.575 m), weight 227 lb 1.6 oz (103.012 kg), SpO2 99.00%. Temp:  [97.7 F (36.5 C)-98.4 F (36.9 C)] 97.7 F (36.5 C) (10/01 1500) Pulse Rate:  [79-119] 95 (10/01 1500) Resp:  [16-20] 18 (10/01 1500) BP: (141-178)/(56-72) 151/57 mmHg (10/01 1500) SpO2:  [99 %-100 %] 99 % (10/01 1500) Weight:  [226 lb 6.4 oz (102.694 kg)-227 lb 1.6 oz (103.012 kg)] 227 lb 1.6 oz (103.012 kg) (10/01 0441)  Physical Exam: General: awake, NG tube in place not following commands HEENT: anicteric sclera, EOMI CVS regular rate, normal r,  no murmur rubs or gallops Chest: clear to auscultation bilaterally, no wheezing, rales or rhonchi Abdomen: soft nontender, nondistended, normal bowel sounds, Extremities: no  clubbing or edema noted bilaterally Skin: no rashes  Neuro: left leg, foot moves less in response to noxious stimuli vs right which is hyperactive  Lab Results:  Recent Labs  07/11/13 0630 07/12/13 0505  WBC 5.7 6.2  HGB 11.2* 10.6*  HCT 33.6* 32.2*  PLT 62* 59*    BMET  Recent Labs  07/11/13 0630 07/12/13 0505  NA 139 140  K 4.1 4.2  CL 105 106  CO2 27 26  GLUCOSE 106* 250*  BUN 14 11  CREATININE 0.68 0.64  CALCIUM 7.8* 8.1*    Micro Results: Recent Results (from the past 240 hour(s))  CULTURE, BLOOD (ROUTINE X 2)     Status: None   Collection Time    07/05/13 11:20 PM      Result Value Range Status   Specimen Description BLOOD LEFT HAND   Final   Special Requests BOTTLES DRAWN AEROBIC AND ANAEROBIC 3CC   Final   Culture  Setup Time     Final   Value: 07/06/2013 04:42     Performed at [facility]   Culture     Final   Value: NO GROWTH 5 DAYS     Performed at [facility]   Report Status  07/12/2013 FINAL   Final  CULTURE, BLOOD (ROUTINE X 2)     Status: None   Collection Time    07/05/13 11:24 PM      Result Value Range  Status   Specimen Description BLOOD RIGHT ANTECUBITAL   Final   Special Requests BOTTLES DRAWN AEROBIC AND ANAEROBIC 5CC   Final   Culture  Setup Time     Final   Value: 07/06/2013 04:42     Performed at [facility]   Culture     Final   Value: NO GROWTH 5 DAYS     Performed at [facility]   Report Status 07/12/2013 FINAL   Final  URINE CULTURE     Status: None   Collection Time    07/06/13  8:01 AM      Result Value Range Status   Specimen Description URINE, CATHETERIZED   Final   Special Requests NONE   Final   Culture  Setup Time     Final   Value: 07/06/2013 10:49     Performed at [facility] Count     Final   Value: 40,000 COLONIES/ML     Performed at [facility]   Culture     Final   Value: YEAST     Performed at [facility]   Report Status 07/07/2013 FINAL   Final  CULTURE, BLOOD (SINGLE)     Status: None   Collection Time    07/06/13  8:45 AM      Result Value Range Status   Specimen Description BLOOD RIGHT ARM   Final   Special Requests BOTTLES DRAWN AEROBIC ONLY 3CC    Final   Culture  Setup Time     Final   Value: 07/06/2013 10:49     Performed at [facility]   Culture     Final   Value: NO GROWTH 5 DAYS     Performed at [facility]   Report Status 07/12/2013 FINAL   Final  MRSA PCR SCREENING     Status: None   Collection Time    07/06/13  9:25 AM      Result Value Range Status   MRSA by PCR NEGATIVE  NEGATIVE Final   Comment:            The GeneXpert MRSA Assay (FDA     approved for NASAL specimens     only), is one component of a     comprehensive MRSA colonization     surveillance program. It is not     intended to diagnose MRSA     infection nor to guide or     monitor treatment for     MRSA infections.  GRAM STAIN     Status: None    Collection Time    07/07/13  5:40 PM      Result Value Range Status   Specimen Description CSF CSF   Final   Special Requests NONE   Final   Gram Stain     Final   Value: NO ORGANISMS SEEN     NO WBC SEEN     Gram Stain Report Called to,Read Back By and Verified With: AYERS,C. AT 1958 ON 09.26.14 BY LOVE,T.   Report Status 07/07/2013 FINAL   Final  BODY FLUID CULTURE     Status: None   Collection Time    07/07/13  5:42 PM      Result Value Range Status   Specimen Description Body Fluid   Final   Special Requests NONE   Final   Gram Stain     Final   Value: NO WBC SEEN  NO ORGANISMS SEEN     Performed at [facility]   Culture     Final   Value: NO GROWTH 3 DAYS     Performed at [facility]   Report Status 07/11/2013 FINAL   Final    Studies/Results: No results found.    Assessment/Plan: Regina [name] is a 72 y.o. female with  Admission with obtundation, diarrhea, C difficile colitis (positive on GI pathogen panel) also rx with IV abx for possible meningoencephalitis. LP on abx was bland and MRI brani unremarkable. She is still obtunded and with apparentl left sided weakness  #1 C difficile colitis: --continue vancomycin per NG for another 14 days --all other abx dcd at present --avoid ppi, h2 blockers  #2 Left sided weakness: ordered MRI L spine to ensure no injury or infection there  #3 Screening: check HIV, hep c  #4 Obtundation: not clear what cause is likely multifactorial   LOS: 11 days   Acey Lav 07/12/2013, 4:27 PM

## 2013-07-12 NOTE — Progress Notes (Signed)
Inpatient Diabetes Program Recommendations  AACE/ADA: New Consensus Statement on Inpatient Glycemic Control (2013)  Target Ranges:  Prepandial:   less than 140 mg/dL      Peak postprandial:   less than 180 mg/dL (1-2 hours)      Critically ill patients:  140 - 180 mg/dL    Results for MIOSOTIS, WETSEL (MRN [id number]) as of 07/12/2013 13:44  Ref. Range 07/11/2013 00:59 07/11/2013 03:54 07/11/2013 08:48 07/11/2013 12:05 07/11/2013 15:35 07/11/2013 18:26 07/11/2013 19:55  Glucose-Capillary Latest Range: 70-99 mg/dL 213 (H) 086 (H) 578 (H) 170 (H) 170 (H) 173 (H) 157 (H)    Results for AUSET, FRITZLER (MRN [id number]) as of 07/12/2013 13:44  Ref. Range 07/12/2013 00:15 07/12/2013 03:51 07/12/2013 07:52 07/12/2013 11:41  Glucose-Capillary Latest Range: 70-99 mg/dL 413 (H) 244 (H) 010 (H) 214 (H)    **Noted Jevity continuous tube feeds finally now at goal at 60cc/hour.  CBGs have risen since yesterday.   **MD- Recommend the following in-hospital insulin adjustments:  1. Please consider adding scheduled Novolog tube feed coverage to help cover the carbohydrates in patient's TF- Novolog 4 units Q4 hours (hold if tube feeds held for any reason) 2. Continue current SSI and Lantus    Will follow. Ambrose Finland RN, MSN, CDE Diabetes Coordinator Inpatient Diabetes Program Team Pager: [phone] (8a-10p)

## 2013-07-12 NOTE — Progress Notes (Addendum)
[facility] PROGRESS NOTE  Regina [name] UJW:[id number] DOB: May 21, 1941 DOA: 07/01/2013 PCP: Michiel Sites, MD  Assessment/Plan: #1 acute encephalopathy Questionable etiology. Clinical improvement. Patient alert, ff commands, answering questions. May be secondary to seizures in the setting of probable bacterial meningitis with patient with nuchal rigidity and a rectal temperature of 102.9 with altered mental status. Per nursing patient with right upper extremity jerking motions 5 nights ago. No further jerking movements. CT of the head was negative. MRI of the head is negative for any acute infarct. EEG is negative for any epileptiform discharges. Patient was hypokalemic and potassium repleted.  Potassium today is 4.1. Keep potassium greater than 4.  Placed on Ativan 1 mg IV every 2 hours when necessary. Neuro checks every 4 hours. S/P lumbar puncture, however had been on antibiotic prior to LP. CSF cultures neg. CSF HSV cultures pending.  Aggrenox and Lovenox which have been discontinued. Repeat blood cultures pending. Replete electrolytes.  Was empirically on IV vancomycin, Rocephin, acyclovir. Continue IV Keppra. Neurology and ID following and appreciate input and recommendations.  #2 probable seizure Patient noted to have jerking motion of the right upper extremity mostly throughout the night  5 days ago in addition to altered mental status. Patient's eyes rolled and deviated to the right. Patient also noted to have a fever. CT of the head was negative. MRI of the head was negative. EEG was negative. Significant improvement in her jerking in the right upper extremity. Patient was loaded with IV Keppra and is on Keppra IV every 12 hours. Cultures are pending. Continue empiric IV vancomycin, Rocephin, acyclovir  for possible meningitis. Neuro checks. Neurology following and appreciate input and recommendations.   #3 possible meningitis/CSF infection  Patient noted to have a rectal  temperature 102.9  5 days ago. Fever curve trending down. Patient had significant nuchal rigidity. Patient with altered mental status, but improving daily. Blood cultures are pending. Patient s/p LP .  MRI head negative. EEG negative. Culture from CSF neg. HSV CSF cultures pending, patient however was on empiric IV antibiotics for 2-3 days prior to LP. Continue to hold Aggrenox and Lovenox for now as platelets at 62. Mental status  improving as patient ff some commands, answering questions, alert. Continue IV vancomycin, Rocephin, acyclovir to cover empirically for CSF infection. Neurology following and appreciate your input and recommendations. Ampicillin and decadron d/c'd per ID. ID and neuro FF and appreciate input and rxcs. Per ID stop vanc, rocephin, acyclovir today.  #4 acute renal failure Likely secondary to volume depletion and hypotension in the setting of ARB. Clinical improvement. Renal function with creatinine now of 0.68. Good urine output with 2400 cc over the past 24 hours. Continue Foley catheter. Bicarbonate drip has been discontinued as acidosis has resolved. Continue to hold ARB and metformin.   #5 hypokalemia/hypomagnesemia Likely secondary to GI losses from C. difficile colitis and increased urinary output. Replete.Keep magnesium at 2.   #6 C. difficile colitis Continue oral Flagyl to per panda. Follow.  #7 hypernatremia Sodium levels improved and now at 139. Decrease IVF. follow.   #8 Escherichia coli UTI Patient had received IV Rocephin x3 days. D/C IV Rocephin 4 days ago.   #9 hypertension D/C IV Lopressor. Resume home dose coreg and norvasc 5 mg daily.  #10 history of CVA Aggrenox held for lumbar puncture 2 days ago. Patient alert, however platelets 62. Will defer to neurology, when to resume.  #11 diabetes mellitus Hemoglobin A1c is 9.6. CBGs have ranged from 114-161.  Decadron d/c'd. Continue Levemir 45 units subcutaneous twice a day. Continue sliding scale  insulin.  #12 hypothyroidism Continue Synthroid and Cytomel.  #13 anxiety/depression Stable.  #14 Nuitrition Placed panda tube yesterday. On TF. SLP for eval.Patient with diarrhea which started after TF. ??  #15. Thrombocytopenia Likely secondary to infection. No bleeding noted. Check peripheral smear, LDH, haptoglobin. Hold lovenox. Follow.  #16 prophylaxis  Pepcid for GI prophylaxis. SCDs for DVT prophylaxis.   Code Status: DNR Family Communication: Updated patient at bedside. Disposition Plan: Remain in SDU.   Consultants:  Nephrology: Dr. Eliott Nine 07/02/2013:  Neurology : Dr Thad Ranger 07/06/13  ID: Dr Luciana Axe 07/09/13  Procedures:  CT head 07/05/2013  KUB 07/05/2013  KUB 07/01/2013  Chest x-ray 07/01/2013  MRI head 07/06/13  EEG 07/06/13  LP 07/07/13  PICC line 07/09/13  Antibiotics:  IV Rocephin 07/01/2013--> 07/05/2013  IV Flagyl 07/04/2013-->07/10/13  Oral flagyl 07/10/13  IV vancomycin 07/06/2013  IV Rocephin 07/06/2013  IV ampicillin 07/06/2013--->07/09/13  IV Acyclovir 07/06/13  HPI/Subjective: Patient is alert, ff commands. Answering questions appropriately. RUE jerking motions  Objective: Filed Vitals:   07/12/13 1500  BP: 151/57  Pulse: 95  Temp: 97.7 F (36.5 C)  Resp: 18    Intake/Output Summary (Last 24 hours) at 07/12/13 1703 Last data filed at 07/12/13 1500  Gross per 24 hour  Intake    100 ml  Output   2527 ml  Net  -2427 ml   Filed Weights   07/11/13 0500 07/11/13 1650 07/12/13 0441  Weight: 96.4 kg (212 lb 8.4 oz) 102.694 kg (226 lb 6.4 oz) 103.012 kg (227 lb 1.6 oz)    Exam:   General:  Eyes open. Ff commands. NACPD  Cardiovascular: RRR  Respiratory: CTAB anterior lung fields.  Abdomen: Soft/NT/ND/+BS  Musculoskeletal: No c/c/e  Neuro: Patient is alert. Answering questions. Following commands. Squeezed my hands and wiggled toes when asked to. Neck rigidity.  Data Reviewed: Basic Metabolic Panel:  Recent  Labs Lab 07/08/13 0340 07/08/13 1327 07/09/13 0340 07/10/13 0515 07/11/13 0630 07/12/13 0505  NA 153* 146* 144 136 139 140  K 3.8 4.2 3.6 3.0* 4.1 4.2  CL 109 103 101 97 105 106  CO2 33* 32 34* 31 27 26   GLUCOSE 365* 300* 261* 202* 106* 250*  BUN 18 17 14 15 14 11   CREATININE 0.94 0.88 0.82 0.71 0.68 0.64  CALCIUM 7.5* 7.4* 7.5* 7.2* 7.8* 8.1*  MG 1.2*  --  1.5 1.5 1.6 1.7  PHOS 2.8  2.7  --  2.1* 2.3  2.3 3.6 3.6   Liver Function Tests:  Recent Labs Lab 07/06/13 0830  07/08/13 0340 07/09/13 0340 07/10/13 0515 07/11/13 0630 07/12/13 0505  AST 14  --   --   --   --   --   --   ALT 10  --   --   --   --   --   --   ALKPHOS 67  --   --   --   --   --   --   BILITOT 0.3  --   --   --   --   --   --   PROT 6.4  --   --   --   --   --   --   ALBUMIN 3.0*  < > 2.7* 2.6* 2.2* 2.2* 2.2*  < > = values in this interval not displayed. No results found for this basename: LIPASE, AMYLASE,  in the last 168  hours No results found for this basename: AMMONIA,  in the last 168 hours CBC:  Recent Labs Lab 07/06/13 0845 07/07/13 0335 07/08/13 0340 07/09/13 0340 07/10/13 0515 07/11/13 0630 07/12/13 0505  WBC 5.0 4.6 5.0 6.4 7.5 5.7 6.2  NEUTROABS 2.9 3.2  --   --   --  3.5  --   HGB 13.0 12.3 12.4 12.6 12.0 11.2* 10.6*  HCT 38.6 37.0 37.4 38.2 35.2* 33.6* 32.2*  MCV 92.8 93.2 94.0 92.9 90.7 92.3 93.6  PLT 229 164 132* 112* 73* 62* 59*   Cardiac Enzymes: No results found for this basename: CKTOTAL, CKMB, CKMBINDEX, TROPONINI,  in the last 168 hours BNP (last 3 results) No results found for this basename: PROBNP,  in the last 8760 hours CBG:  Recent Labs Lab 07/11/13 1955 07/12/13 0015 07/12/13 0351 07/12/13 0752 07/12/13 1141  GLUCAP 157* 278* 246* 198* 214*    Recent Results (from the past 240 hour(s))  CULTURE, BLOOD (ROUTINE X 2)     Status: None   Collection Time    07/05/13 11:20 PM      Result Value Range Status   Specimen Description BLOOD LEFT HAND    Final   Special Requests BOTTLES DRAWN AEROBIC AND ANAEROBIC 3CC   Final   Culture  Setup Time     Final   Value: 07/06/2013 04:42     Performed at [facility]   Culture     Final   Value: NO GROWTH 5 DAYS     Performed at [facility]   Report Status 07/12/2013 FINAL   Final  CULTURE, BLOOD (ROUTINE X 2)     Status: None   Collection Time    07/05/13 11:24 PM      Result Value Range Status   Specimen Description BLOOD RIGHT ANTECUBITAL   Final   Special Requests BOTTLES DRAWN AEROBIC AND ANAEROBIC 5CC   Final   Culture  Setup Time     Final   Value: 07/06/2013 04:42     Performed at [facility]   Culture     Final   Value: NO GROWTH 5 DAYS     Performed at [facility]   Report Status 07/12/2013 FINAL   Final  URINE CULTURE     Status: None   Collection Time    07/06/13  8:01 AM      Result Value Range Status   Specimen Description URINE, CATHETERIZED   Final   Special Requests NONE   Final   Culture  Setup Time     Final   Value: 07/06/2013 10:49     Performed at [facility] Count     Final   Value: 40,000 COLONIES/ML     Performed at [facility]   Culture     Final   Value: YEAST     Performed at [facility]   Report Status 07/07/2013 FINAL   Final  CULTURE, BLOOD (SINGLE)     Status: None   Collection Time    07/06/13  8:45 AM      Result Value Range Status   Specimen Description BLOOD RIGHT ARM   Final   Special Requests BOTTLES DRAWN AEROBIC ONLY 3CC    Final   Culture  Setup Time     Final   Value: 07/06/2013 10:49     Performed at [facility]   Culture     Final  Value: NO GROWTH 5 DAYS     Performed at [facility]   Report Status 07/12/2013 FINAL   Final  MRSA PCR SCREENING     Status: None   Collection Time    07/06/13  9:25 AM      Result Value Range Status   MRSA by PCR NEGATIVE  NEGATIVE Final   Comment:            The GeneXpert MRSA Assay (FDA     approved  for NASAL specimens     only), is one component of a     comprehensive MRSA colonization     surveillance program. It is not     intended to diagnose MRSA     infection nor to guide or     monitor treatment for     MRSA infections.  GRAM STAIN     Status: None   Collection Time    07/07/13  5:40 PM      Result Value Range Status   Specimen Description CSF CSF   Final   Special Requests NONE   Final   Gram Stain     Final   Value: NO ORGANISMS SEEN     NO WBC SEEN     Gram Stain Report Called to,Read Back By and Verified With: AYERS,C. AT 1958 ON 09.26.14 BY LOVE,T.   Report Status 07/07/2013 FINAL   Final  BODY FLUID CULTURE     Status: None   Collection Time    07/07/13  5:42 PM      Result Value Range Status   Specimen Description Body Fluid   Final   Special Requests NONE   Final   Gram Stain     Final   Value: NO WBC SEEN     NO ORGANISMS SEEN     Performed at [facility]   Culture     Final   Value: NO GROWTH 3 DAYS     Performed at [facility]   Report Status 07/11/2013 FINAL   Final     Studies: No results found.  Scheduled Meds: . amLODipine  10 mg Per NG tube Daily  . antiseptic oral rinse  15 mL Mouth Rinse q12n4p  . carvedilol  12.5 mg Per NG tube BID WC  . chlorhexidine  15 mL Mouth Rinse BID  . famotidine (PEPCID) IV  20 mg Intravenous Daily  . insulin aspart  0-20 Units Subcutaneous Q4H  . insulin detemir  50 Units Subcutaneous BID  . latanoprost  1 drop Both Eyes QHS  . levETIRAcetam  500 mg Intravenous Q12H  . levothyroxine  75 mcg Per NG tube QAC breakfast  . liothyronine  25 mcg Per NG tube BID  . saccharomyces boulardii  250 mg Oral BID  . sodium chloride  10-40 mL Intracatheter Q12H  . sodium chloride  3 mL Intravenous Q12H  . vancomycin  125 mg Per Tube Q6H   Continuous Infusions: . feeding supplement (JEVITY 1.2 CAL) 1,000 mL (07/12/13 0932)  . sodium chloride 0.9 % 1,000 mL with potassium chloride 20 mEq infusion 100  mL/hr at 07/12/13 1610    Principal Problem:   Acute encephalopathy Active Problems:   HYPOTHYROIDISM   DIABETES MELLITUS, TYPE II   HYPERLIPIDEMIA   UTI (urinary tract infection)   HTN (hypertension)   Anxiety and depression   CVA (cerebral infarction)   Weakness generalized   Dehydration   Acute renal failure   Failure  to thrive   Diarrhea   Hyponatremia   Metabolic acidosis   Clostridium difficile colitis   Hypomagnesemia   Hypernatremia   Seizures   Meningitis, unspecified(322.9)   Protein-calorie malnutrition, severe   Thrombocytopenia, unspecified    Time spent: > 35 mins    Ketina Mars  [facility] Pager [phone] If 7PM-7AM, please contact night-coverage at [email or web address], password [facility] 07/12/2013, 5:03 PM  LOS: 11 days

## 2013-07-12 NOTE — Plan of Care (Signed)
Problem: Acute Rehab OT Goals (only OT should resolve) Goal: Pt. Will Transfer To Toilet Outcome: Not Met (add Reason) Decline in status    Goal: Pt. Will Perform Toileting-Clothing Manipulation Outcome: Not Met (add Reason) Decline in status    Goal: OT Additional ADL Goal #1 Outcome: Not Met (add Reason) Decline in status

## 2013-07-12 NOTE — Plan of Care (Signed)
Problem: Acute Rehab OT Goals (only OT should resolve) Goal: Pt. Will Perform Upper Body Bathing Outcome: Not Met (add Reason) Decline in status    Goal: Pt. Will Perform Lower Body Bathing Outcome: Not Met (add Reason) Decline in status

## 2013-07-12 NOTE — Progress Notes (Signed)
Speech Language Pathology Dysphagia Treatment Patient Details Name: Regina [name] MRN: [id number] DOB: 11-Nov-1940 Today's Date: 07/12/2013 Time: 4098-1191 SLP Time Calculation (min): 52 min  Assessment / Plan / Recommendation Clinical Impression  Pt. continues to have significant cognitive deficits that interfere with her ability to cooperate in evaluation. Pt. initially would take a small  bite of applesauce from spoon, and just hold it on her tongue with lips remaining open.  No tongue movement or any type of bolus manipulation was made, despite max verbal and tactile cues.  SLP suctioned bolus from mouth.   Pt. did take and swallow sips of water from a spoon, and later was able to swallow the applesauce boluses.  Cognition and responsiveness waxes and wanes even within a few minutes.  It is doubtful that pt. will cooperate in a MBS at this point.  Will continue po trials at bedside with SLP, and continue sips of water by spoon after oral care,with RN.  SLP will reassess for readiness for po diet vs. ability to cooperate in [facility] 10/2. Discussed with MD and pt's niece, who are in agreement.    Diet Recommendation  Continue with Current Diet: NPO    SLP Plan     Pertinent Vitals/Pain n/a   Swallowing Goals  SLP Swallowing Goals Goal #3: Pt will swallow melted ice chips without clinical s/s of aspiration to aid in determining readiness for po intake/instrumental swallow evaluation.   Swallow Study Goal #3 - Progress: Partly met Swallow Study Goal #4 - Progress: Not met  General Temperature Spikes Noted: No Respiratory Status: Supplemental O2 delivered via (comment) Behavior/Cognition: Alert;Confused;Distractible;Requires cueing;Doesn't follow directions;Decreased sustained attention Oral Cavity - Dentition: Dentures, top Patient Positioning: Upright in bed  Oral Cavity - Oral Hygiene Does patient have any of the following "at risk" factors?: Oxygen therapy - cannula, mask, simple oxygen  devices Brush patient's teeth BID with toothbrush (using toothpaste with fluoride): Yes Patient is HIGH RISK - Oral Care Protocol followed (see row info): Yes Patient is AT RISK - Oral Care Protocol followed (see row info): Yes   Dysphagia Treatment Treatment focused on: Upgraded PO texture trials;Patient/family/caregiver education Family/Caregiver Educated: Niece Treatment Methods/Modalities: Skilled observation;Differential diagnosis Patient observed directly with PO's: Yes Type of PO's observed: Dysphagia 1 (puree);Thin liquids;Ice chips Feeding: Total assist Liquids provided via: Teaspoon Oral Phase Signs & Symptoms:  (Oral holding) Type of cueing: Verbal;Tactile Amount of cueing: Maximal   GO     Maryjo Rochester T 07/12/2013, 4:03 PM

## 2013-07-12 NOTE — Evaluation (Signed)
Occupational Therapy Evaluation Patient Details Name: Regina [name] MRN: [id number] DOB: 1941-08-12 Today's Date: 07/12/2013 Time: 4098-1191 OT Time Calculation (min): 38 min  OT Assessment / Plan / Recommendation History of present illness 72 y.o. female has had a complicated medical course over the last 1 month. Patient is unable to provide history due to weakness and? Dementia. History is provided by patient's daughter [name] who is at the bedside. 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 She was recently admitted twice at [facility]-06/04/13-06/06/13 (DKA) & 06/07/13-06/09/13 (metabolic encephalopathy, uncontrolled DM and CVA). Following the last discharge, patient was sent to rehabilitation where she was for approximately 3 weeks. Patient has had a sustained rapid decline at SNF. 2 days after going to SNF, she developed profuse diarrhea of 9 days duration, extremely poor appetite, lethargy, laying in bed and unwilling to participate in therapies, decreased urine output and progressive generalized weakness.    Clinical Impression   Pt able to participate some in session but is very deconditioned and weak. Pt's sister present for session and initiated playing music for pt and pt did respond with moving bilateral UE. Feel she can benefit from skilled OT services to maximize strength and independence with self care tasks for next venue.     OT Assessment  Patient needs continued OT Services    Follow Up Recommendations  SNF;Supervision/Assistance - 24 hour    Barriers to Discharge      Equipment Recommendations  3 in 1 bedside comode    Recommendations for Other Services    Frequency  Min 2X/week    Precautions / Restrictions Precautions Precautions: Fall Restrictions Weight Bearing Restrictions: No   Pertinent Vitals/Pain Pt didn't indicate any pain with movement.    ADL  Eating/Feeding: NPO (feeding tube) Grooming: Performed;Wash/dry face;+1 Total assistance (hand over hand) pt 10% Where Assessed - Grooming: Supine, head of bed up Upper Body Bathing: Simulated;Chest;Left arm;Abdomen;Right arm;+1 Total assistance Where Assessed - Upper Body Bathing: Supine, head of bed up Lower Body Bathing: Simulated;+1 Total assistance (did not attempt rolling today) Where Assessed - Lower Body Bathing: Supine, head of bed up Upper Body Dressing: Simulated;+1 Total assistance Where Assessed - Upper Body Dressing: Supine, head of bed up Lower Body Dressing: Simulated;+1 Total assistance Where Assessed - Lower Body Dressing: Supine, head of bed up ADL Comments: Worked on UE exercises to help with strengthening and edema control. Elevated UEs on pillows once finished with exercises and grooming task. Pt able to help about 10% with washing her face with hand over hand assist. Pt's sister played music during session and pt began moving UEs to the music whereas she hadnt initiated movement to command earlier in session.     OT Diagnosis: Generalized weakness  OT Problem List: Decreased strength;Decreased range of motion;Decreased cognition;Decreased knowledge of use of DME or AE OT Treatment Interventions: Self-care/ADL training;Therapeutic exercise;DME and/or AE instruction;Therapeutic activities;Patient/family education   OT Goals(Current goals can be found in the care plan section) Acute Rehab OT Goals Patient Stated Goal: none specified OT Goal Formulation: Patient unable to participate in goal setting Time For Goal Achievement: 07/26/13 Potential to Achieve Goals: Good  Visit Information  Last OT Received On: 07/12/13 Assistance Needed: +2 History of Present Illness: 72 y.o. female has had a complicated medical course over the last 1 month. Patient is unable to provide history due to weakness and? Dementia. History is provided by patient's daughter  Ms. Shawna Orleans who is at  the bedside. 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 She was recently admitted twice at [facility]-06/04/13-06/06/13 (DKA) & 06/07/13-06/09/13 (metabolic encephalopathy, uncontrolled DM and CVA). Following the last discharge, patient was sent to rehabilitation where she was for approximately 3 weeks. Patient has had a sustained rapid decline at SNF. 2 days after going to SNF, she developed profuse diarrhea of 9 days duration, extremely poor appetite, lethargy, laying in bed and unwilling to participate in therapies, decreased urine output and progressive generalized weakness.        Prior Functioning     Home Living Family/patient expects to be discharged to:: Skilled nursing facility Living Arrangements: Other (Comment) (SNF) Available Help at Discharge: Family;Available PRN/intermittently Type of Home: House Home Access: Stairs to enter [employer] of Steps: 1 Entrance Stairs-Rails: None Home Layout: One level Home Equipment: Cane - single point;Walker - standard Prior Function Level of Independence: Independent with assistive device(s) Communication Communication: Other (comment) (Pt lethargic upon entering room, one word reponses) Dominant Hand: Right         Vision/Perception Vision - History Visual History: Macular degeneration   Cognition  Cognition Arousal/Alertness: Lethargic Overall Cognitive Status: Impaired/Different from baseline Area of Impairment: Following commands;Orientation Orientation Level: Disoriented to;Place;Time;Situation Following Commands: Follows one step commands inconsistently General Comments: pt at times can follow a simple command    Extremity/Trunk Assessment Upper Extremity Assessment Upper Extremity Assessment: RUE deficits/detail;LUE deficits/detail RUE Deficits / Details: AAROM of shoulder to approximately 120 degrees  comfortably. Elbow flexion/extension with resistance during AAROM. At times pt moving R UE to music played by sister but not to command consistently. Note edema in hand. PROM to digits LUE Deficits / Details: AAROM to shoulder, elbow,wrist and hand. Pt didnt move UE to command but did start to move UE to music. Note resistance especially with elbow extension.      Mobility       Exercise Other Exercises Other Exercises: AAROM shoulder flexion X 10, elbow flexion/extension X 10, wrist flexion/extension X 10. (all active assistive)   Balance     End of Session OT - End of Session Activity Tolerance: Patient limited by lethargy Patient left: in bed;with call bell/phone within reach;with family/visitor present  GO     [name] [phone] 07/12/2013, 11:25 AM

## 2013-07-13 ENCOUNTER — Inpatient Hospital Stay (HOSPITAL_COMMUNITY): Payer: Medicare Other

## 2013-07-13 ENCOUNTER — Encounter: Payer: Self-pay | Admitting: Internal Medicine

## 2013-07-13 DIAGNOSIS — R5381 Other malaise: Secondary | ICD-10-CM

## 2013-07-13 LAB — RENAL FUNCTION PANEL
Albumin: 2.1 g/dL — ABNORMAL LOW (ref 3.5–5.2)
Chloride: 106 mEq/L (ref 96–112)
GFR calc Af Amer: >90 mL/min (ref >90)
GFR calc non Af Amer: 90 mL/min — ABNORMAL LOW (ref >90)
Phosphorus: 4 mg/dL (ref 2.3–4.6)
Potassium: 4.4 mEq/L (ref 3.5–5.1)
Sodium: 138 mEq/L (ref 135–145)

## 2013-07-13 LAB — GLUCOSE, CAPILLARY
Glucose-Capillary: 158 mg/dL — ABNORMAL HIGH (ref 70–99)
Glucose-Capillary: 176 mg/dL — ABNORMAL HIGH (ref 70–99)
Glucose-Capillary: 181 mg/dL — ABNORMAL HIGH (ref 70–99)

## 2013-07-13 LAB — HIV ANTIBODY (ROUTINE TESTING W REFLEX): HIV: NONREACTIVE

## 2013-07-13 LAB — HEPATITIS C ANTIBODY (REFLEX): HCV Ab: NEGATIVE

## 2013-07-13 MED ORDER — GADOBENATE DIMEGLUMINE 529 MG/ML IV SOLN
20.0000 mL | Freq: Once | INTRAVENOUS | Status: AC | PRN
  Administered 2013-07-13: 20 mL via INTRAVENOUS

## 2013-07-13 NOTE — Progress Notes (Signed)
Subjective: Continues to be confused. He occasionally has some odd movements of her right leg, lifting and up and shaking it is a little bit, but not clearly rhythmic  Exam: Filed Vitals:   07/13/13 1407  BP: 153/79  Pulse: 94  Temp: 97.9 F (36.6 C)  Resp: 18   Gen: In bed, NAD MS: Able to tell me her name, but unable to answer any further questions. She follows some simple commands, but no more complex commands. She is able to identify noxious stimulation, but not able to tell me where I am providing it. CN: Pupils equal and reactive, clearly has right-sided gaze preference, blinks to threat bilaterally in the right eye, face with downturn of the right mouth which her daughter says is long-standing. Motor: She moves all extremities to noxious stimuli, is able to hold her ankle out of bed on the left, on the right it is unclear she can do this but she does not cooperate with exam. Sensory: response to noxious stimuli in all 4 extremities   Impression: 72 year old female with persistent altered mental status that started shortly before addition to the hospital. Per the daughter, she has not had any history of dementia or previous episodes of altered mental status. Her stroke in August was relatively small, and I'm not certain that explains her current mental status.  LP and MRI have been negative, however with her gaze preference I would favor repeating her MRI at this time.  Also possible, is that she still does have a metabolic encephalopathy is multifactorial secondary to infection and hospitalization.  Recommendations: 1) MRI brain 2) if MRI without evidence of lesion, it may be reasonable to transfer her to [facility] in for continuous EEG monitoring to see if she is having intermittent seizures.  Ritta Slot, MD [facility] [phone]  If 7pm- 7am, please page neurology on call at [phone].

## 2013-07-13 NOTE — Progress Notes (Signed)
[facility] PROGRESS NOTE  Regina [name] NWG:[id number] DOB: 12-29-1940 DOA: 07/01/2013 PCP: Michiel Sites, MD Brief HPI: 72 yo female admitted with generalized weakness, and found to have UTI, diarrhea and acute renal failure. Found to have C diff. 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  Assessment/Plan: #1 acute encephalopathy Questionable etiology. Clinical improvement. Patient alert,but not following commands. May be secondary to seizures in the setting of CVA. Per nursing patient with right upper extremity jerking motions occasionally.  CT of the head was negative. MRI of the head is negative for any acute infarct. EEG is negative for any epileptiform discharges. Patient was hypokalemic and potassium repleted.  Potassium today is 4.1. Keep potassium greater than 4.  Placed on Ativan 1 mg IV every 2 hours when necessary. Neuro checks every 4 hours. S/P lumbar puncture, however had been on antibiotic prior to LP. CSF cultures neg. CSF HSV cultures pending.  Aggrenox and Lovenox which have been discontinued. Repeat blood cultures pending. Replete electrolytes.  Was empirically on IV vancomycin, Rocephin, acyclovir. Continue IV Keppra. Neurology and ID following and appreciate input and recommendations.  #2 probable seizure Patient noted to have jerking motion of the right upper extremity mostly throughout the night  5 days ago in addition to altered mental status. Patient's eyes rolled and deviated to the right. Patient also noted to have a fever. CT of the head was negative. MRI of the head was negative. EEG was negative. Significant improvement in her jerking in the right upper extremity. Patient was loaded with IV Keppra and is on Keppra IV every 12 hours. Cultures are pending. On empiric IV vancomycin, Rocephin, acyclovir  for possible meningitis, which have been stopped by ID on 9/30. Neuro checks. Neurology following and appreciate input and recommendations. MRI of the L/S spine  pending and wer have palliative care consult for goals of care requested today.   #3 possible meningitis/CSF infection  Patient noted to have a rectal temperature 102.9  5 days ago. Fever curve trending down. Patient had significant nuchal rigidity. Patient with altered mental status, but improving daily. Blood cultures are pending. Patient s/p LP .  MRI head negative. EEG negative. Culture from CSF neg. HSV CSF cultures pending, patient however was on empiric IV antibiotics for 2-3 days prior to LP. Continue to hold Aggrenox and Lovenox for now as platelets at 62. Mental status  improving. Neurology following and appreciate input and recommendations. Ampicillin and decadron d/c'd per ID. ID and neuro FF and appreciate input and rxcs. Per ID stop vanc, rocephin, acyclovir today.  #4 acute renal failure Likely secondary to volume depletion and hypotension in the setting of ARB. Clinical improvement. Renal function with creatinine now of 0.68. Good urine output with 2400 cc over the past 24 hours. Continue Foley catheter. Bicarbonate drip has been discontinued as acidosis has resolved. Continue to hold ARB and metformin.   #5 hypokalemia/hypomagnesemia Likely secondary to GI losses from C. difficile colitis and increased urinary output. Replete.Keep magnesium at 2.   #6 C. difficile colitis Continue oral vancomycin to per panda. Follow.  #7 hypernatremia Sodium levels improved and now at 139. Decrease IVF. follow.   #8 Escherichia coli UTI Patient had received IV Rocephin x3 days.   #9 hypertension D/C IV Lopressor. Resume home dose coreg and norvasc 5 mg daily.  #10 history of CVA Aggrenox held for lumbar puncture 2 days ago. Patient alert, however platelets 62. Will defer to neurology, when to resume.  #11 diabetes  mellitus Hemoglobin A1c is 9.6. CBGs have ranged from 114-161. Decadron d/c'd. Continue Levemir 45 units subcutaneous twice a day. Continue sliding scale insulin. CBG (last 3)    Recent Labs  07/13/13 0424 07/13/13 0757 07/13/13 1240  GLUCAP 158* 176* 181*     #12 hypothyroidism Continue Synthroid and Cytomel.  #13 anxiety/depression Stable.  #14 Nuitrition Placed panda tube 9/29. On TF. SLP for eval.Patient with diarrhea which started after TF.   #15. Thrombocytopenia Likely secondary to infection. No bleeding noted. Hold lovenox. Follow.  #16 prophylaxis  Pepcid for GI prophylaxis. SCDs for DVT prophylaxis.   Code Status: DNR Family Communication: none at bedside.  Disposition Plan: pending palliative care consult   Consultants:  Nephrology: Dr. Eliott Nine 07/02/2013:  Neurology : Dr Thad Ranger 07/06/13  ID: Dr Luciana Axe 07/09/13  Procedures: MRI brain 8/27 >> small acute lacunar infarct in medial Rt thalamus, ? Punctate acute lacunar infarct central pons CT head 07/05/2013  KUB 07/05/2013  KUB 07/01/2013  Chest x-ray 07/01/2013  MRI head 07/06/13  EEG 07/06/13  LP 07/07/13  PICC line 07/09/13  Antibiotics:  IV Rocephin 07/01/2013--> 07/05/2013  IV Flagyl 07/04/2013-->07/10/13  Oral flagyl 07/10/13-9/30  IV vancomycin 07/06/2013-9/30  IV Rocephin 07/06/2013- 9/30  IV ampicillin 07/06/2013--->07/09/13  IV Acyclovir 07/06/13- 9/30  Oral vancomycin 9/30  HPI/Subjective: Patient is alert, not following  commands.  RUE jerking motions  Objective: Filed Vitals:   07/13/13 0425  BP: 150/76  Pulse: 81  Temp: 98 F (36.7 C)  Resp: 20    Intake/Output Summary (Last 24 hours) at 07/13/13 1305 Last data filed at 07/13/13 1610  Gross per 24 hour  Intake   4865 ml  Output   1725 ml  Net   3140 ml   Filed Weights   07/11/13 1650 07/12/13 0441 07/13/13 0425  Weight: 102.694 kg (226 lb 6.4 oz) 103.012 kg (227 lb 1.6 oz) 103.4 kg (227 lb 15.3 oz)    Exam:   General:  Eyes open. Comfortable.   Cardiovascular: RRR  Respiratory: CTAB anterior lung fields.  Abdomen: Soft/NT/ND/+BS  Musculoskeletal: No c/c/e Neuro:  Patient is alert. But not following commands ,  Data Reviewed: Basic Metabolic Panel:  Recent Labs Lab 07/08/13 0340  07/09/13 0340 07/10/13 0515 07/11/13 0630 07/12/13 0505 07/13/13 0444  NA 153*  < > 144 136 139 140 138  K 3.8  < > 3.6 3.0* 4.1 4.2 4.4  CL 109  < > 101 97 105 106 106  CO2 33*  < > 34* 31 27 26 27   GLUCOSE 365*  < > 261* 202* 106* 250* 181*  BUN 18  < > 14 15 14 11 11   CREATININE 0.94  < > 0.82 0.71 0.68 0.64 0.61  CALCIUM 7.5*  < > 7.5* 7.2* 7.8* 8.1* 8.3*  MG 1.2*  --  1.5 1.5 1.6 1.7  --   PHOS 2.8  2.7  --  2.1* 2.3  2.3 3.6 3.6 4.0  < > = values in this interval not displayed. Liver Function Tests:  Recent Labs Lab 07/09/13 0340 07/10/13 0515 07/11/13 0630 07/12/13 0505 07/13/13 0444  ALBUMIN 2.6* 2.2* 2.2* 2.2* 2.1*   No results found for this basename: LIPASE, AMYLASE,  in the last 168 hours No results found for this basename: AMMONIA,  in the last 168 hours CBC:  Recent Labs Lab 07/07/13 0335 07/08/13 0340 07/09/13 0340 07/10/13 0515 07/11/13 0630 07/12/13 0505  WBC 4.6 5.0 6.4 7.5 5.7 6.2  NEUTROABS 3.2  --   --   --  3.5  --   HGB 12.3 12.4 12.6 12.0 11.2* 10.6*  HCT 37.0 37.4 38.2 35.2* 33.6* 32.2*  MCV 93.2 94.0 92.9 90.7 92.3 93.6  PLT 164 132* 112* 73* 62* 59*   Cardiac Enzymes: No results found for this basename: CKTOTAL, CKMB, CKMBINDEX, TROPONINI,  in the last 168 hours BNP (last 3 results) No results found for this basename: PROBNP,  in the last 8760 hours CBG:  Recent Labs Lab 07/12/13 2048 07/13/13 0104 07/13/13 0424 07/13/13 0757 07/13/13 1240  GLUCAP 170* 168* 158* 176* 181*    Recent Results (from the past 240 hour(s))  CULTURE, BLOOD (ROUTINE X 2)     Status: None   Collection Time    07/05/13 11:20 PM      Result Value Range Status   Specimen Description BLOOD LEFT HAND   Final   Special Requests BOTTLES DRAWN AEROBIC AND ANAEROBIC 3CC   Final   Culture  Setup Time     Final   Value: 07/06/2013  04:42     Performed at [facility]   Culture     Final   Value: NO GROWTH 5 DAYS     Performed at [facility]   Report Status 07/12/2013 FINAL   Final  CULTURE, BLOOD (ROUTINE X 2)     Status: None   Collection Time    07/05/13 11:24 PM      Result Value Range Status   Specimen Description BLOOD RIGHT ANTECUBITAL   Final   Special Requests BOTTLES DRAWN AEROBIC AND ANAEROBIC 5CC   Final   Culture  Setup Time     Final   Value: 07/06/2013 04:42     Performed at [facility]   Culture     Final   Value: NO GROWTH 5 DAYS     Performed at [facility]   Report Status 07/12/2013 FINAL   Final  URINE CULTURE     Status: None   Collection Time    07/06/13  8:01 AM      Result Value Range Status   Specimen Description URINE, CATHETERIZED   Final   Special Requests NONE   Final   Culture  Setup Time     Final   Value: 07/06/2013 10:49     Performed at [facility] Count     Final   Value: 40,000 COLONIES/ML     Performed at [facility]   Culture     Final   Value: YEAST     Performed at [facility]   Report Status 07/07/2013 FINAL   Final  CULTURE, BLOOD (SINGLE)     Status: None   Collection Time    07/06/13  8:45 AM      Result Value Range Status   Specimen Description BLOOD RIGHT ARM   Final   Special Requests BOTTLES DRAWN AEROBIC ONLY 3CC    Final   Culture  Setup Time     Final   Value: 07/06/2013 10:49     Performed at [facility]   Culture     Final   Value: NO GROWTH 5 DAYS     Performed at [facility]   Report Status 07/12/2013 FINAL   Final  MRSA PCR SCREENING     Status: None   Collection Time    07/06/13  9:25 AM      Result Value Range Status   MRSA by PCR  NEGATIVE  NEGATIVE Final   Comment:            The GeneXpert MRSA Assay (FDA     approved for NASAL specimens     only), is one component of a     comprehensive MRSA colonization     surveillance program. It is not      intended to diagnose MRSA     infection nor to guide or     monitor treatment for     MRSA infections.  GRAM STAIN     Status: None   Collection Time    07/07/13  5:40 PM      Result Value Range Status   Specimen Description CSF CSF   Final   Special Requests NONE   Final   Gram Stain     Final   Value: NO ORGANISMS SEEN     NO WBC SEEN     Gram Stain Report Called to,Read Back By and Verified With: AYERS,C. AT 1958 ON 09.26.14 BY LOVE,T.   Report Status 07/07/2013 FINAL   Final  BODY FLUID CULTURE     Status: None   Collection Time    07/07/13  5:42 PM      Result Value Range Status   Specimen Description Body Fluid   Final   Special Requests NONE   Final   Gram Stain     Final   Value: NO WBC SEEN     NO ORGANISMS SEEN     Performed at [facility]   Culture     Final   Value: NO GROWTH 3 DAYS     Performed at [facility]   Report Status 07/11/2013 FINAL   Final     Studies: No results found.  Scheduled Meds: . amLODipine  10 mg Per NG tube Daily  . antiseptic oral rinse  15 mL Mouth Rinse q12n4p  . carvedilol  12.5 mg Per NG tube BID WC  . chlorhexidine  15 mL Mouth Rinse BID  . famotidine (PEPCID) IV  20 mg Intravenous Daily  . insulin aspart  0-20 Units Subcutaneous Q4H  . insulin detemir  50 Units Subcutaneous BID  . latanoprost  1 drop Both Eyes QHS  . levETIRAcetam  500 mg Intravenous Q12H  . levothyroxine  75 mcg Per NG tube QAC breakfast  . liothyronine  25 mcg Per NG tube BID  . saccharomyces boulardii  250 mg Oral BID  . sodium chloride  10-40 mL Intracatheter Q12H  . sodium chloride  3 mL Intravenous Q12H  . vancomycin  125 mg Per Tube Q6H   Continuous Infusions: . feeding supplement (JEVITY 1.2 CAL) 1,000 mL (07/13/13 0156)  . sodium chloride 0.9 % 1,000 mL with potassium chloride 20 mEq infusion 100 mL/hr at 07/13/13 1242    Principal Problem:   Acute encephalopathy Active Problems:   HYPOTHYROIDISM   DIABETES MELLITUS,  TYPE II   HYPERLIPIDEMIA   UTI (urinary tract infection)   HTN (hypertension)   Anxiety and depression   CVA (cerebral infarction)   Weakness generalized   Dehydration   Acute renal failure   Failure to thrive   Diarrhea   Hyponatremia   Metabolic acidosis   Clostridium difficile colitis   Hypomagnesemia   Hypernatremia   Seizures   Meningitis, unspecified(322.9)   Protein-calorie malnutrition, severe   Thrombocytopenia, unspecified    Time spent: > 35 mins    Capers Hagmann  [facility] Pager [phone] If 7PM-7AM,  please contact night-coverage at [email or web address], password [facility] 07/13/2013, 1:05 PM  LOS: 12 days

## 2013-07-13 NOTE — Progress Notes (Signed)
NUTRITION FOLLOW UP  Intervention:   Continue Jevity 1.2 at 60 ml/hr. Goal regimen provides: 1728 kcal, 80 grams, 1162 ml free water. Pt will require additional free water once IVF discontinued. Diet per SLP. RD to continue to follow nutrition care plan.  Nutrition Dx:   Inadequate oral intake related to poor appetite as evidenced by ongoing weight loss and poor meal completion; unmet  Goal:   Pt to meet >/= 90% of their estimated nutrition needs; met  Monitor:   Weight, labs, I/O's, toleration of TF  Assessment:   PMHx significant for DM2, retinopathy, legal blindness, HTN, CVA, hypothyroidism, anxiety and depression. Noted pt with 2 recent admissions to [facility] within the past month. Pt was d/c'd to rehab for about 3 weeks after hospitalization. Pt developed diarrhea, poor appetite, and overall decline x 9 days at [facility]. Family thought decline was 2/2 lack of care at SNF, pt was then sent home. Remains weak and bed bound. Reported weight loss of 25 lb x 1 month.  Pt found positive for C. Difficile on 9/23. Pt transferred to ICU on 9/25 for fever and possible seizure activity. Per neuro, pt continues to improve.  Pt seen by SLP today, with recommendations for NPO. Pt is inappropriate for PO trials or objective study. RN reports that pt does not follow commands and has minimal verbalizations.  Presently with panda tube (tip is likely in the first portion of the duodenum, per radiology report.) Currently ordered for Jevity 1.2 at 60 ml/hr, this provides: 1728 kcal, 80 grams, 1162 ml free water. Discussed during progression rounds, per MD, likely to place palliative care consult today.  Also receiving NS with KCl 40 mEq infusion at 100 ml/hr.  Pt meets criteria for severe MALNUTRITION in the context of chronic illness as evidenced by intake of <75% x at least 1 month and wt loss of >5% x 1 month.  Height: Ht Readings from Last 1 Encounters:  07/11/13 5\' 2"  (1.575 m)    Weight Status:    Wt Readings from Last 1 Encounters:  07/13/13 227 lb 15.3 oz (103.4 kg)  220 lb admit wt  Re-estimated needs:  Kcal: 1550-1750 Protein: 80-95 g Fluid: Per MD  Skin: WNL  Diet Order: NPO   Intake/Output Summary (Last 24 hours) at 07/13/13 0928 Last data filed at 07/13/13 1610  Gross per 24 hour  Intake   4965 ml  Output   1725 ml  Net   3240 ml    Last BM: 10/1 via rectal tube   Labs:   Recent Labs Lab 07/10/13 0515 07/11/13 0630 07/12/13 0505 07/13/13 0444  NA 136 139 140 138  K 3.0* 4.1 4.2 4.4  CL 97 105 106 106  CO2 31 27 26 27   BUN 15 14 11 11   CREATININE 0.71 0.68 0.64 0.61  CALCIUM 7.2* 7.8* 8.1* 8.3*  MG 1.5 1.6 1.7  --   PHOS 2.3  2.3 3.6 3.6 4.0  GLUCOSE 202* 106* 250* 181*    CBG (last 3)   Recent Labs  07/12/13 2048 07/13/13 0104 07/13/13 0424  GLUCAP 170* 168* 158*    Scheduled Meds: . amLODipine  10 mg Per NG tube Daily  . antiseptic oral rinse  15 mL Mouth Rinse q12n4p  . carvedilol  12.5 mg Per NG tube BID WC  . chlorhexidine  15 mL Mouth Rinse BID  . famotidine (PEPCID) IV  20 mg Intravenous Daily  . insulin aspart  0-20 Units Subcutaneous Q4H  .  insulin detemir  50 Units Subcutaneous BID  . latanoprost  1 drop Both Eyes QHS  . levETIRAcetam  500 mg Intravenous Q12H  . levothyroxine  75 mcg Per NG tube QAC breakfast  . liothyronine  25 mcg Per NG tube BID  . saccharomyces boulardii  250 mg Oral BID  . sodium chloride  10-40 mL Intracatheter Q12H  . sodium chloride  3 mL Intravenous Q12H  . vancomycin  125 mg Per Tube Q6H    Continuous Infusions: . feeding supplement (JEVITY 1.2 CAL) 1,000 mL (07/13/13 0156)  . sodium chloride 0.9 % 1,000 mL with potassium chloride 20 mEq infusion 100 mL/hr at 07/12/13 2304    Jarold Motto MS, RD, LDN Pager: [phone] After-hours pager: [phone]

## 2013-07-13 NOTE — Progress Notes (Signed)
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

## 2013-07-13 NOTE — Progress Notes (Addendum)
[facility]   Day #2 NG vancomycin (9 days of flagyl prior to this)   Subjective: Not able to answer questions   Antibiotics:  Anti-infectives   Start     Dose/Rate Route Frequency Ordered Stop   07/11/13 1800  vancomycin (VANCOCIN) 50 mg/mL oral solution 125 mg     125 mg Per Tube 4 times per day 07/11/13 1526     07/10/13 1100  metroNIDAZOLE (FLAGYL) tablet 500 mg  Status:  Discontinued     500 mg Per Tube 3 times per day 07/10/13 1038 07/11/13 1526   07/10/13 1000  metroNIDAZOLE (FLAGYL) 50 mg/ml oral suspension 500 mg  Status:  Discontinued     500 mg Per Tube 3 times daily 07/10/13 0901 07/10/13 1038   07/08/13 1400  acyclovir (ZOVIRAX) 500 mg in dextrose 5 % 100 mL IVPB  Status:  Discontinued     500 mg 110 mL/hr over 60 Minutes Intravenous 3 times per day 07/08/13 1128 07/12/13 1459   07/07/13 1000  vancomycin (VANCOCIN) 1,500 mg in sodium chloride 0.9 % 500 mL IVPB  Status:  Discontinued     1,500 mg 250 mL/hr over 120 Minutes Intravenous Every 24 hours 07/06/13 0912 07/06/13 1508   07/07/13 1000  vancomycin (VANCOCIN) 1,250 mg in sodium chloride 0.9 % 250 mL IVPB  Status:  Discontinued     1,250 mg 166.7 mL/hr over 90 Minutes Intravenous Every 24 hours 07/06/13 1508 07/07/13 0834   07/07/13 1000  vancomycin (VANCOCIN) IVPB 750 mg/150 ml premix  Status:  Discontinued     750 mg 150 mL/hr over 60 Minutes Intravenous Every 12 hours 07/07/13 0835 07/12/13 1459   07/06/13 1530  acyclovir (ZOVIRAX) 500 mg in dextrose 5 % 100 mL IVPB  Status:  Discontinued     500 mg 110 mL/hr over 60 Minutes Intravenous Every 12 hours 07/06/13 1500 07/08/13 1128   07/06/13 1000  cefTRIAXone (ROCEPHIN) 2 g in dextrose 5 % 50 mL IVPB  Status:  Discontinued     2 g 100 mL/hr over 30 Minutes Intravenous Every 12 hours 07/06/13 0800 07/12/13 1459   07/06/13 0900  ampicillin (OMNIPEN) 2 g in sodium chloride 0.9 % 50 mL IVPB  Status:  Discontinued     2 g 150 mL/hr  over 20 Minutes Intravenous Every 4 hours 07/06/13 0807 07/09/13 1108   07/06/13 0830  ampicillin (OMNIPEN) 2 g in sodium chloride 0.9 % 50 mL IVPB  Status:  Discontinued     2 g 150 mL/hr over 20 Minutes Intravenous 6 times per day 07/06/13 0800 07/06/13 0807   07/06/13 0830  vancomycin (VANCOCIN) 2,000 mg in sodium chloride 0.9 % 500 mL IVPB     2,000 mg 250 mL/hr over 120 Minutes Intravenous STAT 07/06/13 0822 07/06/13 1048   07/04/13 0600  metroNIDAZOLE (FLAGYL) IVPB 500 mg  Status:  Discontinued     500 mg 100 mL/hr over 60 Minutes Intravenous Every 8 hours 07/04/13 0547 07/10/13 0901   07/01/13 1415  cefTRIAXone (ROCEPHIN) 1 g in dextrose 5 % 50 mL IVPB  Status:  Discontinued     1 g 100 mL/hr over 30 Minutes Intravenous Every 24 hours 07/01/13 1408 07/05/13 1548      Medications: Scheduled Meds: . amLODipine  10 mg Per NG tube Daily  . antiseptic oral rinse  15 mL Mouth Rinse q12n4p  . carvedilol  12.5 mg Per NG tube BID WC  . chlorhexidine  15 mL Mouth Rinse BID  . famotidine (PEPCID) IV  20 mg Intravenous Daily  . insulin aspart  0-20 Units Subcutaneous Q4H  . insulin detemir  50 Units Subcutaneous BID  . latanoprost  1 drop Both Eyes QHS  . levETIRAcetam  500 mg Intravenous Q12H  . levothyroxine  75 mcg Per NG tube QAC breakfast  . liothyronine  25 mcg Per NG tube BID  . saccharomyces boulardii  250 mg Oral BID  . sodium chloride  10-40 mL Intracatheter Q12H  . sodium chloride  3 mL Intravenous Q12H  . vancomycin  125 mg Per Tube Q6H   Continuous Infusions: . feeding supplement (JEVITY 1.2 CAL) 1,000 mL (07/13/13 0156)  . sodium chloride 0.9 % 1,000 mL with potassium chloride 20 mEq infusion 100 mL/hr at 07/13/13 1242   PRN Meds:.acetaminophen, acetaminophen, albuterol, hydrALAZINE, LORazepam, ondansetron (ZOFRAN) IV, promethazine, sodium chloride   Objective: Weight change: 1 lb 8.9 oz (0.706 kg)  Intake/Output Summary (Last 24 hours) at 07/13/13 1509 Last data  filed at 07/13/13 1409  Gross per 24 hour  Intake   1945 ml  Output   1925 ml  Net     20 ml   Blood pressure 153/79, pulse 94, temperature 97.9 F (36.6 C), temperature source Axillary, resp. rate 18, height 5\' 2"  (1.575 m), weight 227 lb 15.3 oz (103.4 kg), SpO2 99.00%. Temp:  [97.9 F (36.6 C)-98.5 F (36.9 C)] 97.9 F (36.6 C) (10/02 1407) Pulse Rate:  [81-94] 94 (10/02 1407) Resp:  [18-20] 18 (10/02 1407) BP: (148-153)/(60-79) 153/79 mmHg (10/02 1407) SpO2:  [99 %-100 %] 99 % (10/02 1407) Weight:  [227 lb 15.3 oz (103.4 kg)] 227 lb 15.3 oz (103.4 kg) (10/02 0425)  Physical Exam: General: awake, NG tube in place not following commands HEENT: anicteric sclera, EOMI CVS regular rate, normal r,  no murmur rubs or gallops Chest: clear to auscultation bilaterally, no wheezing, rales or rhonchi Abdomen: soft nontender, nondistended, normal bowel sounds, Extremities: no  clubbing or edema noted bilaterally Skin: no rashes  Neuro: left leg, foot moves less in response to noxious stimuli vs right which is hyperactive  Lab Results:  Recent Labs  07/11/13 0630 07/12/13 0505  WBC 5.7 6.2  HGB 11.2* 10.6*  HCT 33.6* 32.2*  PLT 62* 59*    BMET  Recent Labs  07/12/13 0505 07/13/13 0444  NA 140 138  K 4.2 4.4  CL 106 106  CO2 26 27  GLUCOSE 250* 181*  BUN 11 11  CREATININE 0.64 0.61  CALCIUM 8.1* 8.3*    Micro Results: Recent Results (from the past 240 hour(s))  CULTURE, BLOOD (ROUTINE X 2)     Status: None   Collection Time    07/05/13 11:20 PM      Result Value Range Status   Specimen Description BLOOD LEFT HAND   Final   Special Requests BOTTLES DRAWN AEROBIC AND ANAEROBIC 3CC   Final   Culture  Setup Time     Final   Value: 07/06/2013 04:42     Performed at [facility]   Culture     Final   Value: NO GROWTH 5 DAYS     Performed at [facility]   Report Status 07/12/2013 FINAL   Final  CULTURE, BLOOD (ROUTINE X 2)     Status: None    Collection Time    07/05/13 11:24 PM      Result Value Range Status   Specimen Description  BLOOD RIGHT ANTECUBITAL   Final   Special Requests BOTTLES DRAWN AEROBIC AND ANAEROBIC 5CC   Final   Culture  Setup Time     Final   Value: 07/06/2013 04:42     Performed at [facility]   Culture     Final   Value: NO GROWTH 5 DAYS     Performed at [facility]   Report Status 07/12/2013 FINAL   Final  URINE CULTURE     Status: None   Collection Time    07/06/13  8:01 AM      Result Value Range Status   Specimen Description URINE, CATHETERIZED   Final   Special Requests NONE   Final   Culture  Setup Time     Final   Value: 07/06/2013 10:49     Performed at [facility] Count     Final   Value: 40,000 COLONIES/ML     Performed at [facility]   Culture     Final   Value: YEAST     Performed at [facility]   Report Status 07/07/2013 FINAL   Final  CULTURE, BLOOD (SINGLE)     Status: None   Collection Time    07/06/13  8:45 AM      Result Value Range Status   Specimen Description BLOOD RIGHT ARM   Final   Special Requests BOTTLES DRAWN AEROBIC ONLY 3CC    Final   Culture  Setup Time     Final   Value: 07/06/2013 10:49     Performed at [facility]   Culture     Final   Value: NO GROWTH 5 DAYS     Performed at [facility]   Report Status 07/12/2013 FINAL   Final  MRSA PCR SCREENING     Status: None   Collection Time    07/06/13  9:25 AM      Result Value Range Status   MRSA by PCR NEGATIVE  NEGATIVE Final   Comment:            The GeneXpert MRSA Assay (FDA     approved for NASAL specimens     only), is one component of a     comprehensive MRSA colonization     surveillance program. It is not     intended to diagnose MRSA     infection nor to guide or     monitor treatment for     MRSA infections.  GRAM STAIN     Status: None   Collection Time    07/07/13  5:40 PM      Result Value Range Status   Specimen  Description CSF CSF   Final   Special Requests NONE   Final   Gram Stain     Final   Value: NO ORGANISMS SEEN     NO WBC SEEN     Gram Stain Report Called to,Read Back By and Verified With: AYERS,C. AT 1958 ON 09.26.14 BY LOVE,T.   Report Status 07/07/2013 FINAL   Final  BODY FLUID CULTURE     Status: None   Collection Time    07/07/13  5:42 PM      Result Value Range Status   Specimen Description Body Fluid   Final   Special Requests NONE   Final   Gram Stain     Final   Value: NO WBC SEEN     NO  ORGANISMS SEEN     Performed at [facility]   Culture     Final   Value: NO GROWTH 3 DAYS     Performed at [facility]   Report Status 07/11/2013 FINAL   Final    Studies/Results: No results found.    Assessment/Plan: Regina [name] is a 72 y.o. female with  Admission with obtundation, diarrhea, C difficile colitis (positive on GI pathogen panel) also rx with IV abx for possible meningoencephalitis. LP on abx was bland and MRI brani unremarkable. She is still obtunded and with apparentl left sided weakness  #1 C difficile colitis: --continue vancomycin per NG for another 13 days --all other abx dcd at present --avoid ppi, h2 blockers (I DC D PEPCID, I dont think she will be able to voice GERD ssx at present and she does not meet criteria for SUP as she is not in ICU)  #2 Left sided weakness: ordered MRI L spine to ensure no injury or infection there  #3 Screening:HIV, hep c negative  #4 Obtundation: not clear what cause is likely multifactorial my understanding from talking with Dr. 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 I support pursuit of palliative care consult here     LOS: 12 days   Acey Lav 07/13/2013, 3:09 PM  ADDENDUM: MRI without infection. I will sign off please call with further questions.

## 2013-07-13 NOTE — Progress Notes (Signed)
06/29/2013   Patient:  Regina [name]  DOB: [birth year]  MR#:  [id number] Room#:  807  Facility:  [facility]  CODE STATUS:  NOT RESUSCITATE  Allergies: Penicillin, rash Cipro, rash Codeine, nervous/shaky Duloxetine, reaction unknown Hydrochlorothiazide, with triamterene, hyponatremia Ticlopidine HCl, bleeding  HPI: Patient hospitalized in August with DKA, discharged and two days later readmitted with falling and mental status changes.  Ongoing poorly controlled diabetes.  Patient discharged on August 29th, and sent for PT/OT, for strengthening and facilitate doing ADLs.       Social History: Pt has lived alone preparing her food in a microwave Past history of tobacco abuse, smoking > 1ppd x 40 years, no longer smokes. 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 Heart murmur Anemia GERD Vulvular cancer  Current Medications: Metformin, 500 mg one twice a day Cytomel 25 mcg once a day Ranitidine 75mg  twice a day Simvastatin 20 mg each evening Valsartan 10/20 one tablet each day Calcium 1200 mg each day Coreg 12.5 mg twice a day Aggrenox 200/25 one tablet twice a day Ferrous Fumerate 325mg  once a day Fish Oil 2000 mg each day Synthroid 50 mcg each day Levemir 5 units twice a day Humalog, via sliding scale with a max dose of 9 units when CBG is from 351-399 Travoprost ophthalmic solution, 0.004%, one drop to each eye at bedtime Lexapro 10mg  each day  Significant Diagnostic Studies: 06/13/2013 HgbA1c 9.4, with an average daily blood sugar of 227 Sodium 138 Potassium 4.1 Chloride 106 CO2 23 AGAP 9 Glucose 365 BUN 17 Creatinine 0.8 BUN/CR 21.6 calcium 9.9  GFR greater than 60  Total protein 5.7 Albumin 3.4 GL OB 2.3 ALP/GLO BP 1.5 Total  bilirubin 0.7 Direct bilirubin 0.1 ALP 60 AST 29 ALT 37  Cholesterol 138 HDL 36 LDL 64 VLDL 38.2 Triglycerides 191 CH/HDL 3.8  Thyroid-stimulating hormone 0.0460  Review of systems: Respiratory negative for cough or shortness of breath Gastrointestinal: Positive for report by family of incontinence, no report of constipation.  Difficulty with diarrhea.  Patient reports diarrhea improved, with better dietary intake.  Chart does demonstrate soft formed stools. Integumentary no report of any compromise of skin integrity Concern of anxiety/depression, patient recently started on Lexapro. Muscle/skeletal Pt now ambulating with a walker  Vital Signs: BP 124/68 Pulse 64 Respirations 17 Temperature 96  Physical examination: Patient is alert, is able to answer simple questions, has lost all simple math skills. Eyes, pale pink conjunctiva, extraocular movements intact. Ears, tympanic membranes unremarkable Oropharynx multiple broken teeth Neck is supple, no palpable adenopathy, no palpable thyromegaly. Question of carotid bruit left side versus reflected sound of heart murmur. Bilateral breath sounds are essentially clear, respirations easy and unlabored. Apical pulse, a loud, harsh, blowing murmur, best appreciated left sternal border second intercostal space, approximately 4-5/6.  Only lower extremity edema appreciated and ankles bilaterally and approximately 1-2+. No edema is noted extending up the legs. Abdomen is soft and nontender. Bowel sounds are normal throughout.  No odor of stool noted. Able to move all extremities, some bruises noted across the left lower extremity.  Assessment/plan: Would anticipate pt would need following of rountine labs, including Hgb A1c. Patient will be discharged on her current insulin regimen and is scheduled with endocrinology. Will decrease her synthroid to per day and recommend  rechecking TSH in approx. one month.  Would also suggest  implementing a toileting schedule if possible. Pt  was started on Lexapro on 09/04, now on 10mg .  Pt assessed and evaluated by contracted psychiatric services. Was started on Remeron 7.5mg  each day and magic cup with meals to help stimulate appetite and additional treatment for her depression.  Magic cup implemented for additional protein.    Pt and daughter encouraged to facilitate fluid intake, including caffeinated diet beverages. Pt's metformin dose was cut in half to minimize GI upset Unfortunately patient remains very frail but opts to be discharged, r/t insurance coverage.  Has been cautioned more than one discipline that further rehab is needed. The patient has been on the Lexapro for 2 weeks, and would anticipate a good response.   Patient is scheduled to be discharged. She will be going home with home health services, including nursing, physical therapy, and occupational therapy. She will be living with her daughter. This encounter was created in error - please disregard.

## 2013-07-13 NOTE — Progress Notes (Signed)
Speech Language Pathology Dysphagia Treatment Patient Details Name: Regina [name] MRN: [id number] DOB: 15-Apr-1941 Today's Date: 07/13/2013 Time: 0820-0840 SLP Time Calculation (min): 20 min  Assessment / Plan / Recommendation Clinical Impression  Pt continues to be inappropriate for po trials or objective study.  Decreased alertness this morning, despite verbal and tactile stim and attempts to complete oral care with suction.  RN reports pt does not follow commands, and has minimal verbalizations.  At this point, pt should remain NPO.  ST to continue to follow for po readiness.  If pt's status does not improve significantly in the next day or so, ST will likely sign off and request reconsult when pt  is more appropriate and able to participate. No family present at this time.    Diet Recommendation  Continue with Current Diet: NPO    SLP Plan Continue with current plan of care   Pertinent Vitals/Pain 0 on PAINAD score   Swallowing Goals  SLP Swallowing Goals Goal #3: Pt will swallow melted ice chips without clinical s/s of aspiration to aid in determining readiness for po intake/instrumental swallow evaluation.   Swallow Study Goal #3 - Progress: Progressing toward goal Goal #4: Pt will follow commands to dry swallow after initial ice chip swallow within 8 seconds to aid in determining readiness for po intake/instrumental swallow evaluation.  Swallow Study Goal #4 - Progress: Progressing toward goal  General Temperature Spikes Noted: No Respiratory Status: Supplemental O2 delivered via (comment) ([name] ) Behavior/Cognition: Lethargic Patient Positioning: Upright in bed  Oral Cavity - Oral Hygiene Does patient have any of the following "at risk" factors?: Oxygen therapy - cannula, mask, simple oxygen devices Brush patient's teeth BID with toothbrush (using toothpaste with fluoride): Yes Patient is AT RISK - Oral Care Protocol followed (see row info): Yes   Dysphagia  Treatment Treatment focused on: Other (comment) (diagnostic therapy to determine readiness for po vs MBS) Treatment Methods/Modalities: Skilled observation Patient observed directly with PO's: No Reason PO's not observed: Lethargic   GO   Celia B. [place], [facility], CCC-SLP [phone]    [name] 07/13/2013, 9:16 AM

## 2013-07-13 NOTE — Progress Notes (Signed)
CSW following for discharge planning - patient has a bed @ [facility] when medically ready. CSW has completed FL2 & will continue to follow and assist with return. Note patient still has a panda tube which SNF would not be able to handle.   Unice Bailey, LCSW  [facility]  Clinical Social Worker  cell #: [phone]

## 2013-07-13 NOTE — Progress Notes (Signed)
Patient went to MRI @2030 . Charge nurse and tech accompanied patient. Will continue to monitor patient.

## 2013-07-14 ENCOUNTER — Inpatient Hospital Stay (HOSPITAL_COMMUNITY): Payer: Medicare Other

## 2013-07-14 DIAGNOSIS — R627 Adult failure to thrive: Secondary | ICD-10-CM

## 2013-07-14 LAB — CBC
MCH: 31.2 pg (ref 26.0–34.0)
MCHC: 33 g/dL (ref 30.0–36.0)
MCV: 94.6 fL (ref 78.0–100.0)
Platelets: 73 10*3/uL — ABNORMAL LOW (ref 150–400)
RBC: 3.14 MIL/uL — ABNORMAL LOW (ref 3.87–5.11)
WBC: 7.2 10*3/uL (ref 4.0–10.5)

## 2013-07-14 LAB — GLUCOSE, CAPILLARY
Glucose-Capillary: 122 mg/dL — ABNORMAL HIGH (ref 70–99)
Glucose-Capillary: 127 mg/dL — ABNORMAL HIGH (ref 70–99)
Glucose-Capillary: 136 mg/dL — ABNORMAL HIGH (ref 70–99)
Glucose-Capillary: 151 mg/dL — ABNORMAL HIGH (ref 70–99)
Glucose-Capillary: 163 mg/dL — ABNORMAL HIGH (ref 70–99)
Glucose-Capillary: 229 mg/dL — ABNORMAL HIGH (ref 70–99)

## 2013-07-14 LAB — RENAL FUNCTION PANEL
Albumin: 2.2 g/dL — ABNORMAL LOW (ref 3.5–5.2)
BUN: 10 mg/dL (ref 6–23)
Calcium: 8.5 mg/dL (ref 8.4–10.5)
Chloride: 106 mEq/L (ref 96–112)
Creatinine, Ser: 0.54 mg/dL (ref 0.50–1.10)
Phosphorus: 4.2 mg/dL (ref 2.3–4.6)

## 2013-07-14 LAB — LEVETIRACETAM LEVEL: Levetiracetam Lvl: 15.2 ug/mL (ref 5.0–30.0)

## 2013-07-14 LAB — HIV-1 RNA QUANT-NO REFLEX-BLD: HIV 1 RNA Quant: <20 copies/mL (ref <20)

## 2013-07-14 MED ORDER — DEXTROSE 50 % IV SOLN
INTRAVENOUS | Status: AC
  Administered 2013-07-14: 50 mL
  Filled 2013-07-14: qty 50

## 2013-07-14 MED ORDER — SODIUM CHLORIDE 0.9 % IJ SOLN
10.0000 mL | INTRAMUSCULAR | Status: DC | PRN
  Administered 2013-07-15: 10 mL
  Administered 2013-07-16: 30 mL
  Administered 2013-07-16 – 2013-07-18 (×3): 20 mL
  Administered 2013-07-18: 30 mL
  Administered 2013-07-19: 20 mL
  Administered 2013-07-21 (×2): 10 mL

## 2013-07-14 NOTE — Progress Notes (Signed)
Transfer patient  to [facility] for 4N24C, report given to [place] Rn, p/u by [address], family at bedside.

## 2013-07-14 NOTE — Progress Notes (Signed)
[facility] PROGRESS NOTE  Regina [name] ZOX:[id number] DOB: 05/16/1941 DOA: 07/01/2013 PCP: Michiel Sites, MD Brief HPI: 72 yo female admitted with generalized weakness, and found to have UTI, diarrhea and acute renal failure. Found to have C diff. 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  Assessment/Plan: #1 acute encephalopathy Questionable etiology. Clinical improvement. Patient alert,but not following commands. May be secondary to seizures in the setting of CVA. Per nursing patient with right upper extremity jerking motions occasionally.  CT of the head was negative. MRI of the head is negative for any acute infarct. EEG is negative for any epileptiform discharges. Patient was hypokalemic and potassium repleted.  Potassium today is 4.1. Keep potassium greater than 4.  Placed on Ativan 1 mg IV every 2 hours when necessary. Neuro checks every 4 hours. S/P lumbar puncture, however had been on antibiotic prior to LP. CSF cultures neg. CSF HSV cultures pending.  Aggrenox and Lovenox which have been discontinued. Repeat blood cultures pending. Replete electrolytes.  Was empirically on IV vancomycin, Rocephin, acyclovir. Continue IV Keppra. Neurology and ID following and appreciate input and recommendations. She underwent a repeat MRI of the brain showed newly seen punctate infarction along the surface of a left parietal gyrus at the vertex. MRI LS done does not show any infection or acute injury. Neurology recommended video EEG and she is being transferred to [facility].   #2 probable seizure Patient noted to have jerking motion of the right upper extremity mostly throughout the night  5 days ago in addition to altered mental status. Patient's eyes rolled and deviated to the right. Patient also noted to have a fever. CT of the head was negative. MRI of the head was negative. EEG was negative. Significant improvement in her jerking in the right upper extremity. Patient was loaded with IV  Keppra and is on Keppra IV every 12 hours. Cultures are pending. On empiric IV vancomycin, Rocephin, acyclovir  for possible meningitis, which have been stopped by ID on 9/30. Neuro checks. Neurology following and appreciate input and recommendations. MRI of the L/S spinedone and  palliative care consult for goals of care requested, meeting scheduled in am.  #3 possible meningitis/CSF infection  Patient noted to have a rectal temperature 102.9  5 days ago. Fever curve trending down. Patient had significant nuchal rigidity. Patient with altered mental status, but improving daily. Blood cultures are pending. Patient s/p LP .  MRI head negative. EEG negative. Culture from CSF neg. HSV CSF cultures pending, patient however was on empiric IV antibiotics for 2-3 days prior to LP. Continue to hold Aggrenox and Lovenox for now as platelets at 62. Mental status  improving. Neurology following and appreciate input and recommendations. Ampicillin and decadron d/c'd per ID. ID and neuro FF and appreciate input and rxcs. Per ID stop vanc, rocephin, acyclovir today.  #4 acute renal failure Likely secondary to volume depletion and hypotension in the setting of ARB. Clinical improvement. Renal function with creatinine now of 0.68. Good urine output with 2400 cc over the past 24 hours. Continue Foley catheter. Bicarbonate drip has been discontinued as acidosis has resolved. Continue to hold ARB and metformin.   #5 hypokalemia/hypomagnesemia Likely secondary to GI losses from C. difficile colitis and increased urinary output. Replete.Keep magnesium at 2.   #6 C. difficile colitis Continue oral vancomycin to per panda. Follow.  #7 hypernatremia Sodium levels improved and now at 139. Decrease IVF. follow.   #8 Escherichia coli UTI Patient had received  IV Rocephin x3 days.   #9 hypertension D/C IV Lopressor. Resume home dose coreg and norvasc 5 mg daily.  #10 history of CVA Aggrenox held for lumbar puncture 2  days ago. Patient alert, however platelets 62. Will defer to neurology, when to resume.  #11 diabetes mellitus Hemoglobin A1c is 9.6. CBGs have ranged from 114-161. Decadron d/c'd. Continue Levemir 45 units subcutaneous twice a day. Continue sliding scale insulin. CBG (last 3)   Recent Labs  07/14/13 0031 07/14/13 0416 07/14/13 0743  GLUCAP 136* 122* 127*     #12 hypothyroidism Continue Synthroid and Cytomel.  #13 anxiety/depression Stable.  #14 Nuitrition Placed panda tube 9/29. On TF. SLP for eval.Patient with diarrhea which started after TF.   #15. Thrombocytopenia Likely secondary to infection. No bleeding noted. Hold lovenox. Follow.  #16 prophylaxis  Pepcid for GI prophylaxis. SCDs for DVT prophylaxis.   Code Status: DNR Family Communication: none at bedside.  Disposition Plan: pending palliative care consult   Consultants:  Nephrology: Dr. Eliott Nine 07/02/2013:  Neurology : Dr Thad Ranger 07/06/13  ID: Dr Luciana Axe 07/09/13  Procedures: MRI brain 8/27 >> small acute lacunar infarct in medial Rt thalamus, ? Punctate acute lacunar infarct central pons CT head 07/05/2013  KUB 07/05/2013  KUB 07/01/2013  Chest x-ray 07/01/2013  MRI head 07/06/13  EEG 07/06/13  LP 07/07/13  PICC line 07/09/13  Antibiotics:  IV Rocephin 07/01/2013--> 07/05/2013  IV Flagyl 07/04/2013-->07/10/13  Oral flagyl 07/10/13-9/30  IV vancomycin 07/06/2013-9/30  IV Rocephin 07/06/2013- 9/30  IV ampicillin 07/06/2013--->07/09/13  IV Acyclovir 07/06/13- 9/30  Oral vancomycin 9/30  HPI/Subjective: Patient is alert, not following  commands.  RUE jerking motions  Objective: Filed Vitals:   07/14/13 0419  BP: 156/62  Pulse: 79  Temp: 97.9 F (36.6 C)  Resp: 18    Intake/Output Summary (Last 24 hours) at 07/14/13 1021 Last data filed at 07/14/13 0717  Gross per 24 hour  Intake 5718.67 ml  Output   3400 ml  Net 2318.67 ml   Filed Weights   07/12/13 0441 07/13/13 0425  07/14/13 0419  Weight: 103.012 kg (227 lb 1.6 oz) 103.4 kg (227 lb 15.3 oz) 103.4 kg (227 lb 15.3 oz)    Exam:   General:  Eyes open. Comfortable.   Cardiovascular: RRR  Respiratory: CTAB anterior lung fields.  Abdomen: Soft/NT/ND/+BS  Musculoskeletal: No c/c/e Neuro: Patient is alert. But not following commands ,  Data Reviewed: Basic Metabolic Panel:  Recent Labs Lab 07/08/13 0340  07/09/13 0340 07/10/13 0515 07/11/13 0630 07/12/13 0505 07/13/13 0444 07/14/13 0510  NA 153*  < > 144 136 139 140 138 140  K 3.8  < > 3.6 3.0* 4.1 4.2 4.4 4.1  CL 109  < > 101 97 105 106 106 106  CO2 33*  < > 34* 31 27 26 27 29   GLUCOSE 365*  < > 261* 202* 106* 250* 181* 137*  BUN 18  < > 14 15 14 11 11 10   CREATININE 0.94  < > 0.82 0.71 0.68 0.64 0.61 0.54  CALCIUM 7.5*  < > 7.5* 7.2* 7.8* 8.1* 8.3* 8.5  MG 1.2*  --  1.5 1.5 1.6 1.7  --   --   PHOS 2.8  2.7  --  2.1* 2.3  2.3 3.6 3.6 4.0 4.2  < > = values in this interval not displayed. Liver Function Tests:  Recent Labs Lab 07/10/13 0515 07/11/13 0630 07/12/13 0505 07/13/13 0444 07/14/13 0510  ALBUMIN 2.2* 2.2* 2.2* 2.1* 2.2*  No results found for this basename: LIPASE, AMYLASE,  in the last 168 hours No results found for this basename: AMMONIA,  in the last 168 hours CBC:  Recent Labs Lab 07/09/13 0340 07/10/13 0515 07/11/13 0630 07/12/13 0505 07/14/13 0510  WBC 6.4 7.5 5.7 6.2 7.2  NEUTROABS  --   --  3.5  --   --   HGB 12.6 12.0 11.2* 10.6* 9.8*  HCT 38.2 35.2* 33.6* 32.2* 29.7*  MCV 92.9 90.7 92.3 93.6 94.6  PLT 112* 73* 62* 59* 73*   Cardiac Enzymes: No results found for this basename: CKTOTAL, CKMB, CKMBINDEX, TROPONINI,  in the last 168 hours BNP (last 3 results) No results found for this basename: PROBNP,  in the last 8760 hours CBG:  Recent Labs Lab 07/13/13 1736 07/13/13 2145 07/14/13 0031 07/14/13 0416 07/14/13 0743  GLUCAP 169* 154* 136* 122* 127*    Recent Results (from the past 240  hour(s))  CULTURE, BLOOD (ROUTINE X 2)     Status: None   Collection Time    07/05/13 11:20 PM      Result Value Range Status   Specimen Description BLOOD LEFT HAND   Final   Special Requests BOTTLES DRAWN AEROBIC AND ANAEROBIC 3CC   Final   Culture  Setup Time     Final   Value: 07/06/2013 04:42     Performed at [facility]   Culture     Final   Value: NO GROWTH 5 DAYS     Performed at [facility]   Report Status 07/12/2013 FINAL   Final  CULTURE, BLOOD (ROUTINE X 2)     Status: None   Collection Time    07/05/13 11:24 PM      Result Value Range Status   Specimen Description BLOOD RIGHT ANTECUBITAL   Final   Special Requests BOTTLES DRAWN AEROBIC AND ANAEROBIC 5CC   Final   Culture  Setup Time     Final   Value: 07/06/2013 04:42     Performed at [facility]   Culture     Final   Value: NO GROWTH 5 DAYS     Performed at [facility]   Report Status 07/12/2013 FINAL   Final  URINE CULTURE     Status: None   Collection Time    07/06/13  8:01 AM      Result Value Range Status   Specimen Description URINE, CATHETERIZED   Final   Special Requests NONE   Final   Culture  Setup Time     Final   Value: 07/06/2013 10:49     Performed at [facility] Count     Final   Value: 40,000 COLONIES/ML     Performed at [facility]   Culture     Final   Value: YEAST     Performed at [facility]   Report Status 07/07/2013 FINAL   Final  CULTURE, BLOOD (SINGLE)     Status: None   Collection Time    07/06/13  8:45 AM      Result Value Range Status   Specimen Description BLOOD RIGHT ARM   Final   Special Requests BOTTLES DRAWN AEROBIC ONLY 3CC    Final   Culture  Setup Time     Final   Value: 07/06/2013 10:49     Performed at [facility]   Culture     Final   Value: NO  GROWTH 5 DAYS     Performed at [facility]   Report Status 07/12/2013 FINAL   Final  MRSA PCR SCREENING     Status: None    Collection Time    07/06/13  9:25 AM      Result Value Range Status   MRSA by PCR NEGATIVE  NEGATIVE Final   Comment:            The GeneXpert MRSA Assay (FDA     approved for NASAL specimens     only), is one component of a     comprehensive MRSA colonization     surveillance program. It is not     intended to diagnose MRSA     infection nor to guide or     monitor treatment for     MRSA infections.  GRAM STAIN     Status: None   Collection Time    07/07/13  5:40 PM      Result Value Range Status   Specimen Description CSF CSF   Final   Special Requests NONE   Final   Gram Stain     Final   Value: NO ORGANISMS SEEN     NO WBC SEEN     Gram Stain Report Called to,Read Back By and Verified With: AYERS,C. AT 1958 ON 09.26.14 BY LOVE,T.   Report Status 07/07/2013 FINAL   Final  BODY FLUID CULTURE     Status: None   Collection Time    07/07/13  5:42 PM      Result Value Range Status   Specimen Description Body Fluid   Final   Special Requests NONE   Final   Gram Stain     Final   Value: NO WBC SEEN     NO ORGANISMS SEEN     Performed at [facility]   Culture     Final   Value: NO GROWTH 3 DAYS     Performed at [facility]   Report Status 07/11/2013 FINAL   Final     Studies: Mr Brain Wo Contrast  07/14/2013   CLINICAL DATA:  Altered mental status.  Intermittent seizures.  EXAM: MRI HEAD WITHOUT CONTRAST  TECHNIQUE: Multiplanar, multisequence MR imaging was performed. No intravenous contrast was administered.  COMPARISON:  07/06/2013.  06/08/2013.  FINDINGS: Diffusion imaging shows a punctate acute infarction along the surface of a left parietal gyrus at the vertex. No other acute infarction. Chronic ischemic changes for seen within the pons. There are old small vessel cerebellar infarctions. The old infarction in the right thalamus is evident. Old lacunar infarctions in the basal ganglia and chronic small vessel changes throughout the deep white matter remain  evident. No large vessel territory infarction. No mass lesion, hemorrhage, hydrocephalus or extra-axial collection. No pituitary mass. No inflammatory sinus disease.  IMPRESSION: Newly seen punctate acute infarction along the surface of a left parietal gyrus at the vertex.  Old ischemic changes elsewhere throughout the brain as outlined above.   Electronically Signed   By: Paulina Fusi M.D.   On: 07/14/2013 07:26   Mr Lumbar Spine W Wo Contrast  07/13/2013   CLINICAL DATA:  Left-sided weakness.  Question injury or infection.  EXAM: MRI LUMBAR SPINE WITHOUT AND WITH CONTRAST  TECHNIQUE: Multiplanar and multiecho pulse sequences of the lumbar spine were obtained without and with intravenous contrast.  CONTRAST:  20mL MULTIHANCE GADOBENATE DIMEGLUMINE 529 MG/ML IV SOLN  COMPARISON:  None available.  FINDINGS:  Normal signal is present in the conus medullaris which terminates at L1, within normal limits. Marrow signal is somewhat heterogeneous without a discrete lesion. Mild endplate changes are present at L3-4. No pathologic enhancement is present to suggest infection or mass. A remote superior endplate fracture is present at T12. No acute fractures are present. Limited imaging of the abdomen demonstrates thinning of the renal parenchyma on the left. No discrete lesions are present.  The disc levels at L2-3 and above are normal.  L3-4: A mild broad-based disc bulge is present. There is no significant stenosis.  L4-5: Negative.  L5-S1: Negative.  IMPRESSION: 1. Mild broad-based disk bulging and facet hypertrophy at L3-4 without significant stenosis. 2. No evidence for infection or acute injury. 3. No acute or focal abnormality to explain the patient's symptoms.   Electronically Signed   By: Gennette Pac   On: 07/13/2013 15:42    Scheduled Meds: . amLODipine  10 mg Per NG tube Daily  . antiseptic oral rinse  15 mL Mouth Rinse q12n4p  . carvedilol  12.5 mg Per NG tube BID WC  . chlorhexidine  15 mL Mouth Rinse  BID  . insulin aspart  0-20 Units Subcutaneous Q4H  . insulin detemir  50 Units Subcutaneous BID  . latanoprost  1 drop Both Eyes QHS  . levETIRAcetam  500 mg Intravenous Q12H  . levothyroxine  75 mcg Per NG tube QAC breakfast  . liothyronine  25 mcg Per NG tube BID  . saccharomyces boulardii  250 mg Oral BID  . sodium chloride  10-40 mL Intracatheter Q12H  . sodium chloride  3 mL Intravenous Q12H  . vancomycin  125 mg Per Tube Q6H   Continuous Infusions: . feeding supplement (JEVITY 1.2 CAL) 1,000 mL (07/14/13 0212)  . sodium chloride 0.9 % 1,000 mL with potassium chloride 20 mEq infusion 100 mL/hr at 07/14/13 0209    Principal Problem:   Acute encephalopathy Active Problems:   HYPOTHYROIDISM   DIABETES MELLITUS, TYPE II   HYPERLIPIDEMIA   UTI (urinary tract infection)   HTN (hypertension)   Anxiety and depression   CVA (cerebral infarction)   Weakness generalized   Dehydration   Acute renal failure   Failure to thrive   Diarrhea   Hyponatremia   Metabolic acidosis   Clostridium difficile colitis   Hypomagnesemia   Hypernatremia   Seizures   Meningitis, unspecified(322.9)   Protein-calorie malnutrition, severe   Thrombocytopenia, unspecified    Time spent: > 35 mins    Brolin Dambrosia  [facility] Pager [phone] If 7PM-7AM, please contact night-coverage at [email or web address], password [email or web address] 07/14/2013, 10:21 AM  LOS: 13 days

## 2013-07-14 NOTE — Progress Notes (Signed)
Continuous EEG started.

## 2013-07-14 NOTE — Progress Notes (Signed)
NEURO HOSPITALIST PROGRESS NOTE   SUBJECTIVE:                                                                                                                        Per niece who is at bedside , patient is moving her right arm more today but remains confused. Patient is non vocal and has difficulty following commands.   OBJECTIVE:                                                                                                                           Vital signs in last 24 hours: Temp:  [97.9 F (36.6 C)-98.4 F (36.9 C)] 97.9 F (36.6 C) (10/03 0419) Pulse Rate:  [78-94] 79 (10/03 0419) Resp:  [18] 18 (10/03 0419) BP: (141-156)/(55-79) 156/62 mmHg (10/03 0419) SpO2:  [99 %-100 %] 100 % (10/03 0419) Weight:  [103.4 kg (227 lb 15.3 oz)] 103.4 kg (227 lb 15.3 oz) (10/03 0419)  Intake/Output from previous day: 10/02 0701 - 10/03 0700 In: 5823.7 [I.V.:3686.7; NG/GT:1822; IV Piggyback:315] Out: 2900 [Urine:2900] Intake/Output this shift: Total I/O In: 0  Out: 500 [Urine:500] Nutritional status: NPO  Past Medical History  Diagnosis Date  . Hypertension   . Hypercholesteremia   . TIA (transient ischemic attack) 1990's    "mini strokes" (06/07/2013)  . Legally blind     "both eyes; some vision in the right' (06/07/2013)  . Heart murmur   . Hypothyroidism   . Type II diabetes mellitus   . Anemia   . GERD (gastroesophageal reflux disease)   . Arthritis     "left leg" (06/07/2013)  . Depression   . Vulva cancer   . Altered mental status     "the first time I noticed any problem was 2 d ago" (06/07/2013)     Neurologic Exam:  Mental Status: Alert, at times will follow commands such as showing me her thumb and mimicking my visual cues but cannot follow complex commands.  Cranial Nerves: II: Visual fields grossly normal, pupils equal, round, reactive to light and accommodation III,IV, VI: ptosis not present, extra-ocular motions intact  bilaterally V,VII: smile asymmetric on the right, facial light touch sensation normal bilaterally VIII: hearing normal bilaterally IX,X: gag reflex  present XI: bilateral shoulder shrug XII: midline tongue extension Motor: Moving all extremities antigravity with 4/5.  LE hard to examine as she does not cooperate with exam.  Sensory: Pinprick and light touch intact throughout, bilaterally Deep Tendon Reflexes:  1+ in UE and no KJ or AJ   Lab Results: Lab Results  Component Value Date/Time   CHOL 211* 06/05/2013  6:50 AM   Lipid Panel No results found for this basename: CHOL, TRIG, HDL, CHOLHDL, VLDL, LDLCALC,  in the last 72 hours  Studies/Results: Mr Brain Wo Contrast  07/14/2013   CLINICAL DATA:  Altered mental status.  Intermittent seizures.  EXAM: MRI HEAD WITHOUT CONTRAST  TECHNIQUE: Multiplanar, multisequence MR imaging was performed. No intravenous contrast was administered.  COMPARISON:  07/06/2013.  06/08/2013.  FINDINGS: Diffusion imaging shows a punctate acute infarction along the surface of a left parietal gyrus at the vertex. No other acute infarction. Chronic ischemic changes for seen within the pons. There are old small vessel cerebellar infarctions. The old infarction in the right thalamus is evident. Old lacunar infarctions in the basal ganglia and chronic small vessel changes throughout the deep white matter remain evident. No large vessel territory infarction. No mass lesion, hemorrhage, hydrocephalus or extra-axial collection. No pituitary mass. No inflammatory sinus disease.  IMPRESSION: Newly seen punctate acute infarction along the surface of a left parietal gyrus at the vertex.  Old ischemic changes elsewhere throughout the brain as outlined above.   Electronically Signed   By: Paulina Fusi M.D.   On: 07/14/2013 07:26   Mr Lumbar Spine W Wo Contrast  07/13/2013   CLINICAL DATA:  Left-sided weakness.  Question injury or infection.  EXAM: MRI LUMBAR SPINE WITHOUT AND WITH  CONTRAST  TECHNIQUE: Multiplanar and multiecho pulse sequences of the lumbar spine were obtained without and with intravenous contrast.  CONTRAST:  20mL MULTIHANCE GADOBENATE DIMEGLUMINE 529 MG/ML IV SOLN  COMPARISON:  None available.  FINDINGS: Normal signal is present in the conus medullaris which terminates at L1, within normal limits. Marrow signal is somewhat heterogeneous without a discrete lesion. Mild endplate changes are present at L3-4. No pathologic enhancement is present to suggest infection or mass. A remote superior endplate fracture is present at T12. No acute fractures are present. Limited imaging of the abdomen demonstrates thinning of the renal parenchyma on the left. No discrete lesions are present.  The disc levels at L2-3 and above are normal.  L3-4: A mild broad-based disc bulge is present. There is no significant stenosis.  L4-5: Negative.  L5-S1: Negative.  IMPRESSION: 1. Mild broad-based disk bulging and facet hypertrophy at L3-4 without significant stenosis. 2. No evidence for infection or acute injury. 3. No acute or focal abnormality to explain the patient's symptoms.   Electronically Signed   By: Gennette Pac   On: 07/13/2013 15:42    MEDICATIONS  Scheduled: . amLODipine  10 mg Per NG tube Daily  . antiseptic oral rinse  15 mL Mouth Rinse q12n4p  . carvedilol  12.5 mg Per NG tube BID WC  . chlorhexidine  15 mL Mouth Rinse BID  . insulin aspart  0-20 Units Subcutaneous Q4H  . insulin detemir  50 Units Subcutaneous BID  . latanoprost  1 drop Both Eyes QHS  . levETIRAcetam  500 mg Intravenous Q12H  . levothyroxine  75 mcg Per NG tube QAC breakfast  . liothyronine  25 mcg Per NG tube BID  . saccharomyces boulardii  250 mg Oral BID  . sodium chloride  10-40 mL Intracatheter Q12H  . sodium chloride  3 mL Intravenous Q12H  . vancomycin  125 mg Per Tube Q6H     ASSESSMENT/PLAN:                                                                                                            72 year old female with persistent altered mental status that started shortly before addition to the hospital. LP and MRI have been negative, with repeat MRI showing a punctate acute infarction along the surface of a left parietal gyrus which would not explain her confusion. I have spoken to Dr. Blake Divine and patient will be transferred to [facility] hospital for prolonged EEG. Palliative care has a meeting with family at 14 AM on 10/414 and is also aware of the transfer.    Assessment and plan discussed with with attending physician and they are in agreement.    Felicie Morn PA-C [facility] [phone]  07/14/2013, 9:52 AM

## 2013-07-14 NOTE — Progress Notes (Signed)
Blood sugar was 63, D 50 given. Recheck blood sugar was 163. Will monitor patient.

## 2013-07-14 NOTE — Progress Notes (Signed)
PT Cancellation Note  Patient Details Name: ARISTA KETTLEWELL MRN: [id number] DOB: 1941/10/03   Cancelled Treatment:    Reason Eval/Treat Not Completed: Medical issues which prohibited therapy Pt heading to [facility]  for test/procedure this morning.    Marella Bile 07/14/2013, 10:15 AM Marella Bile, PT Pager: [phone] 07/14/2013

## 2013-07-15 ENCOUNTER — Encounter (HOSPITAL_COMMUNITY): Payer: Self-pay | Admitting: Radiology

## 2013-07-15 ENCOUNTER — Inpatient Hospital Stay (HOSPITAL_COMMUNITY): Payer: Medicare Other

## 2013-07-15 DIAGNOSIS — R413 Other amnesia: Secondary | ICD-10-CM

## 2013-07-15 LAB — RENAL FUNCTION PANEL
CO2: 28 mEq/L (ref 19–32)
Chloride: 103 mEq/L (ref 96–112)
GFR calc Af Amer: >90 mL/min (ref >90)
GFR calc non Af Amer: >90 mL/min (ref >90)
Glucose, Bld: 155 mg/dL — ABNORMAL HIGH (ref 70–99)
Potassium: 4.2 mEq/L (ref 3.5–5.1)
Sodium: 139 mEq/L (ref 135–145)

## 2013-07-15 LAB — GLUCOSE, CAPILLARY
Glucose-Capillary: 127 mg/dL — ABNORMAL HIGH (ref 70–99)
Glucose-Capillary: 156 mg/dL — ABNORMAL HIGH (ref 70–99)

## 2013-07-15 LAB — VITAMIN B12: Vitamin B-12: 101 pg/mL — ABNORMAL LOW (ref 211–911)

## 2013-07-15 LAB — FOLATE: Folate: 19.6 ng/mL

## 2013-07-15 MED ORDER — ALTEPLASE 2 MG IJ SOLR
2.0000 mg | Freq: Once | INTRAMUSCULAR | Status: AC
  Administered 2013-07-15: 2 mg
  Filled 2013-07-15 (×2): qty 2

## 2013-07-15 MED ORDER — IOHEXOL 350 MG/ML SOLN
50.0000 mL | Freq: Once | INTRAVENOUS | Status: AC | PRN
  Administered 2013-07-15: 50 mL via INTRAVENOUS

## 2013-07-15 MED ORDER — ALTEPLASE 2 MG IJ SOLR
2.0000 mg | Freq: Once | INTRAMUSCULAR | Status: DC
  Filled 2013-07-15: qty 2

## 2013-07-15 NOTE — Consult Note (Signed)
Patient [name]      DOB: [birth year]      AVW:[id number]     Consult Note from the Palliative Medicine Team at [facility]    Consult Requested by: Dr.  Blake Divine    PCP: Michiel Sites, MD Reason for Consultation: GOC    Phone Number:[phone] Related symptom recommendations Assessment of patients Current state: 73 yr old white female with admitted with rapidly progressive alteration in the mental status, weakness.  Patient has history of CVA/TIA, legal blindness and perhaps an element of dementia although not confirmed by family.  Patient was transferred to [facility] for 24 hour EEG monitor as their was some concern for seizure activity.  MRI did show new left parietal infarction.   Goals of Care: 1.  Code Status: No CPR, No Ventilator support.  Daughter would consider vasopressors, and antiarrhythmics iff needed.   2. Scope of Treatment: Daughter would like to continue to try to discover source of encephalopathy.  Willing to reevaluate as we see how she does.  Today is the first day she has really interacted   4. Disposition: to be determined   3. Symptom Management:   -Altered mental status: remains on ant-iseizure medication.Avoid sedating medications.  -Dysphagia:  Speech therapy to reevaluate now that she is more awake  -Request PT re-eval now that she is more awake and follow   4. Psychosocial: patient has been living at home with assistance for the better part of this year.  She has poor eye site which limits her ability to drive and perform other activities of daily living  5. Spiritual:  Family aware of chaplain support        Patient Documents Completed or Given: Document Given Completed  Advanced Directives Pkt    MOST    DNR    Gone from My Sight    Hard Choices      Brief HPI: 72 yr old white female with multiple chronic illnesses admitted with worsening mental status of unclear etiology.  We have been asked to assist with [facility] as she  continue to have no single source for her condition   ROS: unable to fully obtain due to patient's altered mental status    PMH:  Past Medical History  Diagnosis Date  . Hypertension   . Hypercholesteremia   . TIA (transient ischemic attack) 1990's    "mini strokes" (06/07/2013)  . Legally blind     "both eyes; some vision in the right' (06/07/2013)  . Heart murmur   . Hypothyroidism   . Type II diabetes mellitus   . Anemia   . GERD (gastroesophageal reflux disease)   . Arthritis     "left leg" (06/07/2013)  . Depression   . Vulva cancer   . Altered mental status     "the first time I noticed any problem was 2 d ago" (06/07/2013)     PSH: Past Surgical History  Procedure Laterality Date  . Femur fracture surgery Left 1999  . Cesarean section  1968  . Tubal ligation  1970's  . Vulva surgery  1990's    "cut a big chunk out for cancer" (06/07/2013)   I have reviewed the FH and SH and  If appropriate update it with new information. Allergies  Allergen Reactions  . Amoxicillin     REACTION: rash  . Ciprofloxacin     REACTION: hives  . Codeine     REACTION: nervous, shaky  . Duloxetine  REACTION: reaction not known  . Hydrochlorothiazide W-Triamterene     REACTION: hyponatremia  . Ticlopidine Hcl     REACTION: bleeding   Scheduled Meds: . amLODipine  10 mg Per NG tube Daily  . antiseptic oral rinse  15 mL Mouth Rinse q12n4p  . carvedilol  12.5 mg Per NG tube BID WC  . chlorhexidine  15 mL Mouth Rinse BID  . insulin aspart  0-20 Units Subcutaneous Q4H  . insulin detemir  50 Units Subcutaneous BID  . latanoprost  1 drop Both Eyes QHS  . levETIRAcetam  500 mg Intravenous Q12H  . levothyroxine  75 mcg Per NG tube QAC breakfast  . liothyronine  25 mcg Per NG tube BID  . saccharomyces boulardii  250 mg Oral BID  . sodium chloride  10-40 mL Intracatheter Q12H  . sodium chloride  3 mL Intravenous Q12H  . vancomycin  125 mg Per Tube Q6H   Continuous Infusions: .  feeding supplement (JEVITY 1.2 CAL) 1,000 mL (07/15/13 1120)  . sodium chloride 0.9 % 1,000 mL with potassium chloride 20 mEq infusion 100 mL/hr at 07/15/13 0859   PRN Meds:.acetaminophen, acetaminophen, albuterol, hydrALAZINE, LORazepam, ondansetron (ZOFRAN) IV, promethazine, sodium chloride, sodium chloride    BP 140/63  Pulse 98  Temp(Src) 98.9 F (37.2 C) (Oral)  Resp 20  Ht 5\' 2"  (1.575 m)  Wt 105.9 kg (233 lb 7.5 oz)  BMI 42.69 kg/m2  SpO2 99%   PPS: 20 - 30%   Intake/Output Summary (Last 24 hours) at 07/15/13 1213 Last data filed at 07/15/13 0630  Gross per 24 hour  Intake      0 ml  Output   3600 ml  Net  -3600 ml   LBM: 07/13/13                       Physical Exam:  General: Awake , alert oriented at least to her name.  Able to speak in some complete sentances, not oriented to time or place.  Compared to yesterday much more awake.  Moving both extremities but not cognitively aware of left side HEENT:  PERRL, EOMi, anicteric, mmm, neglecting left side, feeding tube in nare Chest:  Decreased but clear CVS: regular , rate and rhythm Abdomen: soft, not distended or tender Ext: moving all extremities but appears to neglect left side Neuro:left sided neglect, following some commands, but very slow  Labs: CBC    Component Value Date/Time   WBC 7.2 07/14/2013 0510   RBC 3.14* 07/14/2013 0510   HGB 9.8* 07/14/2013 0510   HCT 29.7* 07/14/2013 0510   PLT 73* 07/14/2013 0510   MCV 94.6 07/14/2013 0510   MCH 31.2 07/14/2013 0510   MCHC 33.0 07/14/2013 0510   RDW 15.7* 07/14/2013 0510   LYMPHSABS 1.6 07/11/2013 0630   MONOABS 0.7 07/11/2013 0630   EOSABS 0.0 07/11/2013 0630   BASOSABS 0.1 07/11/2013 0630     CMP     Component Value Date/Time   NA 139 07/15/2013 0525   K 4.2 07/15/2013 0525   CL 103 07/15/2013 0525   CO2 28 07/15/2013 0525   GLUCOSE 155* 07/15/2013 0525   BUN 9 07/15/2013 0525   CREATININE 0.50 07/15/2013 0525   CALCIUM 8.5 07/15/2013 0525   PROT 6.4  07/06/2013 0830   ALBUMIN 2.3* 07/15/2013 0525   AST 14 07/06/2013 0830   ALT 10 07/06/2013 0830   ALKPHOS 67 07/06/2013 0830   BILITOT 0.3 07/06/2013 0830  GFRNONAA >90 07/15/2013 0525   GFRAA >90 07/15/2013 0525    Chest Xray Reviewed/Impressions: no infiltrate central vascular congestion  CT scan of the Head Reviewed/Impressions: atrophy and chronic microvascular changes.  Discussed with Dr. Petra Kuba at the bedside with family.  Time In Time Out Total Time Spent with Patient Total Overall Time  915 am  1020 am 20 min 65 min    Greater than 50%  of this time was spent counseling and coordinating care related to the above assessment and plan.   Ad Guttman L. Ladona Ridgel, MD MBA The Palliative Medicine Team at [facility] Phone: [phone] Pager: [phone]

## 2013-07-15 NOTE — Progress Notes (Signed)
SLP Cancellation Note  Patient Details Name: Regina [name] MRN: [id number] DOB: 1941-02-08   Cancelled treatment:        Unable to assess po readiness at this time. Pt leaving unit for CT.  Per RN, alertness continues to be highly variable. Will continue efforts. Pt is currently nutritionally supported via TF.  Celia B. [place], [facility], CCC-SLP [phone]  [name] 07/15/2013, 3:15 PM

## 2013-07-15 NOTE — Progress Notes (Signed)
LTVM discontinued. 

## 2013-07-15 NOTE — Progress Notes (Signed)
Subjective: Patient has had continued improvement of the past 2 days. She has much more verbal output and she did 2 days ago, and is much more responsive to her family members.  Exam: Filed Vitals:   07/15/13 0930  BP: 140/63  Pulse: 98  Temp: 98.9 F (37.2 C)  Resp: 20   Gen: In bed, NAD MS: Awakens easily, is able to answer simple questions such as who is in the room with her, CN: Pupils equal round and reactive, extraocular movements intact, visual fields full (able to count fingers in all fields) Motor: Moving both arms equally. Sensory: Reports sensation in all 4 extremities   Impression: 72 year old female with acute encephalopathy in the setting of high fever. Though she was treated with antibiotics for 2 days prior LP, if she had had a bacterial meningitis, I would have expected continued inflammation, thoug this is a possibility. The stroke seen on the recent MRI is tiny, and I would not expect it to be causing her current mental status changes. With improvement, may consider that this still could be a multifactorial delirium, however with the recent strokes, will get a CTA head and neck  Recommendations: 1)CTA head and neck.  2) will continue to follow with you.   Ritta Slot, MD [facility] [phone]  If 7pm- 7am, please page neurology on call at [phone].

## 2013-07-15 NOTE — Progress Notes (Signed)
[facility] PROGRESS NOTE  [name] ONG:[id number] DOB: July 18, 1941 DOA: 07/01/2013 PCP: Michiel Sites, MD Brief HPI: 72 yo female admitted with generalized weakness, and found to have UTI, diarrhea and acute renal failure. Found to have C diff. 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  Assessment/Plan: #1 acute encephalopathy Questionable etiology. Clinical improvement. Patient alert,but not following commands. May be secondary to seizures in the setting of CVA.  - Neurology currently performing prolonged EEG evaluation to assess for ongoing seizure like activity. - Will await further recommendations from neurology moving forward.   #2 probable seizure EEG currently in progress Per neurology.  #3 possible meningitis/CSF infection  Patient noted to have a rectal temperature 102.9  5 days ago. Fever curve trending down. Patient had significant nuchal rigidity. Patient with altered mental status, but improving daily. Blood cultures are pending. Patient s/p LP .   - ID on board and currently has discontinued antiviral medication and antibiotics based on low index of suspicion of infectious etiology as cause of presenting symptoms. Neurology on board and currently investigating for vasculitis.  #4 acute renal failure Likely secondary to volume depletion and hypotension in the setting of ARB. Clinical improvement.  Arb and metformin held - Resolved.  #5 hypokalemia/hypomagnesemia Likely secondary to GI losses from C. difficile colitis and increased urinary output. Replete.Keep magnesium at 2.   #6 C. difficile colitis Continue oral vancomycin to per panda. Follow.  #7 hypernatremia Sodium levels improved and now at 139. Decrease IVF. follow.   #8 Escherichia coli UTI Patient had received IV Rocephin x3 days.   #9 hypertension D/C IV Lopressor. Resume home dose coreg and norvasc 5 mg daily. - continue to monitor and adjust antihypertensive medication as  needed.  #10 history of CVA Aggrenox held for lumbar puncture 2 days ago. Patient alert, however platelets 62. Will defer to neurology, when to resume.  #11 diabetes mellitus Hemoglobin A1c is 9.6. CBGs have ranged from 114-161. Decadron d/c'd. Continue Levemir 45 units subcutaneous twice a day. Continue sliding scale insulin. CBG (last 3)   Recent Labs  07/15/13 0431 07/15/13 0832 07/15/13 1134  GLUCAP 151* 137* 147*     #12 hypothyroidism Continue Synthroid and Cytomel.  #13 anxiety/depression Stable.  #14 Nuitrition Placed panda tube 9/29. On TF. SLP for eval.Patient with diarrhea which started after TF.   #15. Thrombocytopenia Likely secondary to infection. No bleeding noted. Hold lovenox. Follow.  #16 prophylaxis  Pepcid for GI prophylaxis. SCDs for DVT prophylaxis.   Code Status: DNR Family Communication: none at bedside.  Disposition Plan: pending palliative care consult and further recommendations from Neurology   Consultants:  Nephrology: Dr. Eliott Nine 07/02/2013:  Neurology : Dr Thad Ranger 07/06/13  ID: Dr Luciana Axe 07/09/13  Procedures: MRI brain 8/27 >> small acute lacunar infarct in medial Rt thalamus, ? Punctate acute lacunar infarct central pons CT head 07/05/2013  KUB 07/05/2013  KUB 07/01/2013  Chest x-ray 07/01/2013  MRI head 07/06/13  EEG 07/06/13  LP 07/07/13  PICC line 07/09/13  Antibiotics:  IV Rocephin 07/01/2013--> 07/05/2013  IV Flagyl 07/04/2013-->07/10/13  Oral flagyl 07/10/13-9/30  IV vancomycin 07/06/2013-9/30  IV Rocephin 07/06/2013- 9/30  IV ampicillin 07/06/2013--->07/09/13  IV Acyclovir 07/06/13- 9/30  Oral vancomycin 9/30  HPI/Subjective: No acute issues reported overnight.  Objective: Filed Vitals:   07/15/13 0930  BP: 140/63  Pulse: 98  Temp: 98.9 F (37.2 C)  Resp: 20    Intake/Output Summary (Last 24 hours) at 07/15/13 1344 Last data filed at  07/15/13 0630  Gross per 24 hour  Intake      0 ml  Output    3600 ml  Net  -3600 ml   Filed Weights   07/13/13 0425 07/14/13 0419 07/15/13 0746  Weight: 103.4 kg (227 lb 15.3 oz) 103.4 kg (227 lb 15.3 oz) 105.9 kg (233 lb 7.5 oz)    Exam:   General:  Eyes open. Comfortable.   Cardiovascular: RRR  Respiratory: CTAB anterior lung fields, no wheezes on ausculatation  Abdomen: Soft/NT/ND/+BS  Musculoskeletal: No clubbing Neuro: Patient is alert. But not following commands ,  Data Reviewed: Basic Metabolic Panel:  Recent Labs Lab 07/09/13 0340 07/10/13 0515 07/11/13 0630 07/12/13 0505 07/13/13 0444 07/14/13 0510 07/15/13 0525  NA 144 136 139 140 138 140 139  K 3.6 3.0* 4.1 4.2 4.4 4.1 4.2  CL 101 97 105 106 106 106 103  CO2 34* 31 27 26 27 29 28   GLUCOSE 261* 202* 106* 250* 181* 137* 155*  BUN 14 15 14 11 11 10 9   CREATININE 0.82 0.71 0.68 0.64 0.61 0.54 0.50  CALCIUM 7.5* 7.2* 7.8* 8.1* 8.3* 8.5 8.5  MG 1.5 1.5 1.6 1.7  --   --   --   PHOS 2.1* 2.3  2.3 3.6 3.6 4.0 4.2 4.1   Liver Function Tests:  Recent Labs Lab 07/11/13 0630 07/12/13 0505 07/13/13 0444 07/14/13 0510 07/15/13 0525  ALBUMIN 2.2* 2.2* 2.1* 2.2* 2.3*   No results found for this basename: LIPASE, AMYLASE,  in the last 168 hours No results found for this basename: AMMONIA,  in the last 168 hours CBC:  Recent Labs Lab 07/09/13 0340 07/10/13 0515 07/11/13 0630 07/12/13 0505 07/14/13 0510  WBC 6.4 7.5 5.7 6.2 7.2  NEUTROABS  --   --  3.5  --   --   HGB 12.6 12.0 11.2* 10.6* 9.8*  HCT 38.2 35.2* 33.6* 32.2* 29.7*  MCV 92.9 90.7 92.3 93.6 94.6  PLT 112* 73* 62* 59* 73*   Cardiac Enzymes: No results found for this basename: CKTOTAL, CKMB, CKMBINDEX, TROPONINI,  in the last 168 hours BNP (last 3 results) No results found for this basename: PROBNP,  in the last 8760 hours CBG:  Recent Labs Lab 07/14/13 2103 07/15/13 0026 07/15/13 0431 07/15/13 0832 07/15/13 1134  GLUCAP 229* 127* 151* 137* 147*    Recent Results (from the past 240  hour(s))  CULTURE, BLOOD (ROUTINE X 2)     Status: None   Collection Time    07/05/13 11:20 PM      Result Value Range Status   Specimen Description BLOOD LEFT HAND   Final   Special Requests BOTTLES DRAWN AEROBIC AND ANAEROBIC 3CC   Final   Culture  Setup Time     Final   Value: 07/06/2013 04:42     Performed at [facility]   Culture     Final   Value: NO GROWTH 5 DAYS     Performed at [facility]   Report Status 07/12/2013 FINAL   Final  CULTURE, BLOOD (ROUTINE X 2)     Status: None   Collection Time    07/05/13 11:24 PM      Result Value Range Status   Specimen Description BLOOD RIGHT ANTECUBITAL   Final   Special Requests BOTTLES DRAWN AEROBIC AND ANAEROBIC 5CC   Final   Culture  Setup Time     Final   Value: 07/06/2013 04:42     Performed  at [facility]   Culture     Final   Value: NO GROWTH 5 DAYS     Performed at [facility]   Report Status 07/12/2013 FINAL   Final  URINE CULTURE     Status: None   Collection Time    07/06/13  8:01 AM      Result Value Range Status   Specimen Description URINE, CATHETERIZED   Final   Special Requests NONE   Final   Culture  Setup Time     Final   Value: 07/06/2013 10:49     Performed at [facility] Count     Final   Value: 40,000 COLONIES/ML     Performed at [facility]   Culture     Final   Value: YEAST     Performed at [facility]   Report Status 07/07/2013 FINAL   Final  CULTURE, BLOOD (SINGLE)     Status: None   Collection Time    07/06/13  8:45 AM      Result Value Range Status   Specimen Description BLOOD RIGHT ARM   Final   Special Requests BOTTLES DRAWN AEROBIC ONLY 3CC    Final   Culture  Setup Time     Final   Value: 07/06/2013 10:49     Performed at [facility]   Culture     Final   Value: NO GROWTH 5 DAYS     Performed at [facility]   Report Status 07/12/2013 FINAL   Final  MRSA PCR SCREENING     Status: None    Collection Time    07/06/13  9:25 AM      Result Value Range Status   MRSA by PCR NEGATIVE  NEGATIVE Final   Comment:            The GeneXpert MRSA Assay (FDA     approved for NASAL specimens     only), is one component of a     comprehensive MRSA colonization     surveillance program. It is not     intended to diagnose MRSA     infection nor to guide or     monitor treatment for     MRSA infections.  GRAM STAIN     Status: None   Collection Time    07/07/13  5:40 PM      Result Value Range Status   Specimen Description CSF CSF   Final   Special Requests NONE   Final   Gram Stain     Final   Value: NO ORGANISMS SEEN     NO WBC SEEN     Gram Stain Report Called to,Read Back By and Verified With: AYERS,C. AT 1958 ON 09.26.14 BY LOVE,T.   Report Status 07/07/2013 FINAL   Final  BODY FLUID CULTURE     Status: None   Collection Time    07/07/13  5:42 PM      Result Value Range Status   Specimen Description Body Fluid   Final   Special Requests NONE   Final   Gram Stain     Final   Value: NO WBC SEEN     NO ORGANISMS SEEN     Performed at [facility]   Culture     Final   Value: NO GROWTH 3 DAYS     Performed at [facility]   Report Status 07/11/2013  FINAL   Final     Studies: Mr Brain Wo Contrast  07/14/2013   CLINICAL DATA:  Altered mental status.  Intermittent seizures.  EXAM: MRI HEAD WITHOUT CONTRAST  TECHNIQUE: Multiplanar, multisequence MR imaging was performed. No intravenous contrast was administered.  COMPARISON:  07/06/2013.  06/08/2013.  FINDINGS: Diffusion imaging shows a punctate acute infarction along the surface of a left parietal gyrus at the vertex. No other acute infarction. Chronic ischemic changes for seen within the pons. There are old small vessel cerebellar infarctions. The old infarction in the right thalamus is evident. Old lacunar infarctions in the basal ganglia and chronic small vessel changes throughout the deep white matter remain  evident. No large vessel territory infarction. No mass lesion, hemorrhage, hydrocephalus or extra-axial collection. No pituitary mass. No inflammatory sinus disease.  IMPRESSION: Newly seen punctate acute infarction along the surface of a left parietal gyrus at the vertex.  Old ischemic changes elsewhere throughout the brain as outlined above.   Electronically Signed   By: Paulina Fusi M.D.   On: 07/14/2013 07:26    Scheduled Meds: . amLODipine  10 mg Per NG tube Daily  . antiseptic oral rinse  15 mL Mouth Rinse q12n4p  . carvedilol  12.5 mg Per NG tube BID WC  . chlorhexidine  15 mL Mouth Rinse BID  . insulin aspart  0-20 Units Subcutaneous Q4H  . insulin detemir  50 Units Subcutaneous BID  . latanoprost  1 drop Both Eyes QHS  . levETIRAcetam  500 mg Intravenous Q12H  . levothyroxine  75 mcg Per NG tube QAC breakfast  . liothyronine  25 mcg Per NG tube BID  . saccharomyces boulardii  250 mg Oral BID  . sodium chloride  10-40 mL Intracatheter Q12H  . sodium chloride  3 mL Intravenous Q12H  . vancomycin  125 mg Per Tube Q6H   Continuous Infusions: . feeding supplement (JEVITY 1.2 CAL) 1,000 mL (07/15/13 1120)  . sodium chloride 0.9 % 1,000 mL with potassium chloride 20 mEq infusion 100 mL/hr at 07/15/13 1610    Principal Problem:   Acute encephalopathy Active Problems:   HYPOTHYROIDISM   DIABETES MELLITUS, TYPE II   HYPERLIPIDEMIA   UTI (urinary tract infection)   HTN (hypertension)   Anxiety and depression   CVA (cerebral infarction)   Weakness generalized   Dehydration   Acute renal failure   Failure to thrive   Diarrhea   Hyponatremia   Metabolic acidosis   Clostridium difficile colitis   Hypomagnesemia   Hypernatremia   Seizures   Meningitis, unspecified(322.9)   Protein-calorie malnutrition, severe   Thrombocytopenia, unspecified    Time spent: > 35 mins    Regina [name]  [facility] Pager [phone] If 7PM-7AM, please contact night-coverage at  [email or web address], password [facility] 07/15/2013, 1:44 PM  LOS: 14 days

## 2013-07-15 NOTE — Consult Note (Signed)
Patient WU:[id number] JASMINEMARIE [name]      DOB: 22-Jul-1941      JYN:[id number]   Summary of Goals of Care; full note to follow:  Met with Daughter, Nephew and Niece( Regina [name], Regina [name] and Regina [name])  Family expressed that Regina has had a rough few months.  At baseline she was taking care of herself at home, cooking some doing occasional shopping at [facility], reading with a magnifying glass due to decreased vision.  She did not drive but was able to fix little meals etc. She had a change in August and was brought to the hospital where she was discovered to have a stroke. She was also admitted and discharged for DKA. She subsequently was sent for Rehab to [facility] but developed diarrhea and vomiting and had a marked cognitive decline.  Since admission she has had encephalopathy of unclear etiology which is not her baseline and was worked up and treated for meningitis.  She was transferred to [facility] yesterday for 24 hour EEG monitoring as she was thought to be having seizures. MRI was repeated showing small left parietal area consistent with likely stroke but this would not explain the decreased level of consciousness.  At this time family would like to try to restore her to here previous baseline of functionality if possible.  They did affirm a DNR status which should reflect no CPR/ no Vent support.  They may wish to use antiarrythmic or vasopressors if reasonable recovery could be projected (ie. Not ACLS use but related to infection or afib etc.).  Further goals can be elicited as more information becomes available.  Case discussed with Dr. Amada Jupiter.  Recommend:  1.  Adjust Code status to reflect use of antiarrythmics and vasopressors, but no CPR/ Intubation  2.  Feeding:  Would request work with speech to see if diet could be instituted as she becomes more awake and alert.  Currently , has a panda in place.  3. Ask PT to work with patient.   Total time:  915 am 1020 am   Jacarius Handel L. Ladona Ridgel, MD MBA The  Palliative Medicine Team at [facility] Phone: [phone] Pager: [phone]

## 2013-07-16 DIAGNOSIS — I6350 Cerebral infarction due to unspecified occlusion or stenosis of unspecified cerebral artery: Secondary | ICD-10-CM

## 2013-07-16 DIAGNOSIS — D696 Thrombocytopenia, unspecified: Secondary | ICD-10-CM

## 2013-07-16 DIAGNOSIS — R569 Unspecified convulsions: Secondary | ICD-10-CM

## 2013-07-16 DIAGNOSIS — G9340 Encephalopathy, unspecified: Secondary | ICD-10-CM

## 2013-07-16 DIAGNOSIS — G039 Meningitis, unspecified: Secondary | ICD-10-CM

## 2013-07-16 LAB — CBC
MCH: 32 pg (ref 26.0–34.0)
MCHC: 34.3 g/dL (ref 30.0–36.0)
MCV: 93.2 fL (ref 78.0–100.0)
Platelets: 116 10*3/uL — ABNORMAL LOW (ref 150–400)
RDW: 15.8 % — ABNORMAL HIGH (ref 11.5–15.5)

## 2013-07-16 LAB — GLUCOSE, CAPILLARY
Glucose-Capillary: 137 mg/dL — ABNORMAL HIGH (ref 70–99)
Glucose-Capillary: 147 mg/dL — ABNORMAL HIGH (ref 70–99)
Glucose-Capillary: 165 mg/dL — ABNORMAL HIGH (ref 70–99)
Glucose-Capillary: 166 mg/dL — ABNORMAL HIGH (ref 70–99)
Glucose-Capillary: 185 mg/dL — ABNORMAL HIGH (ref 70–99)

## 2013-07-16 LAB — RENAL FUNCTION PANEL
Albumin: 2 g/dL — ABNORMAL LOW (ref 3.5–5.2)
Chloride: 106 mEq/L (ref 96–112)
GFR calc Af Amer: >90 mL/min (ref >90)
Phosphorus: 4.3 mg/dL (ref 2.3–4.6)
Potassium: 4.3 mEq/L (ref 3.5–5.1)
Sodium: 141 mEq/L (ref 135–145)

## 2013-07-16 LAB — MAGNESIUM: Magnesium: 1.5 mg/dL (ref 1.5–2.5)

## 2013-07-16 MED ORDER — MAGNESIUM SULFATE IN D5W 10-5 MG/ML-% IV SOLN
1.0000 g | Freq: Once | INTRAVENOUS | Status: AC
  Administered 2013-07-16: 1 g via INTRAVENOUS
  Filled 2013-07-16: qty 100

## 2013-07-16 MED ORDER — CYANOCOBALAMIN 1000 MCG/ML IJ SOLN
1000.0000 ug | Freq: Once | INTRAMUSCULAR | Status: AC
  Administered 2013-07-16: 1000 ug via INTRAMUSCULAR
  Filled 2013-07-16: qty 1

## 2013-07-16 NOTE — Procedures (Signed)
History:  This was a continuous 18 channel EEG recorded from 07/14/2013 2:26 pm until 07/15/2013 12:52pm.  Background: There is irregular delta superimposed on a more normal mix of alpha and beta activities. There is occasionally a posterior dominant rhythm visualized which is moderately sustained, achieving a maximal frequency of 7.5 Hz There are recurrent bursts of bifrontal rhythmic delta activity with some suggestion of evolution. It does have some of the appearance of frontal rhythmic intermittent delta activity, though at times with a slight left preponderance. This is seen recurrently around 5pm over the course of 15-20 mniutes, then again shortly after 12:30pm shortly following an arousal again lasting about 15 minutes of intermittent recurrent activity. Then again around 1:54 am for just a single brief run. Over the course of the remainder of the recording, this pattern would intermittently be seen, lasting for 5 - 10 minutes with several hours before a recurrence.   EEG Abnormalities: 1) recurrent runs of delta activity resembling FIRDA, but with some atypical features.  2) Generalized irregular slow activity.  3) Slow PDR  Clinical Interpretation: This EEG is consistent with a  Mild non-specific generalized cerebral dysfunction(encephalopathy). I suspect that the recurrent bursts of activity dscribed above are associated with drowsiness and/or encephalopathy, but there is a small chance that they could represent recurrent ictal discharges.   Ritta Slot, MD [facility] [phone]  If 7pm- 7am, please page neurology on call at [phone].

## 2013-07-16 NOTE — Progress Notes (Signed)
Subjective: Daughter reports that she is more alert today.   Exam: Filed Vitals:   07/16/13 1000  BP: 150/71  Pulse: 96  Temp: 97.5 F (36.4 C)  Resp: 18   Gen: In bed, NAD MS: Awakens easily, is able to answer simple questions such as who is in the room with her. She still has some signs of left-sided neglect CN: Pupils equal round and reactive, extraocular movements intact, visual fields full (able to count fingers in all fields) Motor: Moving both arms equally, though an*to do something with her left arm she sometimes will use her right. Sensory: Reports sensation in all 4 extremities  EEG shows some bursts of delta which I suspect are related to drowsiness/encephalopa  Impression: 72 year old female with acute encephalopathy that started in the setting of high fever and neck rigidity. Though she was treated with antibiotics for 2 days prior LP, if she had had a bacterial meningitis, I would have expected continued inflammation, though this does remain a possibility. The stroke seen on the recent MRI is tiny, and I would not expect it to be causing her current mental status changes.   Her left-sided neglect could be due to her right thalamic infarct, and this is really her major focal finding. I suspect that the activity on EEG is non-ictal, though may consider being more aggressive with seizure medicines if she does not continue to improve  Recommendations: 1) will continue to monitor  Ritta Slot, MD [facility] [phone]  If 7pm- 7am, please page neurology on call at [phone].

## 2013-07-16 NOTE — Progress Notes (Signed)
[facility] PROGRESS NOTE  Regina [name] ZOX:[id number] DOB: Jan 02, 1941 DOA: 07/01/2013 PCP: Michiel Sites, MD Brief HPI: 72 yo female admitted with generalized weakness, and found to have UTI, diarrhea and acute renal failure. Found to have C diff. 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  Assessment/Plan: #1 acute encephalopathy Questionable etiology. Clinical improvement. Patient alert,but not following commands. May be secondary to seizures in the setting of CVA.  - Neurology currently on board and making further recommendations.  - Of note patient has a low vitamin B 12 level.  Will plan on replacing   #2 probable seizure EEG completed and official report pending. Per neurology.  #3 possible meningitis/CSF infection  Patient noted to have a rectal temperature 102.9  5 days ago. Fever curve trending down. Patient had significant nuchal rigidity. Patient with altered mental status, but improving daily. Blood cultures are pending. Patient s/p LP .   - ID on board and currently has discontinued antiviral medication and antibiotics based on low index of suspicion of infectious etiology as cause of presenting symptoms. Neurology on board and currently investigating for vasculitis. - WBC within normal limits  #4 acute renal failure Likely secondary to volume depletion and hypotension in the setting of ARB. Clinical improvement.  Arb and metformin held - Resolved.  #5 hypokalemia/hypomagnesemia Likely secondary to GI losses from C. difficile colitis and increased urinary output. Replete.Keep magnesium at 2. Today's magnesium level at 1.5  #6 C. difficile colitis Continue oral vancomycin to per panda. Follow.  #7 hypernatremia Sodium levels improved and now at 139. Decrease IVF. follow.   #8 Escherichia coli UTI Patient had received IV Rocephin x3 days.   #9 hypertension D/C IV Lopressor. Resume home dose coreg and norvasc 5 mg daily. - continue to monitor and  adjust antihypertensive medication as needed.  #10 history of CVA Aggrenox held for lumbar puncture 2 days ago. Patient alert, however platelets 62. Will defer to neurology, when to resume.  #11 diabetes mellitus Hemoglobin A1c is 9.6. CBGs have ranged from 114-161. Decadron d/c'd. Continue Levemir 45 units subcutaneous twice a day. Continue sliding scale insulin. CBG (last 3)   Recent Labs  07/16/13 0012 07/16/13 0424 07/16/13 0815  GLUCAP 166* 146* 147*     #12 hypothyroidism Continue Synthroid and Cytomel.  #13 anxiety/depression Stable.  #14 Nuitrition Placed panda tube 9/29. On TF. SLP for eval.Patient with diarrhea which started after TF.   #15. Thrombocytopenia Likely secondary to infection. No bleeding noted. Hold lovenox. Follow.  #16 prophylaxis  Pepcid for GI prophylaxis. SCDs for DVT prophylaxis.   Code Status: DNR Family Communication: none at bedside.  Disposition Plan: pending palliative care consult and further recommendations from Neurology   Consultants:  Nephrology: Dr. Eliott Nine 07/02/2013:  Neurology : Dr Thad Ranger 07/06/13  ID: Dr Luciana Axe 07/09/13  Procedures: MRI brain 8/27 >> small acute lacunar infarct in medial Rt thalamus, ? Punctate acute lacunar infarct central pons CT head 07/05/2013  KUB 07/05/2013  KUB 07/01/2013  Chest x-ray 07/01/2013  MRI head 07/06/13  EEG 07/06/13  LP 07/07/13  PICC line 07/09/13  Antibiotics:  IV Rocephin 07/01/2013--> 07/05/2013  IV Flagyl 07/04/2013-->07/10/13  Oral flagyl 07/10/13-9/30  IV vancomycin 07/06/2013-9/30  IV Rocephin 07/06/2013- 9/30  IV ampicillin 07/06/2013--->07/09/13  IV Acyclovir 07/06/13- 9/30  Oral vancomycin 9/30  HPI/Subjective: No acute issues reported overnight.  Objective: Filed Vitals:   07/16/13 0532  BP: 135/76  Pulse: 83  Temp: 98 F (36.7 C)  Resp: 20  Intake/Output Summary (Last 24 hours) at 07/16/13 1000 Last data filed at 07/16/13 0916  Gross per  24 hour  Intake     30 ml  Output   1400 ml  Net  -1370 ml   Filed Weights   07/14/13 0419 07/15/13 0746 07/16/13 0500  Weight: 103.4 kg (227 lb 15.3 oz) 105.9 kg (233 lb 7.5 oz) 105 kg (231 lb 7.7 oz)    Exam:   General:  Eyes open. Comfortable.   Cardiovascular: RRR  Respiratory: CTAB anterior lung fields, no wheezes on ausculatation  Abdomen: Soft/NT/ND/+BS  Musculoskeletal: No clubbing Neuro: Patient is alert. But not following commands ,  Data Reviewed: Basic Metabolic Panel:  Recent Labs Lab 07/10/13 0515 07/11/13 0630 07/12/13 0505 07/13/13 0444 07/14/13 0510 07/15/13 0525 07/16/13 0500  NA 136 139 140 138 140 139 141  K 3.0* 4.1 4.2 4.4 4.1 4.2 4.3  CL 97 105 106 106 106 103 106  CO2 31 27 26 27 29 28 29   GLUCOSE 202* 106* 250* 181* 137* 155* 139*  BUN 15 14 11 11 10 9 10   CREATININE 0.71 0.68 0.64 0.61 0.54 0.50 0.51  CALCIUM 7.2* 7.8* 8.1* 8.3* 8.5 8.5 8.2*  MG 1.5 1.6 1.7  --   --   --  1.5  PHOS 2.3  2.3 3.6 3.6 4.0 4.2 4.1 4.3   Liver Function Tests:  Recent Labs Lab 07/12/13 0505 07/13/13 0444 07/14/13 0510 07/15/13 0525 07/16/13 0500  ALBUMIN 2.2* 2.1* 2.2* 2.3* 2.0*   No results found for this basename: LIPASE, AMYLASE,  in the last 168 hours No results found for this basename: AMMONIA,  in the last 168 hours CBC:  Recent Labs Lab 07/10/13 0515 07/11/13 0630 07/12/13 0505 07/14/13 0510 07/16/13 0500  WBC 7.5 5.7 6.2 7.2 6.0  NEUTROABS  --  3.5  --   --   --   HGB 12.0 11.2* 10.6* 9.8* 9.4*  HCT 35.2* 33.6* 32.2* 29.7* 27.4*  MCV 90.7 92.3 93.6 94.6 93.2  PLT 73* 62* 59* 73* 116*   Cardiac Enzymes: No results found for this basename: CKTOTAL, CKMB, CKMBINDEX, TROPONINI,  in the last 168 hours BNP (last 3 results) No results found for this basename: PROBNP,  in the last 8760 hours CBG:  Recent Labs Lab 07/15/13 1637 07/15/13 2003 07/16/13 0012 07/16/13 0424 07/16/13 0815  GLUCAP 133* 156* 166* 146* 147*     Recent Results (from the past 240 hour(s))  GRAM STAIN     Status: None   Collection Time    07/07/13  5:40 PM      Result Value Range Status   Specimen Description CSF CSF   Final   Special Requests NONE   Final   Gram Stain     Final   Value: NO ORGANISMS SEEN     NO WBC SEEN     Gram Stain Report Called to,Read Back By and Verified With: AYERS,C. AT 1958 ON 09.26.14 BY LOVE,T.   Report Status 07/07/2013 FINAL   Final  BODY FLUID CULTURE     Status: None   Collection Time    07/07/13  5:42 PM      Result Value Range Status   Specimen Description Body Fluid   Final   Special Requests NONE   Final   Gram Stain     Final   Value: NO WBC SEEN     NO ORGANISMS SEEN     Performed at [facility]  Culture     Final   Value: NO GROWTH 3 DAYS     Performed at [facility]   Report Status 07/11/2013 FINAL   Final     Studies: Ct Angio Head W/cm &/or Wo Cm  07/15/2013   CLINICAL DATA:  Acute encephalopathy in the setting of high fever. Evaluate for cause of punctate acute infarction left parietal gyrus discovered 07/13/2013. Stroke risk factors include diabetes mellitus, and hypertension.  EXAM: CT ANGIOGRAPHY HEAD AND NECK  TECHNIQUE: Multidetector CT imaging of the head and neck was performed using the standard protocol during bolus administration of intravenous contrast. Multiplanar CT image reconstructions including MIPs were obtained to evaluate the vascular anatomy. Carotid stenosis measurements (when applicable) are obtained utilizing NASCET criteria, using the distal internal carotid diameter as the denominator.  CONTRAST:  50mL OMNIPAQUE IOHEXOL 350 MG/ML SOLN  COMPARISON:  MR brain 07/13/2013.  MRA head 06/09/2013.  FINDINGS: CTA HEAD FINDINGS  No visible acute large vessel infarct. Punctate infarct observed on MRI are not visible. No acute hemorrhage, mass lesion, hydrocephalus, or extra-axial fluid. Global atrophy with chronic microvascular ischemic change. No  abnormal postcontrast enhancement. Internal carotid arteries widely patent. Basilar artery widely patent with right vertebral dominant. Critical stenosis distal left vertebral, greater than 90% does not involve the PICA, and is of doubtful significance given the dominant right vertebral  Small A1 ACA on the left. Robust right A1 ACA is dominant. Bilateral 50-75% pericallosal ACA stenoses.  50% stenosis M1 segment right middle cerebral artery. No left M1 MCA stenosis. Mild irregularity distal MCA and PCA branches consistent with intracranial atherosclerotic change but no distal branch occlusion.  90% stenosis origin P1 left PCA (image 18 series [id number]), also moderate irregularity left greater than right P2 PCA segments.  No cerebellar branch occlusion.  Review of the MIP images confirms the above findings.  CTA NECK FINDINGS  Transverse arch atheromatous change without ulceration. Dolichoectasia great vessels without proximal stenosis. No measurable carotid stenosis with mild non stenotic predominantly calcific plaque bilaterally. No evidence for ICA dissection or fibromuscular dysplasia. Both vertebral arteries are patent from their origins through the neck with right dominant. No ostial stenosis.  Mild cervical spondylosis. No neck masses. Mild vascular congestion in the chest. No adenopathy. Nasogastric tube. No sinus or mastoid disease.  Review of the MIP images confirms the above findings.  IMPRESSION: CTA HEAD IMPRESSION  90% stenosis P1 segment left PCA could correlate with the observed pattern of infarction.  Critical stenosis distal left vertebral of doubtful significance, as the right vertebral is dominant.  No flow-limiting stenosis of the internal carotid arteries, basilar artery, or proximal ACA or MCA branches.  CTA NECK IMPRESSION  No extracranial flow reducing lesion is identified. Mild non stenotic atheromatous change at the bifurcations. No evidence for vertebral artery compromise. No extracranial  dissection.   Electronically Signed   By: Davonna Belling M.D.   On: 07/15/2013 16:37   Ct Angio Neck W/cm &/or Wo/cm  07/15/2013   CLINICAL DATA:  Acute encephalopathy in the setting of high fever. Evaluate for cause of punctate acute infarction left parietal gyrus discovered 07/13/2013. Stroke risk factors include diabetes mellitus, and hypertension.  EXAM: CT ANGIOGRAPHY HEAD AND NECK  TECHNIQUE: Multidetector CT imaging of the head and neck was performed using the standard protocol during bolus administration of intravenous contrast. Multiplanar CT image reconstructions including MIPs were obtained to evaluate the vascular anatomy. Carotid stenosis measurements (when applicable) are obtained utilizing NASCET criteria, using  the distal internal carotid diameter as the denominator.  CONTRAST:  50mL OMNIPAQUE IOHEXOL 350 MG/ML SOLN  COMPARISON:  MR brain 07/13/2013.  MRA head 06/09/2013.  FINDINGS: CTA HEAD FINDINGS  No visible acute large vessel infarct. Punctate infarct observed on MRI are not visible. No acute hemorrhage, mass lesion, hydrocephalus, or extra-axial fluid. Global atrophy with chronic microvascular ischemic change. No abnormal postcontrast enhancement. Internal carotid arteries widely patent. Basilar artery widely patent with right vertebral dominant. Critical stenosis distal left vertebral, greater than 90% does not involve the PICA, and is of doubtful significance given the dominant right vertebral  Small A1 ACA on the left. Robust right A1 ACA is dominant. Bilateral 50-75% pericallosal ACA stenoses.  50% stenosis M1 segment right middle cerebral artery. No left M1 MCA stenosis. Mild irregularity distal MCA and PCA branches consistent with intracranial atherosclerotic change but no distal branch occlusion.  90% stenosis origin P1 left PCA (image 18 series [id number]), also moderate irregularity left greater than right P2 PCA segments.  No cerebellar branch occlusion.  Review of the MIP images confirms  the above findings.  CTA NECK FINDINGS  Transverse arch atheromatous change without ulceration. Dolichoectasia great vessels without proximal stenosis. No measurable carotid stenosis with mild non stenotic predominantly calcific plaque bilaterally. No evidence for ICA dissection or fibromuscular dysplasia. Both vertebral arteries are patent from their origins through the neck with right dominant. No ostial stenosis.  Mild cervical spondylosis. No neck masses. Mild vascular congestion in the chest. No adenopathy. Nasogastric tube. No sinus or mastoid disease.  Review of the MIP images confirms the above findings.  IMPRESSION: CTA HEAD IMPRESSION  90% stenosis P1 segment left PCA could correlate with the observed pattern of infarction.  Critical stenosis distal left vertebral of doubtful significance, as the right vertebral is dominant.  No flow-limiting stenosis of the internal carotid arteries, basilar artery, or proximal ACA or MCA branches.  CTA NECK IMPRESSION  No extracranial flow reducing lesion is identified. Mild non stenotic atheromatous change at the bifurcations. No evidence for vertebral artery compromise. No extracranial dissection.   Electronically Signed   By: Davonna Belling M.D.   On: 07/15/2013 16:37    Scheduled Meds: . alteplase  2 mg Intracatheter Once  . amLODipine  10 mg Per NG tube Daily  . antiseptic oral rinse  15 mL Mouth Rinse q12n4p  . carvedilol  12.5 mg Per NG tube BID WC  . chlorhexidine  15 mL Mouth Rinse BID  . insulin aspart  0-20 Units Subcutaneous Q4H  . insulin detemir  50 Units Subcutaneous BID  . latanoprost  1 drop Both Eyes QHS  . levETIRAcetam  500 mg Intravenous Q12H  . levothyroxine  75 mcg Per NG tube QAC breakfast  . liothyronine  25 mcg Per NG tube BID  . saccharomyces boulardii  250 mg Oral BID  . sodium chloride  10-40 mL Intracatheter Q12H  . sodium chloride  3 mL Intravenous Q12H  . vancomycin  125 mg Per Tube Q6H   Continuous Infusions: . feeding  supplement (JEVITY 1.2 CAL) 1,000 mL (07/16/13 0620)  . sodium chloride 0.9 % 1,000 mL with potassium chloride 20 mEq infusion 100 mL/hr at 07/16/13 4132    Principal Problem:   Acute encephalopathy Active Problems:   HYPOTHYROIDISM   DIABETES MELLITUS, TYPE II   HYPERLIPIDEMIA   UTI (urinary tract infection)   HTN (hypertension)   Anxiety and depression   CVA (cerebral infarction)   Weakness generalized  Dehydration   Acute renal failure   Failure to thrive   Diarrhea   Hyponatremia   Metabolic acidosis   Clostridium difficile colitis   Hypomagnesemia   Hypernatremia   Seizures   Meningitis, unspecified(322.9)   Protein-calorie malnutrition, severe   Thrombocytopenia, unspecified    Time spent: > 35 mins    Penny Pia  [facility] Pager [phone] If 7PM-7AM, please contact night-coverage at [email or web address], password [email or web address] 07/16/2013, 10:00 AM  LOS: 15 days

## 2013-07-17 DIAGNOSIS — Z515 Encounter for palliative care: Secondary | ICD-10-CM

## 2013-07-17 DIAGNOSIS — F329 Major depressive disorder, single episode, unspecified: Secondary | ICD-10-CM

## 2013-07-17 LAB — RENAL FUNCTION PANEL
Albumin: 2.2 g/dL — ABNORMAL LOW (ref 3.5–5.2)
BUN: 10 mg/dL (ref 6–23)
CO2: 32 mEq/L (ref 19–32)
Chloride: 100 mEq/L (ref 96–112)
GFR calc Af Amer: >90 mL/min (ref >90)
Glucose, Bld: 128 mg/dL — ABNORMAL HIGH (ref 70–99)
Potassium: 4.2 mEq/L (ref 3.5–5.1)
Sodium: 137 mEq/L (ref 135–145)

## 2013-07-17 LAB — GLUCOSE, CAPILLARY
Glucose-Capillary: 119 mg/dL — ABNORMAL HIGH (ref 70–99)
Glucose-Capillary: 134 mg/dL — ABNORMAL HIGH (ref 70–99)
Glucose-Capillary: 157 mg/dL — ABNORMAL HIGH (ref 70–99)
Glucose-Capillary: 236 mg/dL — ABNORMAL HIGH (ref 70–99)
Glucose-Capillary: 272 mg/dL — ABNORMAL HIGH (ref 70–99)

## 2013-07-17 MED ORDER — SODIUM CHLORIDE 0.9 % IV SOLN
1000.0000 mg | Freq: Once | INTRAVENOUS | Status: DC
  Filled 2013-07-17: qty 10

## 2013-07-17 MED ORDER — ENSURE COMPLETE PO LIQD
237.0000 mL | Freq: Two times a day (BID) | ORAL | Status: DC
  Administered 2013-07-18 – 2013-07-22 (×8): 237 mL via ORAL

## 2013-07-17 MED ORDER — SODIUM CHLORIDE 0.9 % IV SOLN
1000.0000 mg | Freq: Two times a day (BID) | INTRAVENOUS | Status: DC
  Administered 2013-07-18 – 2013-07-20 (×5): 1000 mg via INTRAVENOUS
  Filled 2013-07-17 (×10): qty 10

## 2013-07-17 NOTE — Progress Notes (Signed)
Occupational Therapy Treatment Patient Details Name: Regina [name] MRN: [id number] DOB: [birth year] Today's Date: 07/17/2013 Time: 5621-3086 OT Time Calculation (min): 25 min  OT Assessment / Plan / Recommendation  History of present illness 72 y.o. female has had a complicated medical course over the last 1 month. Patient is unable to provide history due to weakness and? Dementia. History is provided by patient's daughter Regina [name] who is at the bedside. 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 She was recently admitted twice at [facility]-06/04/13-06/06/13 (DKA) & 06/07/13-06/09/13 (metabolic encephalopathy, uncontrolled DM and CVA). Following the last discharge, patient was sent to rehabilitation where she was for approximately 3 weeks. Patient has had a sustained rapid decline at SNF. 2 days after going to SNF, she developed profuse diarrhea of 9 days duration, extremely poor appetite, lethargy, laying in bed and unwilling to participate in therapies, decreased urine output and progressive generalized weakness.    OT comments  Pt with improved ability to follow commands and participate.  Ideational apraxia noted.  Pt attending to Lt. And using Lt. UE spontaneously during treatment session.  Co-treat with PT as pt required +3 total A for sit to stand and transfer to chair.   Follow Up Recommendations  SNF;Supervision/Assistance - 24 hour    Barriers to Discharge       Equipment Recommendations  None recommended by OT    Recommendations for Other Services    Frequency Min 2X/week   Progress towards OT Goals Progress towards OT goals: Progressing toward goals  Plan Discharge plan remains appropriate    Precautions / Restrictions Precautions Precautions: Fall Restrictions Weight Bearing Restrictions: No   Pertinent Vitals/Pain     ADL  Eating/Feeding: NPO Grooming: Brushing  hair;+1 Total assistance Where Assessed - Grooming: Unsupported sitting Toilet Transfer: +2 Total assistance Toilet Transfer: Patient Percentage: 20% Toilet Transfer Method: Squat pivot Toilet Transfer Equipment:  (recliner) Transfers/Ambulation Related to ADLs: +3 total A to transfer to recliner (pt ~20%) ADL Comments: Pt demonstrates ideational apraxia with use of comb and applying lotion to hands.  Pt spontaneously reaching for items with Lt. UE and combs hair with total A  hand over hand due to apraxia.  Pt combed face and chin, and unable to correcly orient comb to hair/head when comb brought to her head for appropriate use.  When asked to apply lotion to her hands, she rubbed the lotion bottle against her hands    OT Diagnosis:    OT Problem List:   OT Treatment Interventions:     OT Goals(current goals can now be found in the care plan section) Acute Rehab OT Goals Time For Goal Achievement: 07/26/13 Potential to Achieve Goals: Good ADL Goals Pt Will Perform Eating: with min assist;sitting Pt Will Perform Grooming: with mod assist;sitting Pt Will Perform Upper Body Bathing: with min assist;sitting Pt Will Perform Lower Body Bathing: with mod assist;sitting/lateral leans;sit to/from stand Pt Will Transfer to Toilet: with mod assist;stand pivot transfer;bedside commode Pt Will Perform Toileting - Clothing Manipulation and hygiene: with mod assist;sit to/from stand Pt/caregiver will Perform Home Exercise Program: Both right and left upper extremity;Increased ROM;Increased strength;With minimal assist Additional ADL Goal #1: Pt tolerate supine to EOB with max assist in prep for ADL  Visit Information  Last OT Received On: 07/17/13 Assistance Needed: +3 or more PT/OT Co-Evaluation/Treatment: Yes History of Present Illness: 72 y.o. female has had a complicated medical course over  the last 1 month. Patient is unable to provide history due to weakness and? Dementia. History is provided  by patient's daughter Regina [name] who is at the bedside. 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 She was recently admitted twice at [facility]-06/04/13-06/06/13 (DKA) & 06/07/13-06/09/13 (metabolic encephalopathy, uncontrolled DM and CVA). Following the last discharge, patient was sent to rehabilitation where she was for approximately 3 weeks. Patient has had a sustained rapid decline at SNF. 2 days after going to SNF, she developed profuse diarrhea of 9 days duration, extremely poor appetite, lethargy, laying in bed and unwilling to participate in therapies, decreased urine output and progressive generalized weakness.     Subjective Data      Prior Functioning       Cognition  Cognition Arousal/Alertness: Awake/alert Behavior During Therapy: Flat affect Overall Cognitive Status: Impaired/Different from baseline Area of Impairment: Orientation;Following commands;Problem solving;Attention Orientation Level: Disoriented to;Time;Situation Current Attention Level: Sustained (~30 seconds) Following Commands: Follows one step commands consistently Problem Solving: Slow processing    Mobility  Transfers Transfers: Sit to Stand;Stand to Sit Sit to Stand: 1: +2 Total assist;From bed;With upper extremity assist Sit to Stand: Patient Percentage: 20% Stand to Sit: 1: +2 Total assist;To chair/3-in-1;To bed;With upper extremity assist Stand to Sit: Patient Percentage: 20% Details for Transfer Assistance: Pt required +3 total A for sit to stand and transfer.  Pt unable to lift buttocks from surface, and unable to initiate moving feet for transfer    Exercises      Balance Balance Balance Assessed: Yes Static Sitting Balance Static Sitting - Balance Support: Right upper extremity supported;Feet supported Static Sitting - Level of Assistance: 5: Stand by assistance Static Sitting - Comment/# of  Minutes: Sitting EOB   End of Session OT - End of Session Equipment Utilized During Treatment: Gait belt Activity Tolerance: Patient tolerated treatment well Patient left: in chair;with call bell/phone within reach;with nursing/sitter in room Nurse Communication: Mobility status  GO     Alpha Mysliwiec M 07/17/2013, 1:19 PM

## 2013-07-17 NOTE — Progress Notes (Signed)
Speech Language Pathology Dysphagia Treatment Patient Details Name: Regina [name] MRN: [id number] DOB: 1941-02-03 Today's Date: 07/17/2013 Time: 4696-2952 SLP Time Calculation (min): 23 min  Assessment / Plan / Recommendation Clinical Impression  Pt demonstrated significant progress with SLP today. Cognitive deficits persist, but today awake, responsive to social langauge and cues for automaticity with self feeding.   SLP offered straw to lips initially with no appropriate response. Then offered straw used like a dropper to provide tactile cue of moisture with straw. Pt immediately closed lips and sipped small amount from straw. Then able to progress to hand over hand cup holding with pt initiated straw sips and cup sips. Pt also accepted trials of applesauce with appropirate oral manipulation and swallow response. With initial sip, vocal quality slightly wet, likely due to standing secretions. Pt responded with cues for a throat clear, from then on vocal quality remained dry.   Recommend pt begin dys 1 (puree) diet with thin liquids with strict precautions and use of strategies, posted over bed. Pt would likely not participate in MBS so aspiration cannot be ruled out; pt must be fed with care. If coughing, wet vocal quality observed, stop feeding. Suggest Panda remain in place until intake is consistent and sufficient for nutiritonal support. Discussed with Elgie Collard and RN.     Diet Recommendation  Initiate / Change Diet: Dysphagia 1 (puree);Thin liquid    SLP Plan Continue with current plan of care   Pertinent Vitals/Pain NA   Swallowing Goals  SLP Swallowing Goals Patient will utilize recommended strategies during swallow to increase swallowing safety with: Total assistance;Maximal cueing Swallow Study Goal #2 - Progress: Progressing toward goal  General Temperature Spikes Noted: No Respiratory Status: Supplemental O2 delivered via (comment) Behavior/Cognition: Alert;Confused Oral  Cavity - Dentition: Dentures, top Patient Positioning: Upright in bed  Oral Cavity - Oral Hygiene Does patient have any of the following "at risk" factors?: Oxygen therapy - cannula, mask, simple oxygen devices   Dysphagia Treatment Treatment focused on: Skilled observation of diet tolerance;Upgraded PO texture trials;Patient/family/caregiver education;Utilization of compensatory strategies Family/Caregiver Educated: daughter Treatment Methods/Modalities: Skilled observation;Differential diagnosis Patient observed directly with PO's: Yes Type of PO's observed: Dysphagia 1 (puree);Thin liquids Feeding: Needs assist Liquids provided via: Straw Pharyngeal Phase Signs & Symptoms: Wet vocal quality Type of cueing: Verbal;Tactile Amount of cueing: Maximal   GO    Harlon Ditty, MA CCC-SLP [phone]  Claudine Mouton 07/17/2013, 9:35 AM

## 2013-07-17 NOTE — Progress Notes (Signed)
Physical Therapy Treatment Patient Details Name: AAYRA HORNBAKER MRN: [id number] DOB: 1940/12/09 Today's Date: 07/17/2013 Time: 4098-1191 PT Time Calculation (min): 35 min  PT Assessment / Plan / Recommendation  History of Present Illness 72 y.o. female has had a complicated medical course over the last 1 month. Patient is unable to provide history due to weakness and? Dementia. History is provided by patient's daughter [name] who is at the bedside. 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 She was recently admitted twice at [facility]-06/04/13-06/06/13 (DKA) & 06/07/13-06/09/13 (metabolic encephalopathy, uncontrolled DM and CVA). Following the last discharge, patient was sent to rehabilitation where she was for approximately 3 weeks. Patient has had a sustained rapid decline at SNF. 2 days after going to SNF, she developed profuse diarrhea of 9 days duration, extremely poor appetite, lethargy, laying in bed and unwilling to participate in therapies, decreased urine output and progressive generalized weakness.    PT Comments   Patient with improvements in activity and ability to follow commands today. Patient performed there ex with minimal assistance and ques, patient able to sit EOB self supported without difficulty. Patient transferred to chair with increased assist. Closing portions of therapy co-treated with OT for +3 assist. Will continue to see as indicated and progress activity as tolerated.   Follow Up Recommendations  SNF;Supervision/Assistance - 24 hour           Equipment Recommendations  Other (comment) (TBD upon ambulation evaluation)    Recommendations for Other Services OT consult  Frequency Min 3X/week   Progress towards PT Goals Progress towards PT goals: Progressing toward goals  Plan Current plan remains appropriate    Precautions / Restrictions  Precautions Precautions: Fall Restrictions Weight Bearing Restrictions: No   Pertinent Vitals/Pain Patient denies pain at this time    Mobility  Bed Mobility Bed Mobility: Supine to Sit;Sitting - Scoot to Edge of Bed Supine to Sit: 1: +2 Total assist Supine to Sit: Patient Percentage: 30% Sitting - Scoot to Edge of Bed: 1: +2 Total assist Sitting - Scoot to Edge of Bed: Patient Percentage: 30% Details for Bed Mobility Assistance: Improvements in mobility today, patient able to use UE to pull to sit with increased assist and patient able to intiate LE movement to EOB, Assist for trunk elevation and hip rotation to bring to EOB Transfers Transfers: Sit to Stand;Stand to Sit;Stand Pivot Transfers Sit to Stand: 1: +2 Total assist;From bed;With upper extremity assist Sit to Stand: Patient Percentage: 20% Stand to Sit: 1: +2 Total assist;To chair/3-in-1;To bed;With upper extremity assist Stand to Sit: Patient Percentage: 20% Stand Pivot Transfers: 1: +2 Total assist Stand Pivot Transfers: Patient Percentage: 20% Details for Transfer Assistance: Pt required +3 total A for sit to stand and transfer.  Pt unable to lift buttocks from surface, and unable to initiate moving feet for transfer Ambulation/Gait Ambulation/Gait Assistance: Not tested (comment)    Exercises General Exercises - Lower Extremity Ankle Circles/Pumps: Supine;Seated;AROM;AAROM;Both;20 reps Long Arc Quad: AROM;10 reps;Seated;Both (pt unable to obtain full extension with decreased control) Other Exercises Other Exercises: AROM trunk flexion and extension in sitting EOB, 10x (extension more difficult but able to perform without assist)     PT Goals (current goals can now be found in the care plan section) Acute Rehab PT Goals Patient Stated Goal: none states PT Goal Formulation: With patient/family Time For Goal Achievement: 07/25/13 Potential to Achieve Goals: Fair  Visit Information  Last PT Received On:  07/17/13 Assistance Needed: +3 or more History of Present Illness: 72 y.o. female has had a complicated medical course over the last 1 month. Patient is unable to provide history due to weakness and? Dementia. History is provided by patient's daughter [name] who is at the bedside. 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 She was recently admitted twice at [facility]-06/04/13-06/06/13 (DKA) & 06/07/13-06/09/13 (metabolic encephalopathy, uncontrolled DM and CVA). Following the last discharge, patient was sent to rehabilitation where she was for approximately 3 weeks. Patient has had a sustained rapid decline at SNF. 2 days after going to SNF, she developed profuse diarrhea of 9 days duration, extremely poor appetite, lethargy, laying in bed and unwilling to participate in therapies, decreased urine output and progressive generalized weakness.     Subjective Data  Patient Stated Goal: none states   Cognition  Cognition Arousal/Alertness: Awake/alert Behavior During Therapy: Flat affect Overall Cognitive Status: Impaired/Different from baseline Area of Impairment: Orientation;Following commands;Problem solving;Attention Orientation Level: Disoriented to;Time;Situation Current Attention Level: Sustained (~30 seconds) Following Commands: Follows one step commands consistently Problem Solving: Slow processing    Balance  Balance Balance Assessed: Yes Static Sitting Balance Static Sitting - Balance Support: Right upper extremity supported;Feet supported Static Sitting - Level of Assistance: 5: Stand by assistance Static Sitting - Comment/# of Minutes: Sitting EOB  End of Session PT - End of Session Equipment Utilized During Treatment: Oxygen Activity Tolerance: Patient limited by fatigue Patient left: in chair;with call bell/phone within reach (with occupational therapist in room) Nurse  Communication: Mobility status   GP     Fabio Asa 07/17/2013, 1:35 PM Charlotte Crumb, PT DPT  [phone]

## 2013-07-17 NOTE — Progress Notes (Signed)
Covering Clinical Child psychotherapist (CSW) received a referral for SNF placement however pt will be having a palliative care meeting. CSW will assess once GOC have been confirmed.  Theresia Bough, MSW, LCSW [phone]

## 2013-07-17 NOTE — Progress Notes (Signed)
Subjective: No significant improvement, seems to wax and wane.   Exam: Filed Vitals:   07/17/13 1700  BP: 121/58  Pulse: 71  Temp: 98.4 F (36.9 C)  Resp: 18   Gen: In bed, NAD MS: Awakens easily, is able to answer simple questions such as who is in the room with her. She still has some signs of left-sided neglect CN: Pupils equal round and reactive, extraocular movements intact, visual fields full (able to count fingers in all fields) Motor: Moving both arms equally, though an*to do something with her left arm she sometimes will use her right. Sensory: Reports sensation in all 4 extremities  Impression: 72 year old female with acute encephalopathy that started in the setting of high fever and neck rigidity. Though she was treated with antibiotics for 2 days prior LP, if she had had a bacterial meningitis, I would have expected continued inflammation, though this does remain a possibility. The stroke seen on the recent MRI is tiny, and I would not expect it to be causing her current mental status changes.   Her left-sided neglect could be due to her right thalamic infarct, and this is really her major focal finding. There is some intermittent activity in the EEG which could represent seizures and therefore this point I would favor treating him empirically and seeing if she improves.  Recommendations: 1) increase Keppra to 1000 mg twice a day 2) if no improvement by tomorrow, would add second agent  Ritta Slot, MD [facility] [phone]  If 7pm- 7am, please page neurology on call at [phone].

## 2013-07-17 NOTE — Progress Notes (Signed)
PT got patient up to chair.  Patient tolerating well.  Patient ate 50% of her lunch and had 4 sips of her milk. Patient able to hold spoon in her left hand but is having difficulty feeding herself.   She tolerated eating well and there were no signs of aspiration.  Lance Bosch, RN

## 2013-07-17 NOTE — Progress Notes (Signed)
[facility] PROGRESS NOTE  Regina [name] WJX:[id number] DOB: Sep 17, 1941 DOA: 07/01/2013 PCP: Michiel Sites, MD Brief HPI: 72 yo female admitted with generalized weakness, and found to have UTI, diarrhea and acute renal failure. Found to have C diff. 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  Assessment/Plan: #1 acute encephalopathy Questionable etiology. Clinical improvement. Patient alert,but not following commands. May be secondary to seizures in the setting of CVA.  - Neurology currently on board and making further recommendations.  - Of note patient has a low vitamin B 12 level.  Which was replaced IV x 1 on 07/16/13 - Etiology uncertain. Neurology currently working up patient and considering treatment for bursts of delta waves on EEG.  #2 probable seizure Per neurology.  #3 possible meningitis/CSF infection  Patient noted to have a rectal temperature 102.9  5 days ago. Fever curve trending down. Patient had significant nuchal rigidity. Patient with altered mental status, but improving daily. Blood cultures are pending. Patient s/p LP.  I currently getting treated for C diff. - ID on board and currently has discontinued antiviral medication and antibiotics based on low index of suspicion of infectious etiology as cause of presenting symptoms. Neurology on board and currently investigating for vasculitis. - WBC within normal limits  #4 acute renal failure Likely secondary to volume depletion and hypotension in the setting of ARB. Clinical improvement.  Arb and metformin held - Resolved.  #5 hypokalemia/hypomagnesemia Likely secondary to GI losses from C. difficile colitis and increased urinary output. Replete.Keep magnesium at 2. On 07/16/13 magnesium level at 1.5 and repleted. - reassess next am.  #6 C. difficile colitis Continue oral vancomycin to per panda. Follow.  #7 hypernatremia Resolved.  #8 Escherichia coli UTI Patient had received IV Rocephin x3 days.    #9 hypertension D/C IV Lopressor. Resume home dose coreg and norvasc 5 mg daily. - continue to monitor and adjust antihypertensive medication as needed. If blood pressures remain elevated will plan on increasing norvasc.   #10 history of CVA Aggrenox held for lumbar puncture 2 days ago. Patient alert, however platelets 62. Will defer to neurology, when to resume.  #11 diabetes mellitus Hemoglobin A1c is 9.6. CBGs have ranged from 119- 185. Decadron d/c'd. Continue Levemir 45 units subcutaneous twice a day. Continue sliding scale insulin.  CBG (last 3)   Recent Labs  07/17/13 0017 07/17/13 0403 07/17/13 0746  GLUCAP 131* 119* 134*     #12 hypothyroidism Continue Synthroid and Cytomel.  #13 anxiety/depression Stable.  #14 Nuitrition Placed panda tube 9/29. On TF. SLP for eval.Patient with diarrhea which started after TF.   #15. Thrombocytopenia Likely secondary to infection. No bleeding noted. Hold lovenox. Follow.  #16 prophylaxis  Pepcid for GI prophylaxis. SCDs for DVT prophylaxis.   Code Status: DNR Family Communication: none at bedside.  Disposition Plan: pending palliative care consult and further recommendations from Neurology   Consultants:  Nephrology: Dr. Eliott Nine 07/02/2013:  Neurology : Dr Thad Ranger 07/06/13  ID: Dr Luciana Axe 07/09/13  Procedures: MRI brain 8/27 >> small acute lacunar infarct in medial Rt thalamus, ? Punctate acute lacunar infarct central pons  CT head 07/05/2013  KUB 07/05/2013  KUB 07/01/2013  Chest x-ray 07/01/2013  MRI head 07/06/13  EEG 07/06/13  LP 07/07/13  PICC line 07/09/13  Antibiotics:  IV Rocephin 07/01/2013--> 07/05/2013  IV Flagyl 07/04/2013-->07/10/13  Oral flagyl 07/10/13-9/30  IV vancomycin 07/06/2013-9/30  IV Rocephin 07/06/2013- 9/30  IV ampicillin 07/06/2013--->07/09/13  IV Acyclovir 07/06/13- 9/30  Oral vancomycin 9/30  HPI/Subjective: No acute issues reported overnight. Patient answering my  questions today.  Objective: Filed Vitals:   07/17/13 0537  BP: 149/59  Pulse: 93  Temp: 99.2 F (37.3 C)  Resp: 18    Intake/Output Summary (Last 24 hours) at 07/17/13 1116 Last data filed at 07/17/13 1050  Gross per 24 hour  Intake    270 ml  Output   1700 ml  Net  -1430 ml   Filed Weights   07/14/13 0419 07/15/13 0746 07/16/13 0500  Weight: 103.4 kg (227 lb 15.3 oz) 105.9 kg (233 lb 7.5 oz) 105 kg (231 lb 7.7 oz)    Exam:   General:  Eyes open. Comfortable.   Cardiovascular: RRR, + systolic murmur  Respiratory: CTAB anterior lung fields, no wheezes on ausculatation  Abdomen: Soft/NT/ND/+BS  Musculoskeletal: No clubbing Neuro: Patient is alert and awake  Data Reviewed: Basic Metabolic Panel:  Recent Labs Lab 07/11/13 0630 07/12/13 0505 07/13/13 0444 07/14/13 0510 07/15/13 0525 07/16/13 0500 07/17/13 0500  NA 139 140 138 140 139 141 137  K 4.1 4.2 4.4 4.1 4.2 4.3 4.2  CL 105 106 106 106 103 106 100  CO2 27 26 27 29 28 29  32  GLUCOSE 106* 250* 181* 137* 155* 139* 128*  BUN 14 11 11 10 9 10 10   CREATININE 0.68 0.64 0.61 0.54 0.50 0.51 0.51  CALCIUM 7.8* 8.1* 8.3* 8.5 8.5 8.2* 8.6  MG 1.6 1.7  --   --   --  1.5  --   PHOS 3.6 3.6 4.0 4.2 4.1 4.3 4.6   Liver Function Tests:  Recent Labs Lab 07/13/13 0444 07/14/13 0510 07/15/13 0525 07/16/13 0500 07/17/13 0500  ALBUMIN 2.1* 2.2* 2.3* 2.0* 2.2*   No results found for this basename: LIPASE, AMYLASE,  in the last 168 hours No results found for this basename: AMMONIA,  in the last 168 hours CBC:  Recent Labs Lab 07/11/13 0630 07/12/13 0505 07/14/13 0510 07/16/13 0500  WBC 5.7 6.2 7.2 6.0  NEUTROABS 3.5  --   --   --   HGB 11.2* 10.6* 9.8* 9.4*  HCT 33.6* 32.2* 29.7* 27.4*  MCV 92.3 93.6 94.6 93.2  PLT 62* 59* 73* 116*   Cardiac Enzymes: No results found for this basename: CKTOTAL, CKMB, CKMBINDEX, TROPONINI,  in the last 168 hours BNP (last 3 results) No results found for this  basename: PROBNP,  in the last 8760 hours CBG:  Recent Labs Lab 07/16/13 1615 07/16/13 2023 07/17/13 0017 07/17/13 0403 07/17/13 0746  GLUCAP 185* 137* 131* 119* 134*    Recent Results (from the past 240 hour(s))  GRAM STAIN     Status: None   Collection Time    07/07/13  5:40 PM      Result Value Range Status   Specimen Description CSF CSF   Final   Special Requests NONE   Final   Gram Stain     Final   Value: NO ORGANISMS SEEN     NO WBC SEEN     Gram Stain Report Called to,Read Back By and Verified With: AYERS,C. AT 1958 ON 09.26.14 BY LOVE,T.   Report Status 07/07/2013 FINAL   Final  BODY FLUID CULTURE     Status: None   Collection Time    07/07/13  5:42 PM      Result Value Range Status   Specimen Description Body Fluid   Final   Special Requests NONE   Final   Gram Stain  Final   Value: NO WBC SEEN     NO ORGANISMS SEEN     Performed at [facility]   Culture     Final   Value: NO GROWTH 3 DAYS     Performed at [facility]   Report Status 07/11/2013 FINAL   Final     Studies: Ct Angio Head W/cm &/or Wo Cm  07/15/2013   CLINICAL DATA:  Acute encephalopathy in the setting of high fever. Evaluate for cause of punctate acute infarction left parietal gyrus discovered 07/13/2013. Stroke risk factors include diabetes mellitus, and hypertension.  EXAM: CT ANGIOGRAPHY HEAD AND NECK  TECHNIQUE: Multidetector CT imaging of the head and neck was performed using the standard protocol during bolus administration of intravenous contrast. Multiplanar CT image reconstructions including MIPs were obtained to evaluate the vascular anatomy. Carotid stenosis measurements (when applicable) are obtained utilizing NASCET criteria, using the distal internal carotid diameter as the denominator.  CONTRAST:  50mL OMNIPAQUE IOHEXOL 350 MG/ML SOLN  COMPARISON:  MR brain 07/13/2013.  MRA head 06/09/2013.  FINDINGS: CTA HEAD FINDINGS  No visible acute large vessel infarct.  Punctate infarct observed on MRI are not visible. No acute hemorrhage, mass lesion, hydrocephalus, or extra-axial fluid. Global atrophy with chronic microvascular ischemic change. No abnormal postcontrast enhancement. Internal carotid arteries widely patent. Basilar artery widely patent with right vertebral dominant. Critical stenosis distal left vertebral, greater than 90% does not involve the PICA, and is of doubtful significance given the dominant right vertebral  Small A1 ACA on the left. Robust right A1 ACA is dominant. Bilateral 50-75% pericallosal ACA stenoses.  50% stenosis M1 segment right middle cerebral artery. No left M1 MCA stenosis. Mild irregularity distal MCA and PCA branches consistent with intracranial atherosclerotic change but no distal branch occlusion.  90% stenosis origin P1 left PCA (image 18 series [id number]), also moderate irregularity left greater than right P2 PCA segments.  No cerebellar branch occlusion.  Review of the MIP images confirms the above findings.  CTA NECK FINDINGS  Transverse arch atheromatous change without ulceration. Dolichoectasia great vessels without proximal stenosis. No measurable carotid stenosis with mild non stenotic predominantly calcific plaque bilaterally. No evidence for ICA dissection or fibromuscular dysplasia. Both vertebral arteries are patent from their origins through the neck with right dominant. No ostial stenosis.  Mild cervical spondylosis. No neck masses. Mild vascular congestion in the chest. No adenopathy. Nasogastric tube. No sinus or mastoid disease.  Review of the MIP images confirms the above findings.  IMPRESSION: CTA HEAD IMPRESSION  90% stenosis P1 segment left PCA could correlate with the observed pattern of infarction.  Critical stenosis distal left vertebral of doubtful significance, as the right vertebral is dominant.  No flow-limiting stenosis of the internal carotid arteries, basilar artery, or proximal ACA or MCA branches.  CTA NECK  IMPRESSION  No extracranial flow reducing lesion is identified. Mild non stenotic atheromatous change at the bifurcations. No evidence for vertebral artery compromise. No extracranial dissection.   Electronically Signed   By: Davonna Belling M.D.   On: 07/15/2013 16:37   Ct Angio Neck W/cm &/or Wo/cm  07/15/2013   CLINICAL DATA:  Acute encephalopathy in the setting of high fever. Evaluate for cause of punctate acute infarction left parietal gyrus discovered 07/13/2013. Stroke risk factors include diabetes mellitus, and hypertension.  EXAM: CT ANGIOGRAPHY HEAD AND NECK  TECHNIQUE: Multidetector CT imaging of the head and neck was performed using the standard protocol during bolus administration of intravenous  contrast. Multiplanar CT image reconstructions including MIPs were obtained to evaluate the vascular anatomy. Carotid stenosis measurements (when applicable) are obtained utilizing NASCET criteria, using the distal internal carotid diameter as the denominator.  CONTRAST:  50mL OMNIPAQUE IOHEXOL 350 MG/ML SOLN  COMPARISON:  MR brain 07/13/2013.  MRA head 06/09/2013.  FINDINGS: CTA HEAD FINDINGS  No visible acute large vessel infarct. Punctate infarct observed on MRI are not visible. No acute hemorrhage, mass lesion, hydrocephalus, or extra-axial fluid. Global atrophy with chronic microvascular ischemic change. No abnormal postcontrast enhancement. Internal carotid arteries widely patent. Basilar artery widely patent with right vertebral dominant. Critical stenosis distal left vertebral, greater than 90% does not involve the PICA, and is of doubtful significance given the dominant right vertebral  Small A1 ACA on the left. Robust right A1 ACA is dominant. Bilateral 50-75% pericallosal ACA stenoses.  50% stenosis M1 segment right middle cerebral artery. No left M1 MCA stenosis. Mild irregularity distal MCA and PCA branches consistent with intracranial atherosclerotic change but no distal branch occlusion.  90%  stenosis origin P1 left PCA (image 18 series [id number]), also moderate irregularity left greater than right P2 PCA segments.  No cerebellar branch occlusion.  Review of the MIP images confirms the above findings.  CTA NECK FINDINGS  Transverse arch atheromatous change without ulceration. Dolichoectasia great vessels without proximal stenosis. No measurable carotid stenosis with mild non stenotic predominantly calcific plaque bilaterally. No evidence for ICA dissection or fibromuscular dysplasia. Both vertebral arteries are patent from their origins through the neck with right dominant. No ostial stenosis.  Mild cervical spondylosis. No neck masses. Mild vascular congestion in the chest. No adenopathy. Nasogastric tube. No sinus or mastoid disease.  Review of the MIP images confirms the above findings.  IMPRESSION: CTA HEAD IMPRESSION  90% stenosis P1 segment left PCA could correlate with the observed pattern of infarction.  Critical stenosis distal left vertebral of doubtful significance, as the right vertebral is dominant.  No flow-limiting stenosis of the internal carotid arteries, basilar artery, or proximal ACA or MCA branches.  CTA NECK IMPRESSION  No extracranial flow reducing lesion is identified. Mild non stenotic atheromatous change at the bifurcations. No evidence for vertebral artery compromise. No extracranial dissection.   Electronically Signed   By: Davonna Belling M.D.   On: 07/15/2013 16:37    Scheduled Meds: . alteplase  2 mg Intracatheter Once  . amLODipine  10 mg Per NG tube Daily  . antiseptic oral rinse  15 mL Mouth Rinse q12n4p  . carvedilol  12.5 mg Per NG tube BID WC  . chlorhexidine  15 mL Mouth Rinse BID  . insulin aspart  0-20 Units Subcutaneous Q4H  . insulin detemir  50 Units Subcutaneous BID  . latanoprost  1 drop Both Eyes QHS  . levETIRAcetam  500 mg Intravenous Q12H  . levothyroxine  75 mcg Per NG tube QAC breakfast  . liothyronine  25 mcg Per NG tube BID  . saccharomyces  boulardii  250 mg Oral BID  . sodium chloride  10-40 mL Intracatheter Q12H  . sodium chloride  3 mL Intravenous Q12H  . vancomycin  125 mg Per Tube Q6H   Continuous Infusions: . feeding supplement (JEVITY 1.2 CAL) 1,000 mL (07/16/13 0620)  . sodium chloride 0.9 % 1,000 mL with potassium chloride 20 mEq infusion 100 mL/hr at 07/16/13 6045    Principal Problem:   Acute encephalopathy Active Problems:   HYPOTHYROIDISM   DIABETES MELLITUS, TYPE II   HYPERLIPIDEMIA  UTI (urinary tract infection)   HTN (hypertension)   Anxiety and depression   CVA (cerebral infarction)   Weakness generalized   Dehydration   Acute renal failure   Failure to thrive   Diarrhea   Hyponatremia   Metabolic acidosis   Clostridium difficile colitis   Hypomagnesemia   Hypernatremia   Seizures   Meningitis, unspecified(322.9)   Protein-calorie malnutrition, severe   Thrombocytopenia, unspecified    Time spent: > 35 mins    Penny Pia  [facility] Pager [phone] If 7PM-7AM, please contact night-coverage at [email or web address], password [facility] 07/17/2013, 11:16 AM  LOS: 16 days

## 2013-07-17 NOTE — Progress Notes (Addendum)
NUTRITION FOLLOW UP  Intervention:    Nocturnal cycle EN regimen: Jevity 1.2 formula at 60 ml/hr x 12 hours (7 PM--7 AM) to optimize oral intake  Ensure Complete twice daily (350 kcals, 13 gm protein per 8 fl oz bottle) RD to follow for nutrition care plan  Nutrition Dx:   Inadequate oral intake related to decreased appetite, limited ability to self-feed, ongoing  New Goal:   Successful transition to oral diet to meet >/= 90% of estimated nutrition needs, progressing  Monitor:   EN regimen, PO intake, weight, labs, I/O's  Assessment:   PMHx significant for DM2, retinopathy, legal blindness, HTN, CVA, hypothyroidism, anxiety/depression. Noted pt with 2 recent admissions to [facility] within the past month. Pt was d/c'd to rehab for about 3 weeks after hospitalization. Pt developed diarrhea, poor appetite, and overall decline x 9 days at [facility]. Family thought decline was 2/2 lack of care at SNF, pt was then sent home. Remains weak and bed bound. Reported weight loss of 25 lb x 1 month.  Jevity 1.2 formula currently infusing at 60 ml/hr via small bore feeding tube providing 1728 kcal, 80 grams, 1162 ml free water.  S/p dysphagia treatment this AM with significant progress; initiated on Dys 1--thin liquid diet.  PO intake 50% at lunch today.  Palliative Care Team note reviewed 10/4.  RD with EN management privileges, initially consulted 9/28.  Height: Ht Readings from Last 1 Encounters:  07/11/13 5\' 2"  (1.575 m)    Weight Status:   Wt Readings from Last 1 Encounters:  07/16/13 231 lb 7.7 oz (105 kg)    Re-estimated needs:  Kcal: 1550-1750 Protein: 80-95 gm Fluid: 1.5-1.7 L  Skin: Intact  Diet Order: Dysphagia 1, thin liquids   Intake/Output Summary (Last 24 hours) at 07/17/13 1535 Last data filed at 07/17/13 1050  Gross per 24 hour  Intake    270 ml  Output   1700 ml  Net  -1430 ml    Labs:   Recent Labs Lab 07/11/13 0630 07/12/13 0505  07/15/13 0525 07/16/13 0500  07/17/13 0500  NA 139 140  < > 139 141 137  K 4.1 4.2  < > 4.2 4.3 4.2  CL 105 106  < > 103 106 100  CO2 27 26  < > 28 29 32  BUN 14 11  < > 9 10 10   CREATININE 0.68 0.64  < > 0.50 0.51 0.51  CALCIUM 7.8* 8.1*  < > 8.5 8.2* 8.6  MG 1.6 1.7  --   --  1.5  --   PHOS 3.6 3.6  < > 4.1 4.3 4.6  GLUCOSE 106* 250*  < > 155* 139* 128*  < > = values in this interval not displayed.  CBG (last 3)   Recent Labs  07/17/13 0403 07/17/13 0746 07/17/13 1156  GLUCAP 119* 134* 126*    Scheduled Meds: . alteplase  2 mg Intracatheter Once  . amLODipine  10 mg Per NG tube Daily  . antiseptic oral rinse  15 mL Mouth Rinse q12n4p  . carvedilol  12.5 mg Per NG tube BID WC  . chlorhexidine  15 mL Mouth Rinse BID  . insulin aspart  0-20 Units Subcutaneous Q4H  . insulin detemir  50 Units Subcutaneous BID  . latanoprost  1 drop Both Eyes QHS  . levETIRAcetam  500 mg Intravenous Q12H  . levothyroxine  75 mcg Per NG tube QAC breakfast  . liothyronine  25 mcg Per NG tube BID  .  saccharomyces boulardii  250 mg Oral BID  . sodium chloride  10-40 mL Intracatheter Q12H  . sodium chloride  3 mL Intravenous Q12H  . vancomycin  125 mg Per Tube Q6H    Continuous Infusions: . feeding supplement (JEVITY 1.2 CAL) 1,000 mL (07/16/13 0620)  . sodium chloride 0.9 % 1,000 mL with potassium chloride 20 mEq infusion 100 mL/hr at 07/16/13 0616    Maureen Chatters, RD, LDN Pager #: [phone] After-Hours Pager #: [phone]

## 2013-07-17 NOTE — Progress Notes (Addendum)
[facility] PROGRESS NOTE  Regina [name] BTD:[id number] DOB: September 21, 1941 DOA: 07/01/2013 PCP: Michiel Sites, MD Brief HPI: 72 yo female admitted with generalized weakness, and found to have UTI, diarrhea and acute renal failure and C diff. 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  Assessment/Plan: #1 acute encephalopathy Questionable etiology.  -May be secondary to seizures in the setting of recent CVA.  -Patient  following simple commands.  - Neurology currently on board and making further recommendations, neuro considering treating empirically for seizures given delta wave spikes on EEG.  - Vitamin B 12  Replaced - PT to work with pt to evaluate and treat   #2 probable seizure -EEG completed  -official report:EEG is consistent with a Mild non-specific generalized cerebral dysfunction(encephalopathy). suspicion  that the recurrent bursts of activity described above are associated with drowsiness and/or encephalopathy, but there is a  small chance that they could represent recurrent ictal discharges.  - Keppra 1 gm BID and Depakote on board.  #3 possible meningitis/CSF infection  Patient noted to have a rectal temperature 102.9  5 days ago. Fever curve trending down with Tmax 99.2 on 07/16/13. Patient had significant nuchal rigidity. Patient with altered mental status, but improving daily.Patient s/p LP .   - ID on board and  discontinued antiviral medication and antibiotics based on low index of suspicion of infectious etiology as cause of presenting symptoms. - Neurology on board  - WBC within normal limits on last check  #4 acute renal failure Likely secondary to volume depletion and hypotension in the setting of ARB. Clinical improvement.  Arb and metformin held - Resolved.  #5 hypokalemia/hypomagnesemia -Likely secondary to GI losses from C. difficile colitis and increased urinary output.  -Keep magnesium at 2.  magnesium level 2.0 on 07/16/13  - Will check BMP  and Mag tomorrow AM   #6 C. difficile colitis Continue oral vancomycin per panda. Follow. -Per nursing staff decreased liquid consistency output with rectal foley.  - May consider removing rectal foley in next couple of days   #7 hypernatremia Sodium levels improved and now at 137.  - will decrease IVF to 75cc/hr . follow.   #8 Escherichia coli UTI Patient  received IV Rocephin x3 days.   #9 hypertension D/C IV Lopressor. Resume home dose coreg and norvasc 5 mg daily. -blood pressures relatively well controlled. - continue to monitor and adjust antihypertensive medication as needed.  #10 history of CVA Aggrenox held for lumbar puncture 2 days ago. Patient alert,  platelets 116 on 07/16/13. Will defer to neurology, when to resume.  #11 diabetes mellitus Hemoglobin A1c is 9.6.  - Patient had bout of hypoglycemia as such will reduce current long acting insulin regimen by 10 percent (currently levemir at 50 units BID) - Lower levemir to 40 units BID - If blood sugars steady next AM would consider saline locking patient.  CBG (last 3)   Recent Labs  07/17/13 0017 07/17/13 0403 07/17/13 0746  GLUCAP 131* 119* 134*     #12 hypothyroidism Continue Synthroid and Cytomel.  #13 anxiety/depression Stable.  #14 Nuitrition Placed panda tube 9/29. On TF.  -SLP recommends dysphagia puree diet with thin liquids and strict precaution - Panda to stay in place until intake is sufficient for nutritional support - Patient with diarrhea which started after TF.   #15. Thrombocytopenia Likely secondary to infection. No bleeding noted. Hold lovenox. Follow.  #16 prophylaxis  Pepcid for GI prophylaxis. SCDs for DVT prophylaxis.   Code Status:  DNR Family Communication: none at bedside.  Disposition Plan: pending  further recommendations from Neurology   Consultants:  Nephrology: Dr. Eliott Nine 07/02/2013:  Neurology : Dr Thad Ranger 07/06/13  ID: Dr Luciana Axe 07/09/13  Procedures: MRI  brain 8/27 >> small acute lacunar infarct in medial Rt thalamus, ? Punctate acute lacunar infarct central pons CT head 07/05/2013  KUB 07/05/2013  KUB 07/01/2013  Chest x-ray 07/01/2013  MRI head 07/06/13  EEG 07/06/13  LP 07/07/13  PICC line 07/09/13  Antibiotics:  IV Rocephin 07/01/2013--> 07/05/2013  IV Flagyl 07/04/2013-->07/10/13  Oral flagyl 07/10/13-9/30  IV vancomycin 07/06/2013-9/30  IV Rocephin 07/06/2013- 9/30  IV ampicillin 07/06/2013--->07/09/13  IV Acyclovir 07/06/13- 9/30  Oral vancomycin 9/30  HPI/Subjective: No acute issues reported overnight. Pt follows simple commands.   Objective: Filed Vitals:   07/17/13 0537  BP: 149/59  Pulse: 93  Temp: 99.2 F (37.3 C)  Resp: 18    Intake/Output Summary (Last 24 hours) at 07/17/13 1122 Last data filed at 07/17/13 1050  Gross per 24 hour  Intake    270 ml  Output   1700 ml  Net  -1430 ml   Filed Weights   07/14/13 0419 07/15/13 0746 07/16/13 0500  Weight: 103.4 kg (227 lb 15.3 oz) 105.9 kg (233 lb 7.5 oz) 105 kg (231 lb 7.7 oz)    Exam:   General: Pt laying in bed in NAD .   Cardiovascular: RRR with no MRG. Radial and pedal pulses 2+ with no cyanosis or edema   Respiratory: regular, unlabored breathing. CTAB anterior lung fields, no wheezes on ausculatation  Abdomen: Soft/NT/ND/+BS  Musculoskeletal: No clubbing. Neuro: Patient gazes in my direction during exam with limited answers to my questions. Moves upper extremities equally  Data Reviewed: Basic Metabolic Panel:  Recent Labs Lab 07/11/13 0630 07/12/13 0505 07/13/13 0444 07/14/13 0510 07/15/13 0525 07/16/13 0500 07/17/13 0500  NA 139 140 138 140 139 141 137  K 4.1 4.2 4.4 4.1 4.2 4.3 4.2  CL 105 106 106 106 103 106 100  CO2 27 26 27 29 28 29  32  GLUCOSE 106* 250* 181* 137* 155* 139* 128*  BUN 14 11 11 10 9 10 10   CREATININE 0.68 0.64 0.61 0.54 0.50 0.51 0.51  CALCIUM 7.8* 8.1* 8.3* 8.5 8.5 8.2* 8.6  MG 1.6 1.7  --   --    --  1.5  --   PHOS 3.6 3.6 4.0 4.2 4.1 4.3 4.6   Liver Function Tests:  Recent Labs Lab 07/13/13 0444 07/14/13 0510 07/15/13 0525 07/16/13 0500 07/17/13 0500  ALBUMIN 2.1* 2.2* 2.3* 2.0* 2.2*   No results found for this basename: LIPASE, AMYLASE,  in the last 168 hours No results found for this basename: AMMONIA,  in the last 168 hours CBC:  Recent Labs Lab 07/11/13 0630 07/12/13 0505 07/14/13 0510 07/16/13 0500  WBC 5.7 6.2 7.2 6.0  NEUTROABS 3.5  --   --   --   HGB 11.2* 10.6* 9.8* 9.4*  HCT 33.6* 32.2* 29.7* 27.4*  MCV 92.3 93.6 94.6 93.2  PLT 62* 59* 73* 116*   Cardiac Enzymes: No results found for this basename: CKTOTAL, CKMB, CKMBINDEX, TROPONINI,  in the last 168 hours BNP (last 3 results) No results found for this basename: PROBNP,  in the last 8760 hours CBG:  Recent Labs Lab 07/16/13 1615 07/16/13 2023 07/17/13 0017 07/17/13 0403 07/17/13 0746  GLUCAP 185* 137* 131* 119* 134*    Recent Results (from the past 240 hour(s))  GRAM STAIN     Status: None   Collection Time    07/07/13  5:40 PM      Result Value Range Status   Specimen Description CSF CSF   Final   Special Requests NONE   Final   Gram Stain     Final   Value: NO ORGANISMS SEEN     NO WBC SEEN     Gram Stain Report Called to,Read Back By and Verified With: AYERS,C. AT 1958 ON 09.26.14 BY LOVE,T.   Report Status 07/07/2013 FINAL   Final  BODY FLUID CULTURE     Status: None   Collection Time    07/07/13  5:42 PM      Result Value Range Status   Specimen Description Body Fluid   Final   Special Requests NONE   Final   Gram Stain     Final   Value: NO WBC SEEN     NO ORGANISMS SEEN     Performed at [facility]   Culture     Final   Value: NO GROWTH 3 DAYS     Performed at [facility]   Report Status 07/11/2013 FINAL   Final     Studies: Ct Angio Head W/cm &/or Wo Cm  07/15/2013   CLINICAL DATA:  Acute encephalopathy in the setting of high fever. Evaluate for  cause of punctate acute infarction left parietal gyrus discovered 07/13/2013. Stroke risk factors include diabetes mellitus, and hypertension.  EXAM: CT ANGIOGRAPHY HEAD AND NECK  TECHNIQUE: Multidetector CT imaging of the head and neck was performed using the standard protocol during bolus administration of intravenous contrast. Multiplanar CT image reconstructions including MIPs were obtained to evaluate the vascular anatomy. Carotid stenosis measurements (when applicable) are obtained utilizing NASCET criteria, using the distal internal carotid diameter as the denominator.  CONTRAST:  50mL OMNIPAQUE IOHEXOL 350 MG/ML SOLN  COMPARISON:  MR brain 07/13/2013.  MRA head 06/09/2013.  FINDINGS: CTA HEAD FINDINGS  No visible acute large vessel infarct. Punctate infarct observed on MRI are not visible. No acute hemorrhage, mass lesion, hydrocephalus, or extra-axial fluid. Global atrophy with chronic microvascular ischemic change. No abnormal postcontrast enhancement. Internal carotid arteries widely patent. Basilar artery widely patent with right vertebral dominant. Critical stenosis distal left vertebral, greater than 90% does not involve the PICA, and is of doubtful significance given the dominant right vertebral  Small A1 ACA on the left. Robust right A1 ACA is dominant. Bilateral 50-75% pericallosal ACA stenoses.  50% stenosis M1 segment right middle cerebral artery. No left M1 MCA stenosis. Mild irregularity distal MCA and PCA branches consistent with intracranial atherosclerotic change but no distal branch occlusion.  90% stenosis origin P1 left PCA (image 18 series [id number]), also moderate irregularity left greater than right P2 PCA segments.  No cerebellar branch occlusion.  Review of the MIP images confirms the above findings.  CTA NECK FINDINGS  Transverse arch atheromatous change without ulceration. Dolichoectasia great vessels without proximal stenosis. No measurable carotid stenosis with mild non stenotic  predominantly calcific plaque bilaterally. No evidence for ICA dissection or fibromuscular dysplasia. Both vertebral arteries are patent from their origins through the neck with right dominant. No ostial stenosis.  Mild cervical spondylosis. No neck masses. Mild vascular congestion in the chest. No adenopathy. Nasogastric tube. No sinus or mastoid disease.  Review of the MIP images confirms the above findings.  IMPRESSION: CTA HEAD IMPRESSION  90% stenosis P1 segment left PCA could correlate with the  observed pattern of infarction.  Critical stenosis distal left vertebral of doubtful significance, as the right vertebral is dominant.  No flow-limiting stenosis of the internal carotid arteries, basilar artery, or proximal ACA or MCA branches.  CTA NECK IMPRESSION  No extracranial flow reducing lesion is identified. Mild non stenotic atheromatous change at the bifurcations. No evidence for vertebral artery compromise. No extracranial dissection.   Electronically Signed   By: Davonna Belling M.D.   On: 07/15/2013 16:37   Ct Angio Neck W/cm &/or Wo/cm  07/15/2013   CLINICAL DATA:  Acute encephalopathy in the setting of high fever. Evaluate for cause of punctate acute infarction left parietal gyrus discovered 07/13/2013. Stroke risk factors include diabetes mellitus, and hypertension.  EXAM: CT ANGIOGRAPHY HEAD AND NECK  TECHNIQUE: Multidetector CT imaging of the head and neck was performed using the standard protocol during bolus administration of intravenous contrast. Multiplanar CT image reconstructions including MIPs were obtained to evaluate the vascular anatomy. Carotid stenosis measurements (when applicable) are obtained utilizing NASCET criteria, using the distal internal carotid diameter as the denominator.  CONTRAST:  50mL OMNIPAQUE IOHEXOL 350 MG/ML SOLN  COMPARISON:  MR brain 07/13/2013.  MRA head 06/09/2013.  FINDINGS: CTA HEAD FINDINGS  No visible acute large vessel infarct. Punctate infarct observed on MRI  are not visible. No acute hemorrhage, mass lesion, hydrocephalus, or extra-axial fluid. Global atrophy with chronic microvascular ischemic change. No abnormal postcontrast enhancement. Internal carotid arteries widely patent. Basilar artery widely patent with right vertebral dominant. Critical stenosis distal left vertebral, greater than 90% does not involve the PICA, and is of doubtful significance given the dominant right vertebral  Small A1 ACA on the left. Robust right A1 ACA is dominant. Bilateral 50-75% pericallosal ACA stenoses.  50% stenosis M1 segment right middle cerebral artery. No left M1 MCA stenosis. Mild irregularity distal MCA and PCA branches consistent with intracranial atherosclerotic change but no distal branch occlusion.  90% stenosis origin P1 left PCA (image 18 series [id number]), also moderate irregularity left greater than right P2 PCA segments.  No cerebellar branch occlusion.  Review of the MIP images confirms the above findings.  CTA NECK FINDINGS  Transverse arch atheromatous change without ulceration. Dolichoectasia great vessels without proximal stenosis. No measurable carotid stenosis with mild non stenotic predominantly calcific plaque bilaterally. No evidence for ICA dissection or fibromuscular dysplasia. Both vertebral arteries are patent from their origins through the neck with right dominant. No ostial stenosis.  Mild cervical spondylosis. No neck masses. Mild vascular congestion in the chest. No adenopathy. Nasogastric tube. No sinus or mastoid disease.  Review of the MIP images confirms the above findings.  IMPRESSION: CTA HEAD IMPRESSION  90% stenosis P1 segment left PCA could correlate with the observed pattern of infarction.  Critical stenosis distal left vertebral of doubtful significance, as the right vertebral is dominant.  No flow-limiting stenosis of the internal carotid arteries, basilar artery, or proximal ACA or MCA branches.  CTA NECK IMPRESSION  No extracranial flow  reducing lesion is identified. Mild non stenotic atheromatous change at the bifurcations. No evidence for vertebral artery compromise. No extracranial dissection.   Electronically Signed   By: Davonna Belling M.D.   On: 07/15/2013 16:37    Scheduled Meds: . alteplase  2 mg Intracatheter Once  . amLODipine  10 mg Per NG tube Daily  . antiseptic oral rinse  15 mL Mouth Rinse q12n4p  . carvedilol  12.5 mg Per NG tube BID WC  . chlorhexidine  15 mL  Mouth Rinse BID  . insulin aspart  0-20 Units Subcutaneous Q4H  . insulin detemir  50 Units Subcutaneous BID  . latanoprost  1 drop Both Eyes QHS  . levETIRAcetam  500 mg Intravenous Q12H  . levothyroxine  75 mcg Per NG tube QAC breakfast  . liothyronine  25 mcg Per NG tube BID  . saccharomyces boulardii  250 mg Oral BID  . sodium chloride  10-40 mL Intracatheter Q12H  . sodium chloride  3 mL Intravenous Q12H  . vancomycin  125 mg Per Tube Q6H   Continuous Infusions: . feeding supplement (JEVITY 1.2 CAL) 1,000 mL (07/16/13 0620)  . sodium chloride 0.9 % 1,000 mL with potassium chloride 20 mEq infusion 100 mL/hr at 07/16/13 1610    Principal Problem:   Acute encephalopathy Active Problems:   HYPOTHYROIDISM   DIABETES MELLITUS, TYPE II   HYPERLIPIDEMIA   UTI (urinary tract infection)   HTN (hypertension)   Anxiety and depression   CVA (cerebral infarction)   Weakness generalized   Dehydration   Acute renal failure   Failure to thrive   Diarrhea   Hyponatremia   Metabolic acidosis   Clostridium difficile colitis   Hypomagnesemia   Hypernatremia   Seizures   Meningitis, unspecified(322.9)   Protein-calorie malnutrition, severe   Thrombocytopenia, unspecified    Time spent: > 35 mins    Penny Pia  [facility] Pager [phone] If 7PM-7AM, please contact night-coverage at [email or web address], password [email or web address] 07/17/2013, 11:22 AM  LOS: 16 days

## 2013-07-18 LAB — GLUCOSE, CAPILLARY
Glucose-Capillary: 22 mg/dL — CL (ref 70–99)
Glucose-Capillary: 127 mg/dL — ABNORMAL HIGH (ref 70–99)
Glucose-Capillary: 149 mg/dL — ABNORMAL HIGH (ref 70–99)
Glucose-Capillary: 71 mg/dL (ref 70–99)
Glucose-Capillary: 71 mg/dL (ref 70–99)
Glucose-Capillary: 167 mg/dL — ABNORMAL HIGH (ref 70–99)

## 2013-07-18 LAB — RENAL FUNCTION PANEL
Albumin: 2.3 g/dL — ABNORMAL LOW (ref 3.5–5.2)
BUN: 12 mg/dL (ref 6–23)
Calcium: 8.5 mg/dL (ref 8.4–10.5)
Chloride: 103 mEq/L (ref 96–112)
GFR calc Af Amer: >90 mL/min (ref >90)
GFR calc non Af Amer: >90 mL/min (ref >90)
Glucose, Bld: 81 mg/dL (ref 70–99)
Phosphorus: 4.3 mg/dL (ref 2.3–4.6)
Potassium: 3.9 mEq/L (ref 3.5–5.1)
Sodium: 142 mEq/L (ref 135–145)

## 2013-07-18 LAB — VALPROIC ACID LEVEL: Valproic Acid Lvl: <10 ug/mL — ABNORMAL LOW (ref 50.0–100.0)

## 2013-07-18 MED ORDER — DEXTROSE 50 % IV SOLN
INTRAVENOUS | Status: AC
  Administered 2013-07-18: 50 mL
  Filled 2013-07-18: qty 50

## 2013-07-18 MED ORDER — SODIUM CHLORIDE 0.9 % IV SOLN
1000.0000 mg | Freq: Once | INTRAVENOUS | Status: AC
  Administered 2013-07-18: 1000 mg via INTRAVENOUS

## 2013-07-18 MED ORDER — VALPROATE SODIUM 500 MG/5ML IV SOLN
500.0000 mg | Freq: Two times a day (BID) | INTRAVENOUS | Status: DC
  Administered 2013-07-18 – 2013-07-21 (×6): 500 mg via INTRAVENOUS
  Filled 2013-07-18 (×9): qty 5

## 2013-07-18 MED ORDER — INSULIN DETEMIR 100 UNIT/ML ~~LOC~~ SOLN
40.0000 [IU] | Freq: Two times a day (BID) | SUBCUTANEOUS | Status: DC
  Administered 2013-07-18 – 2013-07-19 (×3): 40 [IU] via SUBCUTANEOUS
  Filled 2013-07-18 (×5): qty 0.4

## 2013-07-18 MED ORDER — DEXTROSE 5 % IV SOLN
1500.0000 mg | Freq: Once | INTRAVENOUS | Status: AC
  Administered 2013-07-18: 1500 mg via INTRAVENOUS
  Filled 2013-07-18: qty 15

## 2013-07-18 MED ORDER — KCL IN DEXTROSE-NACL 20-5-0.45 MEQ/L-%-% IV SOLN
INTRAVENOUS | Status: DC
  Administered 2013-07-18 – 2013-07-19 (×3): via INTRAVENOUS
  Filled 2013-07-18 (×6): qty 1000

## 2013-07-18 NOTE — Progress Notes (Signed)
At 1530, rounded on patient, she is so much more alert than earlier, answering questions (although incorrectly), following commands, asking for food/drink, which i gave her. Will cont to monitor

## 2013-07-18 NOTE — Progress Notes (Signed)
Subjective: Apparently had a conversation with PT yesterday, however this morning is once again only partially responsive.   Exam: Filed Vitals:   07/18/13 0630  BP: 142/64  Pulse: 88  Temp: 97.9 F (36.6 C)  Resp: 20   Gen: In bed, NAD MS: Awak, is able to answer some simple questions such as her name,  CN: Pupils equal round and reactive, extraocular movements intact, visual fields full (blinks to threat bilaterally) Motor: Moving both arms equally, though an*to do something with her left arm she sometimes will use her right. Sensory: Reports sensation in all 4 extremities  Impression: 72 year old female with acute encephalopathy that started in the setting of high fever and neck rigidity. Though she was treated with antibiotics for 2 days prior LP, if she had had a bacterial meningitis, I would have expected continued inflammation, though this does remain a possibility. The stroke seen on the recent MRI is tiny, and I would not expect it to be causing her current mental status changes.   Her left-sided neglect could be due to her right thalamic infarct, and this is really her major focal finding. There is some intermittent activity in the EEG which could represent seizures and therefore this point I would favor treating him empirically and seeing if she improves.  Recommendations: 1) Continue keppra 1gm BID 2) Will start depakote, 1500mg  x 1, then 500mg  BID.   Ritta Slot, MD [facility] [phone]  If 7pm- 7am, please page neurology on call at [phone].

## 2013-07-18 NOTE — Progress Notes (Signed)
Patient JX:BJYNW Regina [name]      DOB: 1941/01/07      GNF:[id number]  Subjective: patient was sitting up in recliner.  She could not identify her daugher by name.  She continues to neglect her left side.  She was able to speak a few word to me but is not spontaneous and needs to be prompted.   VS: 98.4, 18  100 %  93  155/60   General: flat affect, no acute distress , not spontanous with thought or speech PERRL, but appears to be neglecting left side Chest: decreased but clear , no rhochi or rales CVS: regular , distant, S1, S2 Abd: obese soft not tender or distended Ext: warm, moving both upper extremities but seemingly neglects left hand  Assessment and Plan:  72 yr old with acute encephalopathy of unclear cause.   MRI suggests stroke on left parietal lobe but his would cause the current constellation of symptoms.  Daughters current goals are watchful weighting with full treatment and reassess plans according to responses to treatment.  1.  DNR  2.  Seizures, Possibly: continue current antiepeliptics.  Re speak with Neuro in the near future to summarize thoughts for re-goals of care.  3.  Continue tube feedings.  Topic will be addressed again at [facility].   Total time: 15 min  Susa Bones L. Ladona Ridgel, MD MBA The Palliative Medicine Team at [facility] Phone: [phone] Pager: [phone]

## 2013-07-18 NOTE — Progress Notes (Signed)
Paged on call Olegario Messier concerning NG tube placement at 2200. Gastric residual check came out of pt mouth, took NG tube out. Olegario Messier confirmed no need for immediate replacement since pt had dys 1 diet placed today. Continued q4 CBG checks along with sliding scale. 0422 CBG check showed pt 22, gave 1 amp of D50 per protocol. Pt 127 at recheck. Cleaned pt, bathed, checked flexiseal and foley, linens changed, and pt settled back to bed. Checked CBG again at 0520 pt back down to 80. Paged Olegario Messier for further instructions and advised to give another amp of D50. Will check CBG again in 15 min and continue to monitor pt. Pt appropriate at baseline and only alert to self, as she has been since beginning of shift. Will pass along to day shift staff.

## 2013-07-18 NOTE — Progress Notes (Signed)
Speech Language Pathology Dysphagia Treatment Patient Details Name: Regina [name] MRN: [id number] DOB: [birth year] Today's Date: 07/18/2013 Time: 4098-1191 SLP Time Calculation (min): 27 min  Assessment / Plan / Recommendation Clinical Impression  Pt not as responsive today as seen during yesterday's visit. Pt visibly refusing POs despite max cues. Pt did accept approx 10 sips of water via straw with brief oral holding as well as 5 bites of yogurt. Then she turned head to side. Discouraged that NG tube came out as pt is unlikely to nutritionally support herself with waxing and waning mentation. Recommend continue current diet with close precautions.     Diet Recommendation  Continue with Current Diet: Dysphagia 1 (puree);Thin liquid    SLP Plan Continue with current plan of care   Pertinent Vitals/Pain NA   Swallowing Goals  SLP Swallowing Goals Patient will utilize recommended strategies during swallow to increase swallowing safety with: Total assistance;Maximal cueing Swallow Study Goal #2 - Progress: Progressing toward goal  General Temperature Spikes Noted: No Respiratory Status: Supplemental O2 delivered via (comment) Behavior/Cognition: Alert;Confused Oral Cavity - Dentition: Dentures, top Patient Positioning: Upright in bed  Oral Cavity - Oral Hygiene Does patient have any of the following "at risk" factors?: Oxygen therapy - cannula, mask, simple oxygen devices Patient is AT RISK - Oral Care Protocol followed (see row info): Yes   Dysphagia Treatment Treatment focused on: Skilled observation of diet tolerance;Facilitation of oral preparatory phase;Facilitation of oral phase;Utilization of compensatory strategies Treatment Methods/Modalities: Skilled observation;Differential diagnosis Patient observed directly with PO's: Yes Type of PO's observed: Dysphagia 1 (puree);Thin liquids Feeding: Total assist Liquids provided via: Straw Oral Phase Signs & Symptoms: Prolonged bolus  formation;Prolonged oral phase;Anterior loss/spillage Pharyngeal Phase Signs & Symptoms: Suspected delayed swallow initiation Type of cueing: Verbal;Tactile Amount of cueing: Maximal   GO    Harlon Ditty, MA CCC-SLP [phone]  Claudine Mouton 07/18/2013, 10:33 AM

## 2013-07-19 LAB — GLUCOSE, CAPILLARY
Glucose-Capillary: 253 mg/dL — ABNORMAL HIGH (ref 70–99)
Glucose-Capillary: 167 mg/dL — ABNORMAL HIGH (ref 70–99)
Glucose-Capillary: 123 mg/dL — ABNORMAL HIGH (ref 70–99)
Glucose-Capillary: 208 mg/dL — ABNORMAL HIGH (ref 70–99)

## 2013-07-19 LAB — CBC
HCT: 24.4 % — ABNORMAL LOW (ref 36.0–46.0)
Hemoglobin: 8.5 g/dL — ABNORMAL LOW (ref 12.0–15.0)
MCHC: 34.8 g/dL (ref 30.0–36.0)
MCV: 92.1 fL (ref 78.0–100.0)
RBC: 2.65 MIL/uL — ABNORMAL LOW (ref 3.87–5.11)
RDW: 15.6 % — ABNORMAL HIGH (ref 11.5–15.5)

## 2013-07-19 LAB — RENAL FUNCTION PANEL
BUN: 12 mg/dL (ref 6–23)
CO2: 29 mEq/L (ref 19–32)
Calcium: 8.1 mg/dL — ABNORMAL LOW (ref 8.4–10.5)
GFR calc Af Amer: >90 mL/min (ref >90)
Glucose, Bld: 167 mg/dL — ABNORMAL HIGH (ref 70–99)
Phosphorus: 4.2 mg/dL (ref 2.3–4.6)
Sodium: 139 mEq/L (ref 135–145)

## 2013-07-19 LAB — MAGNESIUM: Magnesium: 1.5 mg/dL (ref 1.5–2.5)

## 2013-07-19 LAB — VALPROIC ACID LEVEL: Valproic Acid Lvl: 76.3 ug/mL (ref 50.0–100.0)

## 2013-07-19 NOTE — Progress Notes (Signed)
[facility]                                                                                Patient Demographics  Regina [name], is a 72 y.o. female, DOB - Feb 12, 1941, FAO:[id number]  Admit date - 07/01/2013   Admitting Physician Elease Etienne, MD  Outpatient Primary MD for the patient is Michiel Sites, MD  LOS - 18   Chief Complaint  Patient presents with  . Fatigue        Assessment & Plan  Assessment/Plan:  #1acute encephalopathy  -Questionable etiology at this time.  Being followed by neurology.  May be secondary to seizures in the setting of recent CVA.  EEG done showing delta wave spikes. -Patient following simple commands.  -Vitamin B 12 Replaced  -PT to work with pt to evaluate and treat  -Continue AEDs at this time.  Currently on valproate and Keppra.  #2 probable seizure  -As listed above.   -official report:EEG is consistent with a Mild non-specific generalized cerebral dysfunction(encephalopathy). suspicion that the recurrent bursts of activity described above are associated with drowsiness and/or encephalopathy, but there is a  small chance that they could represent recurrent ictal discharges.  Continue keppra and valproate.  #3 possible meningitis/CSF infection  Patient currently afebrile.  She did have a rectal temperature of 102.9 (approximately one week ago). LP was done on 07/07/13.    - ID on board and discontinued antiviral medication and antibiotics based on low index of suspicion of infectious etiology as cause of presenting symptoms.  - Neurology on board  - WBC within normal limits on last check   #4 acute renal failure  Resolved. Likely secondary to volume depletion and hypotension in the setting of ARB. Arb and metformin held   #5 hypokalemia/hypomagnesemia  -Likely secondary to GI losses from C. difficile colitis and increased urinary output.  Will continue to monitor BMP.  #6 C. difficile colitis  Continue oral vancomycin per panda.  Follow.  -Per nursing staff decreased liquid consistency output with rectal foley.  - May consider removing rectal foley in next couple of days   #7 hypernatremia  Resolved, will continue to monitor.   Continue IVF to 75cc/hr follow  #8 Escherichia coli UTI  Patient received IV Rocephin x3 days.   #9 hypertension  D/C IV Lopressor. Resume home dose coreg and norvasc 5 mg daily.  -blood pressures relatively well controlled.  - continue to monitor and adjust antihypertensive medication as needed.   #10 history of CVA  Aggrenox held for lumbar puncture 2 days ago. Patient alert, platelets 116 on 07/16/13. Will defer to neurology, when to resume.   #11 diabetes mellitus  Hemoglobin A1c is 9.6.  - Continue Levemir and and ISS with accuchecks.  #12 hypothyroidism  Continue Synthroid and Cytomel.   #13 anxiety/depression  Stable.   #14 Nuitrition  Placed panda tube 9/29. On TF.  -SLP recommends dysphagia puree diet with thin liquids and strict precaution  - Panda to stay in place until intake is sufficient for nutritional support  - Patient with diarrhea which started after TF.   #15. Thrombocytopenia  Likely secondary to infection. No bleeding noted. Hold  lovenox. Follow.   #16 prophylaxis  Pepcid for GI prophylaxis. SCDs for DVT prophylaxis.    Principal Problem:   Acute encephalopathy Active Problems:   HYPOTHYROIDISM   DIABETES MELLITUS, TYPE II   HYPERLIPIDEMIA   UTI (urinary tract infection)   HTN (hypertension)   Anxiety and depression   CVA (cerebral infarction)   Weakness generalized   Dehydration   Acute renal failure   Failure to thrive   Diarrhea   Hyponatremia   Metabolic acidosis   Clostridium difficile colitis   Hypomagnesemia   Hypernatremia   Seizures   Meningitis, unspecified(322.9)   Protein-calorie malnutrition, severe   Thrombocytopenia, unspecified   Code Status: DNR  Family Communication: None  Disposition Plan: Neurology and  palliative following.  Pending further recommendations.   Procedures  MRI brain 8/27 >> small acute lacunar infarct in medial Rt thalamus, ? Punctate acute lacunar infarct central pons  CT head 07/05/2013  KUB 07/05/2013  KUB 07/01/2013  Chest x-ray 07/01/2013  MRI head 07/06/13  EEG 07/06/13  LP 07/07/13  PICC line 07/09/13  Consults   Nephrology: Dr. Eliott Nine 07/02/2013 Neurology : Dr Thad Ranger 07/06/13  ID: Dr Luciana Axe 07/09/13   DVT Prophylaxis  SCDs   Lab Results  Component Value Date   PLT 126* 07/19/2013    Medications  Scheduled Meds: . alteplase  2 mg Intracatheter Once  . amLODipine  10 mg Per NG tube Daily  . antiseptic oral rinse  15 mL Mouth Rinse q12n4p  . carvedilol  12.5 mg Per NG tube BID WC  . chlorhexidine  15 mL Mouth Rinse BID  . feeding supplement (ENSURE COMPLETE)  237 mL Oral BID BM  . insulin aspart  0-20 Units Subcutaneous Q4H  . insulin detemir  40 Units Subcutaneous BID  . latanoprost  1 drop Both Eyes QHS  . levETIRAcetam  1,000 mg Intravenous Once  . levETIRAcetam  1,000 mg Intravenous Q12H  . levothyroxine  75 mcg Per NG tube QAC breakfast  . liothyronine  25 mcg Per NG tube BID  . saccharomyces boulardii  250 mg Oral BID  . sodium chloride  10-40 mL Intracatheter Q12H  . sodium chloride  3 mL Intravenous Q12H  . valproate sodium  500 mg Intravenous Q12H  . vancomycin  125 mg Per Tube Q6H   Continuous Infusions: . dextrose 5 % and 0.45 % NaCl with KCl 20 mEq/L 75 mL/hr at 07/19/13 0442  . feeding supplement (JEVITY 1.2 CAL) 1,000 mL (07/16/13 0620)   PRN Meds:.acetaminophen, acetaminophen, albuterol, hydrALAZINE, LORazepam, ondansetron (ZOFRAN) IV, promethazine, sodium chloride, sodium chloride  Antibiotics    Anti-infectives   Start     Dose/Rate Route Frequency Ordered Stop   07/11/13 1800  vancomycin (VANCOCIN) 50 mg/mL oral solution 125 mg     125 mg Per Tube 4 times per day 07/11/13 1526     07/10/13 1100  metroNIDAZOLE (FLAGYL)  tablet 500 mg  Status:  Discontinued     500 mg Per Tube 3 times per day 07/10/13 1038 07/11/13 1526   07/10/13 1000  metroNIDAZOLE (FLAGYL) 50 mg/ml oral suspension 500 mg  Status:  Discontinued     500 mg Per Tube 3 times daily 07/10/13 0901 07/10/13 1038   07/08/13 1400  acyclovir (ZOVIRAX) 500 mg in dextrose 5 % 100 mL IVPB  Status:  Discontinued     500 mg 110 mL/hr over 60 Minutes Intravenous 3 times per day 07/08/13 1128 07/12/13 1459   07/07/13 1000  vancomycin (VANCOCIN) 1,500 mg in sodium chloride 0.9 % 500 mL IVPB  Status:  Discontinued     1,500 mg 250 mL/hr over 120 Minutes Intravenous Every 24 hours 07/06/13 0912 07/06/13 1508   07/07/13 1000  vancomycin (VANCOCIN) 1,250 mg in sodium chloride 0.9 % 250 mL IVPB  Status:  Discontinued     1,250 mg 166.7 mL/hr over 90 Minutes Intravenous Every 24 hours 07/06/13 1508 07/07/13 0834   07/07/13 1000  vancomycin (VANCOCIN) IVPB 750 mg/150 ml premix  Status:  Discontinued     750 mg 150 mL/hr over 60 Minutes Intravenous Every 12 hours 07/07/13 0835 07/12/13 1459   07/06/13 1530  acyclovir (ZOVIRAX) 500 mg in dextrose 5 % 100 mL IVPB  Status:  Discontinued     500 mg 110 mL/hr over 60 Minutes Intravenous Every 12 hours 07/06/13 1500 07/08/13 1128   07/06/13 1000  cefTRIAXone (ROCEPHIN) 2 g in dextrose 5 % 50 mL IVPB  Status:  Discontinued     2 g 100 mL/hr over 30 Minutes Intravenous Every 12 hours 07/06/13 0800 07/12/13 1459   07/06/13 0900  ampicillin (OMNIPEN) 2 g in sodium chloride 0.9 % 50 mL IVPB  Status:  Discontinued     2 g 150 mL/hr over 20 Minutes Intravenous Every 4 hours 07/06/13 0807 07/09/13 1108   07/06/13 0830  ampicillin (OMNIPEN) 2 g in sodium chloride 0.9 % 50 mL IVPB  Status:  Discontinued     2 g 150 mL/hr over 20 Minutes Intravenous 6 times per day 07/06/13 0800 07/06/13 0807   07/06/13 0830  vancomycin (VANCOCIN) 2,000 mg in sodium chloride 0.9 % 500 mL IVPB     2,000 mg 250 mL/hr over 120 Minutes Intravenous  STAT 07/06/13 0822 07/06/13 1048   07/04/13 0600  metroNIDAZOLE (FLAGYL) IVPB 500 mg  Status:  Discontinued     500 mg 100 mL/hr over 60 Minutes Intravenous Every 8 hours 07/04/13 0547 07/10/13 0901   07/01/13 1415  cefTRIAXone (ROCEPHIN) 1 g in dextrose 5 % 50 mL IVPB  Status:  Discontinued     1 g 100 mL/hr over 30 Minutes Intravenous Every 24 hours 07/01/13 1408 07/05/13 1548       Time Spent in minutes   30 minutes   Leighanna Kirn D.O. on 07/19/2013 at 11:59 AM  Between 7am to 7pm - Pager - [phone]  After 7pm go to [email or web address] - password [email or web address]  And look for the night coverage person covering for me after hours  [facility] Office  [phone]    Subjective:   Chestina Komatsu seen and examined today.   Patient minimally verbal.  Can answer some yes/no questions.  No complaints today. Objective:   Filed Vitals:   07/18/13 1443 07/18/13 2235 07/19/13 0535 07/19/13 0959  BP: 133/53 143/63 127/47 167/59  Pulse: 89 90 81 91  Temp: 98.6 F (37 C) 98.8 F (37.1 C) 98.7 F (37.1 C) 97.5 F (36.4 C)  TempSrc: Oral Oral Oral Oral  Resp: 20 18 20 20   Height:      Weight:      SpO2: 99% 98% 99% 99%    Wt Readings from Last 3 Encounters:  07/18/13 106 kg (233 lb 11 oz)  06/08/13 103.919 kg (229 lb 1.6 oz)  06/05/13 105.8 kg (233 lb 4 oz)     Intake/Output Summary (Last 24 hours) at 07/19/13 1159 Last data filed at 07/19/13 1030  Gross per 24 hour  Intake  825 ml  Output    631 ml  Net    194 ml    Exam  General: Well developed, well nourished, NAD, appears stated age  HEENT: NCAT, PERRLA, EOMI, Anicteic Sclera, mucous membranes moist.  Neck: Supple, no JVD  Cardiovascular: S1 S2 auscultated, 2/6 SEM. Regular rate and rhythm.  Respiratory: Clear to auscultation bilaterally with equal chest rise  Abdomen: Soft, obese, non-tender to palpation, + bowel sounds  Extremities: warm dry without cyanosis clubbing or edema  Neuro: Awake,  however, unable to assess.  Patient can follow simple commands.  Psych: Unable to assess.      Data Review   Micro Results No results found for this or any previous visit (from the past 240 hour(s)).  Radiology Reports Ct Angio Head W/cm &/or Wo Cm  07/15/2013   CLINICAL DATA:  Acute encephalopathy in the setting of high fever. Evaluate for cause of punctate acute infarction left parietal gyrus discovered 07/13/2013. Stroke risk factors include diabetes mellitus, and hypertension.  EXAM: CT ANGIOGRAPHY HEAD AND NECK  TECHNIQUE: Multidetector CT imaging of the head and neck was performed using the standard protocol during bolus administration of intravenous contrast. Multiplanar CT image reconstructions including MIPs were obtained to evaluate the vascular anatomy. Carotid stenosis measurements (when applicable) are obtained utilizing NASCET criteria, using the distal internal carotid diameter as the denominator.  CONTRAST:  50mL OMNIPAQUE IOHEXOL 350 MG/ML SOLN  COMPARISON:  MR brain 07/13/2013.  MRA head 06/09/2013.  FINDINGS: CTA HEAD FINDINGS  No visible acute large vessel infarct. Punctate infarct observed on MRI are not visible. No acute hemorrhage, mass lesion, hydrocephalus, or extra-axial fluid. Global atrophy with chronic microvascular ischemic change. No abnormal postcontrast enhancement. Internal carotid arteries widely patent. Basilar artery widely patent with right vertebral dominant. Critical stenosis distal left vertebral, greater than 90% does not involve the PICA, and is of doubtful significance given the dominant right vertebral  Small A1 ACA on the left. Robust right A1 ACA is dominant. Bilateral 50-75% pericallosal ACA stenoses.  50% stenosis M1 segment right middle cerebral artery. No left M1 MCA stenosis. Mild irregularity distal MCA and PCA branches consistent with intracranial atherosclerotic change but no distal branch occlusion.  90% stenosis origin P1 left PCA (image 18 series  [id number]), also moderate irregularity left greater than right P2 PCA segments.  No cerebellar branch occlusion.  Review of the MIP images confirms the above findings.  CTA NECK FINDINGS  Transverse arch atheromatous change without ulceration. Dolichoectasia great vessels without proximal stenosis. No measurable carotid stenosis with mild non stenotic predominantly calcific plaque bilaterally. No evidence for ICA dissection or fibromuscular dysplasia. Both vertebral arteries are patent from their origins through the neck with right dominant. No ostial stenosis.  Mild cervical spondylosis. No neck masses. Mild vascular congestion in the chest. No adenopathy. Nasogastric tube. No sinus or mastoid disease.  Review of the MIP images confirms the above findings.  IMPRESSION: CTA HEAD IMPRESSION  90% stenosis P1 segment left PCA could correlate with the observed pattern of infarction.  Critical stenosis distal left vertebral of doubtful significance, as the right vertebral is dominant.  No flow-limiting stenosis of the internal carotid arteries, basilar artery, or proximal ACA or MCA branches.  CTA NECK IMPRESSION  No extracranial flow reducing lesion is identified. Mild non stenotic atheromatous change at the bifurcations. No evidence for vertebral artery compromise. No extracranial dissection.   Electronically Signed   By: Davonna Belling M.D.   On: 07/15/2013 16:37  Dg Abd 1 View  07/09/2013   *RADIOLOGY REPORT*  Clinical Data: Band placement  ABDOMEN - 1 VIEW  Comparison: Prior abdominal radiograph earlier today at 11:49 a.m.  Findings: Frontal views of the abdomen demonstrate a weighted tip enteric feeding tube.  The tip of the tube overlies the right upper quadrant, likely within the first portion of the duodenum.  The bowel gas pattern is not obstructed.  No acute osseous abnormality. No large free air on this supine series.  IMPRESSION: The tip of the enteric feeding tube is likely in the first portion of the  duodenum.   Original Report Authenticated By: Malachy Moan, M.D.   Dg Abd 1 View  07/09/2013   CLINICAL DATA:  Post feeding tube placement  EXAM: ABDOMEN - 1 VIEW  COMPARISON:  Portable exam 1149 hr compared to 07/05/2013  FINDINGS: Tip of feeding tube projects over distal stomach.  EKG leads project over chest and abdomen.  Normal bowel gas pattern.  Atelectasis versus infiltrate left lung base.  IMPRESSION: Tip of feeding tube projects over distal stomach.   Electronically Signed   By: Ulyses Southward M.D.   On: 07/09/2013 12:04   Dg Abd 1 View  07/05/2013   CLINICAL DATA:  Nausea and vomiting  EXAM: ABDOMEN - 1 VIEW  COMPARISON:  July 01, 2013  FINDINGS: Normal bowel gas pattern. Negative for bowel obstruction. Lumbar degenerative changes. No acute bony abnormality. Degenerative change in the left hip joint.  IMPRESSION: Nonobstructive bowel gas pattern.   Electronically Signed   By: Marlan Palau M.D.   On: 07/05/2013 09:58   Ct Head Wo Contrast  07/05/2013   CLINICAL DATA:  Altered mental status  EXAM: CT HEAD WITHOUT CONTRAST  TECHNIQUE: Contiguous axial images were obtained from the base of the skull through the vertex without intravenous contrast.  COMPARISON:  MRI 06/08/2013, CT 06/07/2013.  FINDINGS: Moderate atrophy. Negative for hydrocephalus. Chronic microvascular ischemic change throughout the white matter. Chronic lacunar infarct left globus pallidus. Chronic ischemic change in the pons.  Negative for acute infarct. Negative for hemorrhage or mass. Calvarium is intact.  IMPRESSION: Atrophy and chronic microvascular ischemia. No acute abnormality.   Electronically Signed   By: Marlan Palau M.D.   On: 07/05/2013 15:11   Ct Angio Neck W/cm &/or Wo/cm  07/15/2013   CLINICAL DATA:  Acute encephalopathy in the setting of high fever. Evaluate for cause of punctate acute infarction left parietal gyrus discovered 07/13/2013. Stroke risk factors include diabetes mellitus, and hypertension.   EXAM: CT ANGIOGRAPHY HEAD AND NECK  TECHNIQUE: Multidetector CT imaging of the head and neck was performed using the standard protocol during bolus administration of intravenous contrast. Multiplanar CT image reconstructions including MIPs were obtained to evaluate the vascular anatomy. Carotid stenosis measurements (when applicable) are obtained utilizing NASCET criteria, using the distal internal carotid diameter as the denominator.  CONTRAST:  50mL OMNIPAQUE IOHEXOL 350 MG/ML SOLN  COMPARISON:  MR brain 07/13/2013.  MRA head 06/09/2013.  FINDINGS: CTA HEAD FINDINGS  No visible acute large vessel infarct. Punctate infarct observed on MRI are not visible. No acute hemorrhage, mass lesion, hydrocephalus, or extra-axial fluid. Global atrophy with chronic microvascular ischemic change. No abnormal postcontrast enhancement. Internal carotid arteries widely patent. Basilar artery widely patent with right vertebral dominant. Critical stenosis distal left vertebral, greater than 90% does not involve the PICA, and is of doubtful significance given the dominant right vertebral  Small A1 ACA on the left. Robust right A1 ACA  is dominant. Bilateral 50-75% pericallosal ACA stenoses.  50% stenosis M1 segment right middle cerebral artery. No left M1 MCA stenosis. Mild irregularity distal MCA and PCA branches consistent with intracranial atherosclerotic change but no distal branch occlusion.  90% stenosis origin P1 left PCA (image 18 series [id number]), also moderate irregularity left greater than right P2 PCA segments.  No cerebellar branch occlusion.  Review of the MIP images confirms the above findings.  CTA NECK FINDINGS  Transverse arch atheromatous change without ulceration. Dolichoectasia great vessels without proximal stenosis. No measurable carotid stenosis with mild non stenotic predominantly calcific plaque bilaterally. No evidence for ICA dissection or fibromuscular dysplasia. Both vertebral arteries are patent from their  origins through the neck with right dominant. No ostial stenosis.  Mild cervical spondylosis. No neck masses. Mild vascular congestion in the chest. No adenopathy. Nasogastric tube. No sinus or mastoid disease.  Review of the MIP images confirms the above findings.  IMPRESSION: CTA HEAD IMPRESSION  90% stenosis P1 segment left PCA could correlate with the observed pattern of infarction.  Critical stenosis distal left vertebral of doubtful significance, as the right vertebral is dominant.  No flow-limiting stenosis of the internal carotid arteries, basilar artery, or proximal ACA or MCA branches.  CTA NECK IMPRESSION  No extracranial flow reducing lesion is identified. Mild non stenotic atheromatous change at the bifurcations. No evidence for vertebral artery compromise. No extracranial dissection.   Electronically Signed   By: Davonna Belling M.D.   On: 07/15/2013 16:37   Mr Brain Wo Contrast  07/14/2013   CLINICAL DATA:  Altered mental status.  Intermittent seizures.  EXAM: MRI HEAD WITHOUT CONTRAST  TECHNIQUE: Multiplanar, multisequence MR imaging was performed. No intravenous contrast was administered.  COMPARISON:  07/06/2013.  06/08/2013.  FINDINGS: Diffusion imaging shows a punctate acute infarction along the surface of a left parietal gyrus at the vertex. No other acute infarction. Chronic ischemic changes for seen within the pons. There are old small vessel cerebellar infarctions. The old infarction in the right thalamus is evident. Old lacunar infarctions in the basal ganglia and chronic small vessel changes throughout the deep white matter remain evident. No large vessel territory infarction. No mass lesion, hemorrhage, hydrocephalus or extra-axial collection. No pituitary mass. No inflammatory sinus disease.  IMPRESSION: Newly seen punctate acute infarction along the surface of a left parietal gyrus at the vertex.  Old ischemic changes elsewhere throughout the brain as outlined above.   Electronically  Signed   By: Paulina Fusi M.D.   On: 07/14/2013 07:26   Mr Brain Wo Contrast  07/06/2013   CLINICAL DATA:  Altered mental status  EXAM: MRI HEAD WITHOUT CONTRAST  TECHNIQUE: Multiplanar, multisequence MR imaging was performed. No intravenous contrast was administered.  COMPARISON:  MRI 06/08/2013  FINDINGS: Image quality degraded by motion.  Negative for acute infarct. Diffusion-weighted imaging is diagnostic.  Generalized atrophy. Chronic microvascular ischemic changes in the white matter. Chronic infarcts in the basal ganglia and pons bilaterally. No hemorrhage or mass. No fluid collection.  Paranasal sinuses are clear.  IMPRESSION: Image quality degraded by motion. No acute abnormality.   Electronically Signed   By: Marlan Palau M.D.   On: 07/06/2013 13:34   Mr Lumbar Spine W Wo Contrast  07/13/2013   CLINICAL DATA:  Left-sided weakness.  Question injury or infection.  EXAM: MRI LUMBAR SPINE WITHOUT AND WITH CONTRAST  TECHNIQUE: Multiplanar and multiecho pulse sequences of the lumbar spine were obtained without and with intravenous contrast.  CONTRAST:  20mL MULTIHANCE GADOBENATE DIMEGLUMINE 529 MG/ML IV SOLN  COMPARISON:  None available.  FINDINGS: Normal signal is present in the conus medullaris which terminates at L1, within normal limits. Marrow signal is somewhat heterogeneous without a discrete lesion. Mild endplate changes are present at L3-4. No pathologic enhancement is present to suggest infection or mass. A remote superior endplate fracture is present at T12. No acute fractures are present. Limited imaging of the abdomen demonstrates thinning of the renal parenchyma on the left. No discrete lesions are present.  The disc levels at L2-3 and above are normal.  L3-4: A mild broad-based disc bulge is present. There is no significant stenosis.  L4-5: Negative.  L5-S1: Negative.  IMPRESSION: 1. Mild broad-based disk bulging and facet hypertrophy at L3-4 without significant stenosis. 2. No evidence  for infection or acute injury. 3. No acute or focal abnormality to explain the patient's symptoms.   Electronically Signed   By: Gennette Pac   On: 07/13/2013 15:42   US Renal  07/01/2013   CLINICAL DATA:  Acute renal failure  EXAM: RENAL/URINARY TRACT ULTRASOUND COMPLETE  COMPARISON:  None.  FINDINGS: Right Kidney  Length: Measures 12.8 cm in length. Echogenicity within normal limits. No mass or hydronephrosis visualized. Cyst within the inferior pole measures 1.6 x 1.4 x 1.5 cm.  Left Kidney  Length: Measures 12.1 cm. Echogenicity within normal limits. No mass or hydronephrosis visualized. Cyst within the upper pole measures 1.4 x 1.6 x 1.4 cm.  Bladder:  Collapsed around a Foley catheter.  IMPRESSION: 1. No hydronephrosis 2. Renal cysts.   Electronically Signed   By: Signa Kell M.D.   On: 07/01/2013 15:59   Dg Chest Port 1 View  07/06/2013   CLINICAL DATA:  Fever  EXAM: PORTABLE CHEST - 1 VIEW  COMPARISON:  07/01/2013  FINDINGS: Cardiomediastinal silhouette is stable. Central mild vascular congestion without pulmonary edema. There is poor inspiration with mild basilar atelectasis. Bony thorax is stable. No segmental infiltrate or pleural effusion.  IMPRESSION: Poor inspiration with mild basilar atelectasis. No segmental infiltrate or pulmonary edema. Central mild vascular congestion.   Electronically Signed   By: Natasha Mead   On: 07/06/2013 08:14   Dg Chest Port 1 View  07/01/2013   CLINICAL DATA:  Shortness of breath; hypertension  EXAM: PORTABLE CHEST - 1 VIEW  COMPARISON:  June 07, 2013  FINDINGS: There is no edema or consolidation. Heart size and pulmonary vascularity are normal. No adenopathy. There is degenerative change in both shoulders.  IMPRESSION: No edema or consolidation.   Electronically Signed   By: Bretta Bang   On: 07/01/2013 14:50   Dg Abd Portable 1v  07/01/2013   CLINICAL DATA:  Abdominal pain with nausea and diarrhea  EXAM: PORTABLE ABDOMEN - 1 VIEW  COMPARISON:   None.  FINDINGS: Bowel gas pattern is unremarkable. No obstruction or free air is seen on this supine examination. There is no abnormal calcification. There is arthropathy in the hip joints and lumbar spine.  Impression:  The bowel gas pattern is unremarkable.   Electronically Signed   By: Bretta Bang   On: 07/01/2013 14:48    CBC  Recent Labs Lab 07/14/13 0510 07/16/13 0500 07/19/13 0515  WBC 7.2 6.0 7.5  HGB 9.8* 9.4* 8.5*  HCT 29.7* 27.4* 24.4*  PLT 73* 116* 126*  MCV 94.6 93.2 92.1  MCH 31.2 32.0 32.1  MCHC 33.0 34.3 34.8  RDW 15.7* 15.8* 15.6*    Chemistries  Recent Labs Lab 07/15/13 0525 07/16/13 0500 07/17/13 0500 07/18/13 0515 07/19/13 0515  NA 139 141 137 142 139  K 4.2 4.3 4.2 3.9 4.2  CL 103 106 100 103 103  CO2 28 29 32 29 29  GLUCOSE 155* 139* 128* 81 167*  BUN 9 10 10 12 12   CREATININE 0.50 0.51 0.51 0.49* 0.57  CALCIUM 8.5 8.2* 8.6 8.5 8.1*  MG  --  1.5  --   --   --    ------------------------------------------------------------------------------------------------------------------ estimated creatinine clearance is 74.9 ml/min (by C-G formula based on Cr of 0.57). ------------------------------------------------------------------------------------------------------------------ No results found for this basename: HGBA1C,  in the last 72 hours ------------------------------------------------------------------------------------------------------------------ No results found for this basename: CHOL, HDL, LDLCALC, TRIG, CHOLHDL, LDLDIRECT,  in the last 72 hours ------------------------------------------------------------------------------------------------------------------ No results found for this basename: TSH, T4TOTAL, FREET3, T3FREE, THYROIDAB,  in the last 72 hours ------------------------------------------------------------------------------------------------------------------  Recent Labs  07/18/13 0515  VITAMINB12 914*    Coagulation  profile No results found for this basename: INR, PROTIME,  in the last 168 hours  No results found for this basename: DDIMER,  in the last 72 hours  Cardiac Enzymes No results found for this basename: CK, CKMB, TROPONINI, MYOGLOBIN,  in the last 168 hours ------------------------------------------------------------------------------------------------------------------ No components found with this basename: POCBNP,

## 2013-07-19 NOTE — Progress Notes (Addendum)
Patient is ready for d/c today per MD- contacted family to advise that there is no bed available at [facility] where they thought there was and she is wanting to speak with MD regarding concerns about her not being ready. I have  text paged MD to request he follow up with them.  Reece Levy, MSW [phone]

## 2013-07-19 NOTE — Progress Notes (Signed)
Palliative Medicine Team  Call received from CM re: discharge plans.Her goals of care are not clear at this time which makes her very high risk for re-admission as well as the following:   She is only following 10% of commands and SLP expresses concerns about her nutritional intake-she needs maximal cueing.  Additionally may be reasonable to monitor her regarding intake of her oral seizure medications for at least 24 hours.  Rectal tube was discontinued this AM, and since she has had 4-5 loose runny stools-her skin is starting to breakdown and is very red. Foley is still in place. This needs to be improved prior to D/C.   If she is going to be discharged tomorrow we need to discuss a plan for discharge to include at minimum palliative care services if not full hospice. I plan on speaking with daughter at 18 AM to further address goals of care.  I have discussed her care by phone with her daughter today.  Anderson Malta, DO Palliative Medicine

## 2013-07-19 NOTE — Progress Notes (Signed)
PT Cancellation Note  Patient Details Name: Regina [name] MRN: [id number] DOB: July 30, 1941   Cancelled Treatment:    Reason Eval/Treat Not Completed: Fatigue/lethargy limiting ability to participate. Patient not easily aroused. Was going to attempt to mobilize patient to see arouse response however when lifted sheet noticed that patient retal tube not working and patient soiled. RN made aware. Will reattempt as able.    Fredrich Birks 07/19/2013, 1:44 PM

## 2013-07-19 NOTE — Progress Notes (Signed)
Speech Language Pathology Dysphagia Treatment Patient Details Name: Regina [name] MRN: [id number] DOB: 08/21/1941 Today's Date: 07/19/2013 Time: 4098-1191 SLP Time Calculation (min): 21 min  Assessment / Plan / Recommendation Clinical Impression  Pt more responsive and willing to attempt a small amount of PO this am. Responsive to questions with a generalized yes without accuracy, following less than 10% of commands. Pt may continue current diet as no s/s of aspriation observed. Pt reportedly was more responsive in pm, but continue to be concerned about nutiritonal intake.     Diet Recommendation  Continue with Current Diet: Dysphagia 1 (puree);Thin liquid    SLP Plan Continue with current plan of care   Pertinent Vitals/Pain NA   Swallowing Goals  SLP Swallowing Goals Patient will utilize recommended strategies during swallow to increase swallowing safety with: Total assistance;Maximal cueing Swallow Study Goal #2 - Progress: Progressing toward goal  General Temperature Spikes Noted: No Respiratory Status: Supplemental O2 delivered via (comment) Behavior/Cognition: Alert;Confused Oral Cavity - Dentition: Dentures, top Patient Positioning: Upright in bed  Oral Cavity - Oral Hygiene     Dysphagia Treatment     GO    Harlon Ditty, MA CCC-SLP [phone]  Regina [name] 07/19/2013, 11:11 AM

## 2013-07-19 NOTE — Progress Notes (Signed)
NEURO HOSPITALIST PROGRESS NOTE   SUBJECTIVE:                                                                                                                        No new neurological developments. She is awake but doesn't interact or follows commands. On kepra 1000 mg IV BID, and depacon 500 mg IV BID.    OBJECTIVE:                                                                                                                           Vital signs in last 24 hours: Temp:  [97.5 F (36.4 C)-98.8 F (37.1 C)] 97.5 F (36.4 C) (10/08 0959) Pulse Rate:  [81-91] 91 (10/08 0959) Resp:  [18-20] 20 (10/08 0959) BP: (127-167)/(47-63) 167/59 mmHg (10/08 0959) SpO2:  [98 %-99 %] 99 % (10/08 0959)  Intake/Output from previous day: 10/07 0701 - 10/08 0700 In: 825 [I.V.:825] Out: 1380 [Urine:1030; Stool:350] Intake/Output this shift:   Nutritional status: Dysphagia  Past Medical History  Diagnosis Date  . Hypertension   . Hypercholesteremia   . TIA (transient ischemic attack) 1990's    "mini strokes" (06/07/2013)  . Legally blind     "both eyes; some vision in the right' (06/07/2013)  . Heart murmur   . Hypothyroidism   . Type II diabetes mellitus   . Anemia   . GERD (gastroesophageal reflux disease)   . Arthritis     "left leg" (06/07/2013)  . Depression   . Vulva cancer   . Altered mental status     "the first time I noticed any problem was 2 d ago" (06/07/2013)    Neurologic Exam:  MS: Awake but doesn't follows commands. CN: Pupils equal round and reactive, extraocular movements intact, visual fields full (blinks to threat bilaterally). Face seems to be symmetric.  Motor: Moving both arms equally, though an*to do something with her left arm she sometimes will use her right.  Sensory: withdraws to pain in all extremities. Coordination and gait: unable to test.   Lab Results: Lab Results  Component Value Date/Time   CHOL 211*  06/05/2013  6:50 AM   Lipid Panel No results found for this basename: CHOL, TRIG, HDL, CHOLHDL, VLDL,  LDLCALC,  in the last 72 hours  Studies/Results: No results found.  MEDICATIONS                                                                                                                       I have reviewed the patient's current medications.  ASSESSMENT/PLAN:                                                                                                           Encephalopathy, most likely multifactorial. Will suggest continuing current AEDs. Check valproic acid level. Could be able to go back to her facility if VPA therapeutic. Will follow up.  Wyatt Portela, MD [facility] [phone]  07/19/2013, 11:00 AM

## 2013-07-20 ENCOUNTER — Inpatient Hospital Stay (HOSPITAL_COMMUNITY): Payer: Medicare Other

## 2013-07-20 DIAGNOSIS — G9340 Encephalopathy, unspecified: Secondary | ICD-10-CM

## 2013-07-20 DIAGNOSIS — G039 Meningitis, unspecified: Secondary | ICD-10-CM

## 2013-07-20 DIAGNOSIS — R569 Unspecified convulsions: Secondary | ICD-10-CM

## 2013-07-20 DIAGNOSIS — Z515 Encounter for palliative care: Secondary | ICD-10-CM

## 2013-07-20 DIAGNOSIS — A0472 Enterocolitis due to Clostridium difficile, not specified as recurrent: Secondary | ICD-10-CM

## 2013-07-20 LAB — CBC
HCT: 23.8 % — ABNORMAL LOW (ref 36.0–46.0)
Hemoglobin: 8.3 g/dL — ABNORMAL LOW (ref 12.0–15.0)
MCH: 31.8 pg (ref 26.0–34.0)
MCHC: 34.9 g/dL (ref 30.0–36.0)
MCV: 91.2 fL (ref 78.0–100.0)
RBC: 2.61 MIL/uL — ABNORMAL LOW (ref 3.87–5.11)
RDW: 15.3 % (ref 11.5–15.5)

## 2013-07-20 LAB — BASIC METABOLIC PANEL
BUN: 9 mg/dL (ref 6–23)
Calcium: 8.6 mg/dL (ref 8.4–10.5)
Creatinine, Ser: 0.49 mg/dL — ABNORMAL LOW (ref 0.50–1.10)
GFR calc non Af Amer: >90 mL/min (ref >90)
Glucose, Bld: 66 mg/dL — ABNORMAL LOW (ref 70–99)
Sodium: 139 mEq/L (ref 135–145)

## 2013-07-20 LAB — GLUCOSE, CAPILLARY
Glucose-Capillary: 106 mg/dL — ABNORMAL HIGH (ref 70–99)
Glucose-Capillary: 136 mg/dL — ABNORMAL HIGH (ref 70–99)
Glucose-Capillary: 165 mg/dL — ABNORMAL HIGH (ref 70–99)
Glucose-Capillary: 174 mg/dL — ABNORMAL HIGH (ref 70–99)
Glucose-Capillary: 176 mg/dL — ABNORMAL HIGH (ref 70–99)
Glucose-Capillary: 199 mg/dL — ABNORMAL HIGH (ref 70–99)

## 2013-07-20 MED ORDER — CYANOCOBALAMIN 1000 MCG/ML IJ SOLN
1000.0000 ug | INTRAMUSCULAR | Status: DC
  Administered 2013-07-20: 1000 ug via INTRAMUSCULAR
  Filled 2013-07-20: qty 1

## 2013-07-20 MED ORDER — ASPIRIN-DIPYRIDAMOLE ER 25-200 MG PO CP12
1.0000 | ORAL_CAPSULE | Freq: Two times a day (BID) | ORAL | Status: DC
  Administered 2013-07-20 – 2013-07-22 (×5): 1 via ORAL
  Filled 2013-07-20 (×6): qty 1

## 2013-07-20 MED ORDER — RISAQUAD PO CAPS
2.0000 | ORAL_CAPSULE | Freq: Every day | ORAL | Status: DC
  Administered 2013-07-20 – 2013-07-22 (×3): 2 via ORAL
  Filled 2013-07-20 (×3): qty 2

## 2013-07-20 MED ORDER — LOPERAMIDE HCL 2 MG PO CAPS
2.0000 mg | ORAL_CAPSULE | Freq: Two times a day (BID) | ORAL | Status: AC
  Administered 2013-07-20 – 2013-07-21 (×4): 2 mg via ORAL
  Filled 2013-07-20 (×4): qty 1

## 2013-07-20 MED ORDER — INSULIN DETEMIR 100 UNIT/ML ~~LOC~~ SOLN
40.0000 [IU] | Freq: Every day | SUBCUTANEOUS | Status: DC
  Administered 2013-07-20 – 2013-07-21 (×2): 40 [IU] via SUBCUTANEOUS
  Filled 2013-07-20 (×3): qty 0.4

## 2013-07-20 NOTE — Progress Notes (Signed)
Routine EEG completed.  

## 2013-07-20 NOTE — Progress Notes (Signed)
NEURO HOSPITALIST PROGRESS NOTE   SUBJECTIVE:                                                                                                                        Remains neurologically unchanged. Currently sitting in a couch, awake, but incapable of following commands. On keppra 1000 mg IV BID and Depacon 500 mg IV BID. VPA therapeutic 76.3  OBJECTIVE:                                                                                                                           Vital signs in last 24 hours: Temp:  [97.5 F (36.4 C)-98.8 F (37.1 C)] 98.8 F (37.1 C) (10/09 0700) Pulse Rate:  [76-91] 76 (10/09 0700) Resp:  [20-22] 22 (10/09 0700) BP: (114-167)/(48-98) 114/66 mmHg (10/09 0700) SpO2:  [98 %-100 %] 98 % (10/09 0700)  Intake/Output from previous day: 10/08 0701 - 10/09 0700 In: -  Out: 701 [Urine:700; Stool:1] Intake/Output this shift: Total I/O In: -  Out: 800 [Urine:800] Nutritional status: Dysphagia  Past Medical History  Diagnosis Date  . Hypertension   . Hypercholesteremia   . TIA (transient ischemic attack) 1990's    "mini strokes" (06/07/2013)  . Legally blind     "both eyes; some vision in the right' (06/07/2013)  . Heart murmur   . Hypothyroidism   . Type II diabetes mellitus   . Anemia   . GERD (gastroesophageal reflux disease)   . Arthritis     "left leg" (06/07/2013)  . Depression   . Vulva cancer   . Altered mental status     "the first time I noticed any problem was 2 d ago" (06/07/2013)      Neurologic Exam:  Mental status: awake but doesn't follows commands. No taking or saying a simple word. CN: Pupils equal round and reactive, extraocular movements intact, visual fields full (blinks to threat bilaterally). Face seems to be symmetric.  Motor: Moving both arms equally Sensory: withdraws to pain in all extremities.  Coordination and gait: unable to test   Lab Results: Lab Results  Component  Value Date/Time   CHOL 211* 06/05/2013  6:50 AM   Lipid Panel No results found for this basename: CHOL,  TRIG, HDL, CHOLHDL, VLDL, LDLCALC,  in the last 72 hours  Studies/Results: No results found.  MEDICATIONS                                                                                                                       I have reviewed the patient's current medications.  ASSESSMENT/PLAN:                                                                                                           Patient is not clinically improving. It is hard to determine if this is exclusively an encephalopathic OR global dysphasia in the context of a new cerebrovascular insult vs subclinical seizures with language compromise and thus I am in favor of repeating MRI-DWI and EEG. Continue current AEDs. Will follow up.  Wyatt Portela, MD [facility] [phone]  07/20/2013, 9:57 AM

## 2013-07-20 NOTE — Progress Notes (Signed)
Physical Therapy Treatment Patient Details Name: EISLEY BARBER MRN: [id number] DOB: 11/04/1940 Today's Date: 07/20/2013 Time: 4098-1191 PT Time Calculation (min): 26 min  PT Assessment / Plan / Recommendation  History of Present Illness 72 y.o. female has had a complicated medical course over the last 1 month. Patient is unable to provide history due to weakness and? Dementia. History is provided by patient's daughter [name] who is at the bedside. 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 She was recently admitted twice at [facility]-06/04/13-06/06/13 (DKA) & 06/07/13-06/09/13 (metabolic encephalopathy, uncontrolled DM and CVA). Following the last discharge, patient was sent to rehabilitation where she was for approximately 3 weeks. Patient has had a sustained rapid decline at SNF. 2 days after going to SNF, she developed profuse diarrhea of 9 days duration, extremely poor appetite, lethargy, laying in bed and unwilling to participate in therapies, decreased urine output and progressive generalized weakness.    PT Comments   Patient demonstrates decline in functional status during today's session verse previous session. Patient with no vocalizations today.  Was able to follow simple one step commands for reaching, postural corrections, and there-ex, but could not sustain any activity without continued cues. Patient demonstrates no initiation of activity without cues. This is a change compared to prior session.  Patient does demonstrates active ROM in all extremities but requires max cues for response. Patient did respond with a nod when asked if she wanted to try to get in the chair. Will continue to see patient as indicated and progress as tolerated.   Follow Up Recommendations  SNF;Supervision/Assistance - 24 hour           Equipment Recommendations  Other (comment) (TBD upon ambulation  evaluation)    Recommendations for Other Services OT consult  Frequency Min 3X/week   Progress towards PT Goals Progress towards PT goals: Not progressing toward goals - comment  Plan Current plan remains appropriate    Precautions / Restrictions Precautions Precautions: Fall Restrictions Weight Bearing Restrictions: No   Pertinent Vitals/Pain NAD    Mobility  Bed Mobility Bed Mobility: Supine to Sit;Sitting - Scoot to Edge of Bed Supine to Sit: 1: +2 Total assist Supine to Sit: Patient Percentage: 10% Sitting - Scoot to Edge of Bed: 1: +2 Total assist Sitting - Scoot to Edge of Bed: Patient Percentage: 10% Details for Bed Mobility Assistance: declined over prior session Transfers Transfers: Sit to Stand;Stand to Sit;Stand Pivot Transfers Sit to Stand: 1: +2 Total assist;From bed;With upper extremity assist Sit to Stand: Patient Percentage: 10% Stand to Sit: 1: +2 Total assist;To chair/3-in-1;To bed;With upper extremity assist Stand to Sit: Patient Percentage: 10% Stand Pivot Transfers: 1: +2 Total assist Stand Pivot Transfers: Patient Percentage: 10% Details for Transfer Assistance: declined verse last session Ambulation/Gait Ambulation/Gait Assistance: Not tested (comment)    Exercises General Exercises - Lower Extremity Ankle Circles/Pumps: PROM;Both;5 reps Long Arc Quad: AROM;AAROM;Both;5 reps;Seated (patient with max cues to initiate, could not sustain activit) Other Exercises Other Exercises: trunk leans, patient able to follow commands for fwd flexion with trunk control     PT Goals (current goals can now be found in the care plan section) Acute Rehab PT Goals Patient Stated Goal: none stated PT Goal Formulation: With patient/family Time For Goal Achievement: 07/25/13 Potential to Achieve Goals: Poor  Visit Information  Last PT Received On: 07/20/13 Assistance Needed: +3 or more History of Present Illness: 72 y.o.  female has had a complicated medical course  over the last 1 month. Patient is unable to provide history due to weakness and? Dementia. History is provided by patient's daughter [name] who is at the bedside. 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 She was recently admitted twice at [facility]-06/04/13-06/06/13 (DKA) & 06/07/13-06/09/13 (metabolic encephalopathy, uncontrolled DM and CVA). Following the last discharge, patient was sent to rehabilitation where she was for approximately 3 weeks. Patient has had a sustained rapid decline at SNF. 2 days after going to SNF, she developed profuse diarrhea of 9 days duration, extremely poor appetite, lethargy, laying in bed and unwilling to participate in therapies, decreased urine output and progressive generalized weakness.     Subjective Data  Subjective: non verbal throughout session today Patient Stated Goal: none stated   Cognition  Cognition Arousal/Alertness: Awake/alert Behavior During Therapy: Flat affect Overall Cognitive Status: Impaired/Different from baseline Area of Impairment: Orientation;Following commands;Problem solving;Attention Orientation Level: Disoriented to;Time;Situation Current Attention Level: Sustained ( less then 30 seconds) Following Commands: Follows one step commands consistently Problem Solving: Slow processing    Balance  Balance Balance Assessed: Yes Static Sitting Balance Static Sitting - Balance Support: Right upper extremity supported;Feet supported Static Sitting - Level of Assistance: 5: Stand by assistance Dynamic Sitting Balance Dynamic Sitting - Balance Support: Feet supported;During functional activity Dynamic Sitting - Level of Assistance: 4: Min assist Dynamic Sitting - Balance Activities: Lateral lean/weight shifting;Forward lean/weight shifting;Trunk control activities Dynamic Sitting - Comments: 6 minutes at EOB  End of Session PT -  End of Session Equipment Utilized During Treatment: Oxygen Activity Tolerance: Patient limited by fatigue Patient left: in chair;with call bell/phone within reach Nurse Communication: Mobility status   GP     Fabio Asa 07/20/2013, 10:59 AM Charlotte Crumb, PT DPT  [phone]

## 2013-07-20 NOTE — Progress Notes (Signed)
[facility] PROGRESS NOTE  Regina [name] Ennis ZOX:[id number] DOB: 04/25/1941 DOA: 07/01/2013 PCP: Michiel Sites, MD  Assessment/Plan:  "72 y.o. female has had a complicated medical course over the last 1 month. Patient is unable to provide history due to weakness and? Dementia. History is provided by patient's daughter [name] who is at the bedside. 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 She was recently admitted twice at [facility]-06/04/13-06/06/13 (DKA) & 06/07/13-06/09/13 (metabolic encephalopathy, uncontrolled DM and CVA). Following the last discharge, patient was sent to rehabilitation where she was for approximately 3 weeks. Patient has had a sustained rapid decline at SNF. 2 days after going to SNF, she developed profuse diarrhea of 9 days duration, extremely poor appetite, lethargy, laying in bed and unwilling to participate in therapies, decreased urine output and progressive generalized weakness"  -admitted with encephalopathy, UTI, c diff  1.  Acute on chronic encephalopathy; progressive worse ? Etiology; possible recent mening ist is on top of CVA+/- seizure; encephalopathic since 06/04/13 per H&P;  -MRI (10/2): new acute infarct of a left parietal gyrus at the vertex; old infarcts at pons, R thalamus, basal ganglia and chronic small vessel changes throughout the deep white matter  -EEG (10/4): EEG is consistent with a Mild non-specific generalized cerebral dysfunction(encephalopathy)  -neurology following; repeating EEG, MRI  2. Probable seizure ; per neurology : On keppra 1000 mg IV BID and Depacon 500 mg IV BID.  3. Possible meningitis/CSF infection antibiotics, antiviral d/s per ID; LP unremarkable;   4. AKI resolved; likely prerenal/diarrhea   5. C diff colitis; on PO vanc;   6. Escherichia coli UTI Patient received IV Rocephin x3 days.   7. HTN D/C IV  Lopressor. Resume home dose coreg and norvasc daily.   8. Acute CVA; and history of CVA; MRI as above; resume aggrenox   9. DM Hemoglobin A1c is 9.6. Continue Levemir and and ISS with accuchecks.   10. Hypothyroidism; is Synthroid and Cytomel. But recent TSH 0.03; free T4-0.6; repeat TFTs  11. Anemia; check iron profile   Code Status: DNR Family Communication: d/w daughter at [phone] (indicate person spoken with, relationship, and if by phone, the number) Disposition Plan: SNF whne stable    Consultants: Nephrology: Dr. Eliott Nine 07/02/2013  Neurology : Dr Thad Ranger 07/06/13  ID: Dr Luciana Axe 07/09/13  Antibiotics:  IV Rocephin 07/01/2013--> 07/05/2013  IV Flagyl 07/04/2013-->07/10/13  Oral flagyl 07/10/13-9/30  IV vancomycin 07/06/2013-9/30  IV Rocephin 07/06/2013- 9/30  IV ampicillin 07/06/2013--->07/09/13  IV Acyclovir 07/06/13- 9/30  Oral vancomycin 9/30  Procedures: MRI brain 8/27 >> small acute lacunar infarct in medial Rt thalamus, ? Punctate acute lacunar infarct central pons  CT head 07/05/2013  KUB 07/05/2013  KUB 07/01/2013  Chest x-ray 07/01/2013  MRI head 07/06/13  EEG 07/06/13  LP 07/07/13  PICC line 07/09/13  HPI/Subjective: Somnolent; does not follow commands   Objective: Filed Vitals:   07/20/13 0700  BP: 114/66  Pulse: 76  Temp: 98.8 F (37.1 C)  Resp: 22    Intake/Output Summary (Last 24 hours) at 07/20/13 1017 Last data filed at 07/20/13 0800  Gross per 24 hour  Intake      0 ml  Output   1501 ml  Net  -1501 ml   Filed Weights   07/15/13 0746 07/16/13 0500 07/18/13 0500  Weight: 105.9 kg (233 lb 7.5 oz) 105 kg (231 lb 7.7 oz) 106 kg (233 lb 11  oz)    Exam:   General:  Alert   Cardiovascular: s1,s2 rr  Respiratory: CTA BL  Abdomen: soft, nt, nd   Musculoskeletal: no le edema    Data Reviewed: Basic Metabolic Panel:  Recent Labs Lab 07/15/13 0525 07/16/13 0500 07/17/13 0500 07/18/13 0515 07/19/13 0515 07/19/13 1251  07/20/13 0615  NA 139 141 137 142 139  --  139  K 4.2 4.3 4.2 3.9 4.2  --  3.6  CL 103 106 100 103 103  --  104  CO2 28 29 32 29 29  --  28  GLUCOSE 155* 139* 128* 81 167*  --  66*  BUN 9 10 10 12 12   --  9  CREATININE 0.50 0.51 0.51 0.49* 0.57  --  0.49*  CALCIUM 8.5 8.2* 8.6 8.5 8.1*  --  8.6  MG  --  1.5  --   --   --  1.5  --   PHOS 4.1 4.3 4.6 4.3 4.2  --   --    Liver Function Tests:  Recent Labs Lab 07/15/13 0525 07/16/13 0500 07/17/13 0500 07/18/13 0515 07/19/13 0515  ALBUMIN 2.3* 2.0* 2.2* 2.3* 2.0*   No results found for this basename: LIPASE, AMYLASE,  in the last 168 hours No results found for this basename: AMMONIA,  in the last 168 hours CBC:  Recent Labs Lab 07/14/13 0510 07/16/13 0500 07/19/13 0515 07/20/13 0615  WBC 7.2 6.0 7.5 5.3  HGB 9.8* 9.4* 8.5* 8.3*  HCT 29.7* 27.4* 24.4* 23.8*  MCV 94.6 93.2 92.1 91.2  PLT 73* 116* 126* 127*   Cardiac Enzymes: No results found for this basename: CKTOTAL, CKMB, CKMBINDEX, TROPONINI,  in the last 168 hours BNP (last 3 results) No results found for this basename: PROBNP,  in the last 8760 hours CBG:  Recent Labs Lab 07/19/13 1701 07/19/13 2018 07/20/13 0042 07/20/13 0417 07/20/13 0753  GLUCAP 136* 174* 165* 106* 59*    Recent Results (from the past 240 hour(s))  CLOSTRIDIUM DIFFICILE BY PCR     Status: None   Collection Time    07/19/13  4:22 PM      Result Value Range Status   C difficile by pcr NEGATIVE  NEGATIVE Final     Studies: No results found.  Scheduled Meds: . acidophilus  2 capsule Oral Daily  . alteplase  2 mg Intracatheter Once  . amLODipine  10 mg Per NG tube Daily  . antiseptic oral rinse  15 mL Mouth Rinse q12n4p  . carvedilol  12.5 mg Per NG tube BID WC  . chlorhexidine  15 mL Mouth Rinse BID  . cyanocobalamin  1,000 mcg Intramuscular Q30 days  . feeding supplement (ENSURE COMPLETE)  237 mL Oral BID BM  . insulin aspart  0-20 Units Subcutaneous Q4H  . insulin detemir   40 Units Subcutaneous BID  . latanoprost  1 drop Both Eyes QHS  . levETIRAcetam  1,000 mg Intravenous Once  . levETIRAcetam  1,000 mg Intravenous Q12H  . levothyroxine  75 mcg Per NG tube QAC breakfast  . liothyronine  25 mcg Per NG tube BID  . loperamide  2 mg Oral BID  . saccharomyces boulardii  250 mg Oral BID  . sodium chloride  10-40 mL Intracatheter Q12H  . sodium chloride  3 mL Intravenous Q12H  . valproate sodium  500 mg Intravenous Q12H  . vancomycin  125 mg Per Tube Q6H   Continuous Infusions: . 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 If 7PM-7AM, please contact night-coverage at [email or web address], password [facility] 07/20/2013, 10:17 AM  LOS: 19 days

## 2013-07-20 NOTE — Progress Notes (Addendum)
Occupational Therapy Treatment Patient Details Name: Regina [name] MRN: [id number] DOB: Sep 07, 1941 Today's Date: 07/20/2013 Time: 7829-5621 OT Time Calculation (min): 13 min  OT Assessment / Plan / Recommendation  History of present illness 72 y.o. female has had a complicated medical course over the last 1 month. Patient is unable to provide history due to weakness and? Dementia. History is provided by patient's daughter [name] who is at the bedside. 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 She was recently admitted twice at [facility]-06/04/13-06/06/13 (DKA) & 06/07/13-06/09/13 (metabolic encephalopathy, uncontrolled DM and CVA). Following the last discharge, patient was sent to rehabilitation where she was for approximately 3 weeks. Patient has had a sustained rapid decline at SNF. 2 days after going to SNF, she developed profuse diarrhea of 9 days duration, extremely poor appetite, lethargy, laying in bed and unwilling to participate in therapies, decreased urine output and progressive generalized weakness.    OT comments  Pt up in chair for OT session.  Focus of session on grooming tasks.  Pt somewhat resistive with therapist attempts to get pt to participate in ADL.  Pt using bil UEs to push therapists hands out of the way so she could reach with both hands and pull blanket up over her shoulders- demonstrating some intentional movement in bil UE.   Follow Up Recommendations  SNF;Supervision/Assistance - 24 hour    Barriers to Discharge       Equipment Recommendations  None recommended by OT    Recommendations for Other Services    Frequency Min 2X/week   Progress towards OT Goals Progress towards OT goals: Progressing toward goals  Plan Discharge plan remains appropriate    Precautions / Restrictions Precautions Precautions: Fall   Pertinent Vitals/Pain See vitals    ADL  Grooming: Performed;Wash/dry face;Wash/dry hands;Brushing hair;+1 Total assistance;Maximal assistance Where Assessed - Grooming: Supported sitting Transfers/Ambulation Related to ADLs: Pt up in chair on OT arrival. ADL Comments: Focus of session on grooming tasks while pt up in chair.  Asked pt to wash hands and offered wash cloth, pt initiated reaching for washcloth using bil hands together but once obtaining washcloth, pt required +1 total assist to complete task.  Placed comb in pt's hand and attempted brushing hair with hand over hand assist, but pt resisting (with left UE) and pushing therapist away. Pt pushed therapist away several times and then would reach for blanket using bil UEs together and then pulled blanket up over shoulders.  Demonstrating use of LUE but difficult to assess this session due to pt resistive to participating in grooming tasks.    OT Diagnosis:    OT Problem List:   OT Treatment Interventions:     OT Goals(current goals can now be found in the care plan section) Acute Rehab OT Goals Patient Stated Goal: none stated OT Goal Formulation: Patient unable to participate in goal setting Time For Goal Achievement: 07/26/13 Potential to Achieve Goals: Good ADL Goals Pt Will Perform Eating: with min assist;sitting Pt Will Perform Grooming: with mod assist;sitting Pt Will Perform Upper Body Bathing: with min assist;sitting Pt Will Perform Lower Body Bathing: with mod assist;sitting/lateral leans;sit to/from stand Pt Will Transfer to Toilet: with mod assist;stand pivot transfer;bedside commode Pt Will Perform Toileting - Clothing Manipulation and hygiene: with mod assist;sit to/from stand Pt/caregiver will Perform Home Exercise Program: Both right and left upper extremity;Increased ROM;Increased strength;With minimal assist Additional ADL Goal #1: Pt  tolerate supine to EOB with max assist in prep for ADL  Visit Information  Last OT Received On: 07/20/13 Assistance  Needed: +3 or more History of Present Illness: 72 y.o. female has had a complicated medical course over the last 1 month. Patient is unable to provide history due to weakness and? Dementia. History is provided by patient's daughter [name] who is at the bedside. 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 She was recently admitted twice at [facility]-06/04/13-06/06/13 (DKA) & 06/07/13-06/09/13 (metabolic encephalopathy, uncontrolled DM and CVA). Following the last discharge, patient was sent to rehabilitation where she was for approximately 3 weeks. Patient has had a sustained rapid decline at SNF. 2 days after going to SNF, she developed profuse diarrhea of 9 days duration, extremely poor appetite, lethargy, laying in bed and unwilling to participate in therapies, decreased urine output and progressive generalized weakness.     Subjective Data      Prior Functioning       Cognition  Cognition Arousal/Alertness: Awake/alert Behavior During Therapy: Flat affect Overall Cognitive Status: Impaired/Different from baseline Area of Impairment: Following commands;Awareness;Attention Current Attention Level: Sustained Following Commands: Follows one step commands inconsistently;Follows one step commands with increased time Problem Solving: Slow processing;Requires verbal cues;Requires tactile cues    Mobility  Bed Mobility Bed Mobility:  (pt up in chair)    Exercises      Balance     End of Session OT - End of Session Activity Tolerance:  (limited participation) Patient left: in chair;with call bell/phone within reach  GO    07/20/2013 Cipriano Mile OTR/L Pager [phone] Office [phone]  Cipriano Mile 07/20/2013, 2:20 PM

## 2013-07-20 NOTE — Progress Notes (Signed)
Regina [name] continues to be extremely encephalopathic with minimal improvement and worsening functional status. I have reviewed her medical records in detail and spoke with Dr. York Spaniel and Dr. Georga Hacking regarding her very complex medical condition and ongoing work up. Family is quite distressed feeling that they do not have a good understanding of her current condition or unified prognosis/diagnosis. Her infection status has improved and she is now C. Diff PCR negative, still having very loose stools. Metabolically she continues to have significant derangements with abnormal thyroid tests, persistent anemia, thrombocytopenia and also hypoglycemia secondary to poor PO intake.  Current Active Issues:  1. Worsening Anemia, of unknown etiology, has elevated LDH, low haptoglobin thrombocytopenia, low B12, no FOBT 12.6-->now 8.3.  2. Persistent encephalopathy, ID work-up negative, no clear source of CNS infection, new small stroke that cannot explain symptoms-left neglect noticed.Spoke with neurology-will repeat MRI and EEG, subclinical seizures possible.  3. Dysphagia related to #2, comfort feeding  4. C. Diff PCR is NEGATIVE, still having diarrhea, low mag and low K  5. Hypothyroid-T3 and T4 low on admission labs- started on Cytomel-need to check T3 levels and T4 interval.  6. Has history of Vulvar cancer-status unknown-malignancy status important for goal setting.  7. Need to adjust insulin for decreased PO intake.   Goals of family remain DNR, and to find a medical explaination for her acute decline, she was independent prior to coming in the hospital-she lived alone, helped her grand-daughter with her homework every evening-this is extremely different and family need more information in order to move on and make plans for her care. Plan for rehab following hospitalization.  Will follow.  Anderson Malta, DO Palliative Medicine

## 2013-07-20 NOTE — Progress Notes (Signed)
Inpatient Diabetes Program Recommendations  AACE/ADA: New Consensus Statement on Inpatient Glycemic Control (2013)  Target Ranges:  Prepandial:   less than 140 mg/dL      Peak postprandial:   less than 180 mg/dL (1-2 hours)      Critically ill patients:  140 - 180 mg/dL     Results for PAILYN, BELLEVUE (MRN [id number]) as of 07/20/2013 12:42  Ref. Range 07/20/2013 00:42 07/20/2013 04:17 07/20/2013 07:53 07/20/2013 12:09  Glucose-Capillary Latest Range: 70-99 mg/dL 469 (H) 629 (H) 59 (L) 59 (L)    **Hypoglycemic x2 episodes this morning.  PO intake poor per RN.  No tube feeds at present.  **Called Dr. York Spaniel to discuss.  Dr. York Spaniel gave me orders to decrease Levemir to 40 units once daily at bedtime.  Orders entered.  RN alerted to new orders.   Will follow. Ambrose Finland RN, MSN, CDE Diabetes Coordinator Inpatient Diabetes Program Team Pager: [phone] (8a-10p)

## 2013-07-20 NOTE — Progress Notes (Signed)
Advised that patient is likely ready for SNF tomorrow- per family request, FL2 refaxed out to area facilities for additional offers- family is hopeful that [facility] will have a bed but is prepared to accept other offers as well.   Reece Levy, MSW [phone]

## 2013-07-21 ENCOUNTER — Inpatient Hospital Stay (HOSPITAL_COMMUNITY): Payer: Medicare Other

## 2013-07-21 LAB — CBC
HCT: 27.5 % — ABNORMAL LOW (ref 36.0–46.0)
Hemoglobin: 9.4 g/dL — ABNORMAL LOW (ref 12.0–15.0)
MCH: 31.4 pg (ref 26.0–34.0)
MCHC: 34.2 g/dL (ref 30.0–36.0)
RDW: 15.1 % (ref 11.5–15.5)

## 2013-07-21 LAB — T4, FREE: Free T4: 0.68 ng/dL — ABNORMAL LOW (ref 0.80–1.80)

## 2013-07-21 LAB — GLUCOSE, CAPILLARY
Glucose-Capillary: 124 mg/dL — ABNORMAL HIGH (ref 70–99)
Glucose-Capillary: 211 mg/dL — ABNORMAL HIGH (ref 70–99)

## 2013-07-21 MED ORDER — DEXTROSE 50 % IV SOLN: 25.0000 mL | Freq: Once | INTRAVENOUS | Status: AC | PRN

## 2013-07-21 MED ORDER — DEXTROSE 50 % IV SOLN
INTRAVENOUS | Status: AC
  Administered 2013-07-21: 25 mL
  Filled 2013-07-21: qty 50

## 2013-07-21 NOTE — Progress Notes (Signed)
Rounded on pt after receiving report from [facility], RN noted that pt currently asleep.  RR even at 20.  Will continue to monitor.  Amanda Pea, Charity fundraiser.

## 2013-07-21 NOTE — Progress Notes (Addendum)
[facility] PROGRESS NOTE  Regina [name] ZOX:[id number] DOB: 1941/02/15 DOA: 07/01/2013 PCP: Michiel Sites, MD  Assessment/Plan:  "72 y.o. female has had a complicated medical course over the last 1 month. Patient is unable to provide history due to weakness and? Dementia. History is provided by patient's daughter Ms. Regina [name] who is at the bedside. 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 She was recently admitted twice at [facility]-06/04/13-06/06/13 (DKA) & 06/07/13-06/09/13 (metabolic encephalopathy, uncontrolled DM and CVA). Following the last discharge, patient was sent to rehabilitation where she was for approximately 3 weeks. Patient has had a sustained rapid decline at SNF. 2 days after going to SNF, she developed profuse diarrhea of 9 days duration, extremely poor appetite, lethargy, laying in bed and unwilling to participate in therapies, decreased urine output and progressive generalized weakness"  -admitted with encephalopathy, UTI, c diff  1.  Acute on chronic encephalopathy; progressive worse likely related to CVA; on top of old CVA, and +/- meningitis/sepsis encephalopathic since 06/04/13 per H&P;  -MRI (10/2): new acute infarct of a left parietal gyrus at the vertex; old infarcts at pons, R thalamus, basal ganglia and chronic small vessel changes throughout the deep white matter  -repeat MRI (10/10): New tiny mid left frontal lobe infarct -EEG (10/4): EEG is consistent with a Mild non-specific generalized cerebral dysfunction(encephalopathy) repeat EEG: normal; repeat EEG; no seizure  -antiepileptics --> somnolence; d/w neurology hold meds (keppra, depacon)  2. Probable seizure ; per neurology : was on keppra 1000 mg IV BID and Depacon 500 mg IV BID. -repeat EEG normal; hold antiepileptics   3. Possible meningitis/CSF infection antibiotics, antiviral d/s per ID; LP  unremarkable;   4. AKI resolved; likely prerenal/diarrhea   5. C diff colitis; on PO vanc; improved   6. Escherichia coli UTI Patient received IV Rocephin x3 days.   7. HTN D/C IV Lopressor. Resume home dose coreg and norvasc daily.   8. Acute CVA; and history of CVA; MRI as above; resume aggrenox; obtain echo;   9. DM Hemoglobin A1c is 9.6. Continue Levemir and and ISS with accuchecks.   10. Hypothyroidism; is Synthroid and Cytomel. But recent TSH 0.03; free T4-0.6; repeat TFTs; hypothermia, check cortisol, may need acth test   11. Anemia; check iron profile  12. Nuitrition  Was on panda tube 9/29. On TF. Off since 10/6; holding antiepileptics; if not able to eat ; will d/w family if want to pursue PEG tube feeding  -d/w her daughter patient never wanted alternative feeding; cont encourage Po intake with assistance    Code Status: DNR Family Communication: d/w daughter at [phone] (indicate person spoken with, relationship, and if by phone, the number) Disposition Plan: SNF whne stable likely in AM 10/11   Consultants: Nephrology: Dr. Eliott Nine 07/02/2013  Neurology : Dr Thad Ranger 07/06/13  ID: Dr Luciana Axe 07/09/13  Antibiotics:  IV Rocephin 07/01/2013--> 07/05/2013  IV Flagyl 07/04/2013-->07/10/13  Oral flagyl 07/10/13-9/30  IV vancomycin 07/06/2013-9/30  IV Rocephin 07/06/2013- 9/30  IV ampicillin 07/06/2013--->07/09/13  IV Acyclovir 07/06/13- 9/30  Oral vancomycin 9/30  Procedures: MRI brain 8/27 >> small acute lacunar infarct in medial Rt thalamus, ? Punctate acute lacunar infarct central pons  CT head 07/05/2013  KUB 07/05/2013  KUB 07/01/2013  Chest x-ray 07/01/2013  MRI head 07/06/13  EEG 07/06/13  LP 07/07/13  PICC line 07/09/13  HPI/Subjective: Somnolent; does not follow commands   Objective: Filed Vitals:  07/21/13 0540  BP: 129/36  Pulse: 88  Temp: 98.3 F (36.8 C)  Resp: 20    Intake/Output Summary (Last 24 hours) at 07/21/13 0839 Last data filed at  07/20/13 2200  Gross per 24 hour  Intake      0 ml  Output      2 ml  Net     -2 ml   Filed Weights   07/15/13 0746 07/16/13 0500 07/18/13 0500  Weight: 105.9 kg (233 lb 7.5 oz) 105 kg (231 lb 7.7 oz) 106 kg (233 lb 11 oz)    Exam:   General:  Alert   Cardiovascular: s1,s2 rr  Respiratory: CTA BL  Abdomen: soft, nt, nd   Musculoskeletal: no le edema    Data Reviewed: Basic Metabolic Panel:  Recent Labs Lab 07/15/13 0525 07/16/13 0500 07/17/13 0500 07/18/13 0515 07/19/13 0515 07/19/13 1251 07/20/13 0615  NA 139 141 137 142 139  --  139  K 4.2 4.3 4.2 3.9 4.2  --  3.6  CL 103 106 100 103 103  --  104  CO2 28 29 32 29 29  --  28  GLUCOSE 155* 139* 128* 81 167*  --  66*  BUN 9 10 10 12 12   --  9  CREATININE 0.50 0.51 0.51 0.49* 0.57  --  0.49*  CALCIUM 8.5 8.2* 8.6 8.5 8.1*  --  8.6  MG  --  1.5  --   --   --  1.5  --   PHOS 4.1 4.3 4.6 4.3 4.2  --   --    Liver Function Tests:  Recent Labs Lab 07/15/13 0525 07/16/13 0500 07/17/13 0500 07/18/13 0515 07/19/13 0515  ALBUMIN 2.3* 2.0* 2.2* 2.3* 2.0*   No results found for this basename: LIPASE, AMYLASE,  in the last 168 hours No results found for this basename: AMMONIA,  in the last 168 hours CBC:  Recent Labs Lab 07/16/13 0500 07/19/13 0515 07/20/13 0615  WBC 6.0 7.5 5.3  HGB 9.4* 8.5* 8.3*  HCT 27.4* 24.4* 23.8*  MCV 93.2 92.1 91.2  PLT 116* 126* 127*   Cardiac Enzymes: No results found for this basename: CKTOTAL, CKMB, CKMBINDEX, TROPONINI,  in the last 168 hours BNP (last 3 results) No results found for this basename: PROBNP,  in the last 8760 hours CBG:  Recent Labs Lab 07/20/13 1843 07/20/13 2003 07/20/13 2030 07/21/13 0016 07/21/13 0418  GLUCAP 136* 176* 199* 124* 86    Recent Results (from the past 240 hour(s))  CLOSTRIDIUM DIFFICILE BY PCR     Status: None   Collection Time    07/19/13  4:22 PM      Result Value Range Status   C difficile by pcr NEGATIVE  NEGATIVE Final      Studies: No results found.  Scheduled Meds: . acidophilus  2 capsule Oral Daily  . alteplase  2 mg Intracatheter Once  . amLODipine  10 mg Per NG tube Daily  . antiseptic oral rinse  15 mL Mouth Rinse q12n4p  . carvedilol  12.5 mg Per NG tube BID WC  . chlorhexidine  15 mL Mouth Rinse BID  . cyanocobalamin  1,000 mcg Intramuscular Q30 days  . dipyridamole-aspirin  1 capsule Oral BID  . feeding supplement (ENSURE COMPLETE)  237 mL Oral BID BM  . insulin aspart  0-20 Units Subcutaneous Q4H  . insulin detemir  40 Units Subcutaneous QHS  . latanoprost  1 drop Both Eyes QHS  .  levETIRAcetam  1,000 mg Intravenous Once  . levETIRAcetam  1,000 mg Intravenous Q12H  . levothyroxine  75 mcg Per NG tube QAC breakfast  . liothyronine  25 mcg Per NG tube BID  . loperamide  2 mg Oral BID  . saccharomyces boulardii  250 mg Oral BID  . sodium chloride  10-40 mL Intracatheter Q12H  . sodium chloride  3 mL Intravenous Q12H  . valproate sodium  500 mg Intravenous Q12H  . vancomycin  125 mg Per Tube Q6H   Continuous Infusions:    Principal Problem:   Acute encephalopathy Active Problems:   HYPOTHYROIDISM   DIABETES MELLITUS, TYPE II   HYPERLIPIDEMIA   UTI (urinary tract infection)   HTN (hypertension)   Anxiety and depression   CVA (cerebral infarction)   Weakness generalized   Dehydration   Acute renal failure   Failure to thrive   Diarrhea   Hyponatremia   Metabolic acidosis   Clostridium difficile colitis   Hypomagnesemia   Hypernatremia   Seizures   Meningitis, unspecified(322.9)   Protein-calorie malnutrition, severe   Thrombocytopenia, unspecified    Time spent: >35 minutes     Esperanza Sheets  [facility] Pager [phone]. If 7PM-7AM, please contact night-coverage at [email or web address], password [facility] 07/21/2013, 8:39 AM  LOS: 20 days

## 2013-07-21 NOTE — Progress Notes (Signed)
CSW received call from nursing that per MD- patient will be stable for d/c tomorrow for SNF.  Spoke to [place] at [facility]- they do not have an vacancy this weekend; family's next choice was [facility].  Per Reyne Dumas- Admissions for [facility]- ok for d/c tomorrow- bed will be available. CSW spoke to patient's daugther Laqueta Due- [phone]. She was notified of above and is accepting of bed at [facility].  CSW will contact her in the morning once d/c is completed.  Lorri Frederick. [place]  [phone]

## 2013-07-21 NOTE — Progress Notes (Signed)
Family remains hopeful for a SNF bed at [facility] but no bed available as of today- 2nd choice is [facility]- at this time it does not appear MD is planning d.c today- CSW will continue to follow and work on SNF options with family-  Reece Levy, MSW [phone]

## 2013-07-21 NOTE — Progress Notes (Addendum)
NUTRITION FOLLOW UP  Intervention:    Continue Ensure Complete twice daily (350 kcals, 13 gm protein per 8 fl oz bottle)  Recommend discussing nutrition goals of care with family -- ? tube feeding (ie short term, long term) RD to follow for nutrition care plan  Nutrition Dx:   Inadequate oral intake related to decreased appetite & lethargy, ongoing  Goal:   Pt to meet >/= 90% of their estimated nutrition needs, unmet   Monitor:   PO & supplemental intake, weight, labs, I/O's  Assessment:   PMHx significant for DM2, retinopathy, legal blindness, HTN, CVA, hypothyroidism, anxiety/depression. Noted pt with 2 recent admissions to [facility] within the past month. Pt was d/c'd to rehab for about 3 weeks after hospitalization. Pt developed diarrhea, poor appetite, and overall decline x 9 days at [facility]. Family thought decline was 2/2 lack of care at SNF, pt was then sent home. Remains weak and bed bound. Reported weight loss of 25 lb x 1 month.  RD changed patient's EN regimen to nocturnal 10/6, however, patient's feeding tube taken out 10/7 AM and not replaced.  Speech Path Dysphagia Treatment notes reviewed -- patient remains on a Dys 1--thin liquid diet.  PO intake poor at 0-50% per flowsheet records.  Ensure Complete nutrition supplements in place -- drinking some.  Height: Ht Readings from Last 1 Encounters:  07/11/13 5\' 2"  (1.575 m)    Weight Status:   Wt Readings from Last 1 Encounters:  07/18/13 233 lb 11 oz (106 kg)    Re-estimated needs:  Kcal: 1550-1750 Protein: 80-95 gm Fluid: 1.5-1.7 L  Skin: Intact  Diet Order: Dysphagia 1, thin liquids   Intake/Output Summary (Last 24 hours) at 07/21/13 1111 Last data filed at 07/20/13 2200  Gross per 24 hour  Intake      0 ml  Output      2 ml  Net     -2 ml    Labs:   Recent Labs Lab 07/16/13 0500 07/17/13 0500 07/18/13 0515 07/19/13 0515 07/19/13 1251 07/20/13 0615  NA 141 137 142 139  --  139  K 4.3 4.2 3.9 4.2  --   3.6  CL 106 100 103 103  --  104  CO2 29 32 29 29  --  28  BUN 10 10 12 12   --  9  CREATININE 0.51 0.51 0.49* 0.57  --  0.49*  CALCIUM 8.2* 8.6 8.5 8.1*  --  8.6  MG 1.5  --   --   --  1.5  --   PHOS 4.3 4.6 4.3 4.2  --   --   GLUCOSE 139* 128* 81 167*  --  66*    CBG (last 3)   Recent Labs  07/20/13 2030 07/21/13 0016 07/21/13 0418  GLUCAP 199* 124* 86    Scheduled Meds: . acidophilus  2 capsule Oral Daily  . alteplase  2 mg Intracatheter Once  . amLODipine  10 mg Per NG tube Daily  . antiseptic oral rinse  15 mL Mouth Rinse q12n4p  . carvedilol  12.5 mg Per NG tube BID WC  . chlorhexidine  15 mL Mouth Rinse BID  . cyanocobalamin  1,000 mcg Intramuscular Q30 days  . dipyridamole-aspirin  1 capsule Oral BID  . feeding supplement (ENSURE COMPLETE)  237 mL Oral BID BM  . insulin aspart  0-20 Units Subcutaneous Q4H  . insulin detemir  40 Units Subcutaneous QHS  . latanoprost  1 drop Both Eyes QHS  .  levETIRAcetam  1,000 mg Intravenous Once  . levothyroxine  75 mcg Per NG tube QAC breakfast  . liothyronine  25 mcg Per NG tube BID  . loperamide  2 mg Oral BID  . saccharomyces boulardii  250 mg Oral BID  . sodium chloride  10-40 mL Intracatheter Q12H  . sodium chloride  3 mL Intravenous Q12H  . vancomycin  125 mg Per Tube Q6H    Continuous Infusions:   Maureen Chatters, RD, LDN Pager #: [phone] After-Hours Pager #: [phone]

## 2013-07-21 NOTE — Progress Notes (Signed)
MRI called with results of the follow up MRI. Dr. York Spaniel paged. Will continue to monitor patient

## 2013-07-21 NOTE — Progress Notes (Signed)
Physical Therapy Treatment Patient Details Name: EMILYANN BANKA MRN: [id number] DOB: [birth year] Today's Date: 07/21/2013 Time: 4098-1191 PT Time Calculation (min): 31 min  PT Assessment / Plan / Recommendation  History of Present Illness 72 y.o. female has had a complicated medical course over the last 1 month. Patient is unable to provide history due to weakness and? Dementia. History is provided by patient's daughter [name] who is at the bedside. 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 She was recently admitted twice at [facility]-06/04/13-06/06/13 (DKA) & 06/07/13-06/09/13 (metabolic encephalopathy, uncontrolled DM and CVA). Following the last discharge, patient was sent to rehabilitation where she was for approximately 3 weeks. Patient has had a sustained rapid decline at SNF. 2 days after going to SNF, she developed profuse diarrhea of 9 days duration, extremely poor appetite, lethargy, laying in bed and unwilling to participate in therapies, decreased urine output and progressive generalized weakness.    PT Comments   Patient initially very difficult to arouse, but once aroused was able to vocalize couple of times and drink from a cup.  She assisted herself for balance at edge of the bed and demonstrated increased participation in all mobility today as compared to last session.  Follow Up Recommendations  SNF;Supervision/Assistance - 24 hour     Does the patient have the potential to tolerate intense rehabilitation   N/A  Barriers to Discharge  None      Equipment Recommendations    None   Recommendations for Other Services   None  Frequency Min 2X/week   Progress towards PT Goals Progress towards PT goals: Progressing toward goals  Plan Frequency needs to be updated    Precautions / Restrictions Precautions Precautions: Fall Precaution Comments: C-Diff precautions    Pertinent Vitals/Pain No current complaints, CBG 68 (tech checked during session, pt noted to be diaphoretic)    Mobility  Bed Mobility Supine to Sit: 1: +2 Total assist Supine to Sit: Patient Percentage: 20% Sitting - Scoot to Edge of Bed: 3: Mod assist Transfers Stand Pivot Transfers: 1: +2 Total assist Stand Pivot Transfers: Patient Percentage: 30% Details for Transfer Assistance: able to accept weight on feet and pivot with assist     Exercises General Exercises - Upper Extremity Shoulder Flexion: PROM;Both;5 reps;Supine Shoulder Horizontal ABduction: PROM;Both;5 reps;Supine Shoulder Horizontal ADduction: PROM;Both;5 reps General Exercises - Lower Extremity Heel Slides: PROM;Both;5 reps;Supine   PT Goals (current goals can now be found in the care plan section)    Visit Information  Last PT Received On: 07/21/13 Assistance Needed: +2 History of Present Illness: 72 y.o. female has had a complicated medical course over the last 1 month. Patient is unable to provide history due to weakness and? Dementia. History is provided by patient's daughter [name] who is at the bedside. 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 She was recently admitted twice at [facility]-06/04/13-06/06/13 (DKA) & 06/07/13-06/09/13 (metabolic encephalopathy, uncontrolled DM and CVA). Following the last discharge, patient was sent to rehabilitation where she was for approximately 3 weeks. Patient has had a sustained rapid decline at SNF. 2 days after going to SNF, she developed profuse diarrhea of 9 days duration, extremely poor appetite, lethargy, laying in bed and unwilling to participate in therapies, decreased urine output and progressive generalized weakness.     Subjective Data   That's enough cover, thanks  Cognition  Cognition Arousal/Alertness: Lethargic Behavior During Therapy: Flat  affect Overall Cognitive Status: Impaired/Different from baseline Area of Impairment: Following commands;Awareness;Attention Following Commands: Follows one step commands inconsistently;Follows one step commands with increased time Problem Solving: Slow processing;Requires verbal cues;Requires tactile cues General Comments: needs verbal and tactile cues and manual guidance     Balance  Static Sitting Balance Static Sitting - Balance Support: Right upper extremity supported;Feet supported Static Sitting - Level of Assistance: 5: Stand by assistance  End of Session PT - End of Session Equipment Utilized During Treatment: Gait belt;Oxygen Activity Tolerance: Patient limited by fatigue Patient left: in chair;with call bell/phone within reach   GP     [facility] 07/21/2013, 1:37 PM [place], [place] [phone] 07/21/2013

## 2013-07-21 NOTE — Progress Notes (Signed)
Hypoglycemic Event  CBG: 68  Treatment: D50 IV 25 mL  Symptoms: Sweaty  Follow-up CBG: Time:1226 CBG Result:131  Possible Reasons for Event: Inadequate meal intake  Comments/MD notified: pt arousable. Poor po intake. Pt started to eat after initial CBG.    Carlyon Prows  Remember to initiate Hypoglycemia Order Set & complete

## 2013-07-21 NOTE — Procedures (Signed)
EEG report.  Brief clinical history: 72 years old with altered mental status. No prior history of frank epileptic seizures.  Technique: this is a 17 channel routine scalp EEG performed at the bedside with bipolar and monopolar montages arranged in accordance to the international 10/20 system of electrode placement. One channel was dedicated to EKG recording.  No drowsiness or stage 2 sleep achieved. No activating procedures performed.  Description:In the wakeful state, the best background consisted of a medium amplitude, posterior dominant,poorly sustained, symmetric and reactive 8-9 Hz rhythm. No focal or generalized epileptiform discharges noted.  No slowing seen.  EKG showed sinus rhythm.  Impression: this is a normal awake EEG. Please, be aware that a normal EEG does not exclude the possibility of epilepsy.  Clinical correlation is advised.  Wyatt Portela, MD

## 2013-07-21 NOTE — Progress Notes (Signed)
SLP Cancellation Note  Patient Details Name: Regina [name] MRN: [id number] DOB: [birth year]   Cancelled treatment:       Reason Eval/Treat Not Completed: Patient at procedure or test/unavailable. Pt at MRI. Will f/u next week.   Harlon Ditty, MA CCC-SLP [phone]  Claudine Mouton 07/21/2013, 2:09 PM

## 2013-07-22 LAB — LIPID PANEL
Cholesterol: 190 mg/dL (ref 0–200)
HDL: 28 mg/dL — ABNORMAL LOW (ref >39)
LDL Cholesterol: 130 mg/dL — ABNORMAL HIGH (ref 0–99)
Total CHOL/HDL Ratio: 6.8 RATIO
Triglycerides: 160 mg/dL — ABNORMAL HIGH (ref <150)

## 2013-07-22 LAB — GLUCOSE, CAPILLARY: Glucose-Capillary: 131 mg/dL — ABNORMAL HIGH (ref 70–99)

## 2013-07-22 MED ORDER — ALBUTEROL SULFATE (5 MG/ML) 0.5% IN NEBU: 2.5000 mg | INHALATION_SOLUTION | RESPIRATORY_TRACT | Status: DC | PRN

## 2013-07-22 MED ORDER — INSULIN DETEMIR 100 UNIT/ML ~~LOC~~ SOLN: 40.0000 [IU] | Freq: Every day | SUBCUTANEOUS | Status: DC

## 2013-07-22 MED ORDER — VANCOMYCIN 50 MG/ML ORAL SOLUTION: 125.0000 mg | Freq: Four times a day (QID) | ORAL | Status: AC

## 2013-07-22 MED ORDER — AMLODIPINE BESYLATE 10 MG PO TABS: 5.0000 mg | ORAL_TABLET | Freq: Every day | ORAL | Status: DC

## 2013-07-22 MED ORDER — RISAQUAD PO CAPS: 2.0000 | ORAL_CAPSULE | Freq: Every day | ORAL | Status: DC

## 2013-07-22 NOTE — Discharge Summary (Signed)
Physician Discharge Summary  Regina [name] RUE:[id number] DOB: 16-Aug-1941 DOA: 07/01/2013  PCP: Michiel Sites, MD  Admit date: 07/01/2013 Discharge date: 07/22/2013  Time spent: > 35 minutes  Recommendations for Outpatient Follow-up:  F/u with neurologist in 3-4 weeks F/u with PCP in 1-2 weeks tsh in 4 weeks Discharge Diagnoses:  Principal Problem:   Acute encephalopathy Active Problems:   HYPOTHYROIDISM   DIABETES MELLITUS, TYPE II   HYPERLIPIDEMIA   UTI (urinary tract infection)   HTN (hypertension)   Anxiety and depression   CVA (cerebral infarction)   Weakness generalized   Dehydration   Acute renal failure   Failure to thrive   Diarrhea   Hyponatremia   Metabolic acidosis   Clostridium difficile colitis   Hypomagnesemia   Hypernatremia   Seizures   Meningitis, unspecified(322.9)   Protein-calorie malnutrition, severe   Thrombocytopenia, unspecified   Discharge Condition: stable   Diet recommendation: heart healthy   Filed Weights   07/15/13 0746 07/16/13 0500 07/18/13 0500  Weight: 105.9 kg (233 lb 7.5 oz) 105 kg (231 lb 7.7 oz) 106 kg (233 lb 11 oz)    History of present illness:  "72 y.o. female has had a complicated medical course over the last 1 month. Patient is unable to provide history due to weakness and? Dementia. History is provided by patient's daughter [name] who is at the bedside. 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 She was recently admitted twice at [facility]-06/04/13-06/06/13 (DKA) & 06/07/13-06/09/13 (metabolic encephalopathy, uncontrolled DM and CVA). Following the last discharge, patient was sent to rehabilitation where she was for approximately 3 weeks. Patient has had a sustained rapid decline at SNF. 2 days after going to SNF, she developed profuse diarrhea of 9 days duration, extremely poor appetite, lethargy, laying in bed  and unwilling to participate in therapies, decreased urine output and progressive generalized weakness"  -admitted with encephalopathy, UTI, c diff   Hospital Course:  1. Acute on chronic encephalopathy; progressive worse likely related to CVA(on aggrenox) on top of old CVA, and +/- meningitis/sepsis(resolved); patient was encephalopathic since 06/04/13 per H&P;  -MRI (10/2): new acute infarct of a left parietal gyrus at the vertex; old infarcts at pons, R thalamus, basal ganglia and chronic small vessel changes throughout the deep white matter  -repeat MRI (10/10): New tiny mid left frontal lobe infarct  -EEG (10/4): EEG is consistent with a Mild non-specific generalized cerebral dysfunction(encephalopathy) repeat EEG: normal; repeat EEG; no seizure  -antiepileptics --> somnolence; d/w neurology hold meds (keppra, depacon) then patient clinically improved, following commands, answering questions; encephalopathy resolving   2. Probable seizure ; per neurology : was on keppra 1000 mg IV BID and Depacon 500 mg IV BID.  -repeat EEG normal; d/c antiepileptics   3. Possible meningitis/CSF infection was on brief antibiotics, antiviral; which was later d/c per ID; LP unremarkable;  4. AKI resolved; likely prerenal/diarrhea +metformin, +ACE -d/c metformin, hold ACE 5. C diff colitis; on PO vanc; improved; to complete another week upon d/c  6. Escherichia coli UTI Patient received IV Rocephin x3 days.  7. HTN Resume home dose coreg and norvasc daily. Stable; hold ACE 8. Acute CVA; and history of CVA; MRI as above; resume aggrenox; obtain echo; pend   9. DM Hemoglobin A1c is 9.6. Due to e[pisodes of hypoglycemia Levemir decreaqsed to 40 QHS, cont ISS with accuchecks. Recheck HA1c in 4 weeks  10. Hypothyroidism; is  Synthroid and Cytomel. But recent TSH 0.03; free T4-0.6; repeat TFTs; cont levothyroxine, hold cytomel; repeat tsh in 4 weeks  11. Nuitrition  -Was on panda tube 9/29. On TF. Off since 10/6;    Goals of care d/w her daughter patient never wanted alternative feeding or aggressive care; cont encourage Po intake with assistance  -patient is DNR, will need palliative acre follow up at SNF; possible hospice care in near future      Procedures:  MRI (i.e. Studies not automatically included, echos, thoracentesis, etc; not x-rays)  Consultations:  Neurology   Discharge Exam: Filed Vitals:   07/22/13 0537  BP: 154/65  Pulse: 84  Temp: 98.6 F (37 C)  Resp: 18    General: alert  Cardiovascular: s1,s2 rrr Respiratory: cta bl   Discharge Instructions  Discharge Orders   Future Appointments Provider Department Dept Phone   10/26/2013 2:00 PM Dianne Dun, MD [facility] [phone]   Future Orders Complete By Expires   Diet - low sodium heart healthy  As directed    Discharge instructions  As directed    Comments:     Follow up with primary care doctor in 1 week  Follow up with neurologist in 3-4 weeks   Increase activity slowly  As directed        Medication List    STOP taking these medications       amLODipine-valsartan 10-320 MG per tablet  Commonly known as:  EXFORGE     aspirin EC 81 MG tablet     atenolol 100 MG tablet  Commonly known as:  TENORMIN     liothyronine 25 MCG tablet  Commonly known as:  CYTOMEL     metFORMIN 500 MG tablet  Commonly known as:  GLUCOPHAGE      TAKE these medications       acidophilus Caps capsule  Take 2 capsules by mouth daily.     albuterol (5 MG/ML) 0.5% nebulizer solution  Commonly known as:  PROVENTIL  Take 0.5 mLs (2.5 mg total) by nebulization every 2 (two) hours as needed for wheezing.     amLODipine 10 MG tablet  Commonly known as:  NORVASC  Take 0.5 tablets (5 mg total) by mouth daily.     CALCIUM 1200 PO  Take 1 tablet by mouth daily.     carvedilol 12.5 MG tablet  Commonly known as:  COREG  Take 1 tablet (12.5 mg total) by mouth 2 (two) times daily with a meal.      dipyridamole-aspirin 200-25 MG per 12 hr capsule  Commonly known as:  AGGRENOX  Take 1 capsule by mouth 2 (two) times daily.     ezetimibe 10 MG tablet  Commonly known as:  ZETIA  Take 10 mg by mouth daily.     ferrous fumarate 325 (106 FE) MG Tabs tablet  Commonly known as:  HEMOCYTE - 106 mg FE  Take 1 tablet by mouth daily.     Fish Oil 1000 MG Caps  Take 2,000 mg by mouth daily.     insulin aspart 100 UNIT/ML injection  Commonly known as:  novoLOG  - 0-9 Units, Subcutaneous, 3 times daily with meals  - CBG < 70: implement hypoglycemia protocol  - CBG 70 - 120: 0 units  - CBG 121 - 150: 1 unit  - CBG 151 - 200: 2 units  - CBG 201 - 250: 3 units  - CBG 251 - 300: 5 units  -  CBG 301 - 350: 7 units  - CBG 351 - 400: 9 units  - CBG > 400: call MD     insulin detemir 100 UNIT/ML injection  Commonly known as:  LEVEMIR  Inject 0.4 mLs (40 Units total) into the skin at bedtime.     levothyroxine 75 MCG tablet  Commonly known as:  SYNTHROID, LEVOTHROID  Take 1 tablet (75 mcg total) by mouth daily before breakfast.     multivitamin with minerals Tabs tablet  Take 1 tablet by mouth daily.     ranitidine 75 MG tablet  Commonly known as:  ZANTAC  Take 75 mg by mouth 2 (two) times daily.     simvastatin 20 MG tablet  Commonly known as:  ZOCOR  Take 1 tablet (20 mg total) by mouth every evening.     Travoprost (BAK Free) 0.004 % Soln ophthalmic solution  Commonly known as:  TRAVATAN  Place 1 drop into both eyes at bedtime.     vancomycin 50 mg/mL oral solution  Commonly known as:  VANCOCIN  Place 2.5 mLs (125 mg total) into feeding tube every 6 (six) hours.       Allergies  Allergen Reactions  . Amoxicillin     REACTION: rash  . Ciprofloxacin     REACTION: hives  . Codeine     REACTION: nervous, shaky  . Duloxetine     REACTION: reaction not known  . Hydrochlorothiazide W-Triamterene     REACTION: hyponatremia  . Ticlopidine Hcl     REACTION:  bleeding       Follow-up Information   Follow up with Michiel Sites, MD In 1 week.   Specialty:  Endocrinology   Contact information:   [address] [place] [address] [phone]       Follow up with Wyatt Portela, MD. Schedule an appointment as soon as possible for a visit in 4 weeks.   Specialty:  Neurology   Contact information:   [address] [place] [address] [phone]        The results of significant diagnostics from this hospitalization (including imaging, microbiology, ancillary and laboratory) are listed below for reference.    Significant Diagnostic Studies: Ct Angio Head W/cm &/or Wo Cm  07/15/2013   CLINICAL DATA:  Acute encephalopathy in the setting of high fever. Evaluate for cause of punctate acute infarction left parietal gyrus discovered 07/13/2013. Stroke risk factors include diabetes mellitus, and hypertension.  EXAM: CT ANGIOGRAPHY HEAD AND NECK  TECHNIQUE: Multidetector CT imaging of the head and neck was performed using the standard protocol during bolus administration of intravenous contrast. Multiplanar CT image reconstructions including MIPs were obtained to evaluate the vascular anatomy. Carotid stenosis measurements (when applicable) are obtained utilizing NASCET criteria, using the distal internal carotid diameter as the denominator.  CONTRAST:  50mL OMNIPAQUE IOHEXOL 350 MG/ML SOLN  COMPARISON:  MR brain 07/13/2013.  MRA head 06/09/2013.  FINDINGS: CTA HEAD FINDINGS  No visible acute large vessel infarct. Punctate infarct observed on MRI are not visible. No acute hemorrhage, mass lesion, hydrocephalus, or extra-axial fluid. Global atrophy with chronic microvascular ischemic change. No abnormal postcontrast enhancement. Internal carotid arteries widely patent. Basilar artery widely patent with right vertebral dominant. Critical stenosis distal left vertebral, greater than 90% does not involve the PICA, and is of  doubtful significance given the dominant right vertebral  Small A1 ACA on the left. Robust right A1 ACA is dominant. Bilateral 50-75% pericallosal ACA stenoses.  50% stenosis M1 segment right middle cerebral artery. No left M1 MCA stenosis. Mild irregularity distal MCA and PCA branches consistent with intracranial atherosclerotic change but no distal branch occlusion.  90% stenosis origin P1 left PCA (image 18 series [id number]), also moderate irregularity left greater than right P2 PCA segments.  No cerebellar branch occlusion.  Review of the MIP images confirms the above findings.  CTA NECK FINDINGS  Transverse arch atheromatous change without ulceration. Dolichoectasia great vessels without proximal stenosis. No measurable carotid stenosis with mild non stenotic predominantly calcific plaque bilaterally. No evidence for ICA dissection or fibromuscular dysplasia. Both vertebral arteries are patent from their origins through the neck with right dominant. No ostial stenosis.  Mild cervical spondylosis. No neck masses. Mild vascular congestion in the chest. No adenopathy. Nasogastric tube. No sinus or mastoid disease.  Review of the MIP images confirms the above findings.  IMPRESSION: CTA HEAD IMPRESSION  90% stenosis P1 segment left PCA could correlate with the observed pattern of infarction.  Critical stenosis distal left vertebral of doubtful significance, as the right vertebral is dominant.  No flow-limiting stenosis of the internal carotid arteries, basilar artery, or proximal ACA or MCA branches.  CTA NECK IMPRESSION  No extracranial flow reducing lesion is identified. Mild non stenotic atheromatous change at the bifurcations. No evidence for vertebral artery compromise. No extracranial dissection.   Electronically Signed   By: Davonna Belling M.D.   On: 07/15/2013 16:37   Dg Abd 1 View  07/09/2013   *RADIOLOGY REPORT*  Clinical Data: Band placement  ABDOMEN - 1 VIEW  Comparison: Prior abdominal radiograph earlier  today at 11:49 a.m.  Findings: Frontal views of the abdomen demonstrate a weighted tip enteric feeding tube.  The tip of the tube overlies the right upper quadrant, likely within the first portion of the duodenum.  The bowel gas pattern is not obstructed.  No acute osseous abnormality. No large free air on this supine series.  IMPRESSION: The tip of the enteric feeding tube is likely in the first portion of the duodenum.   Original Report Authenticated By: Malachy Moan, M.D.   Dg Abd 1 View  07/09/2013   CLINICAL DATA:  Post feeding tube placement  EXAM: ABDOMEN - 1 VIEW  COMPARISON:  Portable exam 1149 hr compared to 07/05/2013  FINDINGS: Tip of feeding tube projects over distal stomach.  EKG leads project over chest and abdomen.  Normal bowel gas pattern.  Atelectasis versus infiltrate left lung base.  IMPRESSION: Tip of feeding tube projects over distal stomach.   Electronically Signed   By: Ulyses Southward M.D.   On: 07/09/2013 12:04   Dg Abd 1 View  07/05/2013   CLINICAL DATA:  Nausea and vomiting  EXAM: ABDOMEN - 1 VIEW  COMPARISON:  July 01, 2013  FINDINGS: Normal bowel gas pattern. Negative for bowel obstruction. Lumbar degenerative changes. No acute bony abnormality. Degenerative change in the left hip joint.  IMPRESSION: Nonobstructive bowel gas pattern.   Electronically Signed   By: Marlan Palau M.D.   On: 07/05/2013 09:58   Ct Head Wo Contrast  07/05/2013   CLINICAL DATA:  Altered mental status  EXAM: CT HEAD WITHOUT CONTRAST  TECHNIQUE: Contiguous axial images were obtained from the base of the skull through the vertex without intravenous contrast.  COMPARISON:  MRI 06/08/2013, CT 06/07/2013.  FINDINGS: Moderate atrophy. Negative for hydrocephalus. Chronic microvascular ischemic change throughout the white matter. Chronic lacunar infarct left globus pallidus. Chronic ischemic change in the  pons.  Negative for acute infarct. Negative for hemorrhage or mass. Calvarium is intact.   IMPRESSION: Atrophy and chronic microvascular ischemia. No acute abnormality.   Electronically Signed   By: Marlan Palau M.D.   On: 07/05/2013 15:11   Ct Angio Neck W/cm &/or Wo/cm  07/15/2013   CLINICAL DATA:  Acute encephalopathy in the setting of high fever. Evaluate for cause of punctate acute infarction left parietal gyrus discovered 07/13/2013. Stroke risk factors include diabetes mellitus, and hypertension.  EXAM: CT ANGIOGRAPHY HEAD AND NECK  TECHNIQUE: Multidetector CT imaging of the head and neck was performed using the standard protocol during bolus administration of intravenous contrast. Multiplanar CT image reconstructions including MIPs were obtained to evaluate the vascular anatomy. Carotid stenosis measurements (when applicable) are obtained utilizing NASCET criteria, using the distal internal carotid diameter as the denominator.  CONTRAST:  50mL OMNIPAQUE IOHEXOL 350 MG/ML SOLN  COMPARISON:  MR brain 07/13/2013.  MRA head 06/09/2013.  FINDINGS: CTA HEAD FINDINGS  No visible acute large vessel infarct. Punctate infarct observed on MRI are not visible. No acute hemorrhage, mass lesion, hydrocephalus, or extra-axial fluid. Global atrophy with chronic microvascular ischemic change. No abnormal postcontrast enhancement. Internal carotid arteries widely patent. Basilar artery widely patent with right vertebral dominant. Critical stenosis distal left vertebral, greater than 90% does not involve the PICA, and is of doubtful significance given the dominant right vertebral  Small A1 ACA on the left. Robust right A1 ACA is dominant. Bilateral 50-75% pericallosal ACA stenoses.  50% stenosis M1 segment right middle cerebral artery. No left M1 MCA stenosis. Mild irregularity distal MCA and PCA branches consistent with intracranial atherosclerotic change but no distal branch occlusion.  90% stenosis origin P1 left PCA (image 18 series [id number]), also moderate irregularity left greater than right P2 PCA segments.   No cerebellar branch occlusion.  Review of the MIP images confirms the above findings.  CTA NECK FINDINGS  Transverse arch atheromatous change without ulceration. Dolichoectasia great vessels without proximal stenosis. No measurable carotid stenosis with mild non stenotic predominantly calcific plaque bilaterally. No evidence for ICA dissection or fibromuscular dysplasia. Both vertebral arteries are patent from their origins through the neck with right dominant. No ostial stenosis.  Mild cervical spondylosis. No neck masses. Mild vascular congestion in the chest. No adenopathy. Nasogastric tube. No sinus or mastoid disease.  Review of the MIP images confirms the above findings.  IMPRESSION: CTA HEAD IMPRESSION  90% stenosis P1 segment left PCA could correlate with the observed pattern of infarction.  Critical stenosis distal left vertebral of doubtful significance, as the right vertebral is dominant.  No flow-limiting stenosis of the internal carotid arteries, basilar artery, or proximal ACA or MCA branches.  CTA NECK IMPRESSION  No extracranial flow reducing lesion is identified. Mild non stenotic atheromatous change at the bifurcations. No evidence for vertebral artery compromise. No extracranial dissection.   Electronically Signed   By: Davonna Belling M.D.   On: 07/15/2013 16:37   Mr Brain Wo Contrast  07/21/2013   CLINICAL DATA:  Metabolic encephalopathy, CVA and poorly controlled hypertension. Diabetic. Hypercholesterolemia.  EXAM: MRI HEAD WITHOUT CONTRAST  TECHNIQUE: Multiplanar, multisequence MR imaging was performed. No intravenous contrast was administered.  COMPARISON:  07/15/2013 CT angiogram. 07/13/2013 MR brain.  FINDINGS: Exam is motion degraded.  Tiny subacute infarct left motor strip once again noted (seen on 07/13/2013 exam).  New tiny mid left frontal lobe infarct.  Remote thalamic and basal ganglia as well as pontine infarcts.  Small  vessel disease type changes.  Global atrophy without  hydrocephalus.  No intracranial hemorrhage.  No intracranial mass lesion noted on this unenhanced exam.  Major intracranial vascular structures are patent. Atherosclerotic type changes left vertebral artery.  Complete opacification sphenoid sinus air cells are almost complete opacification right ethmoid sinus air cells and right frontal sinus.  Mastoid air cell partial opacification.  IMPRESSION: New tiny mid left frontal lobe infarct.  Tiny subacute infarct left motor strip once again noted (seen on 07/13/2013 exam).  Remote thalamic and basal ganglia as well as pontine infarcts bilaterally.  Small vessel disease type changes.  Global atrophy without hydrocephalus.  Complete opacification sphenoid sinus air cells are almost complete opacification right ethmoid sinus air cells and right frontal sinus.  Mastoid air cell partial opacification.  These results will be called to the ordering clinician or representative by the Radiologist Assistant, and communication documented in the PACS Dashboard.   Electronically Signed   By: Bridgett Larsson M.D.   On: 07/21/2013 14:50   Mr Brain Wo Contrast  07/14/2013   CLINICAL DATA:  Altered mental status.  Intermittent seizures.  EXAM: MRI HEAD WITHOUT CONTRAST  TECHNIQUE: Multiplanar, multisequence MR imaging was performed. No intravenous contrast was administered.  COMPARISON:  07/06/2013.  06/08/2013.  FINDINGS: Diffusion imaging shows a punctate acute infarction along the surface of a left parietal gyrus at the vertex. No other acute infarction. Chronic ischemic changes for seen within the pons. There are old small vessel cerebellar infarctions. The old infarction in the right thalamus is evident. Old lacunar infarctions in the basal ganglia and chronic small vessel changes throughout the deep white matter remain evident. No large vessel territory infarction. No mass lesion, hemorrhage, hydrocephalus or extra-axial collection. No pituitary mass. No inflammatory sinus disease.   IMPRESSION: Newly seen punctate acute infarction along the surface of a left parietal gyrus at the vertex.  Old ischemic changes elsewhere throughout the brain as outlined above.   Electronically Signed   By: Paulina Fusi M.D.   On: 07/14/2013 07:26   Mr Brain Wo Contrast  07/06/2013   CLINICAL DATA:  Altered mental status  EXAM: MRI HEAD WITHOUT CONTRAST  TECHNIQUE: Multiplanar, multisequence MR imaging was performed. No intravenous contrast was administered.  COMPARISON:  MRI 06/08/2013  FINDINGS: Image quality degraded by motion.  Negative for acute infarct. Diffusion-weighted imaging is diagnostic.  Generalized atrophy. Chronic microvascular ischemic changes in the white matter. Chronic infarcts in the basal ganglia and pons bilaterally. No hemorrhage or mass. No fluid collection.  Paranasal sinuses are clear.  IMPRESSION: Image quality degraded by motion. No acute abnormality.   Electronically Signed   By: Marlan Palau M.D.   On: 07/06/2013 13:34   Mr Lumbar Spine W Wo Contrast  07/13/2013   CLINICAL DATA:  Left-sided weakness.  Question injury or infection.  EXAM: MRI LUMBAR SPINE WITHOUT AND WITH CONTRAST  TECHNIQUE: Multiplanar and multiecho pulse sequences of the lumbar spine were obtained without and with intravenous contrast.  CONTRAST:  20mL MULTIHANCE GADOBENATE DIMEGLUMINE 529 MG/ML IV SOLN  COMPARISON:  None available.  FINDINGS: Normal signal is present in the conus medullaris which terminates at L1, within normal limits. Marrow signal is somewhat heterogeneous without a discrete lesion. Mild endplate changes are present at L3-4. No pathologic enhancement is present to suggest infection or mass. A remote superior endplate fracture is present at T12. No acute fractures are present. Limited imaging of the abdomen demonstrates thinning of the renal parenchyma on the  left. No discrete lesions are present.  The disc levels at L2-3 and above are normal.  L3-4: A mild broad-based disc bulge is  present. There is no significant stenosis.  L4-5: Negative.  L5-S1: Negative.  IMPRESSION: 1. Mild broad-based disk bulging and facet hypertrophy at L3-4 without significant stenosis. 2. No evidence for infection or acute injury. 3. No acute or focal abnormality to explain the patient's symptoms.   Electronically Signed   By: Gennette Pac   On: 07/13/2013 15:42   US Renal  07/01/2013   CLINICAL DATA:  Acute renal failure  EXAM: RENAL/URINARY TRACT ULTRASOUND COMPLETE  COMPARISON:  None.  FINDINGS: Right Kidney  Length: Measures 12.8 cm in length. Echogenicity within normal limits. No mass or hydronephrosis visualized. Cyst within the inferior pole measures 1.6 x 1.4 x 1.5 cm.  Left Kidney  Length: Measures 12.1 cm. Echogenicity within normal limits. No mass or hydronephrosis visualized. Cyst within the upper pole measures 1.4 x 1.6 x 1.4 cm.  Bladder:  Collapsed around a Foley catheter.  IMPRESSION: 1. No hydronephrosis 2. Renal cysts.   Electronically Signed   By: Signa Kell M.D.   On: 07/01/2013 15:59   Dg Chest Port 1 View  07/06/2013   CLINICAL DATA:  Fever  EXAM: PORTABLE CHEST - 1 VIEW  COMPARISON:  07/01/2013  FINDINGS: Cardiomediastinal silhouette is stable. Central mild vascular congestion without pulmonary edema. There is poor inspiration with mild basilar atelectasis. Bony thorax is stable. No segmental infiltrate or pleural effusion.  IMPRESSION: Poor inspiration with mild basilar atelectasis. No segmental infiltrate or pulmonary edema. Central mild vascular congestion.   Electronically Signed   By: Natasha Mead   On: 07/06/2013 08:14   Dg Chest Port 1 View  07/01/2013   CLINICAL DATA:  Shortness of breath; hypertension  EXAM: PORTABLE CHEST - 1 VIEW  COMPARISON:  June 07, 2013  FINDINGS: There is no edema or consolidation. Heart size and pulmonary vascularity are normal. No adenopathy. There is degenerative change in both shoulders.  IMPRESSION: No edema or consolidation.   Electronically  Signed   By: Bretta Bang   On: 07/01/2013 14:50   Dg Abd Portable 1v  07/01/2013   CLINICAL DATA:  Abdominal pain with nausea and diarrhea  EXAM: PORTABLE ABDOMEN - 1 VIEW  COMPARISON:  None.  FINDINGS: Bowel gas pattern is unremarkable. No obstruction or free air is seen on this supine examination. There is no abnormal calcification. There is arthropathy in the hip joints and lumbar spine.  Impression:  The bowel gas pattern is unremarkable.   Electronically Signed   By: Bretta Bang   On: 07/01/2013 14:48    Microbiology: Recent Results (from the past 240 hour(s))  CLOSTRIDIUM DIFFICILE BY PCR     Status: None   Collection Time    07/19/13  4:22 PM      Result Value Range Status   C difficile by pcr NEGATIVE  NEGATIVE Final     Labs: Basic Metabolic Panel:  Recent Labs Lab 07/16/13 0500 07/17/13 0500 07/18/13 0515 07/19/13 0515 07/19/13 1251 07/20/13 0615  NA 141 137 142 139  --  139  K 4.3 4.2 3.9 4.2  --  3.6  CL 106 100 103 103  --  104  CO2 29 32 29 29  --  28  GLUCOSE 139* 128* 81 167*  --  66*  BUN 10 10 12 12   --  9  CREATININE 0.51 0.51 0.49* 0.57  --  0.49*  CALCIUM 8.2* 8.6 8.5 8.1*  --  8.6  MG 1.5  --   --   --  1.5  --   PHOS 4.3 4.6 4.3 4.2  --   --    Liver Function Tests:  Recent Labs Lab 07/16/13 0500 07/17/13 0500 07/18/13 0515 07/19/13 0515  ALBUMIN 2.0* 2.2* 2.3* 2.0*   No results found for this basename: LIPASE, AMYLASE,  in the last 168 hours No results found for this basename: AMMONIA,  in the last 168 hours CBC:  Recent Labs Lab 07/16/13 0500 07/19/13 0515 07/20/13 0615 07/21/13 0900  WBC 6.0 7.5 5.3 5.8  HGB 9.4* 8.5* 8.3* 9.4*  HCT 27.4* 24.4* 23.8* 27.5*  MCV 93.2 92.1 91.2 92.0  PLT 116* 126* 127* 176   Cardiac Enzymes: No results found for this basename: CKTOTAL, CKMB, CKMBINDEX, TROPONINI,  in the last 168 hours BNP: BNP (last 3 results) No results found for this basename: PROBNP,  in the last 8760  hours CBG:  Recent Labs Lab 07/21/13 1141 07/21/13 1226 07/21/13 1629 07/21/13 2130 07/22/13 0705  GLUCAP 68* 131* 211* 173* 114*       Signed:  Iziah Cates N  [facility] 07/22/2013, 8:26 AM

## 2013-07-22 NOTE — Progress Notes (Signed)
RUA PICC D/C'ed per order.  Siite CDI without signs of infection.  Site cleaned with chlorhexadine , covered with vaseline guaze and dry 4x4.  Family at bedside able to state 3 signs of infection and drsg care.  No questions. No ADN

## 2013-07-22 NOTE — Progress Notes (Addendum)
Clinical Child psychotherapist (CSW) prepared D/C packet and arranged PTAR (non-emergency) transportation to [facility]. Daughter Shawna Orleans was at bedside and was agreeable to patient going to [facility]. Nursing is aware of above. Please reconsult if further social work needs arise. CSW signing off.  Jetta Lout, LCSWA Weekend CSW [phone]   CSW received call from daughter stating that [facility] did not have a bed. CSW called Raquel the admissions coordinator and she confirmed there was a bed and contacted the facility. CSW called daughter Shawna Orleans at [phone] and she reported that they had a bed and everything was taken care of.   Jetta Lout, LCSWA Weekend CSW [phone]

## 2013-07-22 NOTE — Progress Notes (Signed)
Pt to be d/c to [facility] health care nursing and rehab facility. Report called to Receiving facility and spoke with [facility] . pt daughter at bedside aware of the d/c instruction given to pt and daughter and daughter verbalized understanding. Pt ready and awaiting  PTAR transport for pick up  .RUA picc line removed by Iv Rn site intact  VS stable.Lt left at 1645 to SNF, condition stable.

## 2013-07-31 ENCOUNTER — Ambulatory Visit (INDEPENDENT_AMBULATORY_CARE_PROVIDER_SITE_OTHER): Payer: Medicare Other | Admitting: Internal Medicine

## 2013-07-31 ENCOUNTER — Encounter: Payer: Self-pay | Admitting: Internal Medicine

## 2013-07-31 VITALS — BP 134/78 | HR 81 | Temp 97.8°F | Resp 16

## 2013-07-31 DIAGNOSIS — E119 Type 2 diabetes mellitus without complications: Secondary | ICD-10-CM

## 2013-07-31 MED ORDER — INSULIN DETEMIR 100 UNIT/ML ~~LOC~~ SOLN: 30.0000 [IU] | Freq: Every day | SUBCUTANEOUS | Status: DC

## 2013-07-31 MED ORDER — INSULIN DETEMIR 100 UNIT/ML ~~LOC~~ SOLN: 30.0000 [IU] | Freq: Two times a day (BID) | SUBCUTANEOUS | Status: DC

## 2013-07-31 MED ORDER — INSULIN ASPART 100 UNIT/ML ~~LOC~~ SOLN: SUBCUTANEOUS | Status: DC

## 2013-07-31 NOTE — Progress Notes (Signed)
Patient ID: Regina [name], female   DOB: June 11, 1941, 72 y.o.   MRN: [id number]  HPI: Regina [name] is a 72 y.o.-year-old female, referred by her PCP, Dr. [facility], for management of DM2, insulin-dependent, uncontrolled, with complications (multiple TIAs, DR). Previous PCP: Dr Daiva Huge. She is in [facility] and [employer] now, unclear for how long. She was very independent before the TIAs. She will see Dr Dayton Martes in 10/2013. She is here with her daughter, who offers all of the history, as the patient does not speak.  Patient has been diagnosed with diabetes in 1994; she started insulin ~1999.   She had several DKA episodes, first one 3 years ago, last 05/2013. During last hospitalization in 07/01/2013, she had oliguric ARF. She also had a UTI then. The cause of her ARF was considered dehydration, ARB use, ATN >> Metformin and ARB held then. She had another hospitalization on 06/15/2013 for AMS and Hgly, and 2 hospitalizations in 05/2013 for DKa, and for fall + AMS, respectively.   Last hemoglobin A1c was: Lab Results  Component Value Date   HGBA1C 9.6* 06/07/2013   HGBA1C 9.4* 06/04/2013   HGBA1C 7.6 06/16/2006   Pt is on a regimen of: - Levemir 40 units 2x a day - Novolog 0-9 units tid ac, only based on SSI, target 120, 120-150: + 1 unit, then ISF 50.  Pt has her sugars checked 2-3 a day and they are: - am: 60-200, but mostly low - In the rest of the day, sugars can go up to 300s up to 500s Lowest sugar was 60; she has hypoglycemia awareness at 70. Highest sugar was 555.  Pt's meals are now pureed foods - pending swallowing eval. In the nursing home, despite having a normal one in the hospital, per daughter's report - she does not like the pureed food. Her meals are equal in size throughout the day.  - no CKD, with kidney function returning to normal after her episode of acute kidney injury during her last hospitalization. Last BUN/creatinine:  Lab Results  Component Value Date    BUN 9 07/20/2013   CREATININE 0.49* 07/20/2013  Off ARB since last admission 2/2 ARF.  - last set of lipids: Lab Results  Component Value Date   CHOL 190 07/22/2013   HDL 28* 07/22/2013   LDLCALC 130* 07/22/2013   TRIG 160* 07/22/2013   CHOLHDL 6.8 07/22/2013  She is on ezetimibe, Zocor. - last eye exam was in 02/2013. + DR in left eye. She could not go back as she was hospitalized. - no numbness and tingling in her feet. Podiatrist: Dr Stacie Acres.  Pt has FH of DM in sister and brother.   ROS: Constitutional: + weight loss, + fatigue, no subjective hyperthermia/hypothermia, + nocturia Eyes: no blurry vision, no xerophthalmia ENT: no sore throat, no nodules palpated in throat, no dysphagia/odynophagia, no hoarseness Cardiovascular: no CP/SOB/palpitations/leg swelling Respiratory: no cough/SOB Gastrointestinal: no N/V/+ D/no C/+heartburn Musculoskeletal: no muscle/joint aches Skin: no rashes Neurological: no tremors/numbness/tingling/dizziness Psychiatric: + depression/+ anxiety  Past Medical History  Diagnosis Date  . Hypertension   . Hypercholesteremia   . TIA (transient ischemic attack) 1990's    "mini strokes" (06/07/2013)  . Legally blind     "both eyes; some vision in the right' (06/07/2013)  . Heart murmur   . Hypothyroidism   . Type II diabetes mellitus   . Anemia   . GERD (gastroesophageal reflux disease)   . Arthritis     "left leg" (  06/07/2013)  . Depression   . Vulva cancer   . Altered mental status     "the first time I noticed any problem was 2 d ago" (06/07/2013)   Past Surgical History  Procedure Laterality Date  . Femur fracture surgery Left 1999  . Cesarean section  1968  . Tubal ligation  1970's  . Vulva surgery  1990's    "cut a big chunk out for cancer" (06/07/2013)   History   Social History  . Marital Status: Married    Spouse Name: N/A    Number of Children: 1   Occupational History  . N/A   Social History Main Topics  . Smoking status:  Former Smoker -- 4.00 packs/day for 30 years    Types: Cigarettes    Quit date: 10/12/1992  . Smokeless tobacco: Never Used     Comment: 06/07/2013 "hasn't smoked since her stroke in the 1990's"  . Alcohol Use: No  . Drug Use: No  . Sexual Activity: No   Other Topics Concern  . Not on file   Social History Narrative  . No narrative on file   Current Outpatient Prescriptions on File Prior to Visit  Medication Sig Dispense Refill  . acidophilus (RISAQUAD) CAPS capsule Take 2 capsules by mouth daily.      Marland Kitchen albuterol (PROVENTIL) (5 MG/ML) 0.5% nebulizer solution Take 0.5 mLs (2.5 mg total) by nebulization every 2 (two) hours as needed for wheezing.  20 mL  12  . amLODipine (NORVASC) 10 MG tablet Take 0.5 tablets (5 mg total) by mouth daily.      . Calcium Carbonate-Vit D-Min (CALCIUM 1200 PO) Take 1 tablet by mouth daily.      . carvedilol (COREG) 12.5 MG tablet Take 1 tablet (12.5 mg total) by mouth 2 (two) times daily with a meal.  60 tablet  0  . dipyridamole-aspirin (AGGRENOX) 200-25 MG per 12 hr capsule Take 1 capsule by mouth 2 (two) times daily.      Marland Kitchen ezetimibe (ZETIA) 10 MG tablet Take 10 mg by mouth daily.      . ferrous fumarate (HEMOCYTE - 106 MG FE) 325 (106 FE) MG TABS tablet Take 1 tablet by mouth daily.      Marland Kitchen levothyroxine (SYNTHROID, LEVOTHROID) 75 MCG tablet Take 1 tablet (75 mcg total) by mouth daily before breakfast.  30 tablet  0  . Multiple Vitamin (MULTIVITAMIN WITH MINERALS) TABS tablet Take 1 tablet by mouth daily.      . Omega-3 Fatty Acids (FISH OIL) 1000 MG CAPS Take 2,000 mg by mouth daily.      . ranitidine (ZANTAC) 75 MG tablet Take 75 mg by mouth 2 (two) times daily.      . simvastatin (ZOCOR) 20 MG tablet Take 1 tablet (20 mg total) by mouth every evening.  30 tablet  0  . Travoprost, BAK Free, (TRAVATAN) 0.004 % SOLN ophthalmic solution Place 1 drop into both eyes at bedtime.       No current facility-administered medications on file prior to visit.    Allergies  Allergen Reactions  . Amoxicillin     REACTION: rash  . Ciprofloxacin     REACTION: hives  . Codeine     REACTION: nervous, shaky  . Duloxetine     REACTION: reaction not known  . Hydrochlorothiazide W-Triamterene     REACTION: hyponatremia  . Ticlopidine Hcl     REACTION: bleeding   Family History  Problem Relation Age of Onset  .  Hypertension Mother   . Hyperlipidemia Mother   . Hyperlipidemia Father   . Hypertension Father   . Diabetes Father    PE: BP 134/78  Pulse 81  Temp(Src) 97.8 F (36.6 C) (Oral)  Resp 16  SpO2 96% Wt Readings from Last 3 Encounters:  07/18/13 233 lb 11 oz (106 kg)  06/08/13 229 lb 1.6 oz (103.919 kg)  06/05/13 233 lb 4 oz (105.8 kg)   Constitutional: overweight, in NAD, in wheelchair, can stand but only walking few steps, nonverbal, but can answer yes/no when asked Eyes: PERRLA, EOMI, no exophthalmos ENT: moist mucous membranes, no thyromegaly, no cervical lymphadenopathy Cardiovascular: RRR, 2/6 SEM, noRG, + bilateral LE edema - pitting Respiratory: CTA B Gastrointestinal: abdomen soft, NT, ND, BS+ Musculoskeletal: no deformities, strength intact in all 4 Skin: moist, warm, no rashes Neurological: no tremor with outstretched hands, DTR normal in all 4  ASSESSMENT: 1. DM2, insulin-dependent, uncontrolled, with complications - multiple TIAs 05/2013 - Diabetic retinopathy left eye >> legally blind - cardiologist: Dr Precious Reel   PLAN:  1. Patient with long-standing, recently more uncontrolled diabetes, on basal bolus insulin regimen, with a large amount of basal insulin and very few units of bolus NovoLog in a day. Subsequently, her sugars are lower in the morning and greatly increased throughout the day, with a staircase effect. - We discussed about options for treatment, and I suggested to:  Patient Instructions  Decrease Levemir to 30 units twice a day. Increase NovoLog to 5 units 3x a day before a meal. Add the  following Sliding scale: - 150-200: + 1 unit  - 201-250: + 2 units  - 251-300: + 3 units  - 301-350: + 4 units  - >351: + 5 units She should also have sugars checked 4x a day: before meals and at bedtime - given sugar log and advised daughter how to fill it (or give it to the nursing home staff) and to bring it at next appt  - given foot care handout and explained the principles  - given instructions for hypoglycemia management "15-15 rule"  - advised for yearly eye exams - she is up to date - Return to clinic in 1 mo with sugar log

## 2013-07-31 NOTE — Patient Instructions (Signed)
Decrease Levemir to 30 units twice a day. Increase NovoLog to 5 units 3x a day before a meal. Add the following Sliding scale: - 150-200: + 1 unit  - 201-250: + 2 units  - 251-300: + 3 units  - 301-350: + 4 units  - >351: + 5 units  PATIENT INSTRUCTIONS FOR TYPE 2 DIABETES:  DIET AND EXERCISE Diet and exercise is an important part of diabetic treatment.  We recommended aerobic exercise in the form of brisk walking (working between 40-60% of maximal aerobic capacity, similar to brisk walking) for 150 minutes per week (such as 30 minutes five days per week) along with 3 times per week performing 'resistance' training (using various gauge rubber tubes with handles) 5-10 exercises involving the major muscle groups (upper body, lower body and core) performing 10-15 repetitions (or near fatigue) each exercise. Start at half the above goal but build slowly to reach the above goals. If limited by weight, joint pain, or disability, we recommend daily walking in a swimming pool with water up to waist to reduce pressure from joints while allow for adequate exercise.    BLOOD GLUCOSES Monitoring your blood glucoses is important for continued management of your diabetes. Please check your blood glucoses 2-4 times a day: fasting, before meals and at bedtime (you can rotate these measurements - e.g. one day check before the 3 meals, the next day check before 2 of the meals and before bedtime, etc.   HYPOGLYCEMIA (low blood sugar) Hypoglycemia is usually a reaction to not eating, exercising, or taking too much insulin/ other diabetes drugs.  Symptoms include tremors, sweating, hunger, confusion, headache, etc. Treat IMMEDIATELY with 15 grams of Carbs:   4 glucose tablets    cup regular juice/soda   2 tablespoons raisins   4 teaspoons sugar   1 tablespoon honey Recheck blood glucose in 15 mins and repeat above if still symptomatic/blood glucose <100. Please contact our office at [phone] if you have  questions about how to next handle your insulin.  RECOMMENDATIONS TO REDUCE YOUR RISK OF DIABETIC COMPLICATIONS: * Take your prescribed MEDICATION(S). * Follow a DIABETIC diet: Complex carbs, fiber rich foods, heart healthy fish twice weekly, (monounsaturated and polyunsaturated) fats * AVOID saturated/trans fats, high fat foods, >2,300 mg salt per day. * EXERCISE at least 5 times a week for 30 minutes or preferably daily.  * DO NOT SMOKE OR DRINK more than 1 drink a day. * Check your FEET every day. Do not wear tightfitting shoes. Contact us if you develop an ulcer * See your EYE doctor once a year or more if needed * Get a FLU shot once a year * Get a PNEUMONIA vaccine once before and once after age 78 years  GOALS:  * Your Hemoglobin A1c of <7%  * fasting sugars need to be <130 * after meals sugars need to be <180 (2h after you start eating) * Your Systolic BP should be 140 or lower  * Your Diastolic BP should be 80 or lower  * Your HDL (Good Cholesterol) should be 40 or higher  * Your LDL (Bad Cholesterol) should be 100 or lower  * Your Triglycerides should be 150 or lower  * Your Urine microalbumin (kidney function) should be <30 * Your Body Mass Index should be 25 or lower   We will be glad to help you achieve these goals. Our telephone number is: [phone].

## 2013-08-01 NOTE — Progress Notes (Signed)
08/01/2013 Called by Donald Prose, NP, regarding patient's insulin regimen. It appears that I had the wrong information about both the patient's insulin regimen and her sugars at the time of the visit yesterday. - Patient is on 25 units of Levemir twice a day, instead of 40 units twice a day. - The sugars that were brought to me from the nursing home facility were all drawn at various times after a meal, not before The nursing home did not get my orders yet. Martie Lee will call the daughter and will have them forwarded. However, she has some of the sugars checked before meals in last 2 days, and they are: - 10/19: a.m. 60, noontime 273, evening 199 - 10/20: a.m. 143, noontime-not checked as she was at a point with me, evening 190 - 10/21: a.m. 158 She is wondering whether we should just continue the Levemir for now, and only add sliding scale if needed. She is worried that patient's blood sugars might not be checked pre-meal and insulin my not be given in a timely fashion at the facility, and this can be a risk for the patient. I agree with her assessment and plan, and I advised her to hold mealtime NovoLog for now, and only continue with Levemir 25 units twice a day. I would reevaluate the patient when she returns, in a month, I would also recheck a hemoglobin A1c at that time. I advised him to use the sugar log that I sent with the daughter, as this allows him to document the sugars in relationship with the meal.

## 2013-08-01 NOTE — Progress Notes (Signed)
06/29/2013   Patient:  Regina [name]  DOB: 10-Sep-1941  MR#:  [id number] Room#:  807  Facility:  [facility]  CODE STATUS:  NOT RESUSCITATE  Allergies: Penicillin, rash Cipro, rash Codeine, nervous/shaky Duloxetine, reaction unknown Hydrochlorothiazide, with triamterene, hyponatremia Ticlopidine HCl, bleeding  HPI: Patient hospitalized in August with DKA, discharged and two days later readmitted with falling and mental status changes.  Ongoing poorly controlled diabetes.  Patient discharged on August 29th, and sent for PT/OT, for strengthening and facilitate doing ADLs.       Social History: Pt has lived alone preparing her food in a microwave Past history of tobacco abuse, smoking > 1ppd x 40 years, no longer smokes. 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 Heart murmur Anemia GERD Vulvular cancer  Current Medications: Metformin, 500 mg one twice a day Cytomel 25 mcg once a day Ranitidine 75mg  twice a day Simvastatin 20 mg each evening Valsartan 10/20 one tablet each day Calcium 1200 mg each day Coreg 12.5 mg twice a day Aggrenox 200/25 one tablet twice a day Ferrous Fumerate 325mg  once a day Fish Oil 2000 mg each day Synthroid 50 mcg each day Levemir 5 units twice a day Humalog, via sliding scale with a max dose of 9 units when CBG is from 351-399 Travoprost ophthalmic solution, 0.004%, one drop to each eye at bedtime Lexapro 10mg  each day  Significant Diagnostic Studies: 06/13/2013 HgbA1c 9.4, with an average daily blood sugar of 227 Sodium 138 Potassium 4.1 Chloride 106 CO2 23 AGAP 9 Glucose 365 BUN 17 Creatinine 0.8 BUN/CR 21.6 calcium 9.9  GFR greater than 60  Total protein 5.7 Albumin 3.4 GL OB 2.3 ALP/GLO BP 1.5 Total  bilirubin 0.7 Direct bilirubin 0.1 ALP 60 AST 29 ALT 37  Cholesterol 138 HDL 36 LDL 64 VLDL 38.2 Triglycerides 191 CH/HDL 3.8  Thyroid-stimulating hormone 0.0460  Review of systems: Respiratory negative for cough or shortness of breath Gastrointestinal: Positive for report by family of incontinence, no report of constipation.  Difficulty with diarrhea.  Patient reports diarrhea improved, with better dietary intake.  Chart does demonstrate soft formed stools. Integumentary no report of any compromise of skin integrity Concern of anxiety/depression, patient recently started on Lexapro. Muscle/skeletal Pt now ambulating with a walker  Vital Signs: BP 124/68 Pulse 64 Respirations 17 Temperature 96  Physical examination: Patient is alert, is able to answer simple questions, has lost all simple math skills. Eyes, pale pink conjunctiva, extraocular movements intact. Ears, tympanic membranes unremarkable Oropharynx multiple broken teeth Neck is supple, no palpable adenopathy, no palpable thyromegaly. Question of carotid bruit left side versus reflected sound of heart murmur. Bilateral breath sounds are essentially clear, respirations easy and unlabored. Apical pulse, a loud, harsh, blowing murmur, best appreciated left sternal border second intercostal space, approximately 4-5/6.  Only lower extremity edema appreciated and ankles bilaterally and approximately 1-2+. No edema is noted extending up the legs. Abdomen is soft and nontender. Bowel sounds are normal throughout.  No odor of stool noted. Able to move all extremities, some bruises noted across the left lower extremity.  Assessment/plan: Would anticipate pt would need following of rountine labs, including Hgb A1c. Patient will be discharged on her current insulin regimen and is scheduled with endocrinology. Will decrease her synthroid to per day and recommend  rechecking TSH in approx. one month.  Would also suggest  implementing a toileting schedule if possible. Pt  was started on Lexapro on 09/04, now on 10mg .  Pt assessed and evaluated by contracted psychiatric services. Was started on Remeron 7.5mg  each day and magic cup with meals to help stimulate appetite and additional treatment for her depression.  Magic cup implemented for additional protein.    Pt and daughter encouraged to facilitate fluid intake, including caffeinated diet beverages. Pt's metformin dose was cut in half to minimize GI upset Unfortunately patient remains very frail but opts to be discharged, r/t insurance coverage.  Has been cautioned more than one discipline that further rehab is needed. The patient has been on the Lexapro for 2 weeks, and would anticipate a good response.   Patient is scheduled to be discharged. She will be going home with home health services, including nursing, physical therapy, and occupational therapy. She will be living with her daughter.

## 2013-08-10 ENCOUNTER — Encounter: Payer: Self-pay | Admitting: Internal Medicine

## 2013-08-31 NOTE — Progress Notes (Signed)
Patient ID: Regina [name], female   DOB: [birth year], 72 y.o.   MRN: [id number]   06/29/2013  Patient: Regina [name] DOB: 1941/01/30 MR#: [id number] Room#: 807 Facility: [facility]  CODE STATUS: NOT RESUSCITATE  Allergies:  Penicillin, rash  Cipro, rash  Codeine, nervous/shaky  Duloxetine, reaction unknown  Hydrochlorothiazide, with triamterene, hyponatremia  Ticlopidine HCl, bleeding  HPI:  Patient hospitalized in August with DKA, discharged and two days later readmitted with falling and mental status changes. Ongoing poorly controlled diabetes. Patient discharged on August 29th, and sent for PT/OT, for strengthening and facilitate doing ADLs.  Social History:  Pt has lived alone preparing her food in a microwave  Past history of tobacco abuse, smoking > 1ppd x 40 years, no longer smokes.  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  Heart murmur  Anemia  GERD  Vulvular cancer  Current Medications:  Metformin, 500 mg one twice a day  Cytomel 25 mcg once a day  Ranitidine 75mg  twice a day  Simvastatin 20 mg each evening  Valsartan 10/20 one tablet each day  Calcium 1200 mg each day  Coreg 12.5 mg twice a day  Aggrenox 200/25 one tablet twice a day  Ferrous Fumerate 325mg  once a day  Fish Oil 2000 mg each day  Synthroid 50 mcg each day  Levemir 5 units twice a day  Humalog, via sliding scale with a max dose of 9 units when CBG is from 351-399  Travoprost ophthalmic solution, 0.004%, one drop to each eye at bedtime  Lexapro 10mg  each day  Significant Diagnostic Studies:  06/13/2013  HgbA1c 9.4, with an average daily blood sugar of 227  Sodium 138  Potassium 4.1  Chloride 106  CO2 23  AGAP 9  Glucose 365  BUN 17  Creatinine 0.8  BUN/CR 21.6  calcium 9.9  GFR greater than 60  Total protein 5.7  Albumin 3.4  GL OB 2.3  ALP/GLO BP 1.5  Total bilirubin 0.7  Direct bilirubin 0.1  ALP 60  AST 29  ALT 37  Cholesterol 138  HDL 36  LDL 64  VLDL 38.2  Triglycerides 191  CH/HDL 3.8  Thyroid-stimulating hormone 0.0460  Review of systems:  Respiratory negative for cough or shortness of breath  Gastrointestinal: Positive for report by family of incontinence, no report of constipation. Difficulty with diarrhea. Patient reports diarrhea improved, with better dietary intake. Chart does demonstrate soft formed stools.  Integumentary no report of any compromise of skin integrity  Concern of anxiety/depression, patient recently started on Lexapro.  Muscle/skeletal Pt now ambulating with a walker  Vital Signs:  BP 124/68  Pulse 64  Respirations 17  Temperature 96  Physical examination:  Patient is alert, is able to answer simple questions, has lost all simple math skills.  Eyes, pale pink conjunctiva, extraocular movements intact.  Ears, tympanic membranes unremarkable  Oropharynx multiple broken teeth  Neck is supple, no palpable adenopathy, no palpable thyromegaly. Question of carotid bruit left side versus reflected sound of heart murmur.  Bilateral breath sounds are essentially clear, respirations easy and unlabored.  Apical pulse, a loud, harsh, blowing murmur, best appreciated left sternal border second intercostal space, approximately 4-5/6. Only lower extremity edema appreciated and ankles bilaterally and approximately 1-2+. No edema is noted extending up the  legs.  Abdomen is soft and nontender. Bowel sounds are normal throughout. No odor of stool noted.  Able to move all extremities, some bruises noted across the left lower extremity.  Assessment/plan:  Would anticipate pt would need following of rountine labs, including Hgb A1c.  Patient will be discharged on her current insulin regimen and is scheduled with endocrinology.   Will decrease her synthroid to per day and recommend rechecking TSH in approx. one month.  Would also suggest implementing a toileting schedule if possible.  Pt was started on Lexapro on 09/04, now on 10mg . Pt assessed and evaluated by contracted psychiatric services.  Was started on Remeron 7.5mg  each day and magic cup with meals to help stimulate appetite and additional treatment for her depression. Magic cup implemented for additional protein.  Pt and daughter encouraged to facilitate fluid intake, including caffeinated diet beverages.  Pt's metformin dose was cut in half to minimize GI upset  Unfortunately patient remains very frail but opts to be discharged, r/t insurance coverage. Has been cautioned more than one discipline that further rehab is needed.  The patient has been on the Lexapro for 2 weeks, and would anticipate a good response.  Patient is scheduled to be discharged. She will be going home with home health services, including nursing, physical therapy, and occupational therapy. She will be living with her daughter.  This encounter was created in error - please disregard.

## 2013-09-01 ENCOUNTER — Ambulatory Visit: Payer: Medicare Other | Admitting: Internal Medicine

## 2013-09-13 ENCOUNTER — Encounter: Payer: Self-pay | Admitting: *Deleted

## 2013-09-13 ENCOUNTER — Encounter: Payer: Self-pay | Admitting: Internal Medicine

## 2013-09-15 ENCOUNTER — Ambulatory Visit: Payer: Medicare Other | Admitting: Internal Medicine

## 2013-09-18 NOTE — Progress Notes (Signed)
This encounter was created in error - please disregard.

## 2013-09-18 NOTE — Addendum Note (Signed)
Addended by: Zachery Dauer on: 09/18/2013 04:49 PM   Modules accepted: Level of Service, SmartSet

## 2013-09-28 NOTE — Progress Notes (Signed)
Patient ID: Regina [name], female   DOB: Jan 08, 1941, 72 y.o.   MRN: [id number]    06/29/2013   Patient:  Regina [name]  DOB: 10/20/1940  MR#:  [id number] Room#:  807  Facility:  [facility]  CODE STATUS:  NOT RESUSCITATE  Allergies: Penicillin, rash Cipro, rash Codeine, nervous/shaky Duloxetine, reaction unknown Hydrochlorothiazide, with triamterene, hyponatremia Ticlopidine HCl, bleeding  Chief Complaint  Patient presents with  . Discharge Note    HPI: Patient hospitalized in August with DKA, discharged and two days later readmitted with falling and mental status changes.  Ongoing poorly controlled diabetes.  Patient discharged on August 29th, and sent for PT/OT, for strengthening and facilitate doing ADLs.       Social History: Pt has lived alone preparing her food in a microwave Past history of tobacco abuse, smoking > 1ppd x 40 years, no longer smokes. 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 Heart murmur Anemia GERD Vulvular cancer  Current Medications: Metformin, 500 mg one twice a day Cytomel 25 mcg once a day Ranitidine 75mg  twice a day Simvastatin 20 mg each evening Valsartan 10/20 one tablet each day Calcium 1200 mg each day Coreg 12.5 mg twice a day Aggrenox 200/25 one tablet twice a day Ferrous Fumerate 325mg  once a day Fish Oil 2000 mg each day Synthroid 50 mcg each day Levemir 5 units twice a day Humalog, via sliding scale with a max dose of 9 units when CBG is from 351-399 Travoprost ophthalmic solution, 0.004%, one drop to each eye at bedtime Lexapro 10mg  each day  Significant Diagnostic Studies: 06/13/2013 HgbA1c 9.4, with an average daily blood sugar of 227 Sodium 138 Potassium 4.1 Chloride 106 CO2 23 AGAP  9 Glucose 365 BUN 17 Creatinine 0.8 BUN/CR 21.6 calcium 9.9   GFR greater than 60  Total protein 5.7 Albumin 3.4 GL OB 2.3 ALP/GLO BP 1.5 Total bilirubin 0.7 Direct bilirubin 0.1 ALP 60 AST 29 ALT 37  Cholesterol 138 HDL 36 LDL 64 VLDL 38.2 Triglycerides 191 CH/HDL 3.8  Thyroid-stimulating hormone 0.0460  Review of systems: Respiratory negative for cough or shortness of breath Gastrointestinal: Positive for report by family of incontinence, no report of constipation.  Difficulty with diarrhea.  Patient reports diarrhea improved, with better dietary intake.  Chart does demonstrate soft formed stools. Integumentary no report of any compromise of skin integrity Concern of anxiety/depression, patient recently started on Lexapro. Muscle/skeletal Pt now ambulating with a walker  Vital Signs: BP 124/68 Pulse 64 Respirations 17 Temperature 96  Physical examination: Patient is alert, is able to answer simple questions, has lost all simple math skills. Eyes, pale pink conjunctiva, extraocular movements intact. Ears, tympanic membranes unremarkable Oropharynx multiple broken teeth Neck is supple, no palpable adenopathy, no palpable thyromegaly. Question of carotid bruit left side versus reflected sound of heart murmur. Bilateral breath sounds are essentially clear, respirations easy and unlabored. Apical pulse, a loud, harsh, blowing murmur, best appreciated left sternal border second intercostal space, approximately 4-5/6.  Only lower extremity edema appreciated and ankles bilaterally and approximately 1-2+. No edema is noted extending up the legs. Abdomen is soft and nontender. Bowel sounds are normal throughout.  No odor of stool noted. Able to move all extremities, some bruises noted across the left lower extremity.  Assessment/plan: Would anticipate pt would need following of rountine labs,  including Hgb A1c.  Patient will be discharged on her current insulin regimen  and is scheduled with endocrinology.  Will decrease her synthroid to per day and recommend  rechecking TSH in approx. one month.     Would also suggest implementing a toileting schedule if possible.  Pt  was started on Lexapro on 09/04, now on 10mg .  Pt assessed and evaluated by contracted psychiatric services.  Was started on Remeron 7.5mg  each day and magic cup with meals to help stimulate appetite and additional treatment for her depression.  Magic cup implemented for additional protein.      Pt and daughter encouraged to facilitate fluid intake, including caffeinated diet beverages  Pt's metformin dose was cut in half to minimize GI upset  Unfortunately patient remains very frail but opts to be discharged, r/t insurance coverage. Has been cautioned by other disciplines that further rehab is needed.   Patient is scheduled to be discharged. She will be going home with home health services, including nursing, physical therapy, and occupational therapy. She will be living with her daughter.  Pt, has very diminished vision and generalized weakness, she is at very high risk for falling and PT/OT is essential for strengthening, optimal ability to do own ADL's as much as possible.  Pt's diabetes is very poorly controlled, Home Health Nurse will help facilitate dietary changes/management, and assess pt's response to her meds, communicate with her health care provider, and implement necessary changes in regimen.  Home Health Nurse could also draw blood for any labs to monitor pt's physiological status.

## 2013-09-28 NOTE — Progress Notes (Signed)
This encounter was created in error - please disregard.

## 2013-09-28 NOTE — Addendum Note (Signed)
Addended by: Zachery Dauer on: 09/28/2013 02:48 PM   Modules accepted: Level of Service, SmartSet

## 2013-09-29 ENCOUNTER — Encounter: Payer: Self-pay | Admitting: Internal Medicine

## 2013-09-29 ENCOUNTER — Ambulatory Visit (INDEPENDENT_AMBULATORY_CARE_PROVIDER_SITE_OTHER): Payer: Medicare Other | Admitting: Internal Medicine

## 2013-09-29 VITALS — BP 132/74 | HR 74 | Ht 62.0 in | Wt 201.3 lb

## 2013-09-29 DIAGNOSIS — E039 Hypothyroidism, unspecified: Secondary | ICD-10-CM

## 2013-09-29 DIAGNOSIS — I350 Nonrheumatic aortic (valve) stenosis: Secondary | ICD-10-CM

## 2013-09-29 DIAGNOSIS — I10 Essential (primary) hypertension: Secondary | ICD-10-CM

## 2013-09-29 DIAGNOSIS — I359 Nonrheumatic aortic valve disorder, unspecified: Secondary | ICD-10-CM

## 2013-09-29 DIAGNOSIS — E1165 Type 2 diabetes mellitus with hyperglycemia: Secondary | ICD-10-CM

## 2013-09-29 DIAGNOSIS — Z8679 Personal history of other diseases of the circulatory system: Secondary | ICD-10-CM

## 2013-09-29 DIAGNOSIS — R4182 Altered mental status, unspecified: Secondary | ICD-10-CM

## 2013-09-29 NOTE — Progress Notes (Signed)
OFFICE NOTE  Chief Complaint:  Fatigue, falls, dizziness, failure to thrive, recent strokes.  Primary Care Physician: Tomi Bamberger, NP  HPI:  Regina [name] is a 72 year old female with history of abnormal echocardiogram in the past. She also has a history of hypertension, diabetes, and a heart murmur first identified in the '60s as well as a TIA in the past. She had an echocardiogram which showed moderate to severe aortic stenosis a year ago. We recently repeated that echocardiogram on Feb 14, 2013, which showed an EF of 55% to 60% as well as a grade 1 diastolic dysfunction. There was at least moderate aortic stenosis with a valve area about 1.1 sq cm. Peak gradient was 56, slightly up from 54 on the previous study a year ago. She has really had a complicated medical course over the last 1 month. She was recently admitted twice at [facility]-06/04/13-06/06/13 (DKA) & 06/07/13-06/09/13 (metabolic encephalopathy, uncontrolled DM and CVA). Following the last discharge, patient was sent to rehabilitation where she was for approximately 3 weeks. Patient has had a sustained rapid decline at SNF. 2 days after going to SNF, she developed profuse diarrhea of 9 days duration, extremely poor appetite, lethargy, laying in bed and unwilling to participate in therapies, decreased urine output and progressive generalized weakness"  -admitted with encephalopathy, UTI, c diff.  Charge the hospital but continues to have poorly controlled blood sugars fatigue, dizziness, falls and failure to thrive. She's also had significant weight loss. Today on her way in to the office she had a fall and has some superficial abrasions to her knee, hand and left face. There is no loss of consciousness from this episode.  PMHx:  Past Medical History  Diagnosis Date  . Hypertension   . Hyperlipidemia   . TIA (transient ischemic attack) 1990's    "mini strokes" (06/07/2013)  . Legally blind     "both eyes; some vision in the right'  (06/07/2013)  . Heart murmur 1960's  . Hypothyroidism   . Type II diabetes mellitus   . Anemia   . GERD (gastroesophageal reflux disease)   . Arthritis     "left leg" (06/07/2013)  . Depression   . Vulva cancer   . Altered mental status     "the first time I noticed any problem was 2 d ago" (06/07/2013)  . Aortic stenosis     mod-severe    Past Surgical History  Procedure Laterality Date  . Femur fracture surgery Left 1999  . Cesarean section  1968  . Tubal ligation  1970's  . Vulva surgery  1990's    "cut a big chunk out for cancer" (06/07/2013)  . Transthoracic echocardiogram  02/2013    EF 55-60%, mod conc hypertrophy, grade 1 diastolic dysfunction; AV with mod stenosis; calcified MV annulus & mild MR; LA severely dilated; PA peak pressure    FAMHx:  Family History  Problem Relation Age of Onset  . Hypertension Mother   . Hyperlipidemia Mother   . Hyperlipidemia Father   . Hypertension Father   . Diabetes Father     SOCHx:   reports that she quit smoking about 26 years ago. Her smoking use included Cigarettes. She has a 120 pack-year smoking history. She has never used smokeless tobacco. She reports that she does not drink alcohol or use illicit drugs.  ALLERGIES:  Allergies  Allergen Reactions  . Amoxicillin     REACTION: rash  . Ciprofloxacin     REACTION: hives  .  Codeine     REACTION: nervous, shaky  . Duloxetine     REACTION: reaction not known  . Hydrochlorothiazide W-Triamterene     REACTION: hyponatremia  . Ticlopidine Hcl     REACTION: bleeding    ROS: A comprehensive review of systems was negative except for: Constitutional: positive for fatigue and weight loss Respiratory: positive for dyspnea on exertion Cardiovascular: positive for aortic murmur Musculoskeletal: positive for muscle weakness Neurological: positive for coordination problems, dizziness, memory problems and weakness Behavioral/Psych: positive for anxiety and  depression  HOME MEDS: Current Outpatient Prescriptions  Medication Sig Dispense Refill  . acidophilus (RISAQUAD) CAPS capsule Take 2 capsules by mouth daily.      Marland Kitchen amLODipine (NORVASC) 10 MG tablet Take 0.5 tablets (5 mg total) by mouth daily.      Marland Kitchen aspirin 81 MG tablet Take 81 mg by mouth daily.      Marland Kitchen atenolol (TENORMIN) 100 MG tablet Take 100 mg by mouth daily.      . Calcium Carbonate-Vit D-Min (CALCIUM 1200 PO) Take 1 tablet by mouth daily.      . carvedilol (COREG) 12.5 MG tablet Take 1 tablet (12.5 mg total) by mouth 2 (two) times daily with a meal.  60 tablet  0  . DIAZEPAM PO Take 1.5 mg by mouth at bedtime.      . dipyridamole-aspirin (AGGRENOX) 200-25 MG per 12 hr capsule Take 1 capsule by mouth 2 (two) times daily.      Marland Kitchen ezetimibe (ZETIA) 10 MG tablet Take 10 mg by mouth daily.      . ferrous fumarate (HEMOCYTE - 106 MG FE) 325 (106 FE) MG TABS tablet Take 1 tablet by mouth daily.      . insulin aspart (NOVOLOG) 100 UNIT/ML injection Inject under skin 3x a day up to 20 units a day as advised  1 vial  12  . insulin detemir (LEVEMIR) 100 UNIT/ML injection Inject 0.3 mLs (30 Units total) into the skin 2 (two) times daily.  10 mL  12  . levothyroxine (SYNTHROID, LEVOTHROID) 75 MCG tablet Take 1 tablet (75 mcg total) by mouth daily before breakfast.  30 tablet  0  . liothyronine (CYTOMEL) 25 MCG tablet Take 25 mcg by mouth 2 (two) times daily.      . Multiple Vitamin (MULTIVITAMIN WITH MINERALS) TABS tablet Take 1 tablet by mouth daily.      . Omega-3 Fatty Acids (FISH OIL) 1000 MG CAPS Take 2,000 mg by mouth daily.      . ranitidine (ZANTAC) 75 MG tablet Take 75 mg by mouth 2 (two) times daily.      . simvastatin (ZOCOR) 20 MG tablet Take 1 tablet (20 mg total) by mouth every evening.  30 tablet  0  . Travoprost, BAK Free, (TRAVATAN) 0.004 % SOLN ophthalmic solution Place 1 drop into both eyes at bedtime.       No current facility-administered medications for this visit.     LABS/IMAGING: No results found for this or any previous visit (from the past 48 hour(s)). No results found.  VITALS: BP 132/74  Pulse 74  Ht 5\' 2"  (1.575 m)  Wt 201 lb 4.8 oz (91.309 kg)  BMI 36.81 kg/m2  EXAM: General appearance: alert and appears tired, legally blind Neck: no carotid bruit and no JVD Lungs: clear to auscultation bilaterally Heart: regular rate and rhythm, S1, S2 normal and systolic murmur: mid to late systolic ejection murmur 3/6, crescendo at 2nd right intercostal space  Abdomen: soft, non-tender; bowel sounds normal; no masses,  no organomegaly Extremities: edema trace bilateral edema, there are superficial abrasion over the right knee and left hand Pulses: 2+ and symmetric Skin: Skin color, texture, turgor normal. No rashes or lesions Neurologic: Mental status: Awake, interacts normally, but appears flat, perhaps depressed Psych: Appears flat, fatigued   EKG: Normal sinus rhythm at 74  ASSESSMENT: 1. Weight loss, confusion, failure to thrive, encephalopathy 2. Moderate to severe aortic stenosis 3. Poorly-controlled insulin-dependent diabetes 4. Dyslipidemia 5. Hypertension - at goal  PLAN: 1.   Regina [name] has been struggling with numerous problems including weight loss, diarrhea, encephalopathy, confusion, weakness and failure to thrive. This is on top of at least moderate to severe aortic stenosis which may likely progress to significant regarding intervention within the next 5 years.  She also has poorly controlled diabetes which is also making her valvular stenosis worse. She is on cholesterol medication for dyslipidemia and her hypertension is controlled. Unfortunately she's continued to have falls and in fact fell on her way in to the office today. I do not appreciate any significant injuries. She is currently on aspirin and Aggrenox for her recurrent strokes. There are no palpitations and her EKG is normal. I do not see any signs of atrial  fibrillation. I plan to see her back in 6 months with a repeat echo at that time to further evaluate for progression of her aortic stenosis.  Chrystie Nose, MD, [facility] Attending Cardiologist [facility]  Tyrion Glaude C 09/29/2013, 9:54 AM

## 2013-09-29 NOTE — Patient Instructions (Signed)
Your physician has requested that you have an echocardiogram. Echocardiography is a painless test that uses sound waves to create images of your heart. It provides your doctor with information about the size and shape of your heart and how well your heart's chambers and valves are working. This procedure takes approximately one hour. There are no restrictions for this procedure. Please schedule this for 6 months from now.   Your physician wants you to follow-up in: 6 months. You will receive a reminder letter in the mail two months in advance. If you don't receive a letter, please call our office to schedule the follow-up appointment.

## 2013-10-10 ENCOUNTER — Encounter (HOSPITAL_COMMUNITY): Payer: Self-pay | Admitting: Emergency Medicine

## 2013-10-10 ENCOUNTER — Inpatient Hospital Stay (HOSPITAL_COMMUNITY)
Admission: EM | Admit: 2013-10-10 | Discharge: 2013-10-12 | DRG: 689 | Disposition: A | Discharge status: Home / Self Care | Payer: Medicare Other | Attending: Internal Medicine | Admitting: Internal Medicine

## 2013-10-10 ENCOUNTER — Emergency Department (HOSPITAL_COMMUNITY): Payer: Medicare Other

## 2013-10-10 DIAGNOSIS — Z8249 Family history of ischemic heart disease and other diseases of the circulatory system: Secondary | ICD-10-CM

## 2013-10-10 DIAGNOSIS — R569 Unspecified convulsions: Secondary | ICD-10-CM | POA: Diagnosis present

## 2013-10-10 DIAGNOSIS — Z885 Allergy status to narcotic agent status: Secondary | ICD-10-CM

## 2013-10-10 DIAGNOSIS — I509 Heart failure, unspecified: Secondary | ICD-10-CM | POA: Diagnosis present

## 2013-10-10 DIAGNOSIS — Z79899 Other long term (current) drug therapy: Secondary | ICD-10-CM

## 2013-10-10 DIAGNOSIS — G9340 Encephalopathy, unspecified: Secondary | ICD-10-CM

## 2013-10-10 DIAGNOSIS — F329 Major depressive disorder, single episode, unspecified: Secondary | ICD-10-CM | POA: Diagnosis present

## 2013-10-10 DIAGNOSIS — F411 Generalized anxiety disorder: Secondary | ICD-10-CM | POA: Diagnosis present

## 2013-10-10 DIAGNOSIS — Z66 Do not resuscitate: Secondary | ICD-10-CM | POA: Diagnosis present

## 2013-10-10 DIAGNOSIS — A498 Other bacterial infections of unspecified site: Secondary | ICD-10-CM | POA: Diagnosis present

## 2013-10-10 DIAGNOSIS — I10 Essential (primary) hypertension: Secondary | ICD-10-CM | POA: Diagnosis present

## 2013-10-10 DIAGNOSIS — Z880 Allergy status to penicillin: Secondary | ICD-10-CM

## 2013-10-10 DIAGNOSIS — E7800 Pure hypercholesterolemia, unspecified: Secondary | ICD-10-CM | POA: Diagnosis present

## 2013-10-10 DIAGNOSIS — N179 Acute kidney failure, unspecified: Secondary | ICD-10-CM

## 2013-10-10 DIAGNOSIS — K219 Gastro-esophageal reflux disease without esophagitis: Secondary | ICD-10-CM | POA: Diagnosis present

## 2013-10-10 DIAGNOSIS — Z833 Family history of diabetes mellitus: Secondary | ICD-10-CM

## 2013-10-10 DIAGNOSIS — Z8673 Personal history of transient ischemic attack (TIA), and cerebral infarction without residual deficits: Secondary | ICD-10-CM | POA: Diagnosis present

## 2013-10-10 DIAGNOSIS — I272 Pulmonary hypertension, unspecified: Secondary | ICD-10-CM | POA: Diagnosis present

## 2013-10-10 DIAGNOSIS — E871 Hypo-osmolality and hyponatremia: Secondary | ICD-10-CM | POA: Diagnosis present

## 2013-10-10 DIAGNOSIS — R4182 Altered mental status, unspecified: Secondary | ICD-10-CM | POA: Diagnosis present

## 2013-10-10 DIAGNOSIS — F3289 Other specified depressive episodes: Secondary | ICD-10-CM | POA: Diagnosis present

## 2013-10-10 DIAGNOSIS — Z6836 Body mass index (BMI) 36.0-36.9, adult: Secondary | ICD-10-CM

## 2013-10-10 DIAGNOSIS — H548 Legal blindness, as defined in USA: Secondary | ICD-10-CM | POA: Diagnosis present

## 2013-10-10 DIAGNOSIS — Z888 Allergy status to other drugs, medicaments and biological substances status: Secondary | ICD-10-CM

## 2013-10-10 DIAGNOSIS — R739 Hyperglycemia, unspecified: Secondary | ICD-10-CM

## 2013-10-10 DIAGNOSIS — Z87891 Personal history of nicotine dependence: Secondary | ICD-10-CM

## 2013-10-10 DIAGNOSIS — E785 Hyperlipidemia, unspecified: Secondary | ICD-10-CM | POA: Diagnosis present

## 2013-10-10 DIAGNOSIS — I5032 Chronic diastolic (congestive) heart failure: Secondary | ICD-10-CM | POA: Diagnosis present

## 2013-10-10 DIAGNOSIS — I639 Cerebral infarction, unspecified: Secondary | ICD-10-CM

## 2013-10-10 DIAGNOSIS — I359 Nonrheumatic aortic valve disorder, unspecified: Secondary | ICD-10-CM | POA: Diagnosis present

## 2013-10-10 DIAGNOSIS — I5030 Unspecified diastolic (congestive) heart failure: Secondary | ICD-10-CM | POA: Diagnosis present

## 2013-10-10 DIAGNOSIS — N390 Urinary tract infection, site not specified: Principal | ICD-10-CM | POA: Diagnosis present

## 2013-10-10 DIAGNOSIS — E039 Hypothyroidism, unspecified: Secondary | ICD-10-CM | POA: Diagnosis present

## 2013-10-10 DIAGNOSIS — Z8544 Personal history of malignant neoplasm of other female genital organs: Secondary | ICD-10-CM

## 2013-10-10 DIAGNOSIS — R443 Hallucinations, unspecified: Secondary | ICD-10-CM

## 2013-10-10 DIAGNOSIS — M129 Arthropathy, unspecified: Secondary | ICD-10-CM | POA: Diagnosis present

## 2013-10-10 DIAGNOSIS — E1101 Type 2 diabetes mellitus with hyperosmolarity with coma: Secondary | ICD-10-CM | POA: Diagnosis present

## 2013-10-10 DIAGNOSIS — E1100 Type 2 diabetes mellitus with hyperosmolarity without nonketotic hyperglycemic-hyperosmolar coma (NKHHC): Secondary | ICD-10-CM | POA: Diagnosis present

## 2013-10-10 DIAGNOSIS — D509 Iron deficiency anemia, unspecified: Secondary | ICD-10-CM | POA: Diagnosis present

## 2013-10-10 DIAGNOSIS — E43 Unspecified severe protein-calorie malnutrition: Secondary | ICD-10-CM | POA: Diagnosis present

## 2013-10-10 LAB — GLUCOSE, CAPILLARY: Glucose-Capillary: 231 mg/dL — ABNORMAL HIGH (ref 70–99)

## 2013-10-10 LAB — URINALYSIS, ROUTINE W REFLEX MICROSCOPIC
Bilirubin Urine: NEGATIVE
Glucose, UA: >1000 mg/dL — AB
Ketones, ur: NEGATIVE mg/dL
Nitrite: NEGATIVE
Protein, ur: 100 mg/dL — AB
Specific Gravity, Urine: 1.023 (ref 1.005–1.030)
Urobilinogen, UA: 0.2 mg/dL (ref 0.0–1.0)
pH: 5.5 (ref 5.0–8.0)

## 2013-10-10 LAB — SALICYLATE LEVEL: Salicylate Lvl: <2 mg/dL — ABNORMAL LOW (ref 2.8–20.0)

## 2013-10-10 LAB — CBC
HCT: 37.3 % (ref 36.0–46.0)
Hemoglobin: 12.6 g/dL (ref 12.0–15.0)
MCH: 29.4 pg (ref 26.0–34.0)
MCHC: 33.8 g/dL (ref 30.0–36.0)
MCV: 86.9 fL (ref 78.0–100.0)
RBC: 4.29 MIL/uL (ref 3.87–5.11)

## 2013-10-10 LAB — COMPREHENSIVE METABOLIC PANEL
ALT: 18 U/L (ref 0–35)
BUN: 15 mg/dL (ref 6–23)
CO2: 24 mEq/L (ref 19–32)
Calcium: 8.9 mg/dL (ref 8.4–10.5)
Creatinine, Ser: 0.87 mg/dL (ref 0.50–1.10)
GFR calc Af Amer: 76 mL/min — ABNORMAL LOW (ref >90)
GFR calc non Af Amer: 66 mL/min — ABNORMAL LOW (ref >90)
Glucose, Bld: 412 mg/dL — ABNORMAL HIGH (ref 70–99)
Total Bilirubin: 0.5 mg/dL (ref 0.3–1.2)
Total Protein: 6.5 g/dL (ref 6.0–8.3)

## 2013-10-10 LAB — RAPID URINE DRUG SCREEN, HOSP PERFORMED
Amphetamines: NOT DETECTED
Barbiturates: NOT DETECTED
Benzodiazepines: NOT DETECTED
Cocaine: NOT DETECTED
Opiates: NOT DETECTED
Tetrahydrocannabinol: NOT DETECTED

## 2013-10-10 LAB — URINE MICROSCOPIC-ADD ON

## 2013-10-10 LAB — ACETAMINOPHEN LEVEL: Acetaminophen (Tylenol), Serum: <15 ug/mL (ref 10–30)

## 2013-10-10 LAB — ETHANOL: Alcohol, Ethyl (B): <11 mg/dL (ref 0–11)

## 2013-10-10 MED ORDER — INSULIN ASPART 100 UNIT/ML ~~LOC~~ SOLN
8.0000 [IU] | Freq: Once | SUBCUTANEOUS | Status: AC
  Administered 2013-10-10: 8 [IU] via SUBCUTANEOUS

## 2013-10-10 MED ORDER — INSULIN DETEMIR 100 UNIT/ML ~~LOC~~ SOLN
30.0000 [IU] | Freq: Two times a day (BID) | SUBCUTANEOUS | Status: DC
  Filled 2013-10-10 (×2): qty 0.3

## 2013-10-10 MED ORDER — ONDANSETRON HCL 4 MG PO TABS: 4.0000 mg | ORAL_TABLET | Freq: Four times a day (QID) | ORAL | Status: DC | PRN

## 2013-10-10 MED ORDER — MORPHINE SULFATE 2 MG/ML IJ SOLN: 1.0000 mg | INTRAMUSCULAR | Status: DC | PRN

## 2013-10-10 MED ORDER — FERROUS FUMARATE 325 (106 FE) MG PO TABS
2.0000 | ORAL_TABLET | Freq: Every day | ORAL | Status: DC
  Administered 2013-10-11 – 2013-10-12 (×2): 212 mg via ORAL
  Filled 2013-10-10 (×3): qty 2

## 2013-10-10 MED ORDER — ACETAMINOPHEN 650 MG RE SUPP: 650.0000 mg | Freq: Four times a day (QID) | RECTAL | Status: DC | PRN

## 2013-10-10 MED ORDER — INSULIN ASPART 100 UNIT/ML ~~LOC~~ SOLN
0.0000 [IU] | Freq: Every day | SUBCUTANEOUS | Status: DC
  Administered 2013-10-10: 3 [IU] via SUBCUTANEOUS
  Administered 2013-10-11: 5 [IU] via SUBCUTANEOUS
  Filled 2013-10-10: qty 1

## 2013-10-10 MED ORDER — DEXTROSE 5 % IV SOLN
1.0000 g | Freq: Once | INTRAVENOUS | Status: AC
  Administered 2013-10-10: 1 g via INTRAVENOUS
  Filled 2013-10-10: qty 10

## 2013-10-10 MED ORDER — INSULIN DETEMIR 100 UNIT/ML ~~LOC~~ SOLN
20.0000 [IU] | Freq: Two times a day (BID) | SUBCUTANEOUS | Status: DC
  Administered 2013-10-11 (×2): 20 [IU] via SUBCUTANEOUS
  Filled 2013-10-10 (×3): qty 0.2

## 2013-10-10 MED ORDER — ACETAMINOPHEN 325 MG PO TABS: 650.0000 mg | ORAL_TABLET | Freq: Four times a day (QID) | ORAL | Status: DC | PRN

## 2013-10-10 MED ORDER — INSULIN ASPART 100 UNIT/ML ~~LOC~~ SOLN
0.0000 [IU] | Freq: Three times a day (TID) | SUBCUTANEOUS | Status: DC
  Administered 2013-10-11 (×2): 11 [IU] via SUBCUTANEOUS
  Administered 2013-10-11 – 2013-10-12 (×2): 15 [IU] via SUBCUTANEOUS
  Administered 2013-10-12: 4 [IU] via SUBCUTANEOUS
  Filled 2013-10-10 (×2): qty 1

## 2013-10-10 MED ORDER — ENOXAPARIN SODIUM 40 MG/0.4ML ~~LOC~~ SOLN
40.0000 mg | SUBCUTANEOUS | Status: DC
  Administered 2013-10-10 – 2013-10-11 (×2): 40 mg via SUBCUTANEOUS
  Filled 2013-10-10 (×3): qty 0.4

## 2013-10-10 MED ORDER — SIMVASTATIN 40 MG PO TABS
40.0000 mg | ORAL_TABLET | Freq: Every day | ORAL | Status: DC
  Administered 2013-10-10: 40 mg via ORAL
  Filled 2013-10-10 (×2): qty 1

## 2013-10-10 MED ORDER — LATANOPROST 0.005 % OP SOLN
1.0000 [drp] | Freq: Every day | OPHTHALMIC | Status: DC
  Administered 2013-10-10 – 2013-10-11 (×2): 1 [drp] via OPHTHALMIC
  Filled 2013-10-10 (×2): qty 2.5

## 2013-10-10 MED ORDER — SODIUM CHLORIDE 0.9 % IV BOLUS (SEPSIS)
500.0000 mL | Freq: Once | INTRAVENOUS | Status: AC
  Administered 2013-10-10: 500 mL via INTRAVENOUS

## 2013-10-10 MED ORDER — ASPIRIN-DIPYRIDAMOLE ER 25-200 MG PO CP12
1.0000 | ORAL_CAPSULE | Freq: Two times a day (BID) | ORAL | Status: DC
  Administered 2013-10-10 – 2013-10-12 (×4): 1 via ORAL
  Filled 2013-10-10 (×5): qty 1

## 2013-10-10 MED ORDER — DEXTROSE 5 % IV SOLN
1.0000 g | INTRAVENOUS | Status: DC
  Administered 2013-10-11: 1 g via INTRAVENOUS
  Filled 2013-10-10 (×3): qty 10

## 2013-10-10 MED ORDER — AMLODIPINE BESYLATE 5 MG PO TABS
5.0000 mg | ORAL_TABLET | Freq: Every day | ORAL | Status: DC
  Administered 2013-10-11: 5 mg via ORAL
  Filled 2013-10-10 (×2): qty 1

## 2013-10-10 MED ORDER — INSULIN DETEMIR 100 UNIT/ML ~~LOC~~ SOLN: 20.0000 [IU] | Freq: Two times a day (BID) | SUBCUTANEOUS | Status: DC

## 2013-10-10 MED ORDER — CARVEDILOL 12.5 MG PO TABS
12.5000 mg | ORAL_TABLET | Freq: Every day | ORAL | Status: DC
  Administered 2013-10-11: 12.5 mg via ORAL
  Filled 2013-10-10 (×2): qty 1

## 2013-10-10 MED ORDER — ONDANSETRON HCL 4 MG/2ML IJ SOLN: 4.0000 mg | Freq: Four times a day (QID) | INTRAMUSCULAR | Status: DC | PRN

## 2013-10-10 MED ORDER — LEVOTHYROXINE SODIUM 75 MCG PO TABS
75.0000 ug | ORAL_TABLET | Freq: Every day | ORAL | Status: DC
  Administered 2013-10-11 – 2013-10-12 (×2): 75 ug via ORAL
  Filled 2013-10-10 (×3): qty 1

## 2013-10-10 NOTE — H&P (Signed)
[facility] History and Physical  Regina [name] YNW:[id number] DOB: 26-Nov-1940 DOA: 10/10/2013  Referring physician: Linwood Dibbles, ER physician PCP: Tomi Bamberger, NP   Chief Complaint: Confusion  HPI: Regina [name] is a 72 y.o. female  With past medical history of hypertension and diabetes who was brought into the emergency room by family because of increasing hallucinations and agitation. The last few days, patient has started on her daughter that she been hearing voices and stating that there are people in the house trying to harm her. Patient has a history of anxiety and has been on Cymbalta, dose being increased several days ago  In emergency room patient was noted to have elevated blood sugar 400, not in DKA. She was also noted to have a large urinary tract infection. Rest of her labs were normal including normal white count and hemoglobin. Urine drug screen was negative. CT scan of the head was unremarkable. Patient was given a dose of Rocephin as well as insulin. Hospitalists were called for further evaluation   Review of Systems:  Patient seen down emergency room. Doing okay. Denies any headaches, vision changes, dysphasia, chest pain, palpitations, shortness of breath, wheeze, cough, abdominal pain, hematuria, dysuria, constipation, diarrhea, focal extremity numbness weakness or pain. Patient's only complaint really is of being tired.  Past Medical History  Diagnosis Date  . Hypertension   . Hyperlipidemia   . TIA (transient ischemic attack) 1990's    "mini strokes" (06/07/2013)  . Legally blind     "both eyes; some vision in the right' (06/07/2013)  . Heart murmur 1960's  . Hypothyroidism   . Type II diabetes mellitus   . Anemia   . GERD (gastroesophageal reflux disease)   . Arthritis     "left leg" (06/07/2013)  . Depression   . Vulva cancer   . Altered mental status     "the first time I noticed any problem was 2 d ago" (06/07/2013)  . Aortic stenosis     mod-severe    Past Surgical History  Procedure Laterality Date  . Femur fracture surgery Left 1999  . Cesarean section  1968  . Tubal ligation  1970's  . Vulva surgery  1990's    "cut a big chunk out for cancer" (06/07/2013)  . Transthoracic echocardiogram  02/2013    EF 55-60%, mod conc hypertrophy, grade 1 diastolic dysfunction; AV with mod stenosis; calcified MV annulus & mild MR; LA severely dilated; PA peak pressure   Social History:  reports that she quit smoking about 26 years ago. Her smoking use included Cigarettes. She has a 120 pack-year smoking history. She has never used smokeless tobacco. She reports that she does not drink alcohol or use illicit drugs. patient lives alone. Her daughter lives next door. Patient states that she normally gets around pretty well without use  Of a walker  Allergies  Allergen Reactions  . Amoxicillin     REACTION: rash  . Ciprofloxacin     REACTION: hives  . Codeine     REACTION: nervous, shaky  . Duloxetine     REACTION: reaction not known  . Hydrochlorothiazide W-Triamterene     REACTION: hyponatremia  . Ticlopidine Hcl     REACTION: bleeding    Family History  Problem Relation Age of Onset  . Hypertension Mother   . Hyperlipidemia Mother   . Hyperlipidemia Father   . Hypertension Father   . Diabetes Father      Prior to Admission medications  Medication Sig Start Date End Date Taking? Authorizing Provider  acidophilus (RISAQUAD) CAPS capsule Take 2 capsules by mouth daily. 07/22/13  Yes Esperanza Sheets, MD  amLODipine (NORVASC) 10 MG tablet Take 0.5 tablets (5 mg total) by mouth daily. 07/22/13  Yes Esperanza Sheets, MD  calcium-vitamin D (OSCAL WITH D) 500-200 MG-UNIT per tablet Take 1 tablet by mouth 2 (two) times daily.   Yes Historical Provider, MD  carvedilol (COREG) 12.5 MG tablet Take 12.5 mg by mouth daily. 06/06/13  Yes Nishant Dhungel, MD  dipyridamole-aspirin (AGGRENOX) 200-25 MG per 12 hr capsule Take 1 capsule by mouth  2 (two) times daily. 06/09/13  Yes Shanker Levora Dredge, MD  ezetimibe (ZETIA) 10 MG tablet Take 10 mg by mouth daily.   Yes Historical Provider, MD  ferrous fumarate (HEMOCYTE - 106 MG FE) 325 (106 FE) MG TABS tablet Take 2 tablets by mouth daily.    Yes Historical Provider, MD  insulin aspart (NOVOLOG) 100 UNIT/ML injection Inject 10 Units into the skin 3 (three) times daily with meals. If blood sugar > 200. 07/31/13  Yes Carlus Pavlov, MD  insulin detemir (LEVEMIR) 100 UNIT/ML injection Inject 0.3 mLs (30 Units total) into the skin 2 (two) times daily. 07/31/13  Yes Carlus Pavlov, MD  levothyroxine (SYNTHROID, LEVOTHROID) 75 MCG tablet Take 1 tablet (75 mcg total) by mouth daily before breakfast. 06/07/13  Yes Nishant Dhungel, MD  Multiple Vitamin (MULTIVITAMIN WITH MINERALS) TABS tablet Take 1 tablet by mouth daily.   Yes Historical Provider, MD  Omega-3 Fatty Acids (FISH OIL) 1000 MG CAPS Take 2,000 mg by mouth daily.   Yes Historical Provider, MD  ranitidine (ZANTAC) 75 MG tablet Take 75 mg by mouth 2 (two) times daily.   Yes Historical Provider, MD  simvastatin (ZOCOR) 40 MG tablet Take 40 mg by mouth at bedtime.   Yes Historical Provider, MD  Travoprost, BAK Free, (TRAVATAN) 0.004 % SOLN ophthalmic solution Place 1 drop into both eyes at bedtime.   Yes Historical Provider, MD   Physical Exam: Filed Vitals:   10/10/13 1820  BP: 140/69  Pulse: 81  Temp:   Resp: 20    BP 140/69  Pulse 81  Temp(Src) 98.1 F (36.7 C) (Oral)  Resp 20  SpO2 98%  General:  Appears calm and comfortable, no acute distress, alert and oriented at least x2 Eyes: Sclera nonicteric, extraocular movements are intact ENT: Normocephalic and atraumatic mucous membranes are moist Neck: Supple, no JVD Cardiovascular: Regular rate and rhythm, S1-S2 Telemetry: SR, no arrhythmias  Respiratory: CTA bilaterally, no w/r/r. Normal respiratory effort. Abdomen: Soft, nontender, nondistended, positive bowel  sounds Skin: No skin breaks, tears or lesions Musculoskeletal: No clubbing or cyanosis or edema Psychiatric: She is actually quite appropriate with slightly flattened affect. While she is calm and oriented x3, she states that she was brought in because people were trying to attack her in her house. Which she genuinely believes. Neurologic: grossly non-focal.          Labs on Admission:  Basic Metabolic Panel:  Recent Labs Lab 10/10/13 1349  NA 134*  K 4.3  CL 95*  CO2 24  GLUCOSE 412*  BUN 15  CREATININE 0.87  CALCIUM 8.9   Liver Function Tests:  Recent Labs Lab 10/10/13 1349  AST 22  ALT 18  ALKPHOS 74  BILITOT 0.5  PROT 6.5  ALBUMIN 3.3*   No results found for this basename: LIPASE, AMYLASE,  in the last 168 hours No  results found for this basename: AMMONIA,  in the last 168 hours CBC:  Recent Labs Lab 10/10/13 1349  WBC 7.0  HGB 12.6  HCT 37.3  MCV 86.9  PLT 226   Cardiac Enzymes: No results found for this basename: CKTOTAL, CKMB, CKMBINDEX, TROPONINI,  in the last 168 hours  BNP (last 3 results) No results found for this basename: PROBNP,  in the last 8760 hours CBG:  Recent Labs Lab 10/10/13 1339 10/10/13 1818  GLUCAP 381* 257*    Radiological Exams on Admission: Ct Head Wo Contrast  10/10/2013   CLINICAL DATA:  72 year old female with confusion, hyperglycemia. Initial encounter.  EXAM: CT HEAD WITHOUT CONTRAST  TECHNIQUE: Contiguous axial images were obtained from the base of the skull through the vertex without intravenous contrast.  COMPARISON:  Brain MRI 07/21/2013 and earlier.  FINDINGS: Visualized paranasal sinuses and mastoids are clear. No acute orbit or scalp soft tissue finding. No acute osseous abnormality identified.  Calcified atherosclerosis at the skull base. Stable cerebral volume. No ventriculomegaly. No midline shift, mass effect, or evidence of intracranial mass lesion. No acute intracranial hemorrhage identified. Stable  periventricular white matter and deep gray matter scattered hypodensity. No evidence of cortically based acute infarction identified. No suspicious intracranial vascular hyperdensity.  IMPRESSION: No acute intracranial abnormality. Stable chronic small vessel disease.   Electronically Signed   By: Augusto Gamble M.D.   On: 10/10/2013 16:35     Assessment/Plan Principal Problem:   UTI (lower urinary tract infection): IV Rocephin. Await cultures. Active Problems:   DEPRESSION: Continue Cymbalta.    Altered mental status: Patient may have some very mild dementia and underlying anxiety with acute delirium brought on by UTI and/or hyperglycemia. We'll correct both and see how patient responds    Protein-calorie malnutrition, severe    Type 2 diabetes mellitus with hyperosmolar nonketotic hyperglycemia: Not in DKA. Resume home dose of Lantus plus sliding-scale. Sugar is likely elevated secondary to urinary tract infection.    Code Status: DO NOT RESUSCITATE  Family Communication: Left message with daughter.  Disposition Plan: Home possibly tomorrow  Time spent: 35 minutes  Hollice Espy [facility] Pager [phone]
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
## 2013-10-11 ENCOUNTER — Encounter (HOSPITAL_COMMUNITY): Payer: Self-pay | Admitting: Emergency Medicine

## 2013-10-11 DIAGNOSIS — G9340 Encephalopathy, unspecified: Secondary | ICD-10-CM

## 2013-10-11 DIAGNOSIS — R443 Hallucinations, unspecified: Secondary | ICD-10-CM

## 2013-10-11 DIAGNOSIS — N179 Acute kidney failure, unspecified: Secondary | ICD-10-CM

## 2013-10-11 DIAGNOSIS — I6350 Cerebral infarction due to unspecified occlusion or stenosis of unspecified cerebral artery: Secondary | ICD-10-CM

## 2013-10-11 LAB — GLUCOSE, CAPILLARY: Glucose-Capillary: 283 mg/dL — ABNORMAL HIGH (ref 70–99)

## 2013-10-11 LAB — BASIC METABOLIC PANEL
Chloride: 100 mEq/L (ref 96–112)
Creatinine, Ser: 0.82 mg/dL (ref 0.50–1.10)
GFR calc Af Amer: 82 mL/min — ABNORMAL LOW (ref >90)
GFR calc non Af Amer: 71 mL/min — ABNORMAL LOW (ref >90)
Potassium: 3.4 mEq/L — ABNORMAL LOW (ref 3.7–5.3)

## 2013-10-11 LAB — CBC
HCT: 36 % (ref 36.0–46.0)
Hemoglobin: 12 g/dL (ref 12.0–15.0)
MCH: 29.3 pg (ref 26.0–34.0)
MCHC: 33.3 g/dL (ref 30.0–36.0)
Platelets: 193 10*3/uL (ref 150–400)
RDW: 13.3 % (ref 11.5–15.5)
WBC: 6.8 10*3/uL (ref 4.0–10.5)

## 2013-10-11 MED ORDER — ATORVASTATIN CALCIUM 20 MG PO TABS
20.0000 mg | ORAL_TABLET | Freq: Every day | ORAL | Status: DC
  Administered 2013-10-11: 20 mg via ORAL
  Filled 2013-10-11 (×2): qty 1

## 2013-10-11 MED ORDER — INSULIN DETEMIR 100 UNIT/ML ~~LOC~~ SOLN
27.0000 [IU] | Freq: Two times a day (BID) | SUBCUTANEOUS | Status: DC
  Administered 2013-10-12: 27 [IU] via SUBCUTANEOUS
  Filled 2013-10-11 (×2): qty 0.27

## 2013-10-11 MED ORDER — INSULIN DETEMIR 100 UNIT/ML ~~LOC~~ SOLN
7.0000 [IU] | Freq: Once | SUBCUTANEOUS | Status: AC
  Administered 2013-10-11: 7 [IU] via SUBCUTANEOUS
  Filled 2013-10-11: qty 0.07

## 2013-10-11 NOTE — ED Notes (Signed)
Bed: WA26 Expected date:  Expected time:  Means of arrival:  Comments: Hold for room 14 

## 2013-10-11 NOTE — ED Notes (Signed)
Patient given tray again and reheated food. Patient did attempt to sit on the side of the bed and is eating.

## 2013-10-11 NOTE — Progress Notes (Signed)
UR completed [facility] ED CM spoke with Dr Virginia Rochester (see [facility] notes)

## 2013-10-11 NOTE — Progress Notes (Signed)
[facility] PROGRESS NOTE  Regina [name] ZOX:[id number] DOB: 06-09-1941 DOA: 10/10/2013 PCP: Tomi Bamberger, NP  Assessment/Plan:  HHNS -Resolving, however patient's CBG still running high -Increase Levemir to 27 units BID, continue resistant SSI  Altered mental status -Patient now back to baseline negative hallucinations/delusions  -Most likely secondary to infection and hyperglycemic state.  UTI -Continue ceftriaxone for Escherichia coli UTI, will switch over to oral agent in the a.m. (awaiting sensitivities)  Hx CVA -Continue home medication   Hypothyroidism -Continue daily    Code Status: DO NOT RESUSCITATE Family Communication: Family present for discussion of plan of care Disposition Plan: Anticipate discharge in a.m.   Consultants:    Procedures:  Antibiotics: Ceftriaxone 12/31    HPI/Subjective: 72 y.o. WF PMHx  hypertension, HLD, diabetes uncontrolled,Hx CVA, depression, anxiety, patient presented to ED by family because of increasing hallucinations and agitation. The last few days, patient has started on her daughter that she been hearing voices and stating that there are people in the house trying to harm her. Patient has a history of anxiety and has been on Cymbalta, dose being increased several days ago  In emergency room patient was noted to have elevated blood sugar 400, not in DKA. She was also noted to have a large urinary tract infection. Rest of her labs were normal including normal white count and hemoglobin. Urine drug screen was negative. CT scan of the head was unremarkable. Patient was given a dose of Rocephin as well as insulin. Hospitalists were called for further evaluation. 10/11/2013 patient is back to baseline cognitive state i.e. A./O. x4, negative hallucinations, negative delusions.   Objective: Filed Vitals:   10/11/13 0943 10/11/13 0957 10/11/13 1158 10/11/13 1258  BP: 133/65 133/65 147/81 166/96  Pulse:  83 82 87  Temp:    98.1 F (36.7 C) 99.3 F (37.4 C)  TempSrc:   Oral Oral  Resp:   20 20  Height:    5\' 2"  (1.575 m)  Weight:    91.309 kg (201 lb 4.8 oz)  SpO2:   95% 99%    Intake/Output Summary (Last 24 hours) at 10/11/13 1809 Last data filed at 10/11/13 1122  Gross per 24 hour  Intake    240 ml  Output      0 ml  Net    240 ml   Filed Weights   10/11/13 1258  Weight: 91.309 kg (201 lb 4.8 oz)    Exam:   General:  A./O. x4, NAD  Cardiovascular: Regular rhythm and rate, grade 4/6 systolic murmur, negative rubs or gallops NOTE Dr. Rennis Golden (cardiologist) aware  Respiratory: Good occupation bilateral  Abdomen: Soft, nontender, nondistended, plus bowel  Musculoskeletal: Negative pedal edema   Data Reviewed: Basic Metabolic Panel:  Recent Labs Lab 10/10/13 1349 10/11/13 0441  NA 134* 138  K 4.3 3.4*  CL 95* 100  CO2 24 27  GLUCOSE 412* 223*  BUN 15 13  CREATININE 0.87 0.82  CALCIUM 8.9 8.7   Liver Function Tests:  Recent Labs Lab 10/10/13 1349  AST 22  ALT 18  ALKPHOS 74  BILITOT 0.5  PROT 6.5  ALBUMIN 3.3*   No results found for this basename: LIPASE, AMYLASE,  in the last 168 hours No results found for this basename: AMMONIA,  in the last 168 hours CBC:  Recent Labs Lab 10/10/13 1349 10/11/13 0441  WBC 7.0 6.8  HGB 12.6 12.0  HCT 37.3 36.0  MCV 86.9 88.0  PLT 226 193  Cardiac Enzymes: No results found for this basename: CKTOTAL, CKMB, CKMBINDEX, TROPONINI,  in the last 168 hours BNP (last 3 results) No results found for this basename: PROBNP,  in the last 8760 hours CBG:  Recent Labs Lab 10/10/13 1818 10/10/13 2108 10/11/13 0757 10/11/13 1204 10/11/13 1708  GLUCAP 257* 231* 271* 306* 283*    Recent Results (from the past 240 hour(s))  URINE CULTURE     Status: None   Collection Time    10/10/13  3:19 PM      Result Value Range Status   Specimen Description URINE, CLEAN CATCH   Final   Special Requests NONE   Final   Culture  Setup Time      Final   Value: 10/10/2013 21:35     Performed at [facility] Count     Final   Value: >=100,000 COLONIES/ML     Performed at [facility]   Culture     Final   Value: ESCHERICHIA COLI     Performed at [facility]   Report Status PENDING   Incomplete     Studies: Ct Head Wo Contrast  10/10/2013   CLINICAL DATA:  72 year old female with confusion, hyperglycemia. Initial encounter.  EXAM: CT HEAD WITHOUT CONTRAST  TECHNIQUE: Contiguous axial images were obtained from the base of the skull through the vertex without intravenous contrast.  COMPARISON:  Brain MRI 07/21/2013 and earlier.  FINDINGS: Visualized paranasal sinuses and mastoids are clear. No acute orbit or scalp soft tissue finding. No acute osseous abnormality identified.  Calcified atherosclerosis at the skull base. Stable cerebral volume. No ventriculomegaly. No midline shift, mass effect, or evidence of intracranial mass lesion. No acute intracranial hemorrhage identified. Stable periventricular white matter and deep gray matter scattered hypodensity. No evidence of cortically based acute infarction identified. No suspicious intracranial vascular hyperdensity.  IMPRESSION: No acute intracranial abnormality. Stable chronic small vessel disease.   Electronically Signed   By: Augusto Gamble M.D.   On: 10/10/2013 16:35    Scheduled Meds: . amLODipine  5 mg Oral Daily  . atorvastatin  20 mg Oral QHS  . carvedilol  12.5 mg Oral Daily  . cefTRIAXone (ROCEPHIN)  IV  1 g Intravenous Q24H  . dipyridamole-aspirin  1 capsule Oral BID  . enoxaparin (LOVENOX) injection  40 mg Subcutaneous Q24H  . ferrous fumarate  2 tablet Oral Daily  . insulin aspart  0-20 Units Subcutaneous TID WC  . insulin aspart  0-5 Units Subcutaneous QHS  . insulin detemir  20 Units Subcutaneous BID  . latanoprost  1 drop Both Eyes QHS  . levothyroxine  75 mcg Oral QAC breakfast   Continuous Infusions:   Principal Problem:   UTI  (lower urinary tract infection) Active Problems:   DEPRESSION   Altered mental status   Protein-calorie malnutrition, severe   Hyperglycemia   Type 2 diabetes mellitus with hyperosmolar nonketotic hyperglycemia    Time spent: 45 minutes   WOODS, CURTIS, J  [facility] Pager [phone]. If 7PM-7AM, please contact night-coverage at [email or web address], password [place] 10/11/2013, 6:09 PM  LOS: 1 day

## 2013-10-12 DIAGNOSIS — I5030 Unspecified diastolic (congestive) heart failure: Secondary | ICD-10-CM

## 2013-10-12 DIAGNOSIS — I5032 Chronic diastolic (congestive) heart failure: Secondary | ICD-10-CM | POA: Diagnosis present

## 2013-10-12 DIAGNOSIS — I272 Pulmonary hypertension, unspecified: Secondary | ICD-10-CM | POA: Diagnosis present

## 2013-10-12 DIAGNOSIS — I2789 Other specified pulmonary heart diseases: Secondary | ICD-10-CM

## 2013-10-12 DIAGNOSIS — I509 Heart failure, unspecified: Secondary | ICD-10-CM

## 2013-10-12 LAB — URINE CULTURE: Colony Count: >=100000

## 2013-10-12 LAB — CBC WITH DIFFERENTIAL/PLATELET
Basophils Absolute: 0 10*3/uL (ref 0.0–0.1)
Basophils Relative: 0 % (ref 0–1)
Eosinophils Absolute: 0 10*3/uL (ref 0.0–0.7)
Eosinophils Relative: 0 % (ref 0–5)
HCT: 37.6 % (ref 36.0–46.0)
Hemoglobin: 12.6 g/dL (ref 12.0–15.0)
Lymphocytes Relative: 35 % (ref 12–46)
Lymphs Abs: 2.3 10*3/uL (ref 0.7–4.0)
MCH: 29.4 pg (ref 26.0–34.0)
MCHC: 33.5 g/dL (ref 30.0–36.0)
MCV: 87.9 fL (ref 78.0–100.0)
Monocytes Absolute: 0.9 10*3/uL (ref 0.1–1.0)
Monocytes Relative: 13 % — ABNORMAL HIGH (ref 3–12)
Neutro Abs: 3.5 10*3/uL (ref 1.7–7.7)
Neutrophils Relative %: 52 % (ref 43–77)
Platelets: 210 10*3/uL (ref 150–400)
RBC: 4.28 MIL/uL (ref 3.87–5.11)
RDW: 13.4 % (ref 11.5–15.5)
WBC: 6.7 10*3/uL (ref 4.0–10.5)

## 2013-10-12 LAB — COMPREHENSIVE METABOLIC PANEL
ALT: 15 U/L (ref 0–35)
AST: 17 U/L (ref 0–37)
Albumin: 3.1 g/dL — ABNORMAL LOW (ref 3.5–5.2)
Alkaline Phosphatase: 70 U/L (ref 39–117)
BUN: 9 mg/dL (ref 6–23)
CO2: 28 mEq/L (ref 19–32)
Calcium: 9.7 mg/dL (ref 8.4–10.5)
Chloride: 98 mEq/L (ref 96–112)
Creatinine, Ser: 0.87 mg/dL (ref 0.50–1.10)
GFR calc Af Amer: 76 mL/min — ABNORMAL LOW (ref >90)
GFR calc non Af Amer: 66 mL/min — ABNORMAL LOW (ref >90)
Glucose, Bld: 222 mg/dL — ABNORMAL HIGH (ref 70–99)
Potassium: 3.3 mEq/L — ABNORMAL LOW (ref 3.7–5.3)
Sodium: 138 mEq/L (ref 137–147)
Total Bilirubin: 0.2 mg/dL — ABNORMAL LOW (ref 0.3–1.2)
Total Protein: 6.3 g/dL (ref 6.0–8.3)

## 2013-10-12 LAB — GLUCOSE, CAPILLARY
GLUCOSE-CAPILLARY: 184 mg/dL — AB (ref 70–99)
Glucose-Capillary: 194 mg/dL — ABNORMAL HIGH (ref 70–99)
Glucose-Capillary: 313 mg/dL — ABNORMAL HIGH (ref 70–99)

## 2013-10-12 MED ORDER — LISINOPRIL 5 MG PO TABS: 5.0000 mg | ORAL_TABLET | Freq: Every day | ORAL | Status: DC

## 2013-10-12 MED ORDER — LISINOPRIL 5 MG PO TABS
5.0000 mg | ORAL_TABLET | Freq: Every day | ORAL | Status: DC
  Administered 2013-10-12: 5 mg via ORAL
  Filled 2013-10-12: qty 1

## 2013-10-12 MED ORDER — CARVEDILOL 25 MG PO TABS
25.0000 mg | ORAL_TABLET | Freq: Two times a day (BID) | ORAL | Status: DC
  Filled 2013-10-12 (×2): qty 1

## 2013-10-12 MED ORDER — CARVEDILOL 25 MG PO TABS: 25.0000 mg | ORAL_TABLET | Freq: Two times a day (BID) | ORAL | Status: DC

## 2013-10-12 MED ORDER — CEFIXIME 400 MG PO TABS: 400.0000 mg | ORAL_TABLET | Freq: Every day | ORAL | Status: DC

## 2013-10-12 MED ORDER — CARVEDILOL 12.5 MG PO TABS
12.5000 mg | ORAL_TABLET | Freq: Once | ORAL | Status: AC
  Administered 2013-10-12: 12.5 mg via ORAL
  Filled 2013-10-12: qty 1

## 2013-10-12 MED ORDER — CEFIXIME 400 MG PO TABS
400.0000 mg | ORAL_TABLET | Freq: Every day | ORAL | Status: DC
  Administered 2013-10-12: 400 mg via ORAL
  Filled 2013-10-12: qty 1

## 2013-10-12 MED ORDER — CARVEDILOL 12.5 MG PO TABS
12.5000 mg | ORAL_TABLET | Freq: Two times a day (BID) | ORAL | Status: DC
  Administered 2013-10-12: 12.5 mg via ORAL
  Filled 2013-10-12 (×3): qty 1

## 2013-10-12 MED ORDER — INSULIN ASPART 100 UNIT/ML ~~LOC~~ SOLN: 5.0000 [IU] | Freq: Three times a day (TID) | SUBCUTANEOUS | Status: DC

## 2013-10-12 MED ORDER — HYDRALAZINE HCL 20 MG/ML IJ SOLN
5.0000 mg | INTRAMUSCULAR | Status: DC | PRN
  Administered 2013-10-12: 5 mg via INTRAVENOUS
  Filled 2013-10-12: qty 1

## 2013-10-12 MED ORDER — AMLODIPINE BESYLATE 10 MG PO TABS
10.0000 mg | ORAL_TABLET | Freq: Every day | ORAL | Status: DC
  Administered 2013-10-12: 10 mg via ORAL
  Filled 2013-10-12: qty 1

## 2013-10-12 NOTE — Progress Notes (Signed)
Patient d/c to home. Patient legally blind. Instructions given verbally to patient and daughter with discharge packet accepted per daughter. No c/o pain or discomfort. Discharge meds ready to be picked up at pharmacy as relayed per daughter. Patient dressed and ready to go home with belongings packed. To be transported to vehicle per staff member via wheelchair.

## 2013-10-12 NOTE — Discharge Summary (Signed)
Physician Discharge Summary  Regina [name] WUJ:[id number] DOB: 10-10-1941 DOA: 10/10/2013  PCP: Delia Chimes, NP  Admit date: 10/10/2013 Discharge date: 10/12/2013  Time spent: 30minutes  Recommendations for Outpatient Follow-up:   HHNS  -Resolving, however patient's CBG still running high  -Discharge  patient on Levemir to 30 units BID, and NovoLog 5 units q. a.c. patient will need to make an appointment with her PCP within 7 days in order to titrate medication -Counseled patient on how to keep log of FSBS QAC and QHS which she should take to every physician visit. -Counseled on how to keep a food log which she should take to every physician visit   Altered mental status  -Patient now back to baseline negative hallucinations/delusions  -Was Most likely secondary to infection and hyperglycemic state.   UTI  -Change ceftriaxone to Cefixime x 3 days; Escherichia coli UTI, sensitivities are still pending, however clinically patient improved, and negative leukocytosis    Hx CVA  -Continue home medication   Diastolic CHF/pulmonary hypertension -Increase patient's Coreg to 25 mg BID -Start patient on lisinopril 5 mg daily -Patient counseled on medication changes and the need to followup with her PCP within 7-10 days  Hypothyroidism  -Continue 16mcg daily    Discharge Diagnoses:  Principal Problem:   UTI (lower urinary tract infection) Active Problems:   HYPOTHYROIDISM   HYPERLIPIDEMIA   ANEMIA-IRON DEFICIENCY   ANXIETY   DEPRESSION   Altered mental status   CVA (cerebral infarction)   Seizures   Protein-calorie malnutrition, severe   Hyperglycemia   Type 2 diabetes mellitus with hyperosmolar nonketotic hyperglycemia   Discharge Condition: Stable  Diet recommendation: American diabetic Association  Filed Weights   10/11/13 1258  Weight: 91.309 kg (201 lb 4.8 oz)    History of present illness:  73 y.o. WF PMHx hypertension, HLD, diabetes uncontrolled,Hx CVA,  depression, anxiety, patient presented to ED by family because of increasing hallucinations and agitation. The last few days, patient has started on her daughter that she been hearing voices and stating that there are people in the house trying to harm her. Patient has a history of anxiety and has been on Cymbalta, dose being increased several days ago  In emergency room patient was noted to have elevated blood sugar 400, not in DKA. She was also noted to have a large urinary tract infection. Rest of her labs were normal including normal white count and hemoglobin. Urine drug screen was negative. CT scan of the head was unremarkable. Patient was given a dose of Rocephin as well as insulin. Hospitalists were called for further evaluation. 10/11/2013 patient is back to baseline cognitive state i.e. A./O. x4, negative hallucinations, negative delusions.10/12/2013 patient has been treated for her UTI with IV antibiotics, and will be discharged on 3 days of oral antibiotics. Patient's HHNS was also treated during her stay and although her CBGs are not within normal limits are greatly improved. Patient will be sent home on her new insulin regimen to followup with her PCP for further titration within one week.   Procedure Echocardiogram 02/14/2013 - Left ventricle: moderate concentric hypertrophy.  -LVEF= 55% to 60%.  -(grade 1 diastolic dysfunction). Doppler parameters c/w with elevated mean left atrial filling pressure. - Aortic valve: There was moderate stenosis. Valve area: 1.09cm^2(VTI). - Mitral valve: Calcified annulus. Mild regurgitation. Valve area by pressure half-time: 2.24cm^2. Valve area by continuity equation (using LVOT flow): 2.06cm^2. - Left atrium: The atrium was severely dilated. - Pulmonary arteries: PA peak pressure: 43mm  Hg (S).  . Antibiotics:  Ceftriaxone 12/31>> stopped 10/12/2013 Cefixime 10/12/2013>>   Discharge Exam: Filed Vitals:   10/11/13 2130 10/12/13 0629 10/12/13 0950  10/12/13 0952  BP: 180/85 183/101 159/85 159/85  Pulse: 79 87  90  Temp: 98 F (36.7 C) 98 F (36.7 C)    TempSrc: Oral Oral    Resp: 18 18    Height:      Weight:      SpO2: 97% 95%     General: A./O. x4, NAD  Cardiovascular: Regular rhythm and rate, grade 4/6 systolic murmur, negative rubs or gallops NOTE Dr. Rennis Golden (cardiologist) aware  Respiratory: Good occupation bilateral  Abdomen: Soft, nontender, nondistended, plus bowel  Musculoskeletal: Negative pedal edema   Discharge Instructions     Medication List    ASK your doctor about these medications       acidophilus Caps capsule  Take 2 capsules by mouth daily.     amLODipine 10 MG tablet  Commonly known as:  NORVASC  Take 0.5 tablets (5 mg total) by mouth daily.     calcium-vitamin D 500-200 MG-UNIT per tablet  Commonly known as:  OSCAL WITH D  Take 1 tablet by mouth 2 (two) times daily.     carvedilol 12.5 MG tablet  Commonly known as:  COREG  Take 12.5 mg by mouth daily.     dipyridamole-aspirin 200-25 MG per 12 hr capsule  Commonly known as:  AGGRENOX  Take 1 capsule by mouth 2 (two) times daily.     ezetimibe 10 MG tablet  Commonly known as:  ZETIA  Take 10 mg by mouth daily.     ferrous fumarate 325 (106 FE) MG Tabs tablet  Commonly known as:  HEMOCYTE - 106 mg FE  Take 2 tablets by mouth daily.     Fish Oil 1000 MG Caps  Take 2,000 mg by mouth daily.     insulin aspart 100 UNIT/ML injection  Commonly known as:  novoLOG  Inject 10 Units into the skin 3 (three) times daily with meals. If blood sugar > 200.     insulin detemir 100 UNIT/ML injection  Commonly known as:  LEVEMIR  Inject 0.3 mLs (30 Units total) into the skin 2 (two) times daily.     levothyroxine 75 MCG tablet  Commonly known as:  SYNTHROID, LEVOTHROID  Take 1 tablet (75 mcg total) by mouth daily before breakfast.     multivitamin with minerals Tabs tablet  Take 1 tablet by mouth daily.     ranitidine 75 MG tablet   Commonly known as:  ZANTAC  Take 75 mg by mouth 2 (two) times daily.     simvastatin 40 MG tablet  Commonly known as:  ZOCOR  Take 40 mg by mouth at bedtime.     Travoprost (BAK Free) 0.004 % Soln ophthalmic solution  Commonly known as:  TRAVATAN  Place 1 drop into both eyes at bedtime.       Allergies  Allergen Reactions  . Amoxicillin     REACTION: rash  . Ciprofloxacin     REACTION: hives  . Codeine     REACTION: nervous, shaky  . Duloxetine     REACTION: reaction not known  . Hydrochlorothiazide W-Triamterene     REACTION: hyponatremia  . Ticlopidine Hcl     REACTION: bleeding      The results of significant diagnostics from this hospitalization (including imaging, microbiology, ancillary and laboratory) are listed below for reference.  Significant Diagnostic Studies: Ct Head Wo Contrast  10/10/2013   CLINICAL DATA:  73 year old female with confusion, hyperglycemia. Initial encounter.  EXAM: CT HEAD WITHOUT CONTRAST  TECHNIQUE: Contiguous axial images were obtained from the base of the skull through the vertex without intravenous contrast.  COMPARISON:  Brain MRI 07/21/2013 and earlier.  FINDINGS: Visualized paranasal sinuses and mastoids are clear. No acute orbit or scalp soft tissue finding. No acute osseous abnormality identified.  Calcified atherosclerosis at the skull base. Stable cerebral volume. No ventriculomegaly. No midline shift, mass effect, or evidence of intracranial mass lesion. No acute intracranial hemorrhage identified. Stable periventricular white matter and deep gray matter scattered hypodensity. No evidence of cortically based acute infarction identified. No suspicious intracranial vascular hyperdensity.  IMPRESSION: No acute intracranial abnormality. Stable chronic small vessel disease.   Electronically Signed   By: Lars Pinks M.D.   On: 10/10/2013 16:35    Microbiology: Recent Results (from the past 240 hour(s))  URINE CULTURE     Status: None    Collection Time    10/10/13  3:19 PM      Result Value Range Status   Specimen Description URINE, CLEAN CATCH   Final   Special Requests NONE   Final   Culture  Setup Time     Final   Value: 10/10/2013 21:35     Performed at [facility]     Final   Value: >=100,000 COLONIES/ML     Performed at [facility]   Culture     Final   Value: ESCHERICHIA COLI     Performed at [facility]   Report Status PENDING   Incomplete     Labs: Basic Metabolic Panel:  Recent Labs Lab 10/10/13 1349 10/11/13 0441 10/11/13 1830 10/12/13 0537  NA 134* 138  --  138  K 4.3 3.4*  --  3.3*  CL 95* 100  --  98  CO2 24 27  --  28  GLUCOSE 412* 223*  --  222*  BUN 15 13  --  9  CREATININE 0.87 0.82  --  0.87  CALCIUM 8.9 8.7  --  9.7  MG  --   --  1.6  --    Liver Function Tests:  Recent Labs Lab 10/10/13 1349 10/12/13 0537  AST 22 17  ALT 18 15  ALKPHOS 74 70  BILITOT 0.5 0.2*  PROT 6.5 6.3  ALBUMIN 3.3* 3.1*   No results found for this basename: LIPASE, AMYLASE,  in the last 168 hours No results found for this basename: AMMONIA,  in the last 168 hours CBC:  Recent Labs Lab 10/10/13 1349 10/11/13 0441 10/12/13 0537  WBC 7.0 6.8 6.7  NEUTROABS  --   --  3.5  HGB 12.6 12.0 12.6  HCT 37.3 36.0 37.6  MCV 86.9 88.0 87.9  PLT 226 193 210   Cardiac Enzymes: No results found for this basename: CKTOTAL, CKMB, CKMBINDEX, TROPONINI,  in the last 168 hours BNP: BNP (last 3 results) No results found for this basename: PROBNP,  in the last 8760 hours CBG:  Recent Labs Lab 10/11/13 1204 10/11/13 1708 10/11/13 2138 10/12/13 0810 10/12/13 0948  GLUCAP 306* 283* 394* 194* 184*       Signed:  Dia Crawford, MD [facility] [phone] pager

## 2013-10-16 ENCOUNTER — Telehealth: Payer: Self-pay | Admitting: Internal Medicine

## 2013-10-16 NOTE — Telephone Encounter (Signed)
Message forwarded to J. Elkins, RN.  

## 2013-10-16 NOTE — Telephone Encounter (Signed)
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

## 2013-10-17 NOTE — Telephone Encounter (Signed)
Need v/o for [facility] re-certifcation

## 2013-10-18 NOTE — Telephone Encounter (Signed)
Ok for [facility] evaluation.  -Dr. Dorthea Cove

## 2013-10-18 NOTE — Telephone Encounter (Signed)
Called Jenny Reichmann - gave V/O for [facility] recert anf provided fax number

## 2013-10-26 ENCOUNTER — Telehealth: Payer: Self-pay | Admitting: *Deleted

## 2013-10-26 ENCOUNTER — Ambulatory Visit: Payer: Medicare Other | Admitting: Family Medicine

## 2013-10-26 NOTE — Telephone Encounter (Signed)
Faxed signed ordered for ROC/Recert for SN, PT, HHA and Eval & Treat (PT) to [employer] on 10/25/13  Also mailed copy of signed orders in provided postage-paid envelope.

## 2013-11-26 ENCOUNTER — Emergency Department (HOSPITAL_COMMUNITY): Payer: Medicare Other

## 2013-11-26 ENCOUNTER — Encounter (HOSPITAL_COMMUNITY): Payer: Self-pay | Admitting: Emergency Medicine

## 2013-11-26 ENCOUNTER — Inpatient Hospital Stay (HOSPITAL_COMMUNITY)
Admission: EM | Admit: 2013-11-26 | Discharge: 2013-11-29 | DRG: 682 | Disposition: A | Discharge status: Skilled Nursing Facility | Payer: Medicare Other | Attending: Internal Medicine | Admitting: Internal Medicine

## 2013-11-26 DIAGNOSIS — G9340 Encephalopathy, unspecified: Secondary | ICD-10-CM

## 2013-11-26 DIAGNOSIS — Z9851 Tubal ligation status: Secondary | ICD-10-CM

## 2013-11-26 DIAGNOSIS — K219 Gastro-esophageal reflux disease without esophagitis: Secondary | ICD-10-CM | POA: Diagnosis present

## 2013-11-26 DIAGNOSIS — R531 Weakness: Secondary | ICD-10-CM

## 2013-11-26 DIAGNOSIS — Z79899 Other long term (current) drug therapy: Secondary | ICD-10-CM

## 2013-11-26 DIAGNOSIS — M129 Arthropathy, unspecified: Secondary | ICD-10-CM | POA: Diagnosis present

## 2013-11-26 DIAGNOSIS — E870 Hyperosmolality and hypernatremia: Secondary | ICD-10-CM

## 2013-11-26 DIAGNOSIS — R011 Cardiac murmur, unspecified: Secondary | ICD-10-CM | POA: Diagnosis present

## 2013-11-26 DIAGNOSIS — F419 Anxiety disorder, unspecified: Secondary | ICD-10-CM

## 2013-11-26 DIAGNOSIS — I350 Nonrheumatic aortic (valve) stenosis: Secondary | ICD-10-CM

## 2013-11-26 DIAGNOSIS — E1100 Type 2 diabetes mellitus with hyperosmolarity without nonketotic hyperglycemic-hyperosmolar coma (NKHHC): Secondary | ICD-10-CM

## 2013-11-26 DIAGNOSIS — R7309 Other abnormal glucose: Secondary | ICD-10-CM

## 2013-11-26 DIAGNOSIS — E1165 Type 2 diabetes mellitus with hyperglycemia: Secondary | ICD-10-CM

## 2013-11-26 DIAGNOSIS — E440 Moderate protein-calorie malnutrition: Secondary | ICD-10-CM | POA: Diagnosis present

## 2013-11-26 DIAGNOSIS — N179 Acute kidney failure, unspecified: Secondary | ICD-10-CM

## 2013-11-26 DIAGNOSIS — IMO0002 Reserved for concepts with insufficient information to code with codable children: Secondary | ICD-10-CM

## 2013-11-26 DIAGNOSIS — I359 Nonrheumatic aortic valve disorder, unspecified: Secondary | ICD-10-CM | POA: Diagnosis present

## 2013-11-26 DIAGNOSIS — R569 Unspecified convulsions: Secondary | ICD-10-CM

## 2013-11-26 DIAGNOSIS — A0472 Enterocolitis due to Clostridium difficile, not specified as recurrent: Secondary | ICD-10-CM

## 2013-11-26 DIAGNOSIS — Z881 Allergy status to other antibiotic agents status: Secondary | ICD-10-CM

## 2013-11-26 DIAGNOSIS — Z8249 Family history of ischemic heart disease and other diseases of the circulatory system: Secondary | ICD-10-CM

## 2013-11-26 DIAGNOSIS — E039 Hypothyroidism, unspecified: Secondary | ICD-10-CM

## 2013-11-26 DIAGNOSIS — E872 Acidosis, unspecified: Secondary | ICD-10-CM

## 2013-11-26 DIAGNOSIS — Z66 Do not resuscitate: Secondary | ICD-10-CM | POA: Diagnosis not present

## 2013-11-26 DIAGNOSIS — D509 Iron deficiency anemia, unspecified: Secondary | ICD-10-CM

## 2013-11-26 DIAGNOSIS — D649 Anemia, unspecified: Secondary | ICD-10-CM | POA: Diagnosis present

## 2013-11-26 DIAGNOSIS — D696 Thrombocytopenia, unspecified: Secondary | ICD-10-CM

## 2013-11-26 DIAGNOSIS — Z833 Family history of diabetes mellitus: Secondary | ICD-10-CM

## 2013-11-26 DIAGNOSIS — Z888 Allergy status to other drugs, medicaments and biological substances status: Secondary | ICD-10-CM

## 2013-11-26 DIAGNOSIS — Z8544 Personal history of malignant neoplasm of other female genital organs: Secondary | ICD-10-CM

## 2013-11-26 DIAGNOSIS — E43 Unspecified severe protein-calorie malnutrition: Secondary | ICD-10-CM

## 2013-11-26 DIAGNOSIS — E860 Dehydration: Secondary | ICD-10-CM

## 2013-11-26 DIAGNOSIS — H548 Legal blindness, as defined in USA: Secondary | ICD-10-CM | POA: Diagnosis present

## 2013-11-26 DIAGNOSIS — E871 Hypo-osmolality and hyponatremia: Secondary | ICD-10-CM | POA: Diagnosis present

## 2013-11-26 DIAGNOSIS — E873 Alkalosis: Secondary | ICD-10-CM | POA: Diagnosis present

## 2013-11-26 DIAGNOSIS — Z87891 Personal history of nicotine dependence: Secondary | ICD-10-CM

## 2013-11-26 DIAGNOSIS — E0590 Thyrotoxicosis, unspecified without thyrotoxic crisis or storm: Secondary | ICD-10-CM

## 2013-11-26 DIAGNOSIS — I5030 Unspecified diastolic (congestive) heart failure: Secondary | ICD-10-CM

## 2013-11-26 DIAGNOSIS — R4182 Altered mental status, unspecified: Secondary | ICD-10-CM | POA: Diagnosis present

## 2013-11-26 DIAGNOSIS — I639 Cerebral infarction, unspecified: Secondary | ICD-10-CM

## 2013-11-26 DIAGNOSIS — I10 Essential (primary) hypertension: Secondary | ICD-10-CM

## 2013-11-26 DIAGNOSIS — G039 Meningitis, unspecified: Secondary | ICD-10-CM

## 2013-11-26 DIAGNOSIS — N390 Urinary tract infection, site not specified: Secondary | ICD-10-CM

## 2013-11-26 DIAGNOSIS — Z602 Problems related to living alone: Secondary | ICD-10-CM

## 2013-11-26 DIAGNOSIS — Z794 Long term (current) use of insulin: Secondary | ICD-10-CM

## 2013-11-26 DIAGNOSIS — F411 Generalized anxiety disorder: Secondary | ICD-10-CM

## 2013-11-26 DIAGNOSIS — E1110 Type 2 diabetes mellitus with ketoacidosis without coma: Secondary | ICD-10-CM

## 2013-11-26 DIAGNOSIS — Z8679 Personal history of other diseases of the circulatory system: Secondary | ICD-10-CM

## 2013-11-26 DIAGNOSIS — Z8673 Personal history of transient ischemic attack (TIA), and cerebral infarction without residual deficits: Secondary | ICD-10-CM

## 2013-11-26 DIAGNOSIS — R739 Hyperglycemia, unspecified: Secondary | ICD-10-CM

## 2013-11-26 DIAGNOSIS — R197 Diarrhea, unspecified: Secondary | ICD-10-CM

## 2013-11-26 DIAGNOSIS — E1139 Type 2 diabetes mellitus with other diabetic ophthalmic complication: Secondary | ICD-10-CM | POA: Diagnosis present

## 2013-11-26 DIAGNOSIS — F329 Major depressive disorder, single episode, unspecified: Secondary | ICD-10-CM | POA: Diagnosis present

## 2013-11-26 DIAGNOSIS — I272 Pulmonary hypertension, unspecified: Secondary | ICD-10-CM

## 2013-11-26 DIAGNOSIS — G9349 Other encephalopathy: Secondary | ICD-10-CM | POA: Diagnosis present

## 2013-11-26 DIAGNOSIS — R413 Other amnesia: Secondary | ICD-10-CM

## 2013-11-26 DIAGNOSIS — F0390 Unspecified dementia without behavioral disturbance: Secondary | ICD-10-CM | POA: Diagnosis present

## 2013-11-26 DIAGNOSIS — E785 Hyperlipidemia, unspecified: Secondary | ICD-10-CM

## 2013-11-26 DIAGNOSIS — F3289 Other specified depressive episodes: Secondary | ICD-10-CM | POA: Diagnosis present

## 2013-11-26 LAB — URINALYSIS, ROUTINE W REFLEX MICROSCOPIC
Bilirubin Urine: NEGATIVE
Hgb urine dipstick: NEGATIVE
Ketones, ur: 15 mg/dL — AB
LEUKOCYTES UA: NEGATIVE
Nitrite: NEGATIVE
PH: 5.5 (ref 5.0–8.0)
Protein, ur: NEGATIVE mg/dL
Specific Gravity, Urine: 1.022 (ref 1.005–1.030)
Urobilinogen, UA: 0.2 mg/dL (ref 0.0–1.0)

## 2013-11-26 LAB — COMPREHENSIVE METABOLIC PANEL
ALBUMIN: 3.6 g/dL (ref 3.5–5.2)
ALT: 53 U/L — ABNORMAL HIGH (ref 0–35)
AST: 49 U/L — ABNORMAL HIGH (ref 0–37)
Alkaline Phosphatase: 70 U/L (ref 39–117)
BUN: 23 mg/dL (ref 6–23)
CALCIUM: 9.5 mg/dL (ref 8.4–10.5)
CO2: 25 mEq/L (ref 19–32)
Chloride: 93 mEq/L — ABNORMAL LOW (ref 96–112)
Creatinine, Ser: 1.27 mg/dL — ABNORMAL HIGH (ref 0.50–1.10)
GFR calc non Af Amer: 41 mL/min — ABNORMAL LOW (ref >90)
GFR, EST AFRICAN AMERICAN: 48 mL/min — AB (ref >90)
Glucose, Bld: 410 mg/dL — ABNORMAL HIGH (ref 70–99)
Potassium: 4.2 mEq/L (ref 3.7–5.3)
Sodium: 133 mEq/L — ABNORMAL LOW (ref 137–147)
TOTAL PROTEIN: 6.9 g/dL (ref 6.0–8.3)
Total Bilirubin: 0.5 mg/dL (ref 0.3–1.2)

## 2013-11-26 LAB — CBC WITH DIFFERENTIAL/PLATELET
Basophils Absolute: 0 10*3/uL (ref 0.0–0.1)
Basophils Relative: 0 % (ref 0–1)
EOS ABS: 0 10*3/uL (ref 0.0–0.7)
EOS PCT: 0 % (ref 0–5)
HCT: 37.3 % (ref 36.0–46.0)
Hemoglobin: 12.8 g/dL (ref 12.0–15.0)
Lymphocytes Relative: 20 % (ref 12–46)
Lymphs Abs: 1.7 10*3/uL (ref 0.7–4.0)
MCH: 29.5 pg (ref 26.0–34.0)
MCHC: 34.3 g/dL (ref 30.0–36.0)
MCV: 85.9 fL (ref 78.0–100.0)
Monocytes Absolute: 0.8 10*3/uL (ref 0.1–1.0)
Monocytes Relative: 9 % (ref 3–12)
Neutro Abs: 5.9 10*3/uL (ref 1.7–7.7)
Neutrophils Relative %: 70 % (ref 43–77)
PLATELETS: 149 10*3/uL — AB (ref 150–400)
RBC: 4.34 MIL/uL (ref 3.87–5.11)
RDW: 13.1 % (ref 11.5–15.5)
WBC: 8.3 10*3/uL (ref 4.0–10.5)

## 2013-11-26 LAB — POCT I-STAT 3, VENOUS BLOOD GAS (G3P V)
Acid-Base Excess: 2 mmol/L (ref 0.0–2.0)
Bicarbonate: 27.4 mEq/L — ABNORMAL HIGH (ref 20.0–24.0)
O2 SAT: 96 %
TCO2: 29 mmol/L (ref 0–100)
pCO2, Ven: 43 mmHg — ABNORMAL LOW (ref 45.0–50.0)
pH, Ven: 7.412 — ABNORMAL HIGH (ref 7.250–7.300)
pO2, Ven: 79 mmHg — ABNORMAL HIGH (ref 30.0–45.0)

## 2013-11-26 LAB — KETONES, QUALITATIVE: Acetone, Bld: NEGATIVE

## 2013-11-26 LAB — GLUCOSE, CAPILLARY
GLUCOSE-CAPILLARY: 442 mg/dL — AB (ref 70–99)
GLUCOSE-CAPILLARY: 263 mg/dL — AB (ref 70–99)
GLUCOSE-CAPILLARY: 228 mg/dL — AB (ref 70–99)
Glucose-Capillary: 328 mg/dL — ABNORMAL HIGH (ref 70–99)
Glucose-Capillary: 240 mg/dL — ABNORMAL HIGH (ref 70–99)

## 2013-11-26 LAB — CG4 I-STAT (LACTIC ACID): Lactic Acid, Venous: 1.52 mmol/L (ref 0.5–2.2)

## 2013-11-26 LAB — URINE MICROSCOPIC-ADD ON

## 2013-11-26 LAB — POCT I-STAT TROPONIN I: TROPONIN I, POC: 0.02 ng/mL (ref 0.00–0.08)

## 2013-11-26 MED ORDER — ASPIRIN-DIPYRIDAMOLE ER 25-200 MG PO CP12
1.0000 | ORAL_CAPSULE | Freq: Two times a day (BID) | ORAL | Status: DC
  Administered 2013-11-26 – 2013-11-29 (×6): 1 via ORAL
  Filled 2013-11-26 (×7): qty 1

## 2013-11-26 MED ORDER — ONDANSETRON HCL 4 MG PO TABS: 4.0000 mg | ORAL_TABLET | Freq: Four times a day (QID) | ORAL | Status: DC | PRN

## 2013-11-26 MED ORDER — CARVEDILOL 25 MG PO TABS
25.0000 mg | ORAL_TABLET | Freq: Two times a day (BID) | ORAL | Status: DC
  Administered 2013-11-27 – 2013-11-29 (×6): 25 mg via ORAL
  Filled 2013-11-26 (×8): qty 1

## 2013-11-26 MED ORDER — SODIUM CHLORIDE 0.9 % IV BOLUS (SEPSIS)
1000.0000 mL | Freq: Once | INTRAVENOUS | Status: AC
  Administered 2013-11-26: 1000 mL via INTRAVENOUS

## 2013-11-26 MED ORDER — DULOXETINE HCL 60 MG PO CPEP
60.0000 mg | ORAL_CAPSULE | Freq: Every day | ORAL | Status: DC
  Administered 2013-11-26 – 2013-11-29 (×4): 60 mg via ORAL
  Filled 2013-11-26 (×4): qty 1

## 2013-11-26 MED ORDER — SODIUM CHLORIDE 0.9 % IV SOLN
INTRAVENOUS | Status: DC
  Administered 2013-11-26 – 2013-11-27 (×4): via INTRAVENOUS

## 2013-11-26 MED ORDER — ADULT MULTIVITAMIN W/MINERALS CH: 1.0000 | ORAL_TABLET | Freq: Every day | ORAL | Status: DC

## 2013-11-26 MED ORDER — MEMANTINE HCL 5 MG PO TABS
5.0000 mg | ORAL_TABLET | Freq: Every day | ORAL | Status: DC
  Administered 2013-11-26 – 2013-11-29 (×4): 5 mg via ORAL
  Filled 2013-11-26 (×4): qty 1

## 2013-11-26 MED ORDER — INSULIN DETEMIR 100 UNIT/ML ~~LOC~~ SOLN
35.0000 [IU] | Freq: Two times a day (BID) | SUBCUTANEOUS | Status: DC
  Administered 2013-11-26 – 2013-11-29 (×6): 35 [IU] via SUBCUTANEOUS
  Filled 2013-11-26 (×7): qty 0.35

## 2013-11-26 MED ORDER — HEPARIN SODIUM (PORCINE) 5000 UNIT/ML IJ SOLN
5000.0000 [IU] | Freq: Three times a day (TID) | INTRAMUSCULAR | Status: DC
  Administered 2013-11-26 – 2013-11-29 (×9): 5000 [IU] via SUBCUTANEOUS
  Filled 2013-11-26 (×11): qty 1

## 2013-11-26 MED ORDER — LEVOTHYROXINE SODIUM 75 MCG PO TABS
75.0000 ug | ORAL_TABLET | Freq: Every day | ORAL | Status: DC
  Administered 2013-11-27 – 2013-11-29 (×3): 75 ug via ORAL
  Filled 2013-11-26 (×5): qty 1

## 2013-11-26 MED ORDER — SODIUM CHLORIDE 0.9 % IV SOLN
INTRAVENOUS | Status: AC
  Administered 2013-11-26: 19:00:00 via INTRAVENOUS

## 2013-11-26 MED ORDER — CALCIUM CARBONATE-VITAMIN D 500-200 MG-UNIT PO TABS
1.0000 | ORAL_TABLET | Freq: Two times a day (BID) | ORAL | Status: DC
  Administered 2013-11-26 – 2013-11-29 (×6): 1 via ORAL
  Filled 2013-11-26 (×7): qty 1

## 2013-11-26 MED ORDER — SIMVASTATIN 40 MG PO TABS
40.0000 mg | ORAL_TABLET | Freq: Every day | ORAL | Status: DC
  Administered 2013-11-26 – 2013-11-27 (×2): 40 mg via ORAL
  Filled 2013-11-26 (×3): qty 1

## 2013-11-26 MED ORDER — FERROUS FUMARATE 325 (106 FE) MG PO TABS
2.0000 | ORAL_TABLET | Freq: Every day | ORAL | Status: DC
  Administered 2013-11-27 – 2013-11-29 (×3): 212 mg via ORAL
  Filled 2013-11-26 (×3): qty 2

## 2013-11-26 MED ORDER — INSULIN ASPART 100 UNIT/ML ~~LOC~~ SOLN
5.0000 [IU] | Freq: Three times a day (TID) | SUBCUTANEOUS | Status: DC
  Administered 2013-11-27 – 2013-11-29 (×9): 5 [IU] via SUBCUTANEOUS

## 2013-11-26 MED ORDER — EZETIMIBE 10 MG PO TABS
10.0000 mg | ORAL_TABLET | Freq: Every day | ORAL | Status: DC
  Administered 2013-11-26 – 2013-11-29 (×4): 10 mg via ORAL
  Filled 2013-11-26 (×4): qty 1

## 2013-11-26 MED ORDER — ACETAMINOPHEN 650 MG RE SUPP
650.0000 mg | Freq: Four times a day (QID) | RECTAL | Status: DC | PRN
Start: 2013-11-26 — End: 2013-11-29

## 2013-11-26 MED ORDER — ACETAMINOPHEN 325 MG PO TABS: 650.0000 mg | ORAL_TABLET | Freq: Four times a day (QID) | ORAL | Status: DC | PRN

## 2013-11-26 MED ORDER — MEMANTINE HCL ER 7 MG PO CP24: 7.0000 mg | ORAL_CAPSULE | Freq: Every day | ORAL | Status: DC

## 2013-11-26 MED ORDER — HYDRALAZINE HCL 20 MG/ML IJ SOLN: 5.0000 mg | INTRAMUSCULAR | Status: DC | PRN

## 2013-11-26 MED ORDER — ONDANSETRON HCL 4 MG/2ML IJ SOLN: 4.0000 mg | Freq: Three times a day (TID) | INTRAMUSCULAR | Status: DC | PRN

## 2013-11-26 MED ORDER — ADULT MULTIVITAMIN W/MINERALS CH
1.0000 | ORAL_TABLET | Freq: Every day | ORAL | Status: DC
  Administered 2013-11-27 – 2013-11-29 (×3): 1 via ORAL
  Filled 2013-11-26 (×3): qty 1

## 2013-11-26 MED ORDER — ONDANSETRON HCL 4 MG/2ML IJ SOLN: 4.0000 mg | Freq: Four times a day (QID) | INTRAMUSCULAR | Status: DC | PRN

## 2013-11-26 MED ORDER — RISAQUAD PO CAPS
2.0000 | ORAL_CAPSULE | Freq: Every day | ORAL | Status: DC
  Administered 2013-11-27 – 2013-11-29 (×3): 2 via ORAL
  Filled 2013-11-26 (×3): qty 2

## 2013-11-26 MED ORDER — BUPROPION HCL ER (SR) 150 MG PO TB12
150.0000 mg | ORAL_TABLET | Freq: Two times a day (BID) | ORAL | Status: DC
  Administered 2013-11-26 – 2013-11-29 (×6): 150 mg via ORAL
  Filled 2013-11-26 (×7): qty 1

## 2013-11-26 NOTE — ED Notes (Signed)
Pt's CBG is 442 mg/dl . RN notified.

## 2013-11-26 NOTE — Progress Notes (Signed)
Patient got admitted to the unit. Patient got oriented to the room and call bell. [facility] admissions notified about the pt admission. Patient's daughter said patient is a DNR. MD notified about it. Also patient fell at home today morning and last week but never hit head. Noticed small abrasion on Rt knee. Pt on High fall risk protocol.

## 2013-11-26 NOTE — H&P (Addendum)
[facility] History and Physical  Regina [name] KWI:[id number] DOB: [birth year] DOA: 11/26/2013  Referring physician: Emergency Department PCP: Delia Chimes, NP  Specialists:   Chief Complaint: Dehydration  HPI: Regina [name] is a 73 y.o. female  With a hx of diabetes, dementia, and prior cva who presents to the ED with worsening MS changes. In the Ed, the patient was noted to have mildly elevated Cr. CT head was unremarkable for acute change. The patient was noted to have bs into the 500's, corrected with IVF. Overall, the patient's condition improved with IVF. Hospitalist was consulted for admission.  Review of Systems:  Per above, the remainder of the 10pt ros reviewed and are unremarkabler  Past Medical History  Diagnosis Date  . Hypertension   . Hyperlipidemia   . TIA (transient ischemic attack) 1990's    "mini strokes" (06/07/2013)  . Legally blind     "both eyes; some vision in the right' (06/07/2013)  . Heart murmur 1960's  . Hypothyroidism   . Type II diabetes mellitus   . Anemia   . GERD (gastroesophageal reflux disease)   . Arthritis     "left leg" (06/07/2013)  . Depression   . Vulva cancer   . Altered mental status     "the first time I noticed any problem was 2 d ago" (06/07/2013)  . Aortic stenosis     mod-severe   Past Surgical History  Procedure Laterality Date  . Femur fracture surgery Left 1999  . Cesarean section  1968  . Tubal ligation  1970's  . Vulva surgery  1990's    "cut a big chunk out for cancer" (06/07/2013)  . Transthoracic echocardiogram  02/2013    EF 55-60%, mod conc hypertrophy, grade 1 diastolic dysfunction; AV with mod stenosis; calcified MV annulus & mild MR; LA severely dilated; PA peak pressure 27mmHg   Social History:  reports that she quit smoking about 26 years ago. Her smoking use included Cigarettes. She has a 120 pack-year smoking history. She has never used smokeless tobacco. She reports that she does not drink alcohol or use  illicit drugs.  where does patient live--home, ALF, SNF? and with whom if at home?  Can patient participate in ADLs?  Allergies  Allergen Reactions  . Amoxicillin     REACTION: rash  . Ciprofloxacin     REACTION: hives  . Codeine     REACTION: nervous, shaky  . Duloxetine     REACTION: reaction not known  . Hydrochlorothiazide W-Triamterene     REACTION: hyponatremia  . Ticlopidine Hcl     REACTION: bleeding    Family History  Problem Relation Age of Onset  . Hypertension Mother   . Hyperlipidemia Mother   . Hyperlipidemia Father   . Hypertension Father   . Diabetes Father     (be sure to complete)  Prior to Admission medications   Medication Sig Start Date End Date Taking? Authorizing Provider  acidophilus (RISAQUAD) CAPS capsule Take 2 capsules by mouth daily. 07/22/13  Yes Kinnie Feil, MD  buPROPion (WELLBUTRIN SR) 150 MG 12 hr tablet Take 150 mg by mouth 2 (two) times daily.   Yes Historical Provider, MD  calcium-vitamin D (OSCAL WITH D) 500-200 MG-UNIT per tablet Take 1 tablet by mouth 2 (two) times daily.   Yes Historical Provider, MD  carvedilol (COREG) 25 MG tablet Take 1 tablet (25 mg total) by mouth 2 (two) times daily with a meal. 10/12/13  Yes Allie Bossier,  MD  dipyridamole-aspirin (AGGRENOX) 200-25 MG per 12 hr capsule Take 1 capsule by mouth 2 (two) times daily. 06/09/13  Yes Shanker Kristeen Mans, MD  DULoxetine (CYMBALTA) 60 MG capsule Take 60 mg by mouth daily.   Yes Historical Provider, MD  ezetimibe (ZETIA) 10 MG tablet Take 10 mg by mouth daily.   Yes Historical Provider, MD  ferrous fumarate (HEMOCYTE - 106 MG FE) 325 (106 FE) MG TABS tablet Take 2 tablets by mouth daily.    Yes Historical Provider, MD  insulin aspart (NOVOLOG) 100 UNIT/ML injection Inject 5 Units into the skin 3 (three) times daily with meals. If blood sugar > 200. 10/12/13  Yes Allie Bossier, MD  insulin detemir (LEVEMIR) 100 UNIT/ML injection Inject 35 Units into the skin 2 (two) times  daily. 07/31/13  Yes Philemon Kingdom, MD  levothyroxine (SYNTHROID, LEVOTHROID) 75 MCG tablet Take 1 tablet (75 mcg total) by mouth daily before breakfast. 06/07/13  Yes Nishant Dhungel, MD  lisinopril (PRINIVIL,ZESTRIL) 5 MG tablet Take 1 tablet (5 mg total) by mouth daily. 10/12/13  Yes Allie Bossier, MD  Memantine HCl ER (NAMENDA XR) 7 MG CP24 Take 7 mg by mouth daily.   Yes Historical Provider, MD  Multiple Vitamin (MULTIVITAMIN WITH MINERALS) TABS tablet Take 1 tablet by mouth daily.   Yes Historical Provider, MD  Omega-3 Fatty Acids (FISH OIL) 1000 MG CAPS Take 2,000 mg by mouth daily.   Yes Historical Provider, MD  ranitidine (ZANTAC) 75 MG tablet Take 75 mg by mouth 2 (two) times daily.   Yes Historical Provider, MD  simvastatin (ZOCOR) 40 MG tablet Take 40 mg by mouth at bedtime.   Yes Historical Provider, MD  Travoprost, BAK Free, (TRAVATAN) 0.004 % SOLN ophthalmic solution Place 1 drop into both eyes at bedtime.   Yes Historical Provider, MD   Physical Exam: Filed Vitals:   11/26/13 1708 11/26/13 1730 11/26/13 1830 11/26/13 1902  BP: 176/77 178/67 140/76 144/65  Pulse: 71 69 74 73  Temp:    98.4 F (36.9 C)  TempSrc:    Oral  Resp: 23 16 16 14   SpO2: 98% 100% 98% 98%     General:  Awake, in nad  Eyes: PERRL B  ENT: membranes dry, dentition fair  Neck: trachea midline, neck supple  Cardiovascular: regular, s1, s2  Respiratory: normal resp effort, no wheezing  Abdomen: soft, nondistended  Skin: normal skin turgor, no abnormal skin lesions seen  Musculoskeletal: perfused, no clubbing   Psychiatric: mood/affect normal // no auditory/visual hallucinations  Neurologic: cn2-12 grossly intact, strength/sensation intact  Labs on Admission:  Basic Metabolic Panel:  Recent Labs Lab 11/26/13 1545  NA 133*  K 4.2  CL 93*  CO2 25  GLUCOSE 410*  BUN 23  CREATININE 1.27*  CALCIUM 9.5   Liver Function Tests:  Recent Labs Lab 11/26/13 1545  AST 49*  ALT 53*   ALKPHOS 70  BILITOT 0.5  PROT 6.9  ALBUMIN 3.6   No results found for this basename: LIPASE, AMYLASE,  in the last 168 hours No results found for this basename: AMMONIA,  in the last 168 hours CBC:  Recent Labs Lab 11/26/13 1650  WBC 8.3  NEUTROABS 5.9  HGB 12.8  HCT 37.3  MCV 85.9  PLT 149*   Cardiac Enzymes: No results found for this basename: CKTOTAL, CKMB, CKMBINDEX, TROPONINI,  in the last 168 hours  BNP (last 3 results) No results found for this basename: PROBNP,  in the  last 8760 hours CBG:  Recent Labs Lab 11/26/13 1530 11/26/13 1714 11/26/13 1835  GLUCAP 442* 328* 263*    Radiological Exams on Admission: Dg Chest 2 View  11/26/2013   CLINICAL DATA:  Hyperglycemia.  Altered mental status.  EXAM: CHEST  2 VIEW  COMPARISON:  PA and lateral chest 12/20/2012.  FINDINGS: There is cardiomegaly but no pulmonary edema. Lungs are clear. No pneumothorax or pleural effusion. Thoracic spondylosis and degenerative change about the shoulders is noted.  IMPRESSION: Cardiomegaly without acute disease.   Electronically Signed   By: Inge Rise M.D.   On: 11/26/2013 16:36   Ct Head Wo Contrast  11/26/2013   CLINICAL DATA:  HYPERGLYCEMIA ALTERED MENTAL STATUS  EXAM: CT HEAD WITHOUT CONTRAST  TECHNIQUE: Contiguous axial images were obtained from the base of the skull through the vertex without intravenous contrast.  COMPARISON:  CT HEAD W/O CM dated 10/10/2013  FINDINGS: No acute intracranial abnormality. Specifically, no hemorrhage, hydrocephalus, mass lesion, acute infarction, or significant intracranial injury. No acute calvarial abnormality. Global atrophy. Diffuse areas of low attenuation are appreciated within the subcortical, deep, and periventricular white matter regions. Punctate areas of low attenuation project within the superior aspect of the basal ganglia regions bilaterally. These areas are small punctate. The visualized paranasal sinuses are patent.  IMPRESSION:  Findings consistent with chronic areas of the lacune infarction within the superior aspect of the basal ganglia regions. Findings consistent with chronic small vessel ischemic changes. No acute abnormalities identified.   Electronically Signed   By: Margaree Mackintosh M.D.   On: 11/26/2013 16:16      Assessment/Plan Principal Problem:   Acute renal failure Active Problems:   HYPOTHYROIDISM   Type II or unspecified type diabetes mellitus with ophthalmic manifestations, uncontrolled   Altered mental status   HTN (hypertension)   CVA (cerebral infarction)   Dehydration   Dementia   1. AMS 1. Likely related to dehydration 2. Improving with IVF 3. Admit to med-surg 2. ARF 1. Cont with IVF as tolerated 2. Follow renal fx 3. DM 1. Poorly controlled. 2. Cont insulin and titrate as needed 3. Cont IVF 4. Dementia 1. Appears stable 2. Cont meds 5. HTN 1. Presently stable 2. Will hold ACEI given concerns of acute renal failure 3. Cont other meds 4. Add PRN hydralazine 6. Dispo 1. Will likely need SNF as pt lives alone and family can no longer care for pt 2. Will consult PT/OT and SW 7. DVT prophylaxis 1. Heparin  Code Status: Full (must indicate code status--if unknown or must be presumed, indicate so) Family Communication: pt and daughter in room (indicate person spoken with, if applicable, with phone number if by telephone) Disposition Plan: Pending (indicate anticipated LOS)  Time spent: 71m,in  Oline Belk, [facility] Pager [phone]  If 7PM-7AM, please contact night-coverage [email or web address] Password [facility] 11/26/2013, 7:14 PM

## 2013-11-26 NOTE — ED Notes (Signed)
NOTIFIED DR. WARD IN PERSON OF PATIENTS LAB RESULTS OF CG4+ LACTIC ACID ,11/26/2013.

## 2013-11-26 NOTE — ED Notes (Signed)
Hospitalist at bedside 

## 2013-11-26 NOTE — ED Notes (Signed)
CBG 242

## 2013-11-26 NOTE — ED Provider Notes (Signed)
TIME SEEN: 3:21 PM  CHIEF COMPLAINT: AMS, hyperglycemia  HPI: Patient is a 73 year old female with a history of dementia, diabetes with prior history of DKA, TIAs who presents emergency murmur with altered mental status that started on Wednesday, 4 days ago. Family reports that she has a history of dementia and sundowning. She did recently finish a course of Cipro for UTI 4 days ago. She's also recently started on Namenda, Cymbalta, Wellbutrin. Family reports the patient has also been hyperglycemic since Friday, 2 days ago. They have tried giving her extra insulin without relief.  They deny that the patient has had any fever, cough, vomiting or diarrhea. No known head injury.  ROS: Level V caveat for dementia  PAST MEDICAL HISTORY/PAST SURGICAL HISTORY:  Past Medical History  Diagnosis Date  . Hypertension   . Hyperlipidemia   . TIA (transient ischemic attack) 1990's    "mini strokes" (06/07/2013)  . Legally blind     "both eyes; some vision in the right' (06/07/2013)  . Heart murmur 1960's  . Hypothyroidism   . Type II diabetes mellitus   . Anemia   . GERD (gastroesophageal reflux disease)   . Arthritis     "left leg" (06/07/2013)  . Depression   . Vulva cancer   . Altered mental status     "the first time I noticed any problem was 2 d ago" (06/07/2013)  . Aortic stenosis     mod-severe    MEDICATIONS:  Prior to Admission medications   Medication Sig Start Date End Date Taking? Authorizing Provider  acidophilus (RISAQUAD) CAPS capsule Take 2 capsules by mouth daily. 07/22/13   Kinnie Feil, MD  calcium-vitamin D (OSCAL WITH D) 500-200 MG-UNIT per tablet Take 1 tablet by mouth 2 (two) times daily.    Historical Provider, MD  carvedilol (COREG) 25 MG tablet Take 1 tablet (25 mg total) by mouth 2 (two) times daily with a meal. 10/12/13   Allie Bossier, MD  cefixime (SUPRAX) 400 MG tablet Take 1 tablet (400 mg total) by mouth daily. 10/12/13   Allie Bossier, MD  dipyridamole-aspirin  (AGGRENOX) 200-25 MG per 12 hr capsule Take 1 capsule by mouth 2 (two) times daily. 06/09/13   Shanker Kristeen Mans, MD  ezetimibe (ZETIA) 10 MG tablet Take 10 mg by mouth daily.    Historical Provider, MD  ferrous fumarate (HEMOCYTE - 106 MG FE) 325 (106 FE) MG TABS tablet Take 2 tablets by mouth daily.     Historical Provider, MD  insulin aspart (NOVOLOG) 100 UNIT/ML injection Inject 5 Units into the skin 3 (three) times daily with meals. If blood sugar > 200. 10/12/13   Allie Bossier, MD  insulin detemir (LEVEMIR) 100 UNIT/ML injection Inject 0.3 mLs (30 Units total) into the skin 2 (two) times daily. 07/31/13   Philemon Kingdom, MD  levothyroxine (SYNTHROID, LEVOTHROID) 75 MCG tablet Take 1 tablet (75 mcg total) by mouth daily before breakfast. 06/07/13   Nishant Dhungel, MD  lisinopril (PRINIVIL,ZESTRIL) 5 MG tablet Take 1 tablet (5 mg total) by mouth daily. 10/12/13   Allie Bossier, MD  Multiple Vitamin (MULTIVITAMIN WITH MINERALS) TABS tablet Take 1 tablet by mouth daily.    Historical Provider, MD  Omega-3 Fatty Acids (FISH OIL) 1000 MG CAPS Take 2,000 mg by mouth daily.    Historical Provider, MD  ranitidine (ZANTAC) 75 MG tablet Take 75 mg by mouth 2 (two) times daily.    Historical Provider, MD  simvastatin (ZOCOR)  40 MG tablet Take 40 mg by mouth at bedtime.    Historical Provider, MD  Travoprost, BAK Free, (TRAVATAN) 0.004 % SOLN ophthalmic solution Place 1 drop into both eyes at bedtime.    Historical Provider, MD    ALLERGIES:  Allergies  Allergen Reactions  . Amoxicillin     REACTION: rash  . Ciprofloxacin     REACTION: hives  . Codeine     REACTION: nervous, shaky  . Duloxetine     REACTION: reaction not known  . Hydrochlorothiazide W-Triamterene     REACTION: hyponatremia  . Ticlopidine Hcl     REACTION: bleeding    SOCIAL HISTORY:  History  Substance Use Topics  . Smoking status: Former Smoker -- 4.00 packs/day for 30 years    Types: Cigarettes    Quit date: 09/14/1987   . Smokeless tobacco: Never Used     Comment: 06/07/2013 "hasn't smoked since her stroke in the 1990's"  . Alcohol Use: No    FAMILY HISTORY: Family History  Problem Relation Age of Onset  . Hypertension Mother   . Hyperlipidemia Mother   . Hyperlipidemia Father   . Hypertension Father   . Diabetes Father     EXAM: BP 165/64  Pulse 72  Temp(Src) 97.5 F (36.4 C) (Rectal)  Resp 18  SpO2 98% CONSTITUTIONAL: Alert and oriented to person and place but disoriented to time and responds appropriately to questions. Well-appearing; well-nourished, sleeping but easily arousable falls back asleep quickly, no apparent distress HEAD: Normocephalic EYES: Conjunctivae clear, PERRL ENT: normal nose; no rhinorrhea; moist mucous membranes; pharynx without lesions noted NECK: Supple, no meningismus, no LAD  CARD: RRR; S1 and S2 appreciated; no murmurs, no clicks, no rubs, no gallops RESP: Normal chest excursion without splinting or tachypnea; breath sounds clear and equal bilaterally; no wheezes, no rhonchi, no rales,  ABD/GI: Normal bowel sounds; non-distended; soft, non-tender, no rebound, no guarding BACK:  The back appears normal and is non-tender to palpation, there is no CVA tenderness EXT: Normal ROM in all joints; non-tender to palpation; no edema; normal capillary refill; no cyanosis    SKIN: Normal color for age and race; warm NEURO: Moves all extremities equally, cranial nerves II through XII intact, sensation to light touch intact diffusely PSYCH: The patient's mood and manner are appropriate. Grooming and personal hygiene are appropriate.  MEDICAL DECISION MAKING: Patient here with altered mental status for the past 4 days. She has a history of dementia and has had prior episodes of DKA, TIAs and was recently treated for UTI. We'll check labs including troponin, urine and chest x-ray to evaluate for infection, head CT. Patient's glucose is greater than 500. Will give IV fluids. Patient  may need insulin drip.   ED PROGRESS: Patient's blood glucoses improving with IV fluids. Her labs show a mild acute renal failure with creatinine of 1.27. Chest x-ray is clear. Head CT shows no acute abnormalities. Troponin negative. Lactate normal. Urine shows ketones but no sign of infection. Her blood gas shows a slight metabolic alkalosis. Her anion gap is slightly elevated at 15. We'll continue IV hydration. Given patient is altered and appears dry on exam and his hyperglycemia, will admit for IV hydration and observation. Family agrees with this plan. Her PCP is Delia Chimes, FNP in [place]. Patient's mental status is slowly improving with IV fluids.  6:29 PM  Spoke with hospitalist for admission.     EKG Interpretation    Date/Time:  Sunday November 26 2013 15:13:30  EST Ventricular Rate:  69 PR Interval:  204 QRS Duration: 111 QT Interval:  446 QTC Calculation: 478 R Axis:   -4 Text Interpretation:  Sinus rhythm Probable left atrial enlargement LVH with secondary repolarization abnormality Inferior infarct, old No significant change since last tracing Confirmed by WARD  DO, KRISTEN ([id number]) on 11/26/2013 4:30:10 PM             [place], DO 11/26/13 1829

## 2013-11-26 NOTE — ED Notes (Signed)
Per EMS pt came from home c/o confusion since last Wednesday that has been increasing. Pt CBG reading greater than 500 with EMS, EMS gave 100 mL NS. Pt negative for stroke with EMS, ambulatory. Pt has hx of dementia and severe sundowners. Pt Pt finished antibiotics on Wednesday for UTI. Pt also had medication adjustment Wednesday and was put on Wellbutrin. Family reports blood sugar has been elevated at home since being put on wellbutrin, even with increased dose of insulin.

## 2013-11-27 DIAGNOSIS — G9340 Encephalopathy, unspecified: Secondary | ICD-10-CM

## 2013-11-27 DIAGNOSIS — E039 Hypothyroidism, unspecified: Secondary | ICD-10-CM

## 2013-11-27 DIAGNOSIS — E1101 Type 2 diabetes mellitus with hyperosmolarity with coma: Secondary | ICD-10-CM

## 2013-11-27 LAB — COMPREHENSIVE METABOLIC PANEL WITH GFR
ALT: 37 U/L — ABNORMAL HIGH (ref 0–35)
AST: 29 U/L (ref 0–37)
Albumin: 3 g/dL — ABNORMAL LOW (ref 3.5–5.2)
Alkaline Phosphatase: 59 U/L (ref 39–117)
BUN: 17 mg/dL (ref 6–23)
CO2: 26 meq/L (ref 19–32)
Calcium: 9 mg/dL (ref 8.4–10.5)
Chloride: 100 meq/L (ref 96–112)
Creatinine, Ser: 1 mg/dL (ref 0.50–1.10)
GFR calc Af Amer: 64 mL/min — ABNORMAL LOW
GFR calc non Af Amer: 55 mL/min — ABNORMAL LOW
Glucose, Bld: 150 mg/dL — ABNORMAL HIGH (ref 70–99)
Potassium: 4 meq/L (ref 3.7–5.3)
Sodium: 137 meq/L (ref 137–147)
Total Bilirubin: 0.4 mg/dL (ref 0.3–1.2)
Total Protein: 5.9 g/dL — ABNORMAL LOW (ref 6.0–8.3)

## 2013-11-27 LAB — CBC
HCT: 36.5 % (ref 36.0–46.0)
Hemoglobin: 12.6 g/dL (ref 12.0–15.0)
MCH: 30 pg (ref 26.0–34.0)
MCHC: 34.5 g/dL (ref 30.0–36.0)
MCV: 86.9 fL (ref 78.0–100.0)
Platelets: 194 K/uL (ref 150–400)
RBC: 4.2 MIL/uL (ref 3.87–5.11)
RDW: 13.3 % (ref 11.5–15.5)
WBC: 6.3 K/uL (ref 4.0–10.5)

## 2013-11-27 LAB — GLUCOSE, CAPILLARY
GLUCOSE-CAPILLARY: 159 mg/dL — AB (ref 70–99)
Glucose-Capillary: 139 mg/dL — ABNORMAL HIGH (ref 70–99)
Glucose-Capillary: 161 mg/dL — ABNORMAL HIGH (ref 70–99)
Glucose-Capillary: 175 mg/dL — ABNORMAL HIGH (ref 70–99)

## 2013-11-27 LAB — URINE CULTURE
COLONY COUNT: NO GROWTH
CULTURE: NO GROWTH

## 2013-11-27 LAB — HEMOGLOBIN A1C
Hgb A1c MFr Bld: 9.6 % — ABNORMAL HIGH (ref <5.7)
MEAN PLASMA GLUCOSE: 229 mg/dL — AB (ref <117)

## 2013-11-27 MED ORDER — INSULIN ASPART 100 UNIT/ML ~~LOC~~ SOLN: 0.0000 [IU] | Freq: Every day | SUBCUTANEOUS | Status: DC

## 2013-11-27 MED ORDER — INSULIN ASPART 100 UNIT/ML ~~LOC~~ SOLN
0.0000 [IU] | Freq: Three times a day (TID) | SUBCUTANEOUS | Status: DC
  Administered 2013-11-27 – 2013-11-28 (×4): 3 [IU] via SUBCUTANEOUS
  Administered 2013-11-28: 2 [IU] via SUBCUTANEOUS
  Administered 2013-11-29: 5 [IU] via SUBCUTANEOUS
  Administered 2013-11-29: 2 [IU] via SUBCUTANEOUS

## 2013-11-27 MED ORDER — ENSURE PUDDING PO PUDG: 1.0000 | Freq: Two times a day (BID) | ORAL | Status: DC | PRN

## 2013-11-27 NOTE — Progress Notes (Signed)
INITIAL NUTRITION ASSESSMENT  DOCUMENTATION CODES Per approved criteria  -Non-severe (moderate) malnutrition in the context of chronic illness -Obesity Unspecified  Pt meets criteria for Moderate MALNUTRITION in the context of [place] Illness as evidenced by 18% weight loss in less than 5 months and estimated energy intake <75% of estimated energy needs for >/= 1 month.  INTERVENTION: Provide Ensure Pudding PRN Provide Multivitamin with minerals daily RD to continue to monitor  NUTRITION DIAGNOSIS: Inadequate oral intake related to restricted diet/decreased appetite as evidenced by 18% weight loss in less than 5 months.   Goal: Pt to meet >/= 90% of their estimated nutrition needs   Monitor:  PO intake, weight trends, labs  Reason for Assessment: Malnutrition Screening Tool, score of 2  73 y.o. female  Admitting Dx: Acute renal failure  ASSESSMENT: 73 y.o. female with a hx of diabetes, dementia, and prior cva who presents to the ED with worsening MS changes. In the Ed, the patient was noted to have mildly elevated Cr.   Pt resting at time of visit. Discussed pt with pt's daughter at bedside. Daughter states that pt used to be very overweight so, when pt was no longer able to to prepare her own food, they started preparing pt healthful meals. Pt started to lose weight due to pt eating healthier food, limited portion sizes served, and inability for pt to sneak junk food. Daughter states that pt had a good appetite and was eating well until this past month- for the past month pt has had a decreased appetite and has been eating <50% of meals. Daughter reports trying to give pt Ensure and Glucerna supplements but, pt does not like them. Pt did eat Magic Cup ice cream during previous admission. Pt is doing better today and ate 75% of breakfast and 90% of lunch.Weight history shows pt has lost 18% of her bodyweight within the past 5 months.   Height: Ht Readings from Last 1 Encounters:   11/26/13 5' (1.524 m)    Weight: Wt Readings from Last 1 Encounters:  11/26/13 192 lb (87.091 kg)    Ideal Body Weight: 100 lbs  % Ideal Body Weight: 192%  Wt Readings from Last 10 Encounters:  11/26/13 192 lb (87.091 kg)  10/11/13 201 lb 4.8 oz (91.309 kg)  09/29/13 201 lb 4.8 oz (91.309 kg)  07/18/13 233 lb 11 oz (106 kg)  06/08/13 229 lb 1.6 oz (103.919 kg)  06/05/13 233 lb 4 oz (105.8 kg)  04/20/07 264 lb (119.75 kg)    Usual Body Weight: 235 lbs  % Usual Body Weight: 82%  BMI:  Body mass index is 37.5 kg/(m^2). (Obese)  Estimated Nutritional Needs: Kcal: 1600-1800 Protein: 85-95 grams Fluid: 1.6-1.8 L/day  Skin: WDL  Diet Order: Carb Control  EDUCATION NEEDS: -No education needs identified at this time   Intake/Output Summary (Last 24 hours) at 11/27/13 1358 Last data filed at 11/27/13 1314  Gross per 24 hour  Intake    480 ml  Output      0 ml  Net    480 ml    Last BM: PTA   Labs:   Recent Labs Lab 11/26/13 1545 11/27/13 0558  NA 133* 137  K 4.2 4.0  CL 93* 100  CO2 25 26  BUN 23 17  CREATININE 1.27* 1.00  CALCIUM 9.5 9.0  GLUCOSE 410* 150*    CBG (last 3)   Recent Labs  11/26/13 2219 11/27/13 0651 11/27/13 1153  GLUCAP 228* 139* 159*  Scheduled Meds: . acidophilus  2 capsule Oral Daily  . buPROPion  150 mg Oral BID  . calcium-vitamin D  1 tablet Oral BID  . carvedilol  25 mg Oral BID WC  . dipyridamole-aspirin  1 capsule Oral BID  . DULoxetine  60 mg Oral Daily  . ezetimibe  10 mg Oral Daily  . ferrous fumarate  2 tablet Oral Daily  . heparin  5,000 Units Subcutaneous 3 times per day  . insulin aspart  0-15 Units Subcutaneous TID WC  . insulin aspart  0-5 Units Subcutaneous QHS  . insulin aspart  5 Units Subcutaneous TID WC  . insulin detemir  35 Units Subcutaneous BID  . levothyroxine  75 mcg Oral QAC breakfast  . memantine  5 mg Oral Daily  . multivitamin with minerals  1 tablet Oral Daily  . simvastatin  40  mg Oral QHS    Continuous Infusions: . sodium chloride 100 mL/hr at 11/27/13 0258    Past Medical History  Diagnosis Date  . Hypertension   . Hyperlipidemia   . TIA (transient ischemic attack) 1990's    "mini strokes" (06/07/2013)  . Legally blind     "both eyes; some vision in the right' (06/07/2013)  . Heart murmur 1960's  . Hypothyroidism   . Type II diabetes mellitus   . Anemia   . GERD (gastroesophageal reflux disease)   . Arthritis     "left leg" (06/07/2013)  . Depression   . Vulva cancer   . Altered mental status     "the first time I noticed any problem was 2 d ago" (06/07/2013)  . Aortic stenosis     mod-severe    Past Surgical History  Procedure Laterality Date  . Femur fracture surgery Left 1999  . Cesarean section  1968  . Tubal ligation  1970's  . Vulva surgery  1990's    "cut a big chunk out for cancer" (06/07/2013)  . Transthoracic echocardiogram  02/2013    EF 55-60%, mod conc hypertrophy, grade 1 diastolic dysfunction; AV with mod stenosis; calcified MV annulus & mild MR; LA severely dilated; PA peak pressure 38mmHg    Pryor Ochoa RD, LDN Inpatient Clinical Dietitian Pager: [phone] After Hours Pager: [phone]

## 2013-11-27 NOTE — Evaluation (Addendum)
Physical Therapy Evaluation Patient Details Name: Regina [name] MRN: [id number] DOB: 1941-04-15 Today's Date: 11/27/2013 Time: 7846-9629 PT Time Calculation (min): 24 min  PT Assessment / Plan / Recommendation History of Present Illness  pt rpesents with multiple falls, Dehydration, weakness, and hx of Dementia and CVA.    Clinical Impression  Pt generally weak and deconditioned.  Pt only oriented to self, but demonstrates recall of place when asked again at end of session.  Unclear level of support at home as pt's answers are not reliable.  Pt stating her husband will help her at home, but need to confirm this.  At this time SNF is safest D/C plan.  Will continue to follow.      PT Assessment  Patient needs continued PT services    Follow Up Recommendations  SNF    Does the patient have the potential to tolerate intense rehabilitation      Barriers to Discharge   Unclear level of support at home.      Equipment Recommendations  Rolling walker with 5" wheels    Recommendations for Other Services     Frequency Min 2X/week    Precautions / Restrictions Precautions Precautions: Fall Precaution Comments: pt leagally blind, but states she can make out big objects like people and doorway.   Restrictions Weight Bearing Restrictions: No   Pertinent Vitals/Pain Indicates L knee gets "a little sore" during ambulation.        Mobility  Bed Mobility Overal bed mobility: Needs Assistance Bed Mobility: Supine to Sit Supine to sit: Supervision General bed mobility comments: cues for initiation and pt moves slowly.   Transfers Overall transfer level: Needs assistance Equipment used: None Transfers: Sit to/from [facility] Sit to Stand: Min assist Stand pivot transfers: Min assist General transfer comment: cues for UE use and movement through SPT.  cues to get closer to recliner prior to sitting.   Ambulation/Gait Ambulation/Gait assistance: Min guard Ambulation  Distance (Feet): 30 Feet Assistive device: Rolling walker (2 wheeled) Gait Pattern/deviations: Step-through pattern;Decreased stride length General Gait Details: cues for positioning within RW, upright posture.  pt fatigues qucikly.      Exercises     PT Diagnosis: Difficulty walking;Generalized weakness  PT Problem List: Decreased strength;Decreased activity tolerance;Decreased balance;Decreased mobility;Decreased coordination;Decreased cognition;Decreased knowledge of use of DME PT Treatment Interventions: DME instruction;Gait training;Stair training;Functional mobility training;Therapeutic activities;Therapeutic exercise;Balance training;Neuromuscular re-education;Cognitive remediation;Patient/family education     PT Goals(Current goals can be found in the care plan section) Acute Rehab PT Goals Patient Stated Goal: None stated PT Goal Formulation: Patient unable to participate in goal setting Time For Goal Achievement: 12/11/13 Potential to Achieve Goals: Good  Visit Information  Last PT Received On: 11/27/13 Assistance Needed: +1 History of Present Illness: pt rpesents with multiple falls, Dehydration, weakness, and hx of Dementia and CVA.         Prior [place] expects to be discharged to:: Skilled nursing facility Additional Comments: Unclear as pt not reliable.   Prior Function Comments: Unclear as pt not reliable.     Cognition  Cognition Arousal/Alertness: Awake/alert Behavior During Therapy: WFL for tasks assessed/performed Overall Cognitive Status: No family/caregiver present to determine baseline cognitive functioning General Comments: Unclear pt's baseline cognition.  pt only oriented to person, but does recall place when asked again at end of session.  pt follows all directions, though at times needs increased time.      Extremity/Trunk Assessment Upper Extremity Assessment Upper Extremity Assessment: Defer  to OT evaluation Lower  Extremity Assessment Lower Extremity Assessment: Generalized weakness   Balance Balance Overall balance assessment: Needs assistance Standing balance support: No upper extremity supported;Bilateral upper extremity supported Standing balance-Leahy Scale: Fair  End of Session PT - End of Session Equipment Utilized During Treatment: Gait belt Activity Tolerance: Patient limited by fatigue Patient left: in chair;with call bell/phone within reach;with chair alarm set Nurse Communication: Mobility status  GP Functional Assessment Tool Used: Clinical Judgement Functional Limitation: Mobility: Walking and moving around Mobility: Walking and Moving Around Current Status (O1410): At least 20 percent but less than 40 percent impaired, limited or restricted Mobility: Walking and Moving Around Goal Status [phone]): At least 1 percent but less than 20 percent impaired, limited or restricted   Catarina Hartshorn, [place] 11/27/2013, 9:07 AM

## 2013-11-27 NOTE — Progress Notes (Signed)
UR completed 

## 2013-11-27 NOTE — Progress Notes (Signed)
Inpatient Diabetes Program Recommendations  AACE/ADA: New Consensus Statement on Inpatient Glycemic Control (2013)  Target Ranges:  Prepandial:   less than 140 mg/dL      Peak postprandial:   less than 180 mg/dL (1-2 hours)      Critically ill patients:  140 - 180 mg/dL    Inpatient Diabetes Program Recommendations Correction (SSI): consider adding Novolog moderate scale TID + HS scale per Glycemic Control order set Thank you  Raoul Pitch BSN, RN,CDE Inpatient Diabetes Coordinator [phone] (team pager)

## 2013-11-27 NOTE — Evaluation (Signed)
Occupational Therapy Evaluation Patient Details Name: SCOTTY PINDER MRN: [id number] DOB: 10-21-1940 Today's Date: 11/27/2013 Time: 2229-7989 OT Time Calculation (min): 22 min  OT Assessment / Plan / Recommendation History of present illness pt rpesents with multiple falls, Dehydration, weakness, and hx of Dementia and CVA.     Clinical Impression   Pt presents with below problem list. Spoke with daughter towards end of session to get more home setup information. Pt does not have anyone with her 24/7. Spoke with daughter about therapy recommending SNF for additional rehab. Daughter agreeable to this and prefers [facility]. Feel pt will benefit from acute OT to increase independence and safety prior to d/c.       OT Assessment  Patient needs continued OT Services    Follow Up Recommendations  SNF    Barriers to Discharge Decreased caregiver support    Equipment Recommendations  Other (comment) (defer to next venue)    Recommendations for Other Services    Frequency  Min 2X/week    Precautions / Restrictions Precautions Precautions: Fall Precaution Comments: pt legally blind, but states she can make out big objects like people and doorway.   Restrictions Weight Bearing Restrictions: No   Pertinent Vitals/Pain Pain in left knee. Nurse notified.     ADL  Grooming: Wash/dry face;Teeth care;Set up;Supervision/safety Where Assessed - Grooming: Supported standing Lower Body Dressing: Minimal assistance (with AE (sockaid)) Where Assessed - Lower Body Dressing: Supported sit to Lobbyist: Magazine features editor Method: Sit to Loss adjuster, chartered: Comfort height toilet;Grab bars Toileting - Water quality scientist and Hygiene: Minimal assistance Where Assessed - Best boy and Hygiene: Standing;Sit on 3-in-1 or toilet Tub/Shower Transfer Method: Not assessed Equipment Used: Gait belt;Rolling walker;Sock aid Transfers/Ambulation  Related to ADLs: Min guard ADL Comments: Recommended sitting for dressing. Educated on dressing technique to daughter and pt. Pt requiring assistance for socks without sockaid, but then daughter came later and informed OT that pt has sockaid she uses at home, so pt practiced with this and able to use it correctly. Pt requiring rest breaks during ambulation-breathing heavily. Educated on deep breathing technique.    OT Diagnosis: Acute pain;Generalized weakness;Cognitive deficits  OT Problem List: Decreased strength;Decreased activity tolerance;Impaired balance (sitting and/or standing);Decreased knowledge of use of DME or AE;Decreased cognition;Decreased knowledge of precautions;Pain;Impaired vision/perception OT Treatment Interventions: Self-care/ADL training;Therapeutic exercise;DME and/or AE instruction;Therapeutic activities;Cognitive remediation/compensation;Patient/family education;Balance training;Visual/perceptual remediation/compensation   OT Goals(Current goals can be found in the care plan section) Acute Rehab OT Goals Patient Stated Goal: None stated OT Goal Formulation: With patient/family Time For Goal Achievement: 12/04/13 Potential to Achieve Goals: Good ADL Goals Pt Will Perform Upper Body Bathing: sitting;with supervision (including gathering items) Pt Will Perform Lower Body Bathing: with supervision;sit to/from stand (including gathering items) Pt Will Perform Lower Body Dressing: with supervision;with adaptive equipment;sit to/from stand (including gathering items) Pt Will Transfer to Toilet: with supervision;ambulating;grab bars (comfort height toilet) Pt Will Perform Toileting - Clothing Manipulation and hygiene: with modified independence;sit to/from stand  Visit Information  Last OT Received On: 11/27/13 Assistance Needed: +1 History of Present Illness: pt rpesents with multiple falls, Dehydration, weakness, and hx of Dementia and CVA.         Prior  Functioning     Home Living Family/patient expects to be discharged to:: Skilled nursing facility Living Arrangements: Alone Available Help at Discharge: Family;Available PRN/intermittently Type of Home: House Home Access: Stairs to enter Entrance Stairs-Number of Steps: 1 Entrance Stairs-Rails: None Home Layout:  One level Home Equipment: Shower seat;Grab bars - toilet Additional Comments: Unclear as pt not reliable.   Prior Function Level of Independence: Needs assistance ADL's / Homemaking Assistance Needed: daughter assists with medication management, cooking, and cleaning Comments: Unclear as pt not reliable.  Communication Communication: No difficulties Dominant Hand: Right         Vision/Perception Vision - History Baseline Vision: Legally blind   Cognition  Cognition Arousal/Alertness: Awake/alert Behavior During Therapy: WFL for tasks assessed/performed Overall Cognitive Status: History of cognitive impairments - at baseline General Comments: Unclear pt's baseline cognition.  pt only oriented to person, but does recall place when asked again at end of session.  pt follows all directions, though at times needs increased time.      Extremity/Trunk Assessment Upper Extremity Assessment Upper Extremity Assessment: Generalized weakness Lower Extremity Assessment Lower Extremity Assessment: Defer to PT evaluation     Mobility Bed Mobility Overal bed mobility: Needs Assistance Bed Mobility: Supine to Sit Supine to sit: Supervision General bed mobility comments: cues for initiation and pt moves slowly.   Transfers Overall transfer level: Needs assistance Equipment used: Rolling walker (2 wheeled) Transfers: Sit to/from Stand Sit to Stand: Min guard Stand pivot transfers: Min assist General transfer comment: cues for technique.     Exercise        End of Session OT - End of Session Equipment Utilized During Treatment: Gait belt;Rolling walker Activity  Tolerance: Patient limited by fatigue Patient left: in chair;with call bell/phone within reach;with chair alarm set;with family/visitor present Nurse Communication:  (pain in left knee)  GO Functional Assessment Tool Used: clinical judgment Functional Limitation: Self care Self Care Current Status (S5053): At least 20 percent but less than 40 percent impaired, limited or restricted Self Care Goal Status (Z7673): At least 1 percent but less than 20 percent impaired, limited or restricted   Benito Mccreedy OTR/L [phone] 11/27/2013, 10:48 AM

## 2013-11-27 NOTE — Progress Notes (Signed)
[facility] PROGRESS NOTE  Regina [name] UXL:[id number] DOB: 01/26/1941 DOA: 11/26/2013 PCP: Delia Chimes, NP  Assessment/Plan: #1 acute encephalopathy Likely secondary to dehydration and hyperglycemia. Patient alert in order x3. Continue hydration with IV fluids. Follow.  #2 acute renal failure Likely secondary to prerenal azotemia. Improved with hydration. Continue to hold ACE inhibitor. Follow.  #3 poorly controlled diabetes mellitus CBGs have improved. Check a hemoglobin A1c. Continue hydration. Continue Levemir and sliding scale insulin. Place on meal coverage insulin. Follow.  #4 hypo-thyroidism Continue home regimen of Synthroid.  #5 dementia Stable. Continue home regimen.  #6 hypertension ACE inhibitor on hold. Blood pressure stable. Continue Coreg.  #7 prophylaxis Heparin for DVT prophylaxis.  Code Status: DO NOT RESUSCITATE Family Communication: Updated patient and daughter at bedside. Disposition Plan: Skilled nursing facility when medically stable.   Consultants:  None  Procedures:  Chest x-ray 11/26/2013  CT head 11/26/2013  Antibiotics:  None  HPI/Subjective: Patient alert and oriented x3. Patient in no distress.  Objective: Filed Vitals:   11/27/13 0935  BP: 136/76  Pulse: 77  Temp: 98.1 F (36.7 C)  Resp: 20    Intake/Output Summary (Last 24 hours) at 11/27/13 1154 Last data filed at 11/27/13 0825  Gross per 24 hour  Intake    240 ml  Output      0 ml  Net    240 ml   Filed Weights   11/26/13 2030  Weight: 87.091 kg (192 lb)    Exam:   General:  NAD  Cardiovascular: RRR  Respiratory: CTAB  Abdomen: Soft, nontender, nondistended, positive bowel sounds.  Musculoskeletal: No clubbing cyanosis or edema.   Data Reviewed: Basic Metabolic Panel:  Recent Labs Lab 11/26/13 1545 11/27/13 0558  NA 133* 137  K 4.2 4.0  CL 93* 100  CO2 25 26  GLUCOSE 410* 150*  BUN 23 17  CREATININE 1.27* 1.00  CALCIUM 9.5 9.0    Liver Function Tests:  Recent Labs Lab 11/26/13 1545 11/27/13 0558  AST 49* 29  ALT 53* 37*  ALKPHOS 70 59  BILITOT 0.5 0.4  PROT 6.9 5.9*  ALBUMIN 3.6 3.0*   No results found for this basename: LIPASE, AMYLASE,  in the last 168 hours No results found for this basename: AMMONIA,  in the last 168 hours CBC:  Recent Labs Lab 11/26/13 1650 11/27/13 0558  WBC 8.3 6.3  NEUTROABS 5.9  --   HGB 12.8 12.6  HCT 37.3 36.5  MCV 85.9 86.9  PLT 149* 194   Cardiac Enzymes: No results found for this basename: CKTOTAL, CKMB, CKMBINDEX, TROPONINI,  in the last 168 hours BNP (last 3 results) No results found for this basename: PROBNP,  in the last 8760 hours CBG:  Recent Labs Lab 11/26/13 1714 11/26/13 1835 11/26/13 1925 11/26/13 2219 11/27/13 0651  GLUCAP 328* 263* 240* 228* 139*    Recent Results (from the past 240 hour(s))  CULTURE, BLOOD (ROUTINE X 2)     Status: None   Collection Time    11/26/13  3:25 PM      Result Value Ref Range Status   Specimen Description BLOOD LEFT ARM   Final   Special Requests BOTTLES DRAWN AEROBIC AND ANAEROBIC 5CC   Final   Culture  Setup Time     Final   Value: 11/26/2013 20:33     Performed at [facility]   Culture     Final   Value:        BLOOD  CULTURE RECEIVED NO GROWTH TO DATE CULTURE WILL BE HELD FOR 5 DAYS BEFORE ISSUING A FINAL NEGATIVE REPORT     Performed at [facility]   Report Status PENDING   Incomplete     Studies: Dg Chest 2 View  11/26/2013   CLINICAL DATA:  Hyperglycemia.  Altered mental status.  EXAM: CHEST  2 VIEW  COMPARISON:  PA and lateral chest 12/20/2012.  FINDINGS: There is cardiomegaly but no pulmonary edema. Lungs are clear. No pneumothorax or pleural effusion. Thoracic spondylosis and degenerative change about the shoulders is noted.  IMPRESSION: Cardiomegaly without acute disease.   Electronically Signed   By: Inge Rise M.D.   On: 11/26/2013 16:36   Ct Head Wo  Contrast  11/26/2013   CLINICAL DATA:  HYPERGLYCEMIA ALTERED MENTAL STATUS  EXAM: CT HEAD WITHOUT CONTRAST  TECHNIQUE: Contiguous axial images were obtained from the base of the skull through the vertex without intravenous contrast.  COMPARISON:  CT HEAD W/O CM dated 10/10/2013  FINDINGS: No acute intracranial abnormality. Specifically, no hemorrhage, hydrocephalus, mass lesion, acute infarction, or significant intracranial injury. No acute calvarial abnormality. Global atrophy. Diffuse areas of low attenuation are appreciated within the subcortical, deep, and periventricular white matter regions. Punctate areas of low attenuation project within the superior aspect of the basal ganglia regions bilaterally. These areas are small punctate. The visualized paranasal sinuses are patent.  IMPRESSION: Findings consistent with chronic areas of the lacune infarction within the superior aspect of the basal ganglia regions. Findings consistent with chronic small vessel ischemic changes. No acute abnormalities identified.   Electronically Signed   By: Margaree Mackintosh M.D.   On: 11/26/2013 16:16    Scheduled Meds: . acidophilus  2 capsule Oral Daily  . buPROPion  150 mg Oral BID  . calcium-vitamin D  1 tablet Oral BID  . carvedilol  25 mg Oral BID WC  . dipyridamole-aspirin  1 capsule Oral BID  . DULoxetine  60 mg Oral Daily  . ezetimibe  10 mg Oral Daily  . ferrous fumarate  2 tablet Oral Daily  . heparin  5,000 Units Subcutaneous 3 times per day  . insulin aspart  0-15 Units Subcutaneous TID WC  . insulin aspart  0-5 Units Subcutaneous QHS  . insulin aspart  5 Units Subcutaneous TID WC  . insulin detemir  35 Units Subcutaneous BID  . levothyroxine  75 mcg Oral QAC breakfast  . memantine  5 mg Oral Daily  . multivitamin with minerals  1 tablet Oral Daily  . simvastatin  40 mg Oral QHS   Continuous Infusions: . sodium chloride 100 mL/hr at 11/27/13 8101    Principal Problem:   Acute renal  failure Active Problems:   HYPOTHYROIDISM   Type II or unspecified type diabetes mellitus with ophthalmic manifestations, uncontrolled   Altered mental status   HTN (hypertension)   CVA (cerebral infarction)   Dehydration   Dementia    Time spent: Kim MD [facility] Pager [phone]. If 7PM-7AM, please contact night-coverage at [email or web address], password [facility] 11/27/2013, 11:54 AM  LOS: 1 day

## 2013-11-28 DIAGNOSIS — E860 Dehydration: Secondary | ICD-10-CM

## 2013-11-28 DIAGNOSIS — N179 Acute kidney failure, unspecified: Secondary | ICD-10-CM | POA: Diagnosis present

## 2013-11-28 LAB — BASIC METABOLIC PANEL
BUN: 11 mg/dL (ref 6–23)
CHLORIDE: 104 meq/L (ref 96–112)
CO2: 24 mEq/L (ref 19–32)
Calcium: 8.7 mg/dL (ref 8.4–10.5)
Creatinine, Ser: 1 mg/dL (ref 0.50–1.10)
GFR calc Af Amer: 64 mL/min — ABNORMAL LOW (ref >90)
GFR calc non Af Amer: 55 mL/min — ABNORMAL LOW (ref >90)
Glucose, Bld: 180 mg/dL — ABNORMAL HIGH (ref 70–99)
Potassium: 4 mEq/L (ref 3.7–5.3)
Sodium: 140 mEq/L (ref 137–147)

## 2013-11-28 LAB — GLUCOSE, CAPILLARY
GLUCOSE-CAPILLARY: 167 mg/dL — AB (ref 70–99)
Glucose-Capillary: 143 mg/dL — ABNORMAL HIGH (ref 70–99)
Glucose-Capillary: 238 mg/dL — ABNORMAL HIGH (ref 70–99)
Glucose-Capillary: 196 mg/dL — ABNORMAL HIGH (ref 70–99)

## 2013-11-28 MED ORDER — AMLODIPINE BESYLATE 5 MG PO TABS
5.0000 mg | ORAL_TABLET | Freq: Every day | ORAL | Status: DC
  Administered 2013-11-28 – 2013-11-29 (×2): 5 mg via ORAL
  Filled 2013-11-28 (×3): qty 1

## 2013-11-28 MED ORDER — ATORVASTATIN CALCIUM 20 MG PO TABS
20.0000 mg | ORAL_TABLET | Freq: Every day | ORAL | Status: DC
  Filled 2013-11-28: qty 1

## 2013-11-28 MED ORDER — SODIUM CHLORIDE 0.9 % IV SOLN: INTRAVENOUS | Status: DC

## 2013-11-28 NOTE — Progress Notes (Signed)
Patient changed to inpatient- requiring IVF @ 75cc/hr

## 2013-11-28 NOTE — Progress Notes (Signed)
[facility] PROGRESS NOTE  Regina [name] UDJ:[id number] DOB: 11-Mar-1941 DOA: 11/26/2013 PCP: Delia Chimes, NP  Assessment/Plan: #1 acute encephalopathy Likely secondary to dehydration and hyperglycemia. Patient alert in order x3. Continue hydration with IV fluids. Follow.  #2 acute renal failure Likely secondary to prerenal azotemia. Improved with hydration. Continue to hold ACE inhibitor. Follow.  #3 poorly controlled diabetes mellitus CBGs have improved. Hemoglobin A1c = 9.6. Continue hydration. Continue Levemir and sliding scale insulin, meal coverage insulin. Follow.  #4 hypo-thyroidism Continue home regimen of Synthroid.  #5 dementia Stable. Continue home regimen of Namenda.  #6 hypertension ACE inhibitor on hold. Blood pressure stable. Continue Coreg.Will start low dose norvasc and follow.  7 history of CVA Continue Aggrenox.  #8 history of depression Continue Cymbalta and Wellbutrin.  #9 prophylaxis Heparin for DVT prophylaxis.  Code Status: DO NOT RESUSCITATE Family Communication: Updated patient no family at bedside. Disposition Plan: Skilled nursing facility when medically stable.   Consultants:  None  Procedures:  Chest x-ray 11/26/2013  CT head 11/26/2013  Antibiotics:  None  HPI/Subjective: Patient alert and oriented x3. Patient in no distress.  Objective: Filed Vitals:   11/28/13 0939  BP: 139/56  Pulse: 66  Temp: 97.9 F (36.6 C)  Resp: 20    Intake/Output Summary (Last 24 hours) at 11/28/13 1202 Last data filed at 11/28/13 5885  Gross per 24 hour  Intake   1080 ml  Output      0 ml  Net   1080 ml   Filed Weights   11/26/13 2030  Weight: 87.091 kg (192 lb)    Exam:   General:  NAD  Cardiovascular: RRR  Respiratory: CTAB  Abdomen: Soft, nontender, nondistended, positive bowel sounds.  Musculoskeletal: No clubbing cyanosis or edema.   Data Reviewed: Basic Metabolic Panel:  Recent Labs Lab 11/26/13 1545  11/27/13 0558 11/28/13 0525  NA 133* 137 140  K 4.2 4.0 4.0  CL 93* 100 104  CO2 25 26 24   GLUCOSE 410* 150* 180*  BUN 23 17 11   CREATININE 1.27* 1.00 1.00  CALCIUM 9.5 9.0 8.7   Liver Function Tests:  Recent Labs Lab 11/26/13 1545 11/27/13 0558  AST 49* 29  ALT 53* 37*  ALKPHOS 70 59  BILITOT 0.5 0.4  PROT 6.9 5.9*  ALBUMIN 3.6 3.0*   No results found for this basename: LIPASE, AMYLASE,  in the last 168 hours No results found for this basename: AMMONIA,  in the last 168 hours CBC:  Recent Labs Lab 11/26/13 1650 11/27/13 0558  WBC 8.3 6.3  NEUTROABS 5.9  --   HGB 12.8 12.6  HCT 37.3 36.5  MCV 85.9 86.9  PLT 149* 194   Cardiac Enzymes: No results found for this basename: CKTOTAL, CKMB, CKMBINDEX, TROPONINI,  in the last 168 hours BNP (last 3 results) No results found for this basename: PROBNP,  in the last 8760 hours CBG:  Recent Labs Lab 11/27/13 1153 11/27/13 1638 11/27/13 2117 11/28/13 0640 11/28/13 1135  GLUCAP 159* 161* 175* 167* 143*    Recent Results (from the past 240 hour(s))  CULTURE, BLOOD (ROUTINE X 2)     Status: None   Collection Time    11/26/13  3:20 PM      Result Value Ref Range Status   Specimen Description BLOOD LEFT ARM   Final   Special Requests BOTTLES DRAWN AEROBIC AND ANAEROBIC 3CC   Final   Culture  Setup Time     Final   Value:  11/27/2013 01:43     Performed at [facility]   Culture     Final   Value:        BLOOD CULTURE RECEIVED NO GROWTH TO DATE CULTURE WILL BE HELD FOR 5 DAYS BEFORE ISSUING A FINAL NEGATIVE REPORT     Performed at [facility]   Report Status PENDING   Incomplete  CULTURE, BLOOD (ROUTINE X 2)     Status: None   Collection Time    11/26/13  3:25 PM      Result Value Ref Range Status   Specimen Description BLOOD LEFT ARM   Final   Special Requests BOTTLES DRAWN AEROBIC AND ANAEROBIC 5CC   Final   Culture  Setup Time     Final   Value: 11/26/2013 20:33     Performed at [facility]   Culture     Final   Value:        BLOOD CULTURE RECEIVED NO GROWTH TO DATE CULTURE WILL BE HELD FOR 5 DAYS BEFORE ISSUING A FINAL NEGATIVE REPORT     Performed at [facility]   Report Status PENDING   Incomplete  URINE CULTURE     Status: None   Collection Time    11/26/13  5:11 PM      Result Value Ref Range Status   Specimen Description URINE, CATHETERIZED   Final   Special Requests NONE   Final   Culture  Setup Time     Final   Value: 11/27/2013 01:48     Performed at [place]     Final   Value: NO GROWTH     Performed at [facility]   Culture     Final   Value: NO GROWTH     Performed at [facility]   Report Status 11/27/2013 FINAL   Final     Studies: Dg Chest 2 View  11/26/2013   CLINICAL DATA:  Hyperglycemia.  Altered mental status.  EXAM: CHEST  2 VIEW  COMPARISON:  PA and lateral chest 12/20/2012.  FINDINGS: There is cardiomegaly but no pulmonary edema. Lungs are clear. No pneumothorax or pleural effusion. Thoracic spondylosis and degenerative change about the shoulders is noted.  IMPRESSION: Cardiomegaly without acute disease.   Electronically Signed   By: Inge Rise M.D.   On: 11/26/2013 16:36   Ct Head Wo Contrast  11/26/2013   CLINICAL DATA:  HYPERGLYCEMIA ALTERED MENTAL STATUS  EXAM: CT HEAD WITHOUT CONTRAST  TECHNIQUE: Contiguous axial images were obtained from the base of the skull through the vertex without intravenous contrast.  COMPARISON:  CT HEAD W/O CM dated 10/10/2013  FINDINGS: No acute intracranial abnormality. Specifically, no hemorrhage, hydrocephalus, mass lesion, acute infarction, or significant intracranial injury. No acute calvarial abnormality. Global atrophy. Diffuse areas of low attenuation are appreciated within the subcortical, deep, and periventricular white matter regions. Punctate areas of low attenuation project within the superior aspect of the basal ganglia regions  bilaterally. These areas are small punctate. The visualized paranasal sinuses are patent.  IMPRESSION: Findings consistent with chronic areas of the lacune infarction within the superior aspect of the basal ganglia regions. Findings consistent with chronic small vessel ischemic changes. No acute abnormalities identified.   Electronically Signed   By: Margaree Mackintosh M.D.   On: 11/26/2013 16:16    Scheduled Meds: . acidophilus  2 capsule Oral Daily  . buPROPion  150 mg Oral BID  .  calcium-vitamin D  1 tablet Oral BID  . carvedilol  25 mg Oral BID WC  . dipyridamole-aspirin  1 capsule Oral BID  . DULoxetine  60 mg Oral Daily  . ezetimibe  10 mg Oral Daily  . ferrous fumarate  2 tablet Oral Daily  . heparin  5,000 Units Subcutaneous 3 times per day  . insulin aspart  0-15 Units Subcutaneous TID WC  . insulin aspart  0-5 Units Subcutaneous QHS  . insulin aspart  5 Units Subcutaneous TID WC  . insulin detemir  35 Units Subcutaneous BID  . levothyroxine  75 mcg Oral QAC breakfast  . memantine  5 mg Oral Daily  . multivitamin with minerals  1 tablet Oral Daily  . simvastatin  40 mg Oral QHS   Continuous Infusions: . sodium chloride 75 mL/hr at 11/27/13 1815    Principal Problem:   Acute renal failure Active Problems:   HYPOTHYROIDISM   Type II or unspecified type diabetes mellitus with ophthalmic manifestations, uncontrolled   Altered mental status   HTN (hypertension)   CVA (cerebral infarction)   Dehydration   Dementia   ARF (acute renal failure)    Time spent: Purple Sage MD [facility] Pager [phone]. If 7PM-7AM, please contact night-coverage at [email or web address], password [facility] 11/28/2013, 12:02 PM  LOS: 2 days

## 2013-11-28 NOTE — Clinical Social Work Placement (Addendum)
Clinical Social Work Department CLINICAL SOCIAL WORK PLACEMENT NOTE 11/28/2013  Patient:  LILYONA, RICHNER  Account Number:  [id number] Admit date:  11/26/2013  Clinical Social Worker:  Raquel Sarna [place], LCSWA  Date/time:  11/28/2013 04:24 PM  Clinical Social Work is seeking post-discharge placement for this patient at the following level of care:   [place]   (*CSW will update this form in Epic as items are completed)   11/28/2013  Patient/family provided with [place] Department of Clinical Social Works list of facilities offering this level of care within the geographic area requested by the patient (or if unable, by the patients family).  11/28/2013  Patient/family informed of their freedom to choose among providers that offer the needed level of care, that participate in Medicare, Medicaid or managed care program needed by the patient, have an available bed and are willing to accept the patient.  11/28/2013  Patient/family informed of [facility] ownership interest in [facility], as well as of the fact that they are under no obligation to receive care at this facility.  PASARR submitted to EDS on  PASARR number received from [place] on   FL2 transmitted to all facilities in geographic area requested by pt/family on  11/28/2013 FL2 transmitted to all facilities within larger geographic area on   Patient informed that his/her managed care company has contracts with or will negotiate with  certain facilities, including the following:     Patient/family informed of bed offers received:  11/29/2013 Patient chooses bed at [facility] Physician recommends and patient chooses bed at    Patient to be transferred to  on  11/29/2013 Patient to be transferred to facility by [facility]  The following physician request were entered in Epic:   Additional Comments: PASARR already existed.  [name], [place] Social Worker [phone]

## 2013-11-28 NOTE — Clinical Social Work Psychosocial (Signed)
Clinical Social Work Department BRIEF PSYCHOSOCIAL ASSESSMENT 11/28/2013  Patient:  Regina [name], Regina [name]     Account Number:  [id number]     Admit date:  11/26/2013  Clinical Social Worker:  Donna Christen  Date/Time:  11/28/2013 04:18 PM  Referred by:  Physician  Date Referred:  11/28/2013 Referred for  SNF Placement   Other Referral:   none.   Interview type:  Family Other interview type:   CSW spoke to pt's daughter, Regina [name] [phone])    PSYCHOSOCIAL DATA Living Status:  ALONE Admitted from facility:   Level of care:   Primary support name:  Regina [name] Primary support relationship to patient:  CHILD, ADULT Degree of support available:   Strong support system, daughter states that she is just not able to care for her mother at this point in time.    CURRENT CONCERNS Current Concerns  Post-Acute Placement   Other Concerns:   none.    SOCIAL WORK ASSESSMENT / PLAN CSW spoke with pt's daughter, Regina [name], due to pt's disorientation. CSW explained CSW role at [facility], and PT/OT recommendations for SNF placement at time of discharge. Regina [name] stated that she was aware her mother was possibly going to need SNF and has already been in contact with admissions at [facility]. Per Regina [name], pt's "dementia" has gotten worse and Regina [name] is unable to care for pt at home. Regina [name] informed CSW that prior to admission to [facility], pt was living at home alone in the house next to Regina [name]. CSW explained to [name] that insurance would have to approve SNF placement prior to pt being able to discharge to SNF. Regina [name] aware and appreciative of CSW providing information.   Assessment/plan status:  Psychosocial Support/Ongoing Assessment of Needs Other assessment/ plan:   none.   Information/referral to community resources:   Pt's daughter wishes for pt to be placed at [facility].    PATIENTS/FAMILYS RESPONSE TO PLAN OF CARE: Regina [name] very open to SNF placement at  [facility] with the hopes of transitioning pt to long-term care after rehabilitation. Regina [name] stated that this was best for pt as she is unable to live alone and Regina [name] is unable to care for her at this time.       Regina [name], [place] Social Worker [phone]

## 2013-11-29 DIAGNOSIS — F0390 Unspecified dementia without behavioral disturbance: Secondary | ICD-10-CM

## 2013-11-29 DIAGNOSIS — E440 Moderate protein-calorie malnutrition: Secondary | ICD-10-CM | POA: Insufficient documentation

## 2013-11-29 LAB — GLUCOSE, CAPILLARY
GLUCOSE-CAPILLARY: 74 mg/dL (ref 70–99)
Glucose-Capillary: 125 mg/dL — ABNORMAL HIGH (ref 70–99)
Glucose-Capillary: 233 mg/dL — ABNORMAL HIGH (ref 70–99)

## 2013-11-29 MED ORDER — INSULIN ASPART 100 UNIT/ML ~~LOC~~ SOLN: 0.0000 [IU] | Freq: Three times a day (TID) | SUBCUTANEOUS | Status: DC

## 2013-11-29 MED ORDER — AMLODIPINE BESYLATE 5 MG PO TABS: 5.0000 mg | ORAL_TABLET | Freq: Every day | ORAL | Status: DC

## 2013-11-29 MED ORDER — ENSURE PUDDING PO PUDG: 1.0000 | Freq: Two times a day (BID) | ORAL | Status: DC | PRN

## 2013-11-29 MED ORDER — INSULIN ASPART 100 UNIT/ML ~~LOC~~ SOLN: 0.0000 [IU] | Freq: Every day | SUBCUTANEOUS | Status: DC

## 2013-11-29 MED ORDER — ATORVASTATIN CALCIUM 20 MG PO TABS: 20.0000 mg | ORAL_TABLET | Freq: Every day | ORAL | Status: DC

## 2013-11-29 NOTE — Discharge Summary (Signed)
Physician Discharge Summary  Regina [name] YCX:[id number] DOB: 17-Feb-1941 DOA: 11/26/2013  PCP: Delia Chimes, NP  Admit date: 11/26/2013 Discharge date: 11/29/2013  Time spent: 35 minutes  Recommendations for Outpatient Follow-up:  1. Monitor blood sugar  Discharge Diagnoses:  Principal Problem:   Acute renal failure Active Problems:   HYPOTHYROIDISM   Type II or unspecified type diabetes mellitus with ophthalmic manifestations, uncontrolled   Altered mental status   HTN (hypertension)   CVA (cerebral infarction)   Dehydration   Dementia   ARF (acute renal failure)   Malnutrition of moderate degree   Discharge Condition: improved  Diet recommendation: carb mod  Filed Weights   11/26/13 2030  Weight: 87.091 kg (192 lb)    History of present illness:  Regina [name] is a 73 y.o. female  With a hx of diabetes, dementia, and prior cva who presents to the ED with worsening MS changes. In the Ed, the patient was noted to have mildly elevated Cr. CT head was unremarkable for acute change. The patient was noted to have bs into the 500's, corrected with IVF. Overall, the patient's condition improved with IVF. Hospitalist was consulted for admission  Hospital Course:  acute encephalopathy  Likely secondary to dehydration and hyperglycemia. Patient alert in order x3. Continue hydration with IV fluids. Follow.   acute renal failure  Likely secondary to prerenal azotemia. Improved with hydration. Continue to hold ACE inhibitor. Follow.   poorly controlled diabetes mellitus  CBGs have improved. Hemoglobin A1c = 9.6. Continue hydration. Continue Levemir and sliding scale insulin, meal coverage insulin. Follow.   hypothyroidism  Continue home regimen of Synthroid.   dementia  Stable. Continue home regimen of Namenda.   hypertension  ACE inhibitor on hold. Blood pressure stable. Continue Coreg.Will start low dose norvasc and follow.   history of CVA  Continue Aggrenox.   history  of depression  Continue Cymbalta and Wellbutrin.   Consultants:  None  Procedures:  Chest x-ray 11/26/2013  CT head 11/26/2013   Discharge Exam: Filed Vitals:   11/29/13 0937  BP: 135/67  Pulse: 63  Temp: 98.2 F (36.8 C)  Resp: 18    General: A+Ox3, NAD Cardiovascular: rrr Respiratory: clear anterior  Discharge Instructions      Discharge Orders   Future Orders Complete By Expires   Diet - low sodium heart healthy  As directed    Diet Carb Modified  As directed    Discharge instructions  As directed    Comments:     Monitor blood sugars   Increase activity slowly  As directed        Medication List    STOP taking these medications       lisinopril 5 MG tablet  Commonly known as:  PRINIVIL,ZESTRIL     ranitidine 75 MG tablet  Commonly known as:  ZANTAC     simvastatin 40 MG tablet  Commonly known as:  ZOCOR      TAKE these medications       acidophilus Caps capsule  Take 2 capsules by mouth daily.     amLODipine 5 MG tablet  Commonly known as:  NORVASC  Take 1 tablet (5 mg total) by mouth daily.     atorvastatin 20 MG tablet  Commonly known as:  LIPITOR  Take 1 tablet (20 mg total) by mouth daily at 6 PM.     buPROPion 150 MG 12 hr tablet  Commonly known as:  WELLBUTRIN SR  Take 150 mg by  mouth 2 (two) times daily.     calcium-vitamin D 500-200 MG-UNIT per tablet  Commonly known as:  OSCAL WITH D  Take 1 tablet by mouth 2 (two) times daily.     carvedilol 25 MG tablet  Commonly known as:  COREG  Take 1 tablet (25 mg total) by mouth 2 (two) times daily with a meal.     dipyridamole-aspirin 200-25 MG per 12 hr capsule  Commonly known as:  AGGRENOX  Take 1 capsule by mouth 2 (two) times daily.     DULoxetine 60 MG capsule  Commonly known as:  CYMBALTA  Take 60 mg by mouth daily.     ezetimibe 10 MG tablet  Commonly known as:  ZETIA  Take 10 mg by mouth daily.     feeding supplement (ENSURE) Pudg  Take 1 Container by mouth 2  (two) times daily as needed (Please offer if meal completion is less than 75%).     ferrous fumarate 325 (106 FE) MG Tabs tablet  Commonly known as:  HEMOCYTE - 106 mg FE  Take 2 tablets by mouth daily.     Fish Oil 1000 MG Caps  Take 2,000 mg by mouth daily.     insulin aspart 100 UNIT/ML injection  Commonly known as:  novoLOG  Inject 5 Units into the skin 3 (three) times daily with meals. If blood sugar > 200.     insulin aspart 100 UNIT/ML injection  Commonly known as:  novoLOG  Inject 0-15 Units into the skin 3 (three) times daily with meals.     insulin aspart 100 UNIT/ML injection  Commonly known as:  novoLOG  Inject 0-5 Units into the skin at bedtime.     insulin detemir 100 UNIT/ML injection  Commonly known as:  LEVEMIR  Inject 35 Units into the skin 2 (two) times daily.     levothyroxine 75 MCG tablet  Commonly known as:  SYNTHROID, LEVOTHROID  Take 1 tablet (75 mcg total) by mouth daily before breakfast.     multivitamin with minerals Tabs tablet  Take 1 tablet by mouth daily.     NAMENDA XR 7 MG Cp24  Generic drug:  Memantine HCl ER  Take 7 mg by mouth daily.     Travoprost (BAK Free) 0.004 % Soln ophthalmic solution  Commonly known as:  TRAVATAN  Place 1 drop into both eyes at bedtime.       Allergies  Allergen Reactions  . Amoxicillin     REACTION: rash  . Ciprofloxacin     REACTION: hives  . Codeine     REACTION: nervous, shaky  . Duloxetine     REACTION: reaction not known  . Hydrochlorothiazide W-Triamterene     REACTION: hyponatremia  . Ticlopidine Hcl     REACTION: bleeding      The results of significant diagnostics from this hospitalization (including imaging, microbiology, ancillary and laboratory) are listed below for reference.    Significant Diagnostic Studies: Dg Chest 2 View  11/26/2013   CLINICAL DATA:  Hyperglycemia.  Altered mental status.  EXAM: CHEST  2 VIEW  COMPARISON:  PA and lateral chest 12/20/2012.  FINDINGS: There  is cardiomegaly but no pulmonary edema. Lungs are clear. No pneumothorax or pleural effusion. Thoracic spondylosis and degenerative change about the shoulders is noted.  IMPRESSION: Cardiomegaly without acute disease.   Electronically Signed   By: Inge Rise M.D.   On: 11/26/2013 16:36   Ct Head Wo Contrast  11/26/2013  CLINICAL DATA:  HYPERGLYCEMIA ALTERED MENTAL STATUS  EXAM: CT HEAD WITHOUT CONTRAST  TECHNIQUE: Contiguous axial images were obtained from the base of the skull through the vertex without intravenous contrast.  COMPARISON:  CT HEAD W/O CM dated 10/10/2013  FINDINGS: No acute intracranial abnormality. Specifically, no hemorrhage, hydrocephalus, mass lesion, acute infarction, or significant intracranial injury. No acute calvarial abnormality. Global atrophy. Diffuse areas of low attenuation are appreciated within the subcortical, deep, and periventricular white matter regions. Punctate areas of low attenuation project within the superior aspect of the basal ganglia regions bilaterally. These areas are small punctate. The visualized paranasal sinuses are patent.  IMPRESSION: Findings consistent with chronic areas of the lacune infarction within the superior aspect of the basal ganglia regions. Findings consistent with chronic small vessel ischemic changes. No acute abnormalities identified.   Electronically Signed   By: Margaree Mackintosh M.D.   On: 11/26/2013 16:16    Microbiology: Recent Results (from the past 240 hour(s))  CULTURE, BLOOD (ROUTINE X 2)     Status: None   Collection Time    11/26/13  3:20 PM      Result Value Ref Range Status   Specimen Description BLOOD LEFT ARM   Final   Special Requests BOTTLES DRAWN AEROBIC AND ANAEROBIC 3CC   Final   Culture  Setup Time     Final   Value: 11/27/2013 01:43     Performed at [facility]   Culture     Final   Value:        BLOOD CULTURE RECEIVED NO GROWTH TO DATE CULTURE WILL BE HELD FOR 5 DAYS BEFORE ISSUING A FINAL  NEGATIVE REPORT     Performed at [facility]   Report Status PENDING   Incomplete  CULTURE, BLOOD (ROUTINE X 2)     Status: None   Collection Time    11/26/13  3:25 PM      Result Value Ref Range Status   Specimen Description BLOOD LEFT ARM   Final   Special Requests BOTTLES DRAWN AEROBIC AND ANAEROBIC 5CC   Final   Culture  Setup Time     Final   Value: 11/26/2013 20:33     Performed at [facility]   Culture     Final   Value:        BLOOD CULTURE RECEIVED NO GROWTH TO DATE CULTURE WILL BE HELD FOR 5 DAYS BEFORE ISSUING A FINAL NEGATIVE REPORT     Performed at [facility]   Report Status PENDING   Incomplete  URINE CULTURE     Status: None   Collection Time    11/26/13  5:11 PM      Result Value Ref Range Status   Specimen Description URINE, CATHETERIZED   Final   Special Requests NONE   Final   Culture  Setup Time     Final   Value: 11/27/2013 01:48     Performed at [place]     Final   Value: NO GROWTH     Performed at [facility]   Culture     Final   Value: NO GROWTH     Performed at [facility]   Report Status 11/27/2013 FINAL   Final     Labs: Basic Metabolic Panel:  Recent Labs Lab 11/26/13 1545 11/27/13 0558 11/28/13 0525  NA 133* 137 140  K 4.2 4.0 4.0  CL 93* 100 104  CO2 25 26 24   GLUCOSE 410* 150* 180*  BUN 23 17 11   CREATININE 1.27* 1.00 1.00  CALCIUM 9.5 9.0 8.7   Liver Function Tests:  Recent Labs Lab 11/26/13 1545 11/27/13 0558  AST 49* 29  ALT 53* 37*  ALKPHOS 70 59  BILITOT 0.5 0.4  PROT 6.9 5.9*  ALBUMIN 3.6 3.0*   No results found for this basename: LIPASE, AMYLASE,  in the last 168 hours No results found for this basename: AMMONIA,  in the last 168 hours CBC:  Recent Labs Lab 11/26/13 1650 11/27/13 0558  WBC 8.3 6.3  NEUTROABS 5.9  --   HGB 12.8 12.6  HCT 37.3 36.5  MCV 85.9 86.9  PLT 149* 194   Cardiac Enzymes: No results found for this  basename: CKTOTAL, CKMB, CKMBINDEX, TROPONINI,  in the last 168 hours BNP: BNP (last 3 results) No results found for this basename: PROBNP,  in the last 8760 hours CBG:  Recent Labs Lab 11/28/13 0640 11/28/13 1135 11/28/13 1703 11/28/13 2116 11/29/13 0655  GLUCAP 167* 143* 238* 196* 74       Signed:  Exavior Kimmons  [facility] 11/29/2013, 10:15 AM

## 2013-11-29 NOTE — Progress Notes (Signed)
Transported from room per stretcher with ems, no obvious acute distress. Baseline orientation noted. Report calle d to [place]. Iv access removed.

## 2013-11-29 NOTE — Clinical Social Work Note (Signed)
CSW spoke with pt's daughter [name] and confirmed discharge to [facility] on 11/29/2013. CSW received insurance authorization from [facility] [id number]) and CSW has called [facility] to provide them with insurance authorization. CSW has completed the discharge packet and placed on pt's shadow chart. CSW has faxed discharge summary to [facility]. CSW to assist with transportation from [facility] to [facility].  RN please call report to [facility] at [phone]  Ambulance ([place]) at [facility] [place] Worker [phone]

## 2013-12-02 LAB — CULTURE, BLOOD (ROUTINE X 2): Culture: NO GROWTH

## 2013-12-03 LAB — CULTURE, BLOOD (ROUTINE X 2): CULTURE: NO GROWTH

## 2013-12-20 ENCOUNTER — Emergency Department (HOSPITAL_COMMUNITY)
Admission: EM | Admit: 2013-12-20 | Discharge: 2013-12-20 | Disposition: A | Discharge status: Home / Self Care | Payer: Medicare Other | Attending: Emergency Medicine | Admitting: Emergency Medicine

## 2013-12-20 ENCOUNTER — Encounter (HOSPITAL_COMMUNITY): Payer: Self-pay | Admitting: Emergency Medicine

## 2013-12-20 ENCOUNTER — Emergency Department (HOSPITAL_COMMUNITY): Payer: Medicare Other

## 2013-12-20 DIAGNOSIS — Y9389 Activity, other specified: Secondary | ICD-10-CM | POA: Diagnosis not present

## 2013-12-20 DIAGNOSIS — E785 Hyperlipidemia, unspecified: Secondary | ICD-10-CM | POA: Insufficient documentation

## 2013-12-20 DIAGNOSIS — E119 Type 2 diabetes mellitus without complications: Secondary | ICD-10-CM | POA: Diagnosis not present

## 2013-12-20 DIAGNOSIS — D649 Anemia, unspecified: Secondary | ICD-10-CM | POA: Insufficient documentation

## 2013-12-20 DIAGNOSIS — W010XXA Fall on same level from slipping, tripping and stumbling without subsequent striking against object, initial encounter: Secondary | ICD-10-CM | POA: Insufficient documentation

## 2013-12-20 DIAGNOSIS — S42213A Unspecified displaced fracture of surgical neck of unspecified humerus, initial encounter for closed fracture: Secondary | ICD-10-CM | POA: Insufficient documentation

## 2013-12-20 DIAGNOSIS — I359 Nonrheumatic aortic valve disorder, unspecified: Secondary | ICD-10-CM | POA: Insufficient documentation

## 2013-12-20 DIAGNOSIS — Y9210 Unspecified residential institution as the place of occurrence of the external cause: Secondary | ICD-10-CM | POA: Diagnosis not present

## 2013-12-20 DIAGNOSIS — E039 Hypothyroidism, unspecified: Secondary | ICD-10-CM | POA: Diagnosis not present

## 2013-12-20 DIAGNOSIS — S4980XA Other specified injuries of shoulder and upper arm, unspecified arm, initial encounter: Secondary | ICD-10-CM | POA: Diagnosis present

## 2013-12-20 DIAGNOSIS — Z794 Long term (current) use of insulin: Secondary | ICD-10-CM | POA: Insufficient documentation

## 2013-12-20 DIAGNOSIS — H548 Legal blindness, as defined in USA: Secondary | ICD-10-CM | POA: Insufficient documentation

## 2013-12-20 DIAGNOSIS — Z79899 Other long term (current) drug therapy: Secondary | ICD-10-CM | POA: Insufficient documentation

## 2013-12-20 DIAGNOSIS — R61 Generalized hyperhidrosis: Secondary | ICD-10-CM | POA: Insufficient documentation

## 2013-12-20 DIAGNOSIS — F329 Major depressive disorder, single episode, unspecified: Secondary | ICD-10-CM | POA: Insufficient documentation

## 2013-12-20 DIAGNOSIS — F3289 Other specified depressive episodes: Secondary | ICD-10-CM | POA: Insufficient documentation

## 2013-12-20 DIAGNOSIS — I10 Essential (primary) hypertension: Secondary | ICD-10-CM | POA: Diagnosis not present

## 2013-12-20 DIAGNOSIS — R112 Nausea with vomiting, unspecified: Secondary | ICD-10-CM | POA: Diagnosis not present

## 2013-12-20 DIAGNOSIS — K219 Gastro-esophageal reflux disease without esophagitis: Secondary | ICD-10-CM | POA: Diagnosis not present

## 2013-12-20 DIAGNOSIS — Z87891 Personal history of nicotine dependence: Secondary | ICD-10-CM | POA: Diagnosis not present

## 2013-12-20 DIAGNOSIS — Z8673 Personal history of transient ischemic attack (TIA), and cerebral infarction without residual deficits: Secondary | ICD-10-CM | POA: Diagnosis not present

## 2013-12-20 DIAGNOSIS — M129 Arthropathy, unspecified: Secondary | ICD-10-CM | POA: Insufficient documentation

## 2013-12-20 DIAGNOSIS — S46909A Unspecified injury of unspecified muscle, fascia and tendon at shoulder and upper arm level, unspecified arm, initial encounter: Secondary | ICD-10-CM | POA: Diagnosis present

## 2013-12-20 DIAGNOSIS — W19XXXA Unspecified fall, initial encounter: Secondary | ICD-10-CM

## 2013-12-20 LAB — CBC WITH DIFFERENTIAL/PLATELET
Basophils Absolute: 0 10*3/uL (ref 0.0–0.1)
Basophils Relative: 0 % (ref 0–1)
EOS ABS: 0 10*3/uL (ref 0.0–0.7)
EOS PCT: 0 % (ref 0–5)
HEMATOCRIT: 43.7 % (ref 36.0–46.0)
HEMOGLOBIN: 15.1 g/dL — AB (ref 12.0–15.0)
Lymphocytes Relative: 11 % — ABNORMAL LOW (ref 12–46)
Lymphs Abs: 1.7 10*3/uL (ref 0.7–4.0)
MCH: 30 pg (ref 26.0–34.0)
MCHC: 34.6 g/dL (ref 30.0–36.0)
MCV: 86.9 fL (ref 78.0–100.0)
MONOS PCT: 6 % (ref 3–12)
Monocytes Absolute: 0.9 10*3/uL (ref 0.1–1.0)
Neutro Abs: 13.5 10*3/uL — ABNORMAL HIGH (ref 1.7–7.7)
Neutrophils Relative %: 84 % — ABNORMAL HIGH (ref 43–77)
Platelets: 184 10*3/uL (ref 150–400)
RBC: 5.03 MIL/uL (ref 3.87–5.11)
RDW: 13 % (ref 11.5–15.5)
WBC: 16.2 10*3/uL — ABNORMAL HIGH (ref 4.0–10.5)

## 2013-12-20 LAB — BASIC METABOLIC PANEL
BUN: 15 mg/dL (ref 6–23)
CALCIUM: 9.8 mg/dL (ref 8.4–10.5)
CO2: 21 mEq/L (ref 19–32)
Chloride: 98 mEq/L (ref 96–112)
Creatinine, Ser: 1.25 mg/dL — ABNORMAL HIGH (ref 0.50–1.10)
GFR calc Af Amer: 49 mL/min — ABNORMAL LOW (ref >90)
GFR, EST NON AFRICAN AMERICAN: 42 mL/min — AB (ref >90)
GLUCOSE: 240 mg/dL — AB (ref 70–99)
POTASSIUM: 5 meq/L (ref 3.7–5.3)
Sodium: 140 mEq/L (ref 137–147)

## 2013-12-20 LAB — TROPONIN I: Troponin I: <0.3 ng/mL (ref <0.30)

## 2013-12-20 LAB — CBG MONITORING, ED: Glucose-Capillary: 212 mg/dL — ABNORMAL HIGH (ref 70–99)

## 2013-12-20 MED ORDER — HYDROCODONE-ACETAMINOPHEN 5-325 MG PO TABS: 1.0000 | ORAL_TABLET | ORAL | Status: DC | PRN

## 2013-12-20 MED ORDER — FENTANYL CITRATE 0.05 MG/ML IJ SOLN
50.0000 ug | Freq: Once | INTRAMUSCULAR | Status: AC | PRN
  Administered 2013-12-20: 50 ug via INTRAVENOUS
  Filled 2013-12-20: qty 2

## 2013-12-20 NOTE — Discharge Instructions (Signed)
Recommend you followup with your orthopedist for further evaluation of your fracture. You may take Norco as prescribed for pain. Wear a sling for immobilization. Return if symptoms worsen.  Humerus Fracture, Treated with Immobilization The humerus is the large bone in your upper arm. You have a broken (fractured) humerus. These fractures are easily diagnosed with X-rays. TREATMENT  Simple fractures which will heal without disability are treated with simple immobilization. Immobilization means you will wear a cast, splint, or sling. You have a fracture which will do well with immobilization. The fracture will heal well simply by being held in a good position until it is stable enough to begin range of motion exercises. Do not take part in activities which would further injure your arm.  HOME CARE INSTRUCTIONS   Put ice on the injured area.  Put ice in a plastic bag.  Place a towel between your skin and the bag.  Leave the ice on for 15-20 minutes, 03-04 times a day.  If you have a cast:  Do not scratch the skin under the cast using sharp or pointed objects.  Check the skin around the cast every day. You may put lotion on any red or sore areas.  Keep your cast dry and clean.  If you have a splint:  Wear the splint as directed.  Keep your splint dry and clean.  You may loosen the elastic around the splint if your fingers become numb, tingle, or turn cold or blue.  If you have a sling:  Wear the sling as directed.  Do not put pressure on any part of your cast or splint until it is fully hardened.  Your cast or splint can be protected during bathing with a plastic bag. Do not lower the cast or splint into water.  Only take over-the-counter or prescription medicines for pain, discomfort, or fever as directed by your caregiver.  Do range of motion exercises as instructed by your caregiver.  Follow up as directed by your caregiver. This is very important in order to avoid  permanent injury or disability and chronic pain. SEEK IMMEDIATE MEDICAL CARE IF:   Your skin or nails in the injured arm turn blue or gray.  Your arm feels cold or numb.  You develop severe pain in the injured arm.  You are having problems with the medicines you were given. MAKE SURE YOU:   Understand these instructions.  Will watch your condition.  Will get help right away if you are not doing well or get worse. Document Released: 01/04/2001 Document Revised: 12/21/2011 Document Reviewed: 11/12/2010 [facility] Patient Information 2014 [place].

## 2013-12-20 NOTE — ED Notes (Signed)
Per EMS pt from nursing home with c/o witnessed fall, per EMS pt caught herself o nher hands and is now c/o R arm pain. Pt given 50 mcg fentanyl and 4 mg zofran IV enroute. Pt is in NAD, asleep upon arrival to ED

## 2013-12-20 NOTE — ED Notes (Signed)
Bed: WA09 Expected date:  Expected time:  Means of arrival:  Comments: EMS- fall, shoulder/arm pain

## 2013-12-20 NOTE — Progress Notes (Signed)
Clinical Social Work Department BRIEF PSYCHOSOCIAL ASSESSMENT 12/20/2013  Patient:  Regina [name], Regina [name]     Account Number:  [id number]     Admit date:  12/20/2013  Clinical Social Worker:  Luretha Rued  Date/Time:  12/20/2013 06:00 PM  Referred by:  CSW  Date Referred:  12/20/2013 Referred for  SNF Placement   Other Referral:   Interview type:  Patient Other interview type:   Daughter at bedside.    PSYCHOSOCIAL DATA Living Status:  FACILITY Admitted from facility:  [facility] Level of care:  [place] Primary support name:  Regina [name] Lapping Primary support relationship to patient:  CHILD, ADULT Degree of support available:   high level of support as evidenced by family at bedside.    CURRENT CONCERNS  Other Concerns:    SOCIAL WORK ASSESSMENT / PLAN CSW met with patient and daughter at bedside to complete this assessment.  CSW attempted to engage in communication with the patient but she would use head gestures.  At this time the patient and family intends for her to return to [facility] facility to continue care.  Patient confirms her daughter is POA.   Assessment/plan status:  Information/Referral to [employer] Other assessment/ plan:   Information/referral to community resources:   Patient discharged and returned to SNF    PATIENT'S/FAMILY'S RESPONSE TO PLAN OF CARE: Patient and family was appreicative of the support from the social work department.       Chesley Noon, MSW, [place], 12/20/2013 Evening Clinical Social Worker [phone]

## 2013-12-20 NOTE — ED Provider Notes (Signed)
CSN: [id number]     Arrival date & time 12/20/13  1416 History   First MD Initiated Contact with Patient 12/20/13 1505     Chief Complaint  Patient presents with  . Fall  . Arm Pain    (Consider location/radiation/quality/duration/timing/severity/associated sxs/prior Treatment) HPI Comments: Level 5 caveat applies secondary to dementia.  Patient is a 73 year old female with a history of type 2 diabetes, hyperlipidemia, hypertension, TIA, and dementia who presents to the emergency department for right arm pain following a fall. Per nursing home, nurse was in patient's room with her when she fell while walking with a walker. Nurse endorses call to be mechanical in nature. Daughter states that patient often "fumbles around" when walking and she is blind in both eyes. Nurse endorses patient to have fallen forward on outstretched bilateral hands without head trauma or LOC. Daughter states, however, that patient never falls this way and usually falls on her back or side. Patient c/o pain to her R arm extending from her R shoulder to her R forearm, with most pain at R humerus. Patient states movement and palpation makes pain worse and rest and Fentanyl improved pain. Patient was noted to have associated diaphoresis and 1 episode of NB/NB emesis after getting up from her fall. Patient denies headache, CP, SOB, numbness/tingling, and weakness. Patient and daughter deny use of blood thinners.  Patient is a 73 y.o. female presenting with fall and arm pain. The history is provided by the patient, a relative, the EMS personnel and the nursing home. No language interpreter was used.  Fall Associated symptoms include arthralgias, diaphoresis, myalgias, nausea and vomiting. Pertinent negatives include no abdominal pain, chest pain, fever, numbness or weakness.  Arm Pain Associated symptoms include arthralgias, diaphoresis, myalgias, nausea and vomiting. Pertinent negatives include no abdominal pain, chest pain,  fever, numbness or weakness.    Past Medical History  Diagnosis Date  . Hypertension   . Hyperlipidemia   . TIA (transient ischemic attack) 1990's    "mini strokes" (06/07/2013)  . Legally blind     "both eyes; some vision in the right' (06/07/2013)  . Heart murmur 1960's  . Hypothyroidism   . Type II diabetes mellitus   . Anemia   . GERD (gastroesophageal reflux disease)   . Arthritis     "left leg" (06/07/2013)  . Depression   . Vulva cancer   . Altered mental status     "the first time I noticed any problem was 2 d ago" (06/07/2013)  . Aortic stenosis     mod-severe   Past Surgical History  Procedure Laterality Date  . Femur fracture surgery Left 1999  . Cesarean section  1968  . Tubal ligation  1970's  . Vulva surgery  1990's    "cut a big chunk out for cancer" (06/07/2013)  . Transthoracic echocardiogram  02/2013    EF 55-60%, mod conc hypertrophy, grade 1 diastolic dysfunction; AV with mod stenosis; calcified MV annulus & mild MR; LA severely dilated; PA peak pressure 68mmHg   Family History  Problem Relation Age of Onset  . Hypertension Mother   . Hyperlipidemia Mother   . Hyperlipidemia Father   . Hypertension Father   . Diabetes Father    History  Substance Use Topics  . Smoking status: Former Smoker -- 4.00 packs/day for 30 years    Types: Cigarettes    Quit date: 09/14/1987  . Smokeless tobacco: Never Used     Comment: 06/07/2013 "hasn't smoked since her stroke  in the 1990's"  . Alcohol Use: No   OB History   Grav Para Term Preterm Abortions TAB SAB Ect Mult Living                 Review of Systems  Constitutional: Positive for diaphoresis. Negative for fever.  Respiratory: Negative for shortness of breath.   Cardiovascular: Negative for chest pain.  Gastrointestinal: Positive for nausea and vomiting. Negative for abdominal pain.  Musculoskeletal: Positive for arthralgias and myalgias.  Neurological: Negative for syncope, weakness and numbness.  All  other systems reviewed and are negative.      Allergies  Amoxicillin; Ciprofloxacin; Codeine; Duloxetine; Hydrochlorothiazide w-triamterene; and Ticlopidine hcl  Home Medications   Current Outpatient Rx  Name  Route  Sig  Dispense  Refill  . amLODipine (NORVASC) 5 MG tablet   Oral   Take 1 tablet (5 mg total) by mouth daily.         Marland Kitchen atorvastatin (LIPITOR) 20 MG tablet   Oral   Take 20 mg by mouth at bedtime.         Marland Kitchen buPROPion (WELLBUTRIN SR) 150 MG 12 hr tablet   Oral   Take 150 mg by mouth 2 (two) times daily.         . calcium-vitamin D (OSCAL WITH D) 500-200 MG-UNIT per tablet   Oral   Take 1 tablet by mouth 2 (two) times daily.         . carvedilol (COREG) 25 MG tablet   Oral   Take 1 tablet (25 mg total) by mouth 2 (two) times daily with a meal.   60 tablet   0   . dipyridamole-aspirin (AGGRENOX) 200-25 MG per 12 hr capsule   Oral   Take 1 capsule by mouth 2 (two) times daily.         . DULoxetine (CYMBALTA) 60 MG capsule   Oral   Take 60 mg by mouth daily.         Marland Kitchen ezetimibe (ZETIA) 10 MG tablet   Oral   Take 10 mg by mouth at bedtime.          . feeding supplement, ENSURE, (ENSURE) PUDG   Oral   Take 1 Container by mouth 2 (two) times daily as needed (Please offer if meal completion is less than 75%).      0   . ferrous fumarate (HEMOCYTE - 106 MG FE) 325 (106 FE) MG TABS tablet   Oral   Take 2 tablets by mouth daily.          . insulin aspart (NOVOLOG) 100 UNIT/ML injection   Subcutaneous   Inject 0-5 Units into the skin 3 (three) times daily before meals. Per sliding scale : (0-200 bs = 0 units) ([phone] bs = 5 units)         . insulin detemir (LEVEMIR) 100 UNIT/ML injection   Subcutaneous   Inject 35 Units into the skin 2 (two) times daily.         Marland Kitchen levothyroxine (SYNTHROID, LEVOTHROID) 75 MCG tablet   Oral   Take 1 tablet (75 mcg total) by mouth daily before breakfast.   30 tablet   0   . Memantine HCl ER  (NAMENDA XR) 7 MG CP24   Oral   Take 7 mg by mouth at bedtime.          . Multiple Vitamin (MULTIVITAMIN WITH MINERALS) TABS tablet   Oral   Take 1 tablet by mouth  daily.         . Omega-3 Fatty Acids (FISH OIL) 1000 MG CAPS   Oral   Take 2,000 mg by mouth daily.         Marland Kitchen saccharomyces boulardii (FLORASTOR) 250 MG capsule   Oral   Take 500 mg by mouth daily.         . Travoprost, BAK Free, (TRAVATAN) 0.004 % SOLN ophthalmic solution   Both Eyes   Place 1 drop into both eyes at bedtime.         Marland Kitchen tuberculin (TUBERSOL) 5 UNIT/0.1ML injection   Intradermal   Inject 0.1 mLs into the skin See admin instructions. Given every 10 days         . HYDROcodone-acetaminophen (NORCO/VICODIN) 5-325 MG per tablet   Oral   Take 1 tablet by mouth every 4 (four) hours as needed.   17 tablet   0    BP 141/75  Pulse 65  Temp(Src) 98.3 F (36.8 C) (Oral)  Resp 18  SpO2 96%  Physical Exam  Nursing note and vitals reviewed. Constitutional: She is oriented to person, place, and time. She appears well-developed and well-nourished. No distress.  Patient answers questions appropriately and follows simple commands. She is in NAD.  HENT:  Head: Normocephalic and atraumatic.  Eyes: Conjunctivae and EOM are normal. Pupils are equal, round, and reactive to light. No scleral icterus.  Neck: Normal range of motion.  Cardiovascular: Normal rate, regular rhythm and intact distal pulses.   Murmur (II/VI SEM) heard. Pulmonary/Chest: Effort normal and breath sounds normal. No respiratory distress. She has no wheezes. She has no rales.  Chest expansion symmetric.  Abdominal: Soft. She exhibits no distension. There is no tenderness. There is no rebound and no guarding.  Musculoskeletal:       Right shoulder: She exhibits decreased range of motion (secondary to pain), tenderness and pain. She exhibits no bony tenderness, no swelling, no deformity and normal pulse.       Right elbow: She  exhibits decreased range of motion (secondary to pain). She exhibits no swelling, no effusion and no deformity. Tenderness found.       Right wrist: Normal.       Cervical back: Normal.       Right upper arm: She exhibits tenderness and bony tenderness. She exhibits no swelling and no deformity.       Right forearm: She exhibits tenderness. She exhibits no bony tenderness, no swelling, no edema and no deformity.       Right hand: Normal.  Neurological: She is alert and oriented to person, place, and time. No cranial nerve deficit or sensory deficit. GCS eye subscore is 4. GCS verbal subscore is 5. GCS motor subscore is 6.  GCS 15. Speech is goal oriented. No focal neurologic deficits appreciated. No gross sensory deficits appreciated. Patient able to wiggle all fingers and make "OK" and "thumbs up" sign with R hand. Finger to thumb opposition intact in RUE.  Skin: Skin is warm and dry. No rash noted. She is not diaphoretic. No erythema. No pallor.  Psychiatric: She has a normal mood and affect. Her behavior is normal.    ED Course  Procedures (including critical care time) Labs Review Labs Reviewed  CBC WITH DIFFERENTIAL - Abnormal; Notable for the following:    WBC 16.2 (*)    Hemoglobin 15.1 (*)    Neutrophils Relative % 84 (*)    Neutro Abs 13.5 (*)    Lymphocytes Relative  11 (*)    All other components within normal limits  BASIC METABOLIC PANEL - Abnormal; Notable for the following:    Glucose, Bld 240 (*)    Creatinine, Ser 1.25 (*)    GFR calc non Af Amer 42 (*)    GFR calc Af Amer 49 (*)    All other components within normal limits  CBG MONITORING, ED - Abnormal; Notable for the following:    Glucose-Capillary 212 (*)    All other components within normal limits  TROPONIN I   Imaging Review Dg Chest 1 View  12/20/2013   CLINICAL DATA Fall.  Right humeral fracture  EXAM CHEST - 1 VIEW  COMPARISON 11/26/2013  FINDINGS Right humeral neck fracture  Cardiac enlargement without  heart failure. Mild bibasilar atelectasis. No effusion  IMPRESSION Mild bibasilar atelectasis.  SIGNATURE  Electronically Signed   By: Franchot Gallo M.D.   On: 12/20/2013 16:17   Dg Shoulder Right  12/20/2013   CLINICAL DATA Fall  EXAM RIGHT SHOULDER - 2+ VIEW  COMPARISON None.  FINDINGS Fracture of the humeral neck with mild displacement.  No dislocation  Degenerative change and spurring in the [facility] joint.  IMPRESSION Fracture of the right humeral neck.  SIGNATURE  Electronically Signed   By: Franchot Gallo M.D.   On: 12/20/2013 16:13   Dg Elbow Complete Right  12/20/2013   CLINICAL DATA Fall  EXAM RIGHT ELBOW - COMPLETE 3+ VIEW  COMPARISON none  FINDINGS Limited views due to humeral neck fracture. Lateral view was not obtained. No fracture seen on the AP and oblique view. There is soft tissue calcification medial and lateral to the distal humerus due to chronic tendinitis.  IMPRESSION Limited study.  No fracture identified  SIGNATURE  Electronically Signed   By: Franchot Gallo M.D.   On: 12/20/2013 16:15   Dg Forearm Right  12/20/2013   CLINICAL DATA Fall.  EXAM RIGHT FOREARM - 2 VIEW  COMPARISON None.  FINDINGS Negative for fracture.  Degenerative change in the wrist joint.  IMPRESSION Negative for fracture.  SIGNATURE  Electronically Signed   By: Franchot Gallo M.D.   On: 12/20/2013 16:16   Dg Humerus Right  12/20/2013   CLINICAL DATA Fall  EXAM RIGHT HUMERUS - 2+ VIEW  COMPARISON None.  FINDINGS Fracture of the humeral neck. Greater tuberosity also fractured. Mild displacement.  Degenerative change in the [facility] joint.  No additional fractures are identified.  IMPRESSION Fracture of the right humeral neck.  SIGNATURE  Electronically Signed   By: Franchot Gallo M.D.   On: 12/20/2013 16:13     EKG Interpretation   Date/Time:  Wednesday December 20 2013 15:32:06 EDT Ventricular Rate:  68 PR Interval:  211 QRS Duration: 95 QT Interval:  443 QTC Calculation: 471 R Axis:   -110 Text Interpretation:   Sinus rhythm Probable lateral infarct, age  indeterminate new T wave inversion Lateral leads Confirmed by Maryan Rued   MD, Loree Fee ([id number]) on 12/20/2013 3:40:11 PM      MDM   Final diagnoses:  Fracture, humerus, neck  Fall    73 year old female presents from a nursing home after a witnessed fall. No head trauma or LOC reported. Patient was noted to have an episode of diaphoresis and nonbloody emesis x1 after fall. Complaint today of right upper extremity pain, mostly at proximal humerus. Patient neurovascularly intact on physical exam. She has no focal neurologic deficits today. Daughter endorses patient to be at baseline neurologically. She does have  a history of dementia.  X-ray today significant for fracture of right humeral neck. Acute fracture likely the cause of patient's leukocytosis today. EKG and troponin obtained in light of patient's diaphoresis and emesis. Her EKG today shows no T wave inversion in lateral leads. Troponin negative. Patient's no complaints of chest pain or shortness of breath. Patient case discussed with Dr. Maryan Rued; do not believe that EKG changes today represent ACS in this patient.  Have consulted with Dr. Marcelino Scot regarding imaging findings. He has reviewed the x-rays and agrees that patient is stable for outpatient followup with sling immobilization. Patient's pain well controlled with immobilization and fentanyl in ED today. Will d/c with Rx for Norco for pain control. Return precautions discussed with the patient and her daughter both of whom verbalize comfort with plan.   Filed Vitals:   12/20/13 1418 12/20/13 1503  BP:  141/75  Pulse:  65  Temp:  98.3 F (36.8 C)  TempSrc:  Oral  Resp:  18  SpO2: 95% 96%       Antonietta Breach, PA-C 12/20/13 1807

## 2013-12-20 NOTE — ED Provider Notes (Signed)
Medical screening examination/treatment/procedure(s) were conducted as a shared visit with non-physician practitioner(s) and myself.  I personally evaluated the patient during the encounter.   EKG Interpretation   Date/Time:  Wednesday December 20 2013 15:32:06 EDT Ventricular Rate:  68 PR Interval:  211 QRS Duration: 95 QT Interval:  443 QTC Calculation: 471 R Axis:   -110 Text Interpretation:  Sinus rhythm Probable lateral infarct, age  indeterminate new T wave inversion Lateral leads Confirmed by Ewen Varnell   MD, Tashya Alberty ([id number]) on 12/20/2013 3:40:11 PM      Pt with mechanical fall and pain in the right arm with signs of humeral neck fx.  Pt is N/V intact and after event had sweating and 1 episode of vomiting.  Pt has new changes on EKG but pt and family deny hx of CP or SOB.  Blanchie Dessert, MD 12/20/13 2328

## 2013-12-31 ENCOUNTER — Emergency Department (HOSPITAL_COMMUNITY): Payer: Medicare Other

## 2013-12-31 ENCOUNTER — Encounter (HOSPITAL_COMMUNITY): Payer: Self-pay | Admitting: Emergency Medicine

## 2013-12-31 ENCOUNTER — Inpatient Hospital Stay (HOSPITAL_COMMUNITY)
Admission: EM | Admit: 2013-12-31 | Discharge: 2014-01-05 | DRG: 637 | Disposition: A | Discharge status: Skilled Nursing Facility | Payer: Medicare Other | Attending: Internal Medicine | Admitting: Internal Medicine

## 2013-12-31 DIAGNOSIS — F329 Major depressive disorder, single episode, unspecified: Secondary | ICD-10-CM | POA: Diagnosis present

## 2013-12-31 DIAGNOSIS — E1010 Type 1 diabetes mellitus with ketoacidosis without coma: Principal | ICD-10-CM | POA: Diagnosis present

## 2013-12-31 DIAGNOSIS — I350 Nonrheumatic aortic (valve) stenosis: Secondary | ICD-10-CM

## 2013-12-31 DIAGNOSIS — Z794 Long term (current) use of insulin: Secondary | ICD-10-CM

## 2013-12-31 DIAGNOSIS — Z66 Do not resuscitate: Secondary | ICD-10-CM | POA: Diagnosis not present

## 2013-12-31 DIAGNOSIS — B961 Klebsiella pneumoniae [K. pneumoniae] as the cause of diseases classified elsewhere: Secondary | ICD-10-CM | POA: Diagnosis present

## 2013-12-31 DIAGNOSIS — Z87891 Personal history of nicotine dependence: Secondary | ICD-10-CM

## 2013-12-31 DIAGNOSIS — N179 Acute kidney failure, unspecified: Secondary | ICD-10-CM

## 2013-12-31 DIAGNOSIS — Z8249 Family history of ischemic heart disease and other diseases of the circulatory system: Secondary | ICD-10-CM

## 2013-12-31 DIAGNOSIS — I959 Hypotension, unspecified: Secondary | ICD-10-CM

## 2013-12-31 DIAGNOSIS — D509 Iron deficiency anemia, unspecified: Secondary | ICD-10-CM

## 2013-12-31 DIAGNOSIS — E876 Hypokalemia: Secondary | ICD-10-CM | POA: Diagnosis not present

## 2013-12-31 DIAGNOSIS — E039 Hypothyroidism, unspecified: Secondary | ICD-10-CM

## 2013-12-31 DIAGNOSIS — E871 Hypo-osmolality and hyponatremia: Secondary | ICD-10-CM | POA: Diagnosis present

## 2013-12-31 DIAGNOSIS — M129 Arthropathy, unspecified: Secondary | ICD-10-CM | POA: Diagnosis present

## 2013-12-31 DIAGNOSIS — H548 Legal blindness, as defined in USA: Secondary | ICD-10-CM | POA: Diagnosis present

## 2013-12-31 DIAGNOSIS — Z8544 Personal history of malignant neoplasm of other female genital organs: Secondary | ICD-10-CM

## 2013-12-31 DIAGNOSIS — E43 Unspecified severe protein-calorie malnutrition: Secondary | ICD-10-CM | POA: Diagnosis present

## 2013-12-31 DIAGNOSIS — E41 Nutritional marasmus: Secondary | ICD-10-CM | POA: Diagnosis present

## 2013-12-31 DIAGNOSIS — I10 Essential (primary) hypertension: Secondary | ICD-10-CM

## 2013-12-31 DIAGNOSIS — E1110 Type 2 diabetes mellitus with ketoacidosis without coma: Secondary | ICD-10-CM

## 2013-12-31 DIAGNOSIS — G9341 Metabolic encephalopathy: Secondary | ICD-10-CM | POA: Diagnosis present

## 2013-12-31 DIAGNOSIS — E860 Dehydration: Secondary | ICD-10-CM

## 2013-12-31 DIAGNOSIS — R0682 Tachypnea, not elsewhere classified: Secondary | ICD-10-CM

## 2013-12-31 DIAGNOSIS — F32A Depression, unspecified: Secondary | ICD-10-CM

## 2013-12-31 DIAGNOSIS — F3289 Other specified depressive episodes: Secondary | ICD-10-CM | POA: Diagnosis present

## 2013-12-31 DIAGNOSIS — F0390 Unspecified dementia without behavioral disturbance: Secondary | ICD-10-CM

## 2013-12-31 DIAGNOSIS — E1139 Type 2 diabetes mellitus with other diabetic ophthalmic complication: Secondary | ICD-10-CM

## 2013-12-31 DIAGNOSIS — Z888 Allergy status to other drugs, medicaments and biological substances status: Secondary | ICD-10-CM

## 2013-12-31 DIAGNOSIS — Z8679 Personal history of other diseases of the circulatory system: Secondary | ICD-10-CM

## 2013-12-31 DIAGNOSIS — Z881 Allergy status to other antibiotic agents status: Secondary | ICD-10-CM

## 2013-12-31 DIAGNOSIS — Z87440 Personal history of urinary (tract) infections: Secondary | ICD-10-CM

## 2013-12-31 DIAGNOSIS — N390 Urinary tract infection, site not specified: Secondary | ICD-10-CM

## 2013-12-31 DIAGNOSIS — F419 Anxiety disorder, unspecified: Secondary | ICD-10-CM

## 2013-12-31 DIAGNOSIS — R011 Cardiac murmur, unspecified: Secondary | ICD-10-CM | POA: Diagnosis present

## 2013-12-31 DIAGNOSIS — Z833 Family history of diabetes mellitus: Secondary | ICD-10-CM

## 2013-12-31 DIAGNOSIS — Z79899 Other long term (current) drug therapy: Secondary | ICD-10-CM

## 2013-12-31 DIAGNOSIS — I5032 Chronic diastolic (congestive) heart failure: Secondary | ICD-10-CM

## 2013-12-31 DIAGNOSIS — I509 Heart failure, unspecified: Secondary | ICD-10-CM | POA: Diagnosis present

## 2013-12-31 DIAGNOSIS — I272 Pulmonary hypertension, unspecified: Secondary | ICD-10-CM

## 2013-12-31 DIAGNOSIS — G039 Meningitis, unspecified: Secondary | ICD-10-CM

## 2013-12-31 DIAGNOSIS — E785 Hyperlipidemia, unspecified: Secondary | ICD-10-CM

## 2013-12-31 DIAGNOSIS — K219 Gastro-esophageal reflux disease without esophagitis: Secondary | ICD-10-CM | POA: Diagnosis present

## 2013-12-31 DIAGNOSIS — I359 Nonrheumatic aortic valve disorder, unspecified: Secondary | ICD-10-CM | POA: Diagnosis present

## 2013-12-31 DIAGNOSIS — R627 Adult failure to thrive: Secondary | ICD-10-CM | POA: Diagnosis present

## 2013-12-31 DIAGNOSIS — G9340 Encephalopathy, unspecified: Secondary | ICD-10-CM

## 2013-12-31 DIAGNOSIS — Z8673 Personal history of transient ischemic attack (TIA), and cerebral infarction without residual deficits: Secondary | ICD-10-CM

## 2013-12-31 LAB — BASIC METABOLIC PANEL
BUN: 34 mg/dL — ABNORMAL HIGH (ref 6–23)
BUN: 33 mg/dL — ABNORMAL HIGH (ref 6–23)
BUN: 31 mg/dL — ABNORMAL HIGH (ref 6–23)
CALCIUM: 9.4 mg/dL (ref 8.4–10.5)
CALCIUM: 7.8 mg/dL — AB (ref 8.4–10.5)
CALCIUM: 8 mg/dL — AB (ref 8.4–10.5)
CO2: 7 meq/L — AB (ref 19–32)
CO2: <7 mEq/L — CL (ref 19–32)
CO2: 8 meq/L — AB (ref 19–32)
Chloride: 84 mEq/L — ABNORMAL LOW (ref 96–112)
Chloride: 89 mEq/L — ABNORMAL LOW (ref 96–112)
Chloride: 96 mEq/L (ref 96–112)
Creatinine, Ser: 1.52 mg/dL — ABNORMAL HIGH (ref 0.50–1.10)
Creatinine, Ser: 1.46 mg/dL — ABNORMAL HIGH (ref 0.50–1.10)
Creatinine, Ser: 1.38 mg/dL — ABNORMAL HIGH (ref 0.50–1.10)
GFR calc Af Amer: 39 mL/min — ABNORMAL LOW (ref >90)
GFR calc Af Amer: 41 mL/min — ABNORMAL LOW (ref >90)
GFR calc Af Amer: 43 mL/min — ABNORMAL LOW (ref >90)
GFR calc non Af Amer: 37 mL/min — ABNORMAL LOW (ref >90)
GFR, EST NON AFRICAN AMERICAN: 33 mL/min — AB (ref >90)
GFR, EST NON AFRICAN AMERICAN: 35 mL/min — AB (ref >90)
Glucose, Bld: 626 mg/dL (ref 70–99)
Glucose, Bld: 573 mg/dL (ref 70–99)
Glucose, Bld: 423 mg/dL — ABNORMAL HIGH (ref 70–99)
POTASSIUM: 4.2 meq/L (ref 3.7–5.3)
Potassium: 5.7 mEq/L — ABNORMAL HIGH (ref 3.7–5.3)
Potassium: 5 mEq/L (ref 3.7–5.3)
SODIUM: 128 meq/L — AB (ref 137–147)
SODIUM: 133 meq/L — AB (ref 137–147)
Sodium: 127 mEq/L — ABNORMAL LOW (ref 137–147)

## 2013-12-31 LAB — URINALYSIS, ROUTINE W REFLEX MICROSCOPIC
Glucose, UA: >1000 mg/dL — AB
KETONES UR: 15 mg/dL — AB
Nitrite: NEGATIVE
PROTEIN: 100 mg/dL — AB
Specific Gravity, Urine: 1.02 (ref 1.005–1.030)
Urobilinogen, UA: 0.2 mg/dL (ref 0.0–1.0)
pH: 5 (ref 5.0–8.0)

## 2013-12-31 LAB — I-STAT ARTERIAL BLOOD GAS, ED
Acid-base deficit: 22 mmol/L — ABNORMAL HIGH (ref 0.0–2.0)
Bicarbonate: 5.4 mEq/L — ABNORMAL LOW (ref 20.0–24.0)
O2 Saturation: 96 %
Patient temperature: 98.6
TCO2: 6 mmol/L (ref 0–100)
pCO2 arterial: 16.4 mmHg — CL (ref 35.0–45.0)
pH, Arterial: 7.125 — CL (ref 7.350–7.450)
pO2, Arterial: 105 mmHg — ABNORMAL HIGH (ref 80.0–100.0)

## 2013-12-31 LAB — CBC WITH DIFFERENTIAL/PLATELET
BASOS ABS: 0 10*3/uL (ref 0.0–0.1)
BASOS PCT: 0 % (ref 0–1)
EOS ABS: 0 10*3/uL (ref 0.0–0.7)
Eosinophils Relative: 0 % (ref 0–5)
HEMATOCRIT: 32.8 % — AB (ref 36.0–46.0)
HEMOGLOBIN: 11.1 g/dL — AB (ref 12.0–15.0)
LYMPHS ABS: 1.6 10*3/uL (ref 0.7–4.0)
Lymphocytes Relative: 9 % — ABNORMAL LOW (ref 12–46)
MCH: 30.1 pg (ref 26.0–34.0)
MCHC: 33.8 g/dL (ref 30.0–36.0)
MCV: 88.9 fL (ref 78.0–100.0)
MONO ABS: 1.4 10*3/uL — AB (ref 0.1–1.0)
Monocytes Relative: 8 % (ref 3–12)
NEUTROS ABS: 14.5 10*3/uL — AB (ref 1.7–7.7)
Neutrophils Relative %: 83 % — ABNORMAL HIGH (ref 43–77)
Platelets: 242 10*3/uL (ref 150–400)
RBC: 3.69 MIL/uL — ABNORMAL LOW (ref 3.87–5.11)
RDW: 13.3 % (ref 11.5–15.5)
WBC: 17.5 10*3/uL — ABNORMAL HIGH (ref 4.0–10.5)

## 2013-12-31 LAB — LACTIC ACID, PLASMA
Lactic Acid, Venous: 2.8 mmol/L — ABNORMAL HIGH (ref 0.5–2.2)
Lactic Acid, Venous: 1 mmol/L (ref 0.5–2.2)

## 2013-12-31 LAB — URINE MICROSCOPIC-ADD ON

## 2013-12-31 LAB — CBC
HEMATOCRIT: 31.4 % — AB (ref 36.0–46.0)
HEMOGLOBIN: 10.9 g/dL — AB (ref 12.0–15.0)
MCH: 30.1 pg (ref 26.0–34.0)
MCHC: 34.7 g/dL (ref 30.0–36.0)
MCV: 86.7 fL (ref 78.0–100.0)
Platelets: 272 10*3/uL (ref 150–400)
RBC: 3.62 MIL/uL — AB (ref 3.87–5.11)
RDW: 13.2 % (ref 11.5–15.5)
WBC: 20.9 10*3/uL — AB (ref 4.0–10.5)

## 2013-12-31 LAB — CBG MONITORING, ED
GLUCOSE-CAPILLARY: 503 mg/dL — AB (ref 70–99)
Glucose-Capillary: >600 mg/dL (ref 70–99)
Glucose-Capillary: 553 mg/dL (ref 70–99)
Glucose-Capillary: 571 mg/dL (ref 70–99)

## 2013-12-31 LAB — TROPONIN I: Troponin I: <0.3 ng/mL (ref <0.30)

## 2013-12-31 LAB — HEPATIC FUNCTION PANEL
ALBUMIN: 2.4 g/dL — AB (ref 3.5–5.2)
ALK PHOS: 93 U/L (ref 39–117)
ALT: 28 U/L (ref 0–35)
AST: 19 U/L (ref 0–37)
Bilirubin, Direct: <0.2 mg/dL (ref 0.0–0.3)
TOTAL PROTEIN: 5.3 g/dL — AB (ref 6.0–8.3)
Total Bilirubin: 0.4 mg/dL (ref 0.3–1.2)

## 2013-12-31 LAB — LIPASE, BLOOD: LIPASE: 18 U/L (ref 11–59)

## 2013-12-31 MED ORDER — ONDANSETRON HCL 4 MG/2ML IJ SOLN
4.0000 mg | Freq: Once | INTRAMUSCULAR | Status: DC
  Filled 2013-12-31: qty 2

## 2013-12-31 MED ORDER — SODIUM CHLORIDE 0.9 % IV SOLN
INTRAVENOUS | Status: AC
  Administered 2014-01-01: 4 [IU]/h via INTRAVENOUS
  Administered 2014-01-01: 13.8 [IU]/h via INTRAVENOUS
  Administered 2014-01-02: 6.3 [IU]/h via INTRAVENOUS
  Administered 2014-01-02: 6.2 [IU]/h via INTRAVENOUS
  Administered 2014-01-02: 5.8 [IU]/h via INTRAVENOUS
  Administered 2014-01-02: 3.6 [IU]/h via INTRAVENOUS
  Administered 2014-01-02: 5.3 [IU]/h via INTRAVENOUS
  Administered 2014-01-02: 6.1 [IU]/h via INTRAVENOUS
  Filled 2013-12-31 (×3): qty 1

## 2013-12-31 MED ORDER — SODIUM CHLORIDE 0.9 % IV SOLN
INTRAVENOUS | Status: DC
  Administered 2013-12-31: 23:00:00 via INTRAVENOUS

## 2013-12-31 MED ORDER — LATANOPROST 0.005 % OP SOLN
1.0000 [drp] | Freq: Every day | OPHTHALMIC | Status: DC
  Administered 2013-12-31 – 2014-01-04 (×5): 1 [drp] via OPHTHALMIC
  Filled 2013-12-31: qty 2.5

## 2013-12-31 MED ORDER — DEXTROSE 50 % IV SOLN: 25.0000 mL | INTRAVENOUS | Status: DC | PRN

## 2013-12-31 MED ORDER — SODIUM CHLORIDE 0.9 % IV SOLN
INTRAVENOUS | Status: DC
  Administered 2013-12-31: 4.9 [IU]/h via INTRAVENOUS
  Filled 2013-12-31: qty 1

## 2013-12-31 MED ORDER — SODIUM CHLORIDE 0.9 % IV SOLN
1.0000 g | Freq: Once | INTRAVENOUS | Status: AC
  Administered 2013-12-31: 1 g via INTRAVENOUS
  Filled 2013-12-31: qty 10

## 2013-12-31 MED ORDER — POTASSIUM CHLORIDE 10 MEQ/100ML IV SOLN
10.0000 meq | INTRAVENOUS | Status: AC
  Administered 2013-12-31 – 2014-01-01 (×4): 10 meq via INTRAVENOUS
  Filled 2013-12-31 (×4): qty 100

## 2013-12-31 MED ORDER — SODIUM CHLORIDE 0.9 % IV SOLN: 1000.0000 mL | Freq: Once | INTRAVENOUS | Status: DC

## 2013-12-31 MED ORDER — DEXTROSE-NACL 5-0.45 % IV SOLN
INTRAVENOUS | Status: DC
  Administered 2014-01-01 (×2): via INTRAVENOUS

## 2013-12-31 MED ORDER — SODIUM CHLORIDE 0.9 % IV SOLN: 1000.0000 mL | INTRAVENOUS | Status: DC

## 2013-12-31 MED ORDER — SODIUM CHLORIDE 0.9 % IV SOLN
1000.0000 mL | Freq: Once | INTRAVENOUS | Status: AC
  Administered 2013-12-31: 1000 mL via INTRAVENOUS

## 2013-12-31 MED ORDER — SODIUM CHLORIDE 0.9 % IV BOLUS (SEPSIS)
1000.0000 mL | Freq: Once | INTRAVENOUS | Status: AC
  Administered 2013-12-31: 1000 mL via INTRAVENOUS

## 2013-12-31 MED ORDER — DEXTROSE-NACL 5-0.45 % IV SOLN: INTRAVENOUS | Status: DC

## 2013-12-31 MED ORDER — DEXTROSE 5 % IV SOLN
1.0000 g | INTRAVENOUS | Status: DC
  Administered 2013-12-31 – 2014-01-04 (×5): 1 g via INTRAVENOUS
  Filled 2013-12-31 (×8): qty 10

## 2013-12-31 MED ORDER — LEVOTHYROXINE SODIUM 100 MCG IV SOLR
37.5000 ug | Freq: Every day | INTRAVENOUS | Status: DC
  Administered 2014-01-01 – 2014-01-03 (×3): 37.5 ug via INTRAVENOUS
  Filled 2013-12-31 (×4): qty 5

## 2013-12-31 MED ORDER — MORPHINE SULFATE 2 MG/ML IJ SOLN
2.0000 mg | INTRAMUSCULAR | Status: DC | PRN
  Filled 2013-12-31: qty 1

## 2013-12-31 MED ORDER — SODIUM CHLORIDE 0.9 % IV SOLN
1000.0000 mL | Freq: Once | INTRAVENOUS | Status: AC
Start: 2013-12-31 — End: 2013-12-31
  Administered 2013-12-31: 1000 mL via INTRAVENOUS

## 2013-12-31 NOTE — ED Notes (Signed)
EMS report:  Altered mental status since Friday, from nursing home, [facility].  initially arousible to pain but now  Broke humerus 2 weeks ago  CBG 600 BP: 88/40 97.7  99% RA, breath sounds clear Unsure of baseline mental status

## 2013-12-31 NOTE — ED Notes (Addendum)
IO placed by dr Eulis Foster, left humerus

## 2013-12-31 NOTE — ED Notes (Signed)
Patient transported to ct, this rn accompanied

## 2013-12-31 NOTE — Progress Notes (Signed)
ANTIBIOTIC CONSULT NOTE - INITIAL  Pharmacy Consult for Rocephin Indication: UTI  Allergies  Allergen Reactions  . Amoxicillin     REACTION: rash  . Ciprofloxacin     REACTION: hives  . Codeine     REACTION: nervous, shaky  . Duloxetine     REACTION: reaction not known  . Hydrochlorothiazide W-Triamterene     REACTION: hyponatremia  . Ticlopidine Hcl     REACTION: bleeding    Patient Measurements: Height: 5\' 1"  (154.9 cm) Weight: 183 lb (83.008 kg) IBW/kg (Calculated) : 47.8  Vital Signs: Temp: 99.6 F (37.6 C) (03/22 1607) Temp src: Rectal (03/22 1607) BP: 93/51 mmHg (03/22 2045) Pulse Rate: 73 (03/22 2045) Intake/Output from previous day:   Intake/Output from this shift:    Labs:  Recent Labs  12/31/13 1722 12/31/13 1930  WBC  --  17.5*  HGB  --  11.1*  PLT  --  242  CREATININE 1.52* 1.46*   Estimated Creatinine Clearance: 34.5 ml/min (by C-G formula based on Cr of 1.46).    Microbiology: No results found for this or any previous visit (from the past 720 hour(s)).  Medical History: Past Medical History  Diagnosis Date  . Hypertension   . Hyperlipidemia   . TIA (transient ischemic attack) 1990's    "mini strokes" (06/07/2013)  . Legally blind     "both eyes; some vision in the right' (06/07/2013)  . Heart murmur 1960's  . Hypothyroidism   . Type II diabetes mellitus   . Anemia   . GERD (gastroesophageal reflux disease)   . Arthritis     "left leg" (06/07/2013)  . Depression   . Vulva cancer   . Altered mental status     "the first time I noticed any problem was 2 d ago" (06/07/2013)  . Aortic stenosis     mod-severe    Medications:   (Not in a hospital admission) Assessment: 73 yo F admitted with DKA and started in insulin infusion/glucomander protocol.  Pt noted to have UA concerning for UTI, Cx sent.  To start Rocephin.  Noted Amox allergy = rash but pt has tolerated Rocephin in the past.  Goal of Therapy:  Eradication of  Infection  Plan:  Rocephin 1gm IV q24h No further dose adjustment anticipated. Rx will sign off.  [employer], Pharm.D., BCPS Clinical Pharmacist Pager [phone] 12/31/2013 9:04 PM

## 2013-12-31 NOTE — ED Notes (Signed)
Dr. Wentz at bedside. 

## 2013-12-31 NOTE — ED Notes (Signed)
CBG was 503

## 2013-12-31 NOTE — ED Provider Notes (Signed)
CSN: [id number]     Arrival date & time 12/31/13  1555 History   First MD Initiated Contact with Patient 12/31/13 [phone]     Chief Complaint  Patient presents with  . Altered Mental Status     (Consider location/radiation/quality/duration/timing/severity/associated sxs/prior Treatment) Patient is a 73 y.o. female presenting with altered mental status.  Altered Mental Status  She is brought in by EMS for evaluation of hyperglycemia. Her family members saw her yesterday and she was somewhat confused, which is unusual for her. She fractured her right humerus in a fall 2 weeks ago. She lives in a nursing care facility.  Level 5 caveat- altered mental status  Past Medical History  Diagnosis Date  . Hypertension   . Hyperlipidemia   . TIA (transient ischemic attack) 1990's    "mini strokes" (06/07/2013)  . Legally blind     "both eyes; some vision in the right' (06/07/2013)  . Heart murmur 1960's  . Hypothyroidism   . Type II diabetes mellitus   . Anemia   . GERD (gastroesophageal reflux disease)   . Arthritis     "left leg" (06/07/2013)  . Depression   . Vulva cancer   . Altered mental status     "the first time I noticed any problem was 2 d ago" (06/07/2013)  . Aortic stenosis     mod-severe   Past Surgical History  Procedure Laterality Date  . Femur fracture surgery Left 1999  . Cesarean section  1968  . Tubal ligation  1970's  . Vulva surgery  1990's    "cut a big chunk out for cancer" (06/07/2013)  . Transthoracic echocardiogram  02/2013    EF 55-60%, mod conc hypertrophy, grade 1 diastolic dysfunction; AV with mod stenosis; calcified MV annulus & mild MR; LA severely dilated; PA peak pressure 72mmHg   Family History  Problem Relation Age of Onset  . Hypertension Mother   . Hyperlipidemia Mother   . Hyperlipidemia Father   . Hypertension Father   . Diabetes Father    History  Substance Use Topics  . Smoking status: Former Smoker -- 4.00 packs/day for 30 years     Types: Cigarettes    Quit date: 09/14/1987  . Smokeless tobacco: Never Used     Comment: 06/07/2013 "hasn't smoked since her stroke in the 1990's"  . Alcohol Use: No   OB History   Grav Para Term Preterm Abortions TAB SAB Ect Mult Living                 Review of Systems  Unable to perform ROS     Allergies  Amoxicillin; Ciprofloxacin; Codeine; Duloxetine; Hydrochlorothiazide w-triamterene; and Ticlopidine hcl  Home Medications   Current Outpatient Rx  Name  Route  Sig  Dispense  Refill  . amLODipine (NORVASC) 5 MG tablet   Oral   Take 1 tablet (5 mg total) by mouth daily.         Marland Kitchen atorvastatin (LIPITOR) 20 MG tablet   Oral   Take 20 mg by mouth at bedtime.         Marland Kitchen buPROPion (WELLBUTRIN SR) 150 MG 12 hr tablet   Oral   Take 150 mg by mouth 2 (two) times daily.         . calcium-vitamin D (OSCAL WITH D) 500-200 MG-UNIT per tablet   Oral   Take 1 tablet by mouth 2 (two) times daily.         . carvedilol (COREG) 25  MG tablet   Oral   Take 1 tablet (25 mg total) by mouth 2 (two) times daily with a meal.   60 tablet   0   . dipyridamole-aspirin (AGGRENOX) 200-25 MG per 12 hr capsule   Oral   Take 1 capsule by mouth 2 (two) times daily.         . DULoxetine (CYMBALTA) 60 MG capsule   Oral   Take 60 mg by mouth daily.         Marland Kitchen ezetimibe (ZETIA) 10 MG tablet   Oral   Take 10 mg by mouth at bedtime.          . feeding supplement, ENSURE, (ENSURE) PUDG   Oral   Take 1 Container by mouth 2 (two) times daily as needed (Please offer if meal completion is less than 75%).      0   . ferrous fumarate (HEMOCYTE - 106 MG FE) 325 (106 FE) MG TABS tablet   Oral   Take 2 tablets by mouth daily.          Marland Kitchen HYDROcodone-acetaminophen (NORCO/VICODIN) 5-325 MG per tablet   Oral   Take 1 tablet by mouth every 4 (four) hours as needed.   17 tablet   0   . insulin aspart (NOVOLOG) 100 UNIT/ML injection   Subcutaneous   Inject 0-5 Units into the skin 3  (three) times daily before meals. Per sliding scale : (0-200 bs = 0 units) ([id number] bs = 5 units)         . insulin detemir (LEVEMIR) 100 UNIT/ML injection   Subcutaneous   Inject 35 Units into the skin 2 (two) times daily.         Marland Kitchen levothyroxine (SYNTHROID, LEVOTHROID) 75 MCG tablet   Oral   Take 1 tablet (75 mcg total) by mouth daily before breakfast.   30 tablet   0   . Memantine HCl ER (NAMENDA XR) 7 MG CP24   Oral   Take 7 mg by mouth at bedtime.          . Multiple Vitamin (MULTIVITAMIN WITH MINERALS) TABS tablet   Oral   Take 1 tablet by mouth daily.         . Omega-3 Fatty Acids (FISH OIL) 1000 MG CAPS   Oral   Take 2,000 mg by mouth daily.         Marland Kitchen saccharomyces boulardii (FLORASTOR) 250 MG capsule   Oral   Take 500 mg by mouth daily.         . Travoprost, BAK Free, (TRAVATAN) 0.004 % SOLN ophthalmic solution   Both Eyes   Place 1 drop into both eyes at bedtime.          BP 123/62  Pulse 73  Temp(Src) 99.6 F (37.6 C) (Rectal)  Resp 17  Ht 5\' 1"  (1.549 m)  Wt 183 lb (83.008 kg)  BMI 34.60 kg/m2  SpO2 100% Physical Exam  Nursing note and vitals reviewed. Constitutional: She appears well-developed.  Elderly, frail  HENT:  Head: Normocephalic and atraumatic.  Eyes: Conjunctivae and EOM are normal. Pupils are equal, round, and reactive to light.  Neck: Normal range of motion and phonation normal. Neck supple.  Cardiovascular: Regular rhythm and intact distal pulses.   bradycardia  Pulmonary/Chest: Effort normal. She has no wheezes. She exhibits no tenderness.  Tachypnea with Kusmal respiration  Abdominal: Soft. She exhibits no distension. There is no tenderness. There is no guarding.  Musculoskeletal: Normal  range of motion.  Neurological: She is alert. She exhibits normal muscle tone.  Skin: Skin is warm and dry.  Psychiatric:  lethargic    ED Course  Procedures (including critical care time) IV access- peripheral IV access by  nursing, failed. I looked for an appropriate upper extremity, to cannulate. None found. EJ were not amenable to cannulation.  Interosseous access obtained- left humerus without complication- [id number]  IO infiltrated, Nursing obtained peripheral IV.  Medications  morphine 2 MG/ML injection 2 mg (2 mg Intravenous Not Given 12/31/13 1630)  ondansetron (ZOFRAN) injection 4 mg (not administered)  0.9 %  sodium chloride infusion (0 mLs Intravenous Stopped 12/31/13 2049)    Followed by  0.9 %  sodium chloride infusion (1,000 mLs Intravenous New Bag/Given 12/31/13 2053)    Followed by  0.9 %  sodium chloride infusion (not administered)  insulin regular (NOVOLIN R,HUMULIN R) 1 Units/mL in sodium chloride 0.9 % 100 mL infusion (8.9 Units/hr Intravenous Rate/Dose Change 12/31/13 2050)  0.9 %  sodium chloride infusion (0 mLs Intravenous Stopped 12/31/13 1830)    Followed by  0.9 %  sodium chloride infusion (not administered)    Followed by  0.9 %  sodium chloride infusion (not administered)  dextrose 5 %-0.45 % sodium chloride infusion (not administered)  cefTRIAXone (ROCEPHIN) 1 g in dextrose 5 % 50 mL IVPB (not administered)  sodium chloride 0.9 % bolus 1,000 mL (0 mLs Intravenous Stopped 12/31/13 1831)  calcium gluconate 1 g in sodium chloride 0.9 % 100 mL IVPB (0 g Intravenous Stopped 12/31/13 2002)    Patient Vitals for the past 24 hrs:  BP Temp Temp src Pulse Resp SpO2 Height Weight  12/31/13 2105 123/62 mmHg - - - 17 100 % - -  12/31/13 2045 93/51 mmHg - - 73 19 100 % - -  12/31/13 2030 110/41 mmHg - - 85 16 98 % - -  12/31/13 2008 - - - 71 17 100 % - -  12/31/13 2006 - - - 75 16 100 % - -  12/31/13 2004 - - - 74 21 100 % - -  12/31/13 2002 - - - 73 15 100 % - -  12/31/13 2000 105/53 mmHg - - 66 14 100 % - -  12/31/13 1950 113/42 mmHg - - 85 20 100 % - -  12/31/13 1930 98/61 mmHg - - 89 17 100 % - -  12/31/13 1927 - - - - - - 5\' 1"  (1.549 m) 183 lb (83.008 kg)  12/31/13 1900 83/58 mmHg - - 86  17 100 % - -  12/31/13 1840 65/55 mmHg - - 87 18 100 % - -  12/31/13 1830 83/38 mmHg - - 92 18 99 % - -  12/31/13 1820 107/74 mmHg - - 89 16 100 % - -  12/31/13 1815 - - - 56 17 94 % - -  12/31/13 1810 100/40 mmHg - - 56 16 100 % - -  12/31/13 1758 115/41 mmHg - - 96 18 100 % - -  12/31/13 1746 88/45 mmHg - - 49 17 99 % - -  12/31/13 1744 85/46 mmHg - - 52 17 100 % - -  12/31/13 1736 113/44 mmHg - - 112 16 100 % - -  12/31/13 1728 82/44 mmHg - - 50 21 100 % - -  12/31/13 1726 103/41 mmHg - - 53 23 100 % - -  12/31/13 1720 98/42 mmHg - - 55 19 99 % - -  12/31/13 1706 82/40 mmHg - - 50 20 100 % - -  12/31/13 1701 - - - - - - 5\' 1"  (1.549 m) 183 lb (83.008 kg)  12/31/13 1700 86/40 mmHg - - 51 22 100 % - -  12/31/13 1658 - - - 50 22 100 % - -  12/31/13 1656 - - - 51 20 100 % - -  12/31/13 1655 84/41 mmHg - - 51 17 100 % - -  12/31/13 1654 - - - 57 15 100 % - -  12/31/13 1653 88/42 mmHg - - 51 20 100 % - -  12/31/13 1652 - - - 53 20 100 % - -  12/31/13 1650 - - - 52 18 100 % - -  12/31/13 1648 - - - 50 18 97 % - -  12/31/13 1646 - - - 84 18 100 % - -  12/31/13 1644 - - - 102 17 98 % - -  12/31/13 1642 - - - 109 16 100 % - -  12/31/13 1640 - - - 56 15 100 % - -  12/31/13 1638 - - - 53 18 99 % - -  12/31/13 1636 - - - 51 17 100 % - -  12/31/13 1634 - - - 51 15 98 % - -  12/31/13 1632 - - - 56 13 99 % - -  12/31/13 1615 87/38 mmHg - - 49 16 99 % - -  12/31/13 1607 84/46 mmHg 99.6 F (37.6 C) Rectal 50 14 99 % - -  12/31/13 1606 84/46 mmHg - - 53 17 99 % - -    6:11 PM Reevaluation with update and discussion. After initial assessment and treatment, an updated evaluation reveals her blood pressure improved, CT negative for acute abnormality, she remains lethargic. Donavon Kimrey L   Recurrent hypotension required central line access  7:20 PM Reevaluation with update and discussion. After initial assessment and treatment, an updated evaluation reveals patient is more alert, following  commands, and comfortable. Last blood pressure 92/42. Central line access has been completed without apparent complication. Jazlene Bares L   7:24 PM-Consult complete with Dr. Hal Hope. Patient case explained and discussed. He agrees to admit patient for further evaluation and treatment. Call ended at 1933  21:15- central line catheter terminates in the right atrium. Catheter readjustment- by me, using sterile technique, the central catheter was pulled back, 3 cm and resutured, then covered with a sterile, occlusive dressing.  CENTRAL LINE Performed by: Richarda Blade Consent: The procedure was performed in an emergent situation. Required items: required blood products, implants, devices, and special equipment available Patient identity confirmed: arm band and provided demographic data Time out: Immediately prior to procedure a "time out" was called to verify the correct patient, procedure, equipment, support staff and site/side marked as required. Indications: vascular access Anesthesia: local infiltration Local anesthetic: lidocaine 1% with epinephrine Anesthetic total: 3 ml Patient sedated: no Preparation: skin prepped with 2% chlorhexidine Skin prep agent dried: skin prep agent completely dried prior to procedure Sterile barriers: all five maximum sterile barriers used - cap, mask, sterile gown, sterile gloves, and large sterile sheet Hand hygiene: hand hygiene performed prior to central venous catheter insertion  Location details: , right subclavian   Catheter type: triple lumen Catheter size: 8 Fr Pre-procedure: landmarks identified Ultrasound guidance:  no  Successful placement: yes Post-procedure: line sutured and dressing applied Assessment: blood return through all parts, free fluid flow, placement verified by x-ray and no pneumothorax on x-ray  Patient tolerance: Patient tolerated the procedure well with no immediate complications.  CRITICAL CARE Performed by:  Richarda Blade Total critical care time: 95 Critical care time was exclusive of separately billable procedures and treating other patients. Critical care was necessary to treat or prevent imminent or life-threatening deterioration. Critical care was time spent personally by me on the following activities: development of treatment plan with patient and/or surrogate as well as nursing, discussions with consultants, evaluation of patient's response to treatment, examination of patient, obtaining history from patient or surrogate, ordering and performing treatments and interventions, ordering and review of laboratory studies, ordering and review of radiographic studies, pulse oximetry and re-evaluation of patient's condition.  Labs Review Labs Reviewed  LACTIC ACID, PLASMA - Abnormal; Notable for the following:    Lactic Acid, Venous 2.8 (*)    All other components within normal limits  BASIC METABOLIC PANEL - Abnormal; Notable for the following:    Sodium 127 (*)    Potassium 5.7 (*)    Chloride 84 (*)    CO2 7 (*)    Glucose, Bld 626 (*)    BUN 34 (*)    Creatinine, Ser 1.52 (*)    GFR calc non Af Amer 33 (*)    GFR calc Af Amer 39 (*)    All other components within normal limits  URINALYSIS, ROUTINE W REFLEX MICROSCOPIC - Abnormal; Notable for the following:    APPearance TURBID (*)    Glucose, UA >1000 (*)    Hgb urine dipstick LARGE (*)    Bilirubin Urine MODERATE (*)    Ketones, ur 15 (*)    Protein, ur 100 (*)    Leukocytes, UA LARGE (*)    All other components within normal limits  BASIC METABOLIC PANEL - Abnormal; Notable for the following:    Sodium 128 (*)    Chloride 89 (*)    CO2 <7 (*)    Glucose, Bld 573 (*)    BUN 33 (*)    Creatinine, Ser 1.46 (*)    Calcium 7.8 (*)    GFR calc non Af Amer 35 (*)    GFR calc Af Amer 41 (*)    All other components within normal limits  URINE MICROSCOPIC-ADD ON - Abnormal; Notable for the following:    Squamous Epithelial / LPF  MANY (*)    Bacteria, UA MANY (*)    All other components within normal limits  CBC WITH DIFFERENTIAL - Abnormal; Notable for the following:    WBC 17.5 (*)    RBC 3.69 (*)    Hemoglobin 11.1 (*)    HCT 32.8 (*)    All other components within normal limits  CBG MONITORING, ED - Abnormal; Notable for the following:    Glucose-Capillary >600 (*)    All other components within normal limits  CBG MONITORING, ED - Abnormal; Notable for the following:    Glucose-Capillary 571 (*)    All other components within normal limits  CBG MONITORING, ED - Abnormal; Notable for the following:    Glucose-Capillary 553 (*)    All other components within normal limits  CBG MONITORING, ED - Abnormal; Notable for the following:    Glucose-Capillary 503 (*)    All other components within normal limits  I-STAT ARTERIAL BLOOD GAS, ED - Abnormal; Notable for the following:    pH, Arterial 7.125 (*)    pCO2 arterial 16.4 (*)    pO2, Arterial 105.0 (*)    Bicarbonate 5.4 (*)  Acid-base deficit 22.0 (*)    All other components within normal limits  URINE CULTURE  LIPASE, BLOOD  TSH  HEMOGLOBIN A1C  TROPONIN I  HEPATIC FUNCTION PANEL  LACTIC ACID, PLASMA  CORTISOL  BLOOD GAS, ARTERIAL   Imaging Review Ct Head Wo Contrast  12/31/2013   CLINICAL DATA:  Altered mental status  EXAM: CT HEAD WITHOUT CONTRAST  TECHNIQUE: Contiguous axial images were obtained from the base of the skull through the vertex without intravenous contrast.  COMPARISON:  CT HEAD W/O CM dated 11/26/2013  FINDINGS: There is no evidence of mass effect, midline shift, or extra-axial fluid collections. There is no evidence of a space-occupying lesion or intracranial hemorrhage. There is no evidence of a cortical-based area of acute infarction. There is generalized cerebral atrophy. There is periventricular white matter low attenuation likely secondary to microangiopathy.  The ventricles and sulci are appropriate for the patient's age. The  basal cisterns are patent.  Visualized portions of the orbits are unremarkable. The visualized portions of the paranasal sinuses and mastoid air cells are unremarkable. Cerebrovascular atherosclerotic calcifications are noted.  The osseous structures are unremarkable.  IMPRESSION: No acute intracranial pathology.   Electronically Signed   By: Kathreen Devoid   On: 12/31/2013 18:03   Dg Chest Port 1 View  12/31/2013   CLINICAL DATA:  Central line placement.  Diabetic ketoacidosis.  EXAM: PORTABLE CHEST - 1 VIEW  COMPARISON:  12/20/2013  FINDINGS: A new right subclavian central venous catheter is seen with tip in the mid right atrium. No evidence of pneumothorax.  Cardiomegaly is stable.  Both lungs are clear.  IMPRESSION: New right subclavian central venous catheter tip in the mid right atrium. No evidence of pneumothorax.  Stable cardiomegaly.  No active lung disease.   Electronically Signed   By: Earle Gell M.D.   On: 12/31/2013 20:11       Date: 12/31/13  Rate: 53  Rhythm: junctional rhythm  QRS Axis: left  PR and QT Intervals: normal  ST/T Wave abnormalities: normal  PR and QRS Conduction Disutrbances:none  Narrative Interpretation:   Old EKG Reviewed: Compared to 12/20/13, which was sinus rhythm     MDM   Final diagnoses:  DKA (diabetic ketoacidoses)  Acute kidney injury  Hypotension  Tachypnea    DKA with anion gap 36. Cause of DKA, not clear. Hypotension related to dehydration and volume depletion, without evidence for frank sepsis. She's improved with initial treatment will require aggressive observation and management likely in a step down unit or ICU.  Nursing Notes Reviewed/ Care Coordinated, and agree without changes. Applicable Imaging Reviewed.  Interpretation of Laboratory Data incorporated into ED treatment  Plan: Admit    Richarda Blade, MD 12/31/13 2121

## 2013-12-31 NOTE — ED Notes (Signed)
Report attempted x 1

## 2013-12-31 NOTE — ED Notes (Signed)
Placement of central line verified by chest xray with Dr. Eulis Foster.

## 2013-12-31 NOTE — H&P (Addendum)
[facility] History and Physical  [name] BJY:[id number] DOB: 1941-02-18 DOA: 12/31/2013  Referring physician: ER physician. PCP: Delia Chimes, NP   Chief Complaint: Altered mental status.  History obtained from patient's family.  HPI: Regina [name] is a 73 y.o. female history of diabetes mellitus, hypothyroidism, hypertension, hyperlipidemia, legally blind, previous history of stroke, history of vulvar cancer was brought to the ER from patients nursing home after patient was found to be increasingly lethargic since morning. As per patient's family patient has had a right humerus fracture last week and is to follow with Dr. Percell Miller and Para March as outpatient conservative management. Patient was placed on pain relief medications and sling for the right humerus fracture. Of the last 2-3 days patient has been having increasing thirst. But patient did not have any chest pain shortness of breath nausea vomiting abdominal pain fever chills diarrhea headache or any focal deficits per the family. Since patient became lethargic since morning patient was brought to the ER and labs revealed patient is in severe DKA. However patient was found to be hypotensive and at this time has received 4 L of normal saline. UA also shows features concerning for UTI. On my exam patient is presently answering questions and following commands.  Review of Systems: As presented in the history of presenting illness, rest negative.  Past Medical History  Diagnosis Date  . Hypertension   . Hyperlipidemia   . TIA (transient ischemic attack) 1990's    "mini strokes" (06/07/2013)  . Legally blind     "both eyes; some vision in the right' (06/07/2013)  . Heart murmur 1960's  . Hypothyroidism   . Type II diabetes mellitus   . Anemia   . GERD (gastroesophageal reflux disease)   . Arthritis     "left leg" (06/07/2013)  . Depression   . Vulva cancer   . Altered mental status     "the first time I noticed any problem  was 2 d ago" (06/07/2013)  . Aortic stenosis     mod-severe   Past Surgical History  Procedure Laterality Date  . Femur fracture surgery Left 1999  . Cesarean section  1968  . Tubal ligation  1970's  . Vulva surgery  1990's    "cut a big chunk out for cancer" (06/07/2013)  . Transthoracic echocardiogram  02/2013    EF 55-60%, mod conc hypertrophy, grade 1 diastolic dysfunction; AV with mod stenosis; calcified MV annulus & mild MR; LA severely dilated; PA peak pressure 72mmHg   Social History:  reports that she quit smoking about 26 years ago. Her smoking use included Cigarettes. She has a 120 pack-year smoking history. She has never used smokeless tobacco. She reports that she does not drink alcohol or use illicit drugs. Where does patient live nursing home. Can patient participate in ADLs? Not sure.  Allergies  Allergen Reactions  . Amoxicillin     REACTION: rash  . Ciprofloxacin     REACTION: hives  . Codeine     REACTION: nervous, shaky  . Duloxetine     REACTION: reaction not known  . Hydrochlorothiazide W-Triamterene     REACTION: hyponatremia  . Ticlopidine Hcl     REACTION: bleeding    Family History:  Family History  Problem Relation Age of Onset  . Hypertension Mother   . Hyperlipidemia Mother   . Hyperlipidemia Father   . Hypertension Father   . Diabetes Father       Prior to Admission medications  Medication Sig Start Date End Date Taking? Authorizing Provider  amLODipine (NORVASC) 5 MG tablet Take 1 tablet (5 mg total) by mouth daily. 11/29/13  Yes Geradine Girt, DO  atorvastatin (LIPITOR) 20 MG tablet Take 20 mg by mouth at bedtime.   Yes Historical Provider, MD  buPROPion (WELLBUTRIN SR) 150 MG 12 hr tablet Take 150 mg by mouth 2 (two) times daily.   Yes Historical Provider, MD  calcium-vitamin D (OSCAL WITH D) 500-200 MG-UNIT per tablet Take 1 tablet by mouth 2 (two) times daily.   Yes Historical Provider, MD  carvedilol (COREG) 25 MG tablet Take 1  tablet (25 mg total) by mouth 2 (two) times daily with a meal. 10/12/13  Yes Allie Bossier, MD  dipyridamole-aspirin (AGGRENOX) 200-25 MG per 12 hr capsule Take 1 capsule by mouth 2 (two) times daily. 06/09/13  Yes Shanker Kristeen Mans, MD  DULoxetine (CYMBALTA) 60 MG capsule Take 60 mg by mouth daily.   Yes Historical Provider, MD  ezetimibe (ZETIA) 10 MG tablet Take 10 mg by mouth at bedtime.    Yes Historical Provider, MD  feeding supplement, ENSURE, (ENSURE) PUDG Take 1 Container by mouth 2 (two) times daily as needed (Please offer if meal completion is less than 75%). 11/29/13  Yes Geradine Girt, DO  ferrous fumarate (HEMOCYTE - 106 MG FE) 325 (106 FE) MG TABS tablet Take 2 tablets by mouth daily.    Yes Historical Provider, MD  HYDROcodone-acetaminophen (NORCO/VICODIN) 5-325 MG per tablet Take 1 tablet by mouth every 4 (four) hours as needed. 12/20/13  Yes Antonietta Breach, PA-C  insulin aspart (NOVOLOG) 100 UNIT/ML injection Inject 0-5 Units into the skin 3 (three) times daily before meals. Per sliding scale : (0-200 bs = 0 units) [phone] bs = 5 units)   Yes Historical Provider, MD  insulin detemir (LEVEMIR) 100 UNIT/ML injection Inject 35 Units into the skin 2 (two) times daily. 07/31/13  Yes Philemon Kingdom, MD  levothyroxine (SYNTHROID, LEVOTHROID) 75 MCG tablet Take 1 tablet (75 mcg total) by mouth daily before breakfast. 06/07/13  Yes Nishant Dhungel, MD  Memantine HCl ER (NAMENDA XR) 7 MG CP24 Take 7 mg by mouth at bedtime.    Yes Historical Provider, MD  Multiple Vitamin (MULTIVITAMIN WITH MINERALS) TABS tablet Take 1 tablet by mouth daily.   Yes Historical Provider, MD  Omega-3 Fatty Acids (FISH OIL) 1000 MG CAPS Take 2,000 mg by mouth daily.   Yes Historical Provider, MD  saccharomyces boulardii (FLORASTOR) 250 MG capsule Take 500 mg by mouth daily.   Yes Historical Provider, MD  Travoprost, BAK Free, (TRAVATAN) 0.004 % SOLN ophthalmic solution Place 1 drop into both eyes at bedtime.   Yes  Historical Provider, MD    Physical Exam: Filed Vitals:   12/31/13 2002 12/31/13 2004 12/31/13 2006 12/31/13 2008  BP:      Pulse: 73 74 75 71  Temp:      TempSrc:      Resp: 15 21 16 17   Height:      Weight:      SpO2: 100% 100% 100% 100%     General:  Well-developed and moderately nourished.  Eyes: Anicteric no pallor.  ENT: No discharge from ears eyes nose mouth.  Neck: No mass felt.  Cardiovascular: S1-S2 heard.  Respiratory: No rhonchi or crepitations.  Abdomen: Soft nontender bowel sounds present.  Skin: Has some skin mottling-type in the groin area.  Musculoskeletal: No edema. Right humerus fracture with ecchymosis in  the right arm.  Psychiatric: Patient is lethargic.  Neurologic: Patient is lethargic but follows commands.  Labs on Admission:  Basic Metabolic Panel:  Recent Labs Lab 12/31/13 1722 12/31/13 1930  NA 127* 128*  K 5.7* 5.0  CL 84* 89*  CO2 7* <7*  GLUCOSE 626* 573*  BUN 34* 33*  CREATININE 1.52* 1.46*  CALCIUM 9.4 7.8*   Liver Function Tests: No results found for this basename: AST, ALT, ALKPHOS, BILITOT, PROT, ALBUMIN,  in the last 168 hours  Recent Labs Lab 12/31/13 1722  LIPASE 18   No results found for this basename: AMMONIA,  in the last 168 hours CBC: No results found for this basename: WBC, NEUTROABS, HGB, HCT, MCV, PLT,  in the last 168 hours Cardiac Enzymes: No results found for this basename: CKTOTAL, CKMB, CKMBINDEX, TROPONINI,  in the last 168 hours  BNP (last 3 results) No results found for this basename: PROBNP,  in the last 8760 hours CBG:  Recent Labs Lab 12/31/13 1622 12/31/13 1825 12/31/13 1826  GLUCAP >600* 571* 553*    Radiological Exams on Admission: Ct Head Wo Contrast  12/31/2013   CLINICAL DATA:  Altered mental status  EXAM: CT HEAD WITHOUT CONTRAST  TECHNIQUE: Contiguous axial images were obtained from the base of the skull through the vertex without intravenous contrast.  COMPARISON:  CT  HEAD W/O CM dated 11/26/2013  FINDINGS: There is no evidence of mass effect, midline shift, or extra-axial fluid collections. There is no evidence of a space-occupying lesion or intracranial hemorrhage. There is no evidence of a cortical-based area of acute infarction. There is generalized cerebral atrophy. There is periventricular white matter low attenuation likely secondary to microangiopathy.  The ventricles and sulci are appropriate for the patient's age. The basal cisterns are patent.  Visualized portions of the orbits are unremarkable. The visualized portions of the paranasal sinuses and mastoid air cells are unremarkable. Cerebrovascular atherosclerotic calcifications are noted.  The osseous structures are unremarkable.  IMPRESSION: No acute intracranial pathology.   Electronically Signed   By: Kathreen Devoid   On: 12/31/2013 18:03   Dg Chest Port 1 View  12/31/2013   CLINICAL DATA:  Central line placement.  Diabetic ketoacidosis.  EXAM: PORTABLE CHEST - 1 VIEW  COMPARISON:  12/20/2013  FINDINGS: A new right subclavian central venous catheter is seen with tip in the mid right atrium. No evidence of pneumothorax.  Cardiomegaly is stable.  Both lungs are clear.  IMPRESSION: New right subclavian central venous catheter tip in the mid right atrium. No evidence of pneumothorax.  Stable cardiomegaly.  No active lung disease.   Electronically Signed   By: Earle Gell M.D.   On: 12/31/2013 20:11     Assessment/Plan Principal Problem:   DKA (diabetic ketoacidoses) Active Problems:   HYPOTHYROIDISM   UTI (urinary tract infection)   Acute renal failure   1. Severe diabetic ketoacidosis - probably precipitated by UTI. As per the family patient did not have any nausea vomiting chest pain diarrhea fever chills. Patient has received 4 L normal saline bolus following which blood pressure has improved at this time. We will continue with aggressive hydration. Continue with IV insulin infusion until anion gap is  corrected. Closely observe in step down. 2. UTI with possible sepsis - patient has been placed on ceftriaxone. Follow cultures. 3. Acute encephalopathy probably from #1 and 2 - closely observe. 4. Mild renal failure and electrolyte changes - probably from dehydration and DKA. Closely observe intake output  and metabolic panel. I think patient's electrolyte changes will change with correction of DKA. 5. Hypothyroidism - since patient is n.p.o. now I had Patient on IV Synthroid. 6. Hypertension - patient was hypotensive on arrival so antihypertensives on hold. 7. Hyperlipidemia - continue statins. 8. Recent right humerus fracture - orthopedics as outpatient.  Patient has some skin mottling in the groin area. Closely observe for any further worsening. Patient CBC and EKG are pending.  Patient's family has stated that it was patient's wishes not to be resuscitated including no Vasopressors, CPR and intubation.  Code Status: DO NOT RESUSCITATE.  Family Communication: Patient's family the bedside.  Disposition Plan: Admit to inpatient.    Tremel Setters N. [facility] Pager [phone].  If 7PM-7AM, please contact night-coverage [email or web address] Password [facility] 12/31/2013, 8:40 PM

## 2014-01-01 DIAGNOSIS — F0390 Unspecified dementia without behavioral disturbance: Secondary | ICD-10-CM

## 2014-01-01 LAB — CBC WITH DIFFERENTIAL/PLATELET
BASOS ABS: 0 10*3/uL (ref 0.0–0.1)
Basophils Relative: 0 % (ref 0–1)
EOS PCT: 0 % (ref 0–5)
Eosinophils Absolute: 0 10*3/uL (ref 0.0–0.7)
HEMATOCRIT: 30.3 % — AB (ref 36.0–46.0)
Hemoglobin: 10.5 g/dL — ABNORMAL LOW (ref 12.0–15.0)
LYMPHS ABS: 2.5 10*3/uL (ref 0.7–4.0)
Lymphocytes Relative: 15 % (ref 12–46)
MCH: 29.7 pg (ref 26.0–34.0)
MCHC: 34.7 g/dL (ref 30.0–36.0)
MCV: 85.8 fL (ref 78.0–100.0)
Monocytes Absolute: 1 10*3/uL (ref 0.1–1.0)
Monocytes Relative: 6 % (ref 3–12)
NEUTROS PCT: 79 % — AB (ref 43–77)
Neutro Abs: 13.2 10*3/uL — ABNORMAL HIGH (ref 1.7–7.7)
PLATELETS: 254 10*3/uL (ref 150–400)
RBC: 3.53 MIL/uL — ABNORMAL LOW (ref 3.87–5.11)
RDW: 13.3 % (ref 11.5–15.5)
WBC: 16.8 10*3/uL — ABNORMAL HIGH (ref 4.0–10.5)

## 2014-01-01 LAB — BASIC METABOLIC PANEL
BUN: 31 mg/dL — ABNORMAL HIGH (ref 6–23)
BUN: 32 mg/dL — ABNORMAL HIGH (ref 6–23)
BUN: 31 mg/dL — ABNORMAL HIGH (ref 6–23)
BUN: 29 mg/dL — AB (ref 6–23)
BUN: 24 mg/dL — ABNORMAL HIGH (ref 6–23)
BUN: 22 mg/dL (ref 6–23)
CHLORIDE: 100 meq/L (ref 96–112)
CHLORIDE: 101 meq/L (ref 96–112)
CHLORIDE: 105 meq/L (ref 96–112)
CHLORIDE: 105 meq/L (ref 96–112)
CO2: 10 meq/L — AB (ref 19–32)
CO2: 12 mEq/L — ABNORMAL LOW (ref 19–32)
CO2: 14 meq/L — AB (ref 19–32)
CO2: 16 meq/L — AB (ref 19–32)
CO2: 16 mEq/L — ABNORMAL LOW (ref 19–32)
CO2: 15 meq/L — AB (ref 19–32)
CREATININE: 0.99 mg/dL (ref 0.50–1.10)
Calcium: 7.9 mg/dL — ABNORMAL LOW (ref 8.4–10.5)
Calcium: 7.8 mg/dL — ABNORMAL LOW (ref 8.4–10.5)
Calcium: 7.7 mg/dL — ABNORMAL LOW (ref 8.4–10.5)
Calcium: 8 mg/dL — ABNORMAL LOW (ref 8.4–10.5)
Calcium: 8.1 mg/dL — ABNORMAL LOW (ref 8.4–10.5)
Calcium: 8.2 mg/dL — ABNORMAL LOW (ref 8.4–10.5)
Chloride: 106 mEq/L (ref 96–112)
Chloride: 102 mEq/L (ref 96–112)
Creatinine, Ser: 1.37 mg/dL — ABNORMAL HIGH (ref 0.50–1.10)
Creatinine, Ser: 1.42 mg/dL — ABNORMAL HIGH (ref 0.50–1.10)
Creatinine, Ser: 1.42 mg/dL — ABNORMAL HIGH (ref 0.50–1.10)
Creatinine, Ser: 1.26 mg/dL — ABNORMAL HIGH (ref 0.50–1.10)
Creatinine, Ser: 1.05 mg/dL (ref 0.50–1.10)
GFR calc Af Amer: 44 mL/min — ABNORMAL LOW (ref >90)
GFR calc Af Amer: 48 mL/min — ABNORMAL LOW (ref >90)
GFR calc non Af Amer: 36 mL/min — ABNORMAL LOW (ref >90)
GFR calc non Af Amer: 36 mL/min — ABNORMAL LOW (ref >90)
GFR calc non Af Amer: 42 mL/min — ABNORMAL LOW (ref >90)
GFR calc non Af Amer: 52 mL/min — ABNORMAL LOW (ref >90)
GFR calc non Af Amer: 56 mL/min — ABNORMAL LOW (ref >90)
GFR, EST AFRICAN AMERICAN: 42 mL/min — AB (ref >90)
GFR, EST AFRICAN AMERICAN: 42 mL/min — AB (ref >90)
GFR, EST AFRICAN AMERICAN: 60 mL/min — AB (ref >90)
GFR, EST AFRICAN AMERICAN: 65 mL/min — AB (ref >90)
GFR, EST NON AFRICAN AMERICAN: 38 mL/min — AB (ref >90)
GLUCOSE: 168 mg/dL — AB (ref 70–99)
Glucose, Bld: 361 mg/dL — ABNORMAL HIGH (ref 70–99)
Glucose, Bld: 311 mg/dL — ABNORMAL HIGH (ref 70–99)
Glucose, Bld: 220 mg/dL — ABNORMAL HIGH (ref 70–99)
Glucose, Bld: 127 mg/dL — ABNORMAL HIGH (ref 70–99)
Glucose, Bld: 153 mg/dL — ABNORMAL HIGH (ref 70–99)
POTASSIUM: 4.5 meq/L (ref 3.7–5.3)
POTASSIUM: 4 meq/L (ref 3.7–5.3)
Potassium: 4.3 mEq/L (ref 3.7–5.3)
Potassium: 3.8 mEq/L (ref 3.7–5.3)
Potassium: 3.7 mEq/L (ref 3.7–5.3)
Potassium: 3.6 mEq/L — ABNORMAL LOW (ref 3.7–5.3)
SODIUM: 135 meq/L — AB (ref 137–147)
SODIUM: 134 meq/L — AB (ref 137–147)
Sodium: 137 mEq/L (ref 137–147)
Sodium: 136 mEq/L — ABNORMAL LOW (ref 137–147)
Sodium: 138 mEq/L (ref 137–147)
Sodium: 135 mEq/L — ABNORMAL LOW (ref 137–147)

## 2014-01-01 LAB — GLUCOSE, CAPILLARY
GLUCOSE-CAPILLARY: 388 mg/dL — AB (ref 70–99)
GLUCOSE-CAPILLARY: 260 mg/dL — AB (ref 70–99)
GLUCOSE-CAPILLARY: 141 mg/dL — AB (ref 70–99)
GLUCOSE-CAPILLARY: 158 mg/dL — AB (ref 70–99)
GLUCOSE-CAPILLARY: 125 mg/dL — AB (ref 70–99)
GLUCOSE-CAPILLARY: 124 mg/dL — AB (ref 70–99)
GLUCOSE-CAPILLARY: 145 mg/dL — AB (ref 70–99)
GLUCOSE-CAPILLARY: 153 mg/dL — AB (ref 70–99)
Glucose-Capillary: 318 mg/dL — ABNORMAL HIGH (ref 70–99)
Glucose-Capillary: 373 mg/dL — ABNORMAL HIGH (ref 70–99)
Glucose-Capillary: 440 mg/dL — ABNORMAL HIGH (ref 70–99)
Glucose-Capillary: 266 mg/dL — ABNORMAL HIGH (ref 70–99)
Glucose-Capillary: 332 mg/dL — ABNORMAL HIGH (ref 70–99)
Glucose-Capillary: 185 mg/dL — ABNORMAL HIGH (ref 70–99)
Glucose-Capillary: 175 mg/dL — ABNORMAL HIGH (ref 70–99)
Glucose-Capillary: 198 mg/dL — ABNORMAL HIGH (ref 70–99)
Glucose-Capillary: 127 mg/dL — ABNORMAL HIGH (ref 70–99)
Glucose-Capillary: 167 mg/dL — ABNORMAL HIGH (ref 70–99)
Glucose-Capillary: 571 mg/dL (ref 70–99)
Glucose-Capillary: 107 mg/dL — ABNORMAL HIGH (ref 70–99)
Glucose-Capillary: 110 mg/dL — ABNORMAL HIGH (ref 70–99)
Glucose-Capillary: 119 mg/dL — ABNORMAL HIGH (ref 70–99)
Glucose-Capillary: 127 mg/dL — ABNORMAL HIGH (ref 70–99)
Glucose-Capillary: 132 mg/dL — ABNORMAL HIGH (ref 70–99)
Glucose-Capillary: 126 mg/dL — ABNORMAL HIGH (ref 70–99)
Glucose-Capillary: 129 mg/dL — ABNORMAL HIGH (ref 70–99)

## 2014-01-01 LAB — CORTISOL: Cortisol, Plasma: 26.7 ug/dL

## 2014-01-01 LAB — HEMOGLOBIN A1C
Hgb A1c MFr Bld: 9.9 % — ABNORMAL HIGH (ref <5.7)
Mean Plasma Glucose: 237 mg/dL — ABNORMAL HIGH (ref <117)

## 2014-01-01 LAB — TROPONIN I
Troponin I: <0.3 ng/mL (ref <0.30)
Troponin I: <0.3 ng/mL (ref <0.30)

## 2014-01-01 LAB — TSH: TSH: 29.627 u[IU]/mL — ABNORMAL HIGH (ref 0.350–4.500)

## 2014-01-01 LAB — MRSA PCR SCREENING: MRSA by PCR: POSITIVE — AB

## 2014-01-01 NOTE — Progress Notes (Addendum)
Patient seen, examined and discussed with the nurse practitioner. Patient is still in DKA.  Agree with her note. Continue serial basic metabolic panel, IV insulin and fluids. Continue IV antibiotics for UTI. Following fluid resuscitation, we'll need to reassess volume input given history of diastolic heart failure. Continue step down unit. Explained to family at bedside. Pt meets criteria for severe MALNUTRITION in the context of chronic illness as evidenced by 19% wt loss x 5 months and intake of <75% x at least 1 month.

## 2014-01-01 NOTE — Clinical Documentation Improvement (Addendum)
Possible Clinical Conditions?  Please clarify the diagnosis, there is currently conflicting documentation in the chart regarding sepsis.   Severe Sepsis Sepsis with UTI Other Condition  Cannot clinically Determine   Risk Factors: Per  12/31/12 ED progress note: Hypotension related to dehydration and volume depletion, without evidence for frank sepsis.  Per 01/01/14 H&P :UTI with possible sepsis - patient has been placed on ceftriaxone.  I do not think this patient has sepsis.  Just UTI causing DKA.  Thank You, Serena Colonel ,RN Clinical Documentation Specialist:  [place] [facility]

## 2014-01-01 NOTE — Clinical Documentation Improvement (Signed)
Possible Clinical Conditions?  Severe Malnutrition   Protein Calorie Malnutrition Severe Protein Calorie Malnutrition Other Condition Cannot clinically determine  Supporting Information Per 01/01/14 RD progress note: Pt meets criteria for severe MALNUTRITION in the context of chronic illness as evidenced by 19% wt loss x 5 months and intake of <75% x at least 1 month.   Interventions: Recommend diet advancement to least restricted diet per team's discretion.  Consider appetite stimulant.  RD to continue to follow nutrition care plan.     Thank You, Serena Colonel ,RN Clinical Documentation Specialist:  [place] [facility]

## 2014-01-01 NOTE — Progress Notes (Signed)
Pt transferred from the ED around 2200, admitted to Rm/3s06. Pt comes from [facility]. She is arousable  To painful stemuli. No skin breakdown noted, sacrum is red but blanches. Right arm in sling for fractured humerous from previous fall. Placed on telemetry, Junctional rhythm(MD aware). Resting comfortably at this time, will continue to monitor

## 2014-01-01 NOTE — Progress Notes (Signed)
Utilization review completed.  

## 2014-01-01 NOTE — Progress Notes (Signed)
[facility]  Regina [name] PNT:[id number] DOB: Dec 24, 1940 DOA: 12/31/2013 PCP: Delia Chimes, NP  Brief narrative: 73 year old female patient resident of skilled nursing facility. Sent from the nursing home because of altered mentation related to increasing lethargy. According to the patient's family patient with recent right humerus fracture and was to follow with Dr. Percell Miller as an outpatient. Because of significant discomfort she was placed on pain medications and placed in a sling. Over 2-3 days patient began having increasing thirst but no other symptoms reported. She was lethargic on the morning of presentation.  Upon arrival to the ER laboratory data demonstrated patient and significant DKA. She was also hypotensive and required 4 L of normal saline upon arrival. Urinalysis was concerning for UTI. According to the family the patient has recurrent urinary tract infections with similar presentations.  Assessment/Plan:   DKA (diabetic ketoacidoses) -Anion gap this morning still elevated at 15 -Continue insulin infusion and other supportive care -Was on Levemir prior to arrival    Frequent UTI -Family endorses frequent recurrence of urinary tract infection -Followup on cultures and otherwise continue Rocephin    Acute renal failure -BUN and creatinine mildly elevated -Continue hydration -Keep blood pressure greater than 90    ANEMIA-IRON DEFICIENCY -On iron supplementation prior to admission    HTN (hypertension) -Currently BP remains soft so home medications of Norvasc, Coreg remain on hold    Dehydration -Continue IV fluids cautiously given underlying diastolic dysfunction -Still seem severely dehydrated    Moderate aortic stenosis -Followed by Dr. Debara Pickett as an outpatient -Has a harsh blowing systolic murmur    HYPOTHYROIDISM -Continue Synthroid if unable to take oral we'll give IV    History of CVA (cerebrovascular accident) -On  Aggrenox prior to admission    Chronic diastolic CHF (congestive heart failure) -Controlled blood pressure to be cautious with administration of IV fluids    Dementia -Was on Namenda prior to admission -Has documented problem with failure to thrive    DVT prophylaxis: SCDs Code Status: DO NOT RESUSCITATE Family Communication: Daughter at bedside Disposition Plan/Expected LOS: Step down   Consultants: None  Procedures: None  Antibiotics: Rocephin 3/22 >>>  HPI/Subjective: Patient is quite lethargic and unable to answer questions. No apparent symptoms prior to arrival in regards to abdominal or urinary tract symptoms according to family.  Objective: Blood pressure 96/44, pulse 63, temperature 99.1 F (37.3 C), temperature source Axillary, resp. rate 18, height 5\' 2"  (1.575 m), weight 191 lb 2.2 oz (86.7 kg), SpO2 96.00%.  Intake/Output Summary (Last 24 hours) at 01/01/14 1244 Last data filed at 01/01/14 0700  Gross per 24 hour  Intake   1150 ml  Output      0 ml  Net   1150 ml     Exam: General: No acute respiratory distress Lungs: Clear to auscultation yet diminished bilaterally without wheezes or crackles, RA Cardiovascular: Regular rate and rhythm, harsh blowing systolic murmur second intercostal space adjacent to the sternum, no peripheral edema or JVD Abdomen: Nontender, nondistended, soft, bowel sounds positive, no rebound, no ascites, no appreciable mass Musculoskeletal: No significant cyanosis, clubbing of bilateral lower extremities Neurological: Lethargic and minimally responsive. Does not open eyes or attempt to verbalize.  Scheduled Meds:  Scheduled Meds: . cefTRIAXone (ROCEPHIN)  IV  1 g Intravenous Q24H  . latanoprost  1 drop Both Eyes QHS  . levothyroxine  37.5 mcg Intravenous QAC breakfast   Continuous Infusions: . sodium chloride Stopped (  01/01/14 0512)  . dextrose 5 % and 0.45% NaCl 125 mL/hr at 01/01/14 0512  . insulin (NOVOLIN-R) infusion  13.8 Units/hr (01/01/14 0619)    Data Reviewed: Basic Metabolic Panel:  Recent Labs Lab 12/31/13 2303 01/01/14 0100 01/01/14 0302 01/01/14 0500 01/01/14 0900  NA 133* 135* 134* 137 136*  K 4.2 4.3 4.5 4.0 3.8  CL 96 100 101 105 105  CO2 8* 10* 12* 14* 16*  GLUCOSE 423* 361* 311* 220* 168*  BUN 31* 31* 32* 31* 29*  CREATININE 1.38* 1.37* 1.42* 1.42* 1.26*  CALCIUM 8.0* 7.9* 7.8* 7.7* 8.0*   Liver Function Tests:  Recent Labs Lab 12/31/13 1930  AST 19  ALT 28  ALKPHOS 93  BILITOT 0.4  PROT 5.3*  ALBUMIN 2.4*    Recent Labs Lab 12/31/13 1722  LIPASE 18   No results found for this basename: AMMONIA,  in the last 168 hours CBC:  Recent Labs Lab 12/31/13 1930 12/31/13 2303 01/01/14 0302  WBC 17.5* 20.9* 16.8*  NEUTROABS 14.5*  --  13.2*  HGB 11.1* 10.9* 10.5*  HCT 32.8* 31.4* 30.3*  MCV 88.9 86.7 85.8  PLT 242 272 254   Cardiac Enzymes:  Recent Labs Lab 12/31/13 1930 12/31/13 2303 01/01/14 0500 01/01/14 0900  TROPONINI <0.30 <0.30 <0.30 <0.30   BNP (last 3 results) No results found for this basename: PROBNP,  in the last 8760 hours CBG:  Recent Labs Lab 01/01/14 0934 01/01/14 1000 01/01/14 1107 01/01/14 1145 01/01/14 1212  GLUCAP 158* 141* 125* 124* 127*    Recent Results (from the past 240 hour(s))  MRSA PCR SCREENING     Status: Abnormal   Collection Time    12/31/13 10:05 PM      Result Value Ref Range Status   MRSA by PCR POSITIVE (*) NEGATIVE Final   Comment:            The GeneXpert MRSA Assay (FDA     approved for NASAL specimens     only), is one component of a     comprehensive MRSA colonization     surveillance program. It is not     intended to diagnose MRSA     infection nor to guide or     monitor treatment for     MRSA infections.     RESULT CALLED TO, READ BACK BY AND VERIFIED WITH:     BROWN,R RN 0004 01/01/14 MITCHELL,L     Studies:  Recent x-ray studies have been reviewed in detail by the Attending  Physician  Time spent :     Erin Hearing, [place] [facility] Office  [phone] Pager [phone]  **If unable to reach the above provider after paging please contact the [place] @ [phone]  On-Call/Text Page:      [email or web address]      password [email or web address]  If 7PM-7AM, please contact night-coverage [email or web address] Password [facility] 01/01/2014, 12:44 PM   LOS: 1 day

## 2014-01-01 NOTE — Progress Notes (Signed)
INITIAL NUTRITION ASSESSMENT  DOCUMENTATION CODES Per approved criteria  -Severe malnutrition in the context of chronic illness -Obesity Unspecified   INTERVENTION: Recommend diet advancement to least restricted diet per team's discretion. Consider appetite stimulant. RD to continue to follow nutrition care plan.  NUTRITION DIAGNOSIS: Inadequate oral intake related to poor appetite as evidenced by family report and ongoing weight loss.   Goal: Diet advancement. Intake to meet >90% of estimated nutrition needs.  Monitor:  weight trends, lab trends, I/O's, PO intake, supplement tolerance  Reason for Assessment: Malnutrition Screening Tool + Low Braden Score  73 y.o. female  Admitting Dx: DKA (diabetic ketoacidoses)  ASSESSMENT: PMHx significant for DM, hypothyroidism, HTN, hyperlipidemia, legally blind, CVA, vulvar CA. Recent R humerus fracture. From SNF. Admitted with AMS and increased thirst. Work-up reveals severe DKA precipitated by UTI.  Daughter reports trying to give pt Ensure and Glucerna supplements but, pt does not like them. Pt did eat Magic Cup ice cream during previous admission.  Daughter reports that pt has not been eating well at her SNF for a few weeks. Last time appetite was "normal" was >6 months ago. Daughter reports that she bathes her mother and that she has noticed considerable weight loss in stomach and legs. Pt with recent humerus fracture, resulting in her needing to be fed. Pt often eating just fruits or sweets during the meals. Eats very little protein, per family.  Pt meets criteria for severe MALNUTRITION in the context of chronic illness as evidenced by 19% wt loss x 5 months and intake of <75% x at least 1 month.  HgbA1c is 9.9 CBG's: 125, 124, 127  Height: Ht Readings from Last 1 Encounters:  12/31/13 5\' 2"  (1.575 m)    Weight: Wt Readings from Last 1 Encounters:  12/31/13 191 lb 2.2 oz (86.7 kg)    Ideal Body Weight: 110 lb  % Ideal  Body Weight: 174%  Wt Readings from Last 10 Encounters:  12/31/13 191 lb 2.2 oz (86.7 kg)  11/26/13 192 lb (87.091 kg)  10/11/13 201 lb 4.8 oz (91.309 kg)  09/29/13 201 lb 4.8 oz (91.309 kg)  07/18/13 233 lb 11 oz (106 kg)  06/08/13 229 lb 1.6 oz (103.919 kg)  06/05/13 233 lb 4 oz (105.8 kg)  04/20/07 264 lb (119.75 kg)    Usual Body Weight: 235 lb  % Usual Body Weight: 81%  BMI:  Body mass index is 34.95 kg/(m^2). Obese Class I  Estimated Nutritional Needs: Kcal: 1500 - 1750 Protein: 70 - 80 g Fluid: 1.8 - 2 liters daily  Skin: intact  Diet Order: NPO  EDUCATION NEEDS: -No education needs identified at this time   Intake/Output Summary (Last 24 hours) at 01/01/14 1331 Last data filed at 01/01/14 0700  Gross per 24 hour  Intake   1150 ml  Output      0 ml  Net   1150 ml    Last BM: PTA  Labs:   Recent Labs Lab 01/01/14 0302 01/01/14 0500 01/01/14 0900  NA 134* 137 136*  K 4.5 4.0 3.8  CL 101 105 105  CO2 12* 14* 16*  BUN 32* 31* 29*  CREATININE 1.42* 1.42* 1.26*  CALCIUM 7.8* 7.7* 8.0*  GLUCOSE 311* 220* 168*    CBG (last 3)   Recent Labs  01/01/14 1107 01/01/14 1145 01/01/14 1212  GLUCAP 125* 124* 127*   Lab Results  Component Value Date   HGBA1C 9.9* 12/31/2013    Scheduled Meds: . cefTRIAXone (  ROCEPHIN)  IV  1 g Intravenous Q24H  . latanoprost  1 drop Both Eyes QHS  . levothyroxine  37.5 mcg Intravenous QAC breakfast    Continuous Infusions: . sodium chloride Stopped (01/01/14 0512)  . dextrose 5 % and 0.45% NaCl 125 mL/hr at 01/01/14 0512  . insulin (NOVOLIN-R) infusion 13.8 Units/hr (01/01/14 5701)    Past Medical History  Diagnosis Date  . Hypertension   . Hyperlipidemia   . TIA (transient ischemic attack) 1990's    "mini strokes" (06/07/2013)  . Legally blind     "both eyes; some vision in the right' (06/07/2013)  . Heart murmur 1960's  . Hypothyroidism   . Type II diabetes mellitus   . Anemia   . GERD  (gastroesophageal reflux disease)   . Arthritis     "left leg" (06/07/2013)  . Depression   . Vulva cancer   . Altered mental status     "the first time I noticed any problem was 2 d ago" (06/07/2013)  . Aortic stenosis     mod-severe    Past Surgical History  Procedure Laterality Date  . Femur fracture surgery Left 1999  . Cesarean section  1968  . Tubal ligation  1970's  . Vulva surgery  1990's    "cut a big chunk out for cancer" (06/07/2013)  . Transthoracic echocardiogram  02/2013    EF 55-60%, mod conc hypertrophy, grade 1 diastolic dysfunction; AV with mod stenosis; calcified MV annulus & mild MR; LA severely dilated; PA peak pressure 43mmHg    Inda Coke MS, RD, LDN Inpatient Registered Dietitian Pager: [phone] After-hours pager: [phone]

## 2014-01-01 NOTE — Progress Notes (Signed)
I & O cath; returned purulent discharge, then Pt had incont episode.

## 2014-01-02 LAB — GLUCOSE, CAPILLARY
GLUCOSE-CAPILLARY: 180 mg/dL — AB (ref 70–99)
GLUCOSE-CAPILLARY: 194 mg/dL — AB (ref 70–99)
GLUCOSE-CAPILLARY: 183 mg/dL — AB (ref 70–99)
GLUCOSE-CAPILLARY: 179 mg/dL — AB (ref 70–99)
GLUCOSE-CAPILLARY: 167 mg/dL — AB (ref 70–99)
GLUCOSE-CAPILLARY: 156 mg/dL — AB (ref 70–99)
Glucose-Capillary: 182 mg/dL — ABNORMAL HIGH (ref 70–99)
Glucose-Capillary: 179 mg/dL — ABNORMAL HIGH (ref 70–99)
Glucose-Capillary: 180 mg/dL — ABNORMAL HIGH (ref 70–99)
Glucose-Capillary: 189 mg/dL — ABNORMAL HIGH (ref 70–99)
Glucose-Capillary: 167 mg/dL — ABNORMAL HIGH (ref 70–99)
Glucose-Capillary: 161 mg/dL — ABNORMAL HIGH (ref 70–99)
Glucose-Capillary: 164 mg/dL — ABNORMAL HIGH (ref 70–99)
Glucose-Capillary: 165 mg/dL — ABNORMAL HIGH (ref 70–99)
Glucose-Capillary: 149 mg/dL — ABNORMAL HIGH (ref 70–99)
Glucose-Capillary: 132 mg/dL — ABNORMAL HIGH (ref 70–99)
Glucose-Capillary: 123 mg/dL — ABNORMAL HIGH (ref 70–99)

## 2014-01-02 LAB — BASIC METABOLIC PANEL
BUN: 19 mg/dL (ref 6–23)
BUN: 14 mg/dL (ref 6–23)
BUN: 10 mg/dL (ref 6–23)
BUN: 17 mg/dL (ref 6–23)
CALCIUM: 8 mg/dL — AB (ref 8.4–10.5)
CALCIUM: 8.1 mg/dL — AB (ref 8.4–10.5)
CALCIUM: 8.1 mg/dL — AB (ref 8.4–10.5)
CHLORIDE: 103 meq/L (ref 96–112)
CHLORIDE: 103 meq/L (ref 96–112)
CO2: 14 mEq/L — ABNORMAL LOW (ref 19–32)
CO2: 16 meq/L — AB (ref 19–32)
CO2: 17 mEq/L — ABNORMAL LOW (ref 19–32)
CO2: 19 meq/L (ref 19–32)
CREATININE: 0.89 mg/dL (ref 0.50–1.10)
CREATININE: 0.79 mg/dL (ref 0.50–1.10)
CREATININE: 0.7 mg/dL (ref 0.50–1.10)
Calcium: 8 mg/dL — ABNORMAL LOW (ref 8.4–10.5)
Chloride: 103 mEq/L (ref 96–112)
Chloride: 103 mEq/L (ref 96–112)
Creatinine, Ser: 0.94 mg/dL (ref 0.50–1.10)
GFR calc Af Amer: 69 mL/min — ABNORMAL LOW (ref >90)
GFR calc Af Amer: 74 mL/min — ABNORMAL LOW (ref >90)
GFR calc Af Amer: >90 mL/min (ref >90)
GFR calc non Af Amer: 64 mL/min — ABNORMAL LOW (ref >90)
GFR calc non Af Amer: 82 mL/min — ABNORMAL LOW (ref >90)
GFR, EST NON AFRICAN AMERICAN: 60 mL/min — AB (ref >90)
GFR, EST NON AFRICAN AMERICAN: 85 mL/min — AB (ref >90)
GLUCOSE: 206 mg/dL — AB (ref 70–99)
GLUCOSE: 179 mg/dL — AB (ref 70–99)
Glucose, Bld: 195 mg/dL — ABNORMAL HIGH (ref 70–99)
Glucose, Bld: 166 mg/dL — ABNORMAL HIGH (ref 70–99)
Potassium: 3.6 mEq/L — ABNORMAL LOW (ref 3.7–5.3)
Potassium: 3.2 mEq/L — ABNORMAL LOW (ref 3.7–5.3)
Potassium: 3 mEq/L — ABNORMAL LOW (ref 3.7–5.3)
Potassium: 2.8 mEq/L — CL (ref 3.7–5.3)
SODIUM: 136 meq/L — AB (ref 137–147)
SODIUM: 136 meq/L — AB (ref 137–147)
Sodium: 136 mEq/L — ABNORMAL LOW (ref 137–147)
Sodium: 136 mEq/L — ABNORMAL LOW (ref 137–147)

## 2014-01-02 LAB — CBC
HEMATOCRIT: 33.2 % — AB (ref 36.0–46.0)
Hemoglobin: 11.8 g/dL — ABNORMAL LOW (ref 12.0–15.0)
MCH: 30.1 pg (ref 26.0–34.0)
MCHC: 35.5 g/dL (ref 30.0–36.0)
MCV: 84.7 fL (ref 78.0–100.0)
Platelets: 204 10*3/uL (ref 150–400)
RBC: 3.92 MIL/uL (ref 3.87–5.11)
RDW: 13.9 % (ref 11.5–15.5)
WBC: 12.5 10*3/uL — ABNORMAL HIGH (ref 4.0–10.5)

## 2014-01-02 MED ORDER — POTASSIUM CHLORIDE 10 MEQ/100ML IV SOLN
10.0000 meq | INTRAVENOUS | Status: AC
  Administered 2014-01-02 (×3): 10 meq via INTRAVENOUS

## 2014-01-02 MED ORDER — KCL IN DEXTROSE-NACL 40-5-0.45 MEQ/L-%-% IV SOLN: INTRAVENOUS | Status: DC

## 2014-01-02 MED ORDER — INSULIN DETEMIR 100 UNIT/ML ~~LOC~~ SOLN
35.0000 [IU] | Freq: Two times a day (BID) | SUBCUTANEOUS | Status: DC
  Administered 2014-01-02 – 2014-01-05 (×5): 35 [IU] via SUBCUTANEOUS
  Filled 2014-01-02 (×9): qty 0.35

## 2014-01-02 MED ORDER — SODIUM CHLORIDE 0.9 % IV SOLN
INTRAVENOUS | Status: DC
  Administered 2014-01-02 – 2014-01-03 (×2): via INTRAVENOUS

## 2014-01-02 MED ORDER — CHLORHEXIDINE GLUCONATE CLOTH 2 % EX PADS
6.0000 | MEDICATED_PAD | Freq: Every day | CUTANEOUS | Status: DC
  Administered 2014-01-02 – 2014-01-05 (×4): 6 via TOPICAL

## 2014-01-02 MED ORDER — INSULIN ASPART 100 UNIT/ML ~~LOC~~ SOLN
0.0000 [IU] | SUBCUTANEOUS | Status: DC
  Administered 2014-01-02: 1 [IU] via SUBCUTANEOUS
  Administered 2014-01-02: 2 [IU] via SUBCUTANEOUS
  Administered 2014-01-03: 1 [IU] via SUBCUTANEOUS
  Administered 2014-01-03: 3 [IU] via SUBCUTANEOUS
  Administered 2014-01-03 (×2): 1 [IU] via SUBCUTANEOUS
  Administered 2014-01-03: 2 [IU] via SUBCUTANEOUS

## 2014-01-02 MED ORDER — POTASSIUM CHLORIDE 10 MEQ/50ML IV SOLN
INTRAVENOUS | Status: AC
  Administered 2014-01-02: 10 meq
  Filled 2014-01-02: qty 50

## 2014-01-02 MED ORDER — MUPIROCIN 2 % EX OINT
TOPICAL_OINTMENT | Freq: Two times a day (BID) | CUTANEOUS | Status: DC
  Administered 2014-01-02 – 2014-01-03 (×4): via NASAL
  Administered 2014-01-04: 1 via NASAL
  Administered 2014-01-04 – 2014-01-05 (×2): via NASAL
  Filled 2014-01-02 (×2): qty 22

## 2014-01-02 NOTE — Progress Notes (Signed)
[facility]  Regina [name] ZOX:[id number] DOB: 05/09/1941 DOA: 12/31/2013 PCP: Delia Chimes, NP  Brief narrative: 73 year old female patient resident of skilled nursing facility. Sent from the nursing home because of altered mentation related to increasing lethargy. According to the patient's family patient with recent right humerus fracture and was to follow with Dr. Percell Miller as an outpatient. Because of significant discomfort she was placed on pain medications and placed in a sling. Over 2-3 days patient began having increasing thirst but no other symptoms reported. She was lethargic on the morning of presentation.  Upon arrival to the ER laboratory data demonstrated patient and significant DKA. She was also hypotensive and required 4 L of normal saline upon arrival. Urinalysis was concerning for UTI. According to the family the patient has recurrent urinary tract infections with similar presentations.  Assessment/Plan:   DKA (diabetic ketoacidoses) -Anion gap this morning still elevated at 16-repeat at 1 pm was 14 so will dc insulin infusion and resume home Levemir and begin SSI -Continue insulin infusion and other supportive care -change maintnance fluids to NS-if CBGs drop can add back dextrose   Metabolic encephalopathy/  Dementia -Was on Namenda prior to admission -Has documented problem with failure to thrive -SLP eval -if passes can pursue regular clear liquids and advance to carb mod    Frequent UTI/history vulvar cancer -Family endorses frequent recurrence of urinary tract infection -Followup on cultures and otherwise continue Rocephin -known diff with ID anatomy due to prior surgery- these changes could also be influencing recurrence of UTIs- may benefit from OP urology eval    Acute renal failure -BUN and creatinine mildly elevated -Continue hydration but decrease rate to 75/hr since BP up -Keep blood pressure greater than 90   Hypokalemia -IV replete    ANEMIA-IRON DEFICIENCY -On iron supplementation prior to admission    HTN (hypertension) -Currently BP remains soft so home medications of Norvasc, Coreg remain on hold    Dehydration -Continue IV fluids cautiously given underlying diastolic dysfunction    Moderate aortic stenosis -Followed by Dr. Debara Pickett as an outpatient -Has a harsh blowing systolic murmur    HYPOTHYROIDISM -Continue Synthroid if unable to take oral we'll give IV    History of CVA (cerebrovascular accident) -On Aggrenox prior to admission    Chronic diastolic CHF (congestive heart failure) -Controlled blood pressure to be cautious with administration of IV fluids (see above)   DVT prophylaxis: SCDs Code Status: DO NOT RESUSCITATE Family Communication: Daughter at bedside Disposition Plan/Expected LOS: Step down   Consultants: None  Procedures: None  Antibiotics: Rocephin 3/22 >>>  HPI/Subjective: Patient is quite lethargic and unable to answer questions. No apparent symptoms prior to arrival in regards to abdominal or urinary tract symptoms according to family.  Objective: Blood pressure 153/80, pulse 86, temperature 99 F (37.2 C), temperature source Axillary, resp. rate 14, height 5\' 2"  (1.575 m), weight 191 lb 2.2 oz (86.7 kg), SpO2 95.00%.  Intake/Output Summary (Last 24 hours) at 01/02/14 1359 Last data filed at 01/02/14 1300  Gross per 24 hour  Intake    875 ml  Output      0 ml  Net    875 ml     Exam: General: No acute respiratory distress Lungs: Clear to auscultation yet diminished bilaterally without wheezes or crackles, RA Cardiovascular: Regular rate and rhythm, harsh blowing systolic murmur second intercostal space adjacent to the sternum, no peripheral edema or JVD Abdomen: Nontender, nondistended,  soft, bowel sounds positive, no rebound, no ascites, no appreciable mass Musculoskeletal: No significant cyanosis, clubbing of bilateral lower  extremities Neurological: Lethargic and minimally responsive. Does not open eyes or attempt to verbalize.  Scheduled Meds:  Scheduled Meds: . cefTRIAXone (ROCEPHIN)  IV  1 g Intravenous Q24H  . Chlorhexidine Gluconate Cloth  6 each Topical Q0600  . latanoprost  1 drop Both Eyes QHS  . levothyroxine  37.5 mcg Intravenous QAC breakfast  . mupirocin ointment   Nasal BID   Continuous Infusions: . sodium chloride Stopped (01/01/14 0512)  . dextrose 5 % and 0.45% NaCl 125 mL/hr at 01/02/14 0600  . insulin (NOVOLIN-R) infusion 5.3 Units/hr (01/02/14 1345)    Data Reviewed: Basic Metabolic Panel:  Recent Labs Lab 01/01/14 2037 01/02/14 0030 01/02/14 0415 01/02/14 0830 01/02/14 1300  NA 135* 136* 136* 136* 136*  K 3.6* 3.6* 3.2* 3.0* 2.8*  CL 102 103 103 103 103  CO2 15* 14* 16* 17* 19  GLUCOSE 153* 206* 195* 179* 166*  BUN 22 19 17 14 10   CREATININE 0.99 0.94 0.89 0.79 0.70  CALCIUM 8.2* 8.0* 8.0* 8.1* 8.1*   Liver Function Tests:  Recent Labs Lab 12/31/13 1930  AST 19  ALT 28  ALKPHOS 93  BILITOT 0.4  PROT 5.3*  ALBUMIN 2.4*    Recent Labs Lab 12/31/13 1722  LIPASE 18   No results found for this basename: AMMONIA,  in the last 168 hours CBC:  Recent Labs Lab 12/31/13 1930 12/31/13 2303 01/01/14 0302 01/02/14 0415  WBC 17.5* 20.9* 16.8* 12.5*  NEUTROABS 14.5*  --  13.2*  --   HGB 11.1* 10.9* 10.5* 11.8*  HCT 32.8* 31.4* 30.3* 33.2*  MCV 88.9 86.7 85.8 84.7  PLT 242 272 254 204   Cardiac Enzymes:  Recent Labs Lab 12/31/13 1930 12/31/13 2303 01/01/14 0500 01/01/14 0900  TROPONINI <0.30 <0.30 <0.30 <0.30   BNP (last 3 results) No results found for this basename: PROBNP,  in the last 8760 hours CBG:  Recent Labs Lab 01/02/14 0808 01/02/14 0916 01/02/14 1023 01/02/14 1138 01/02/14 1228  GLUCAP 167* 161* 165* 164* 156*    Recent Results (from the past 240 hour(s))  URINE CULTURE     Status: None   Collection Time    12/31/13  6:25 PM        Result Value Ref Range Status   Specimen Description URINE, CATHETERIZED   Final   Special Requests NONE   Final   Culture  Setup Time     Final   Value: 01/01/2014 02:18     Performed at [facility] PENDING   Incomplete   Culture     Final   Value: Culture reincubated for better growth     Performed at [facility]   Report Status PENDING   Incomplete  MRSA PCR SCREENING     Status: Abnormal   Collection Time    12/31/13 10:05 PM      Result Value Ref Range Status   MRSA by PCR POSITIVE (*) NEGATIVE Final   Comment:            The GeneXpert MRSA Assay (FDA     approved for NASAL specimens     only), is one component of a     comprehensive MRSA colonization     surveillance program. It is not     intended to diagnose MRSA     infection nor to guide  or     monitor treatment for     MRSA infections.     RESULT CALLED TO, READ BACK BY AND VERIFIED WITH:     BROWN,R RN 0004 01/01/14 MITCHELL,L  CULTURE, BLOOD (ROUTINE X 2)     Status: None   Collection Time    01/01/14  1:25 PM      Result Value Ref Range Status   Specimen Description BLOOD RIGHT HAND   Final   Special Requests BOTTLES DRAWN AEROBIC ONLY 10CC   Final   Culture  Setup Time     Final   Value: 01/01/2014 18:48     Performed at [facility]   Culture     Final   Value:        BLOOD CULTURE RECEIVED NO GROWTH TO DATE CULTURE WILL BE HELD FOR 5 DAYS BEFORE ISSUING A FINAL NEGATIVE REPORT     Performed at [facility]   Report Status PENDING   Incomplete  CULTURE, BLOOD (ROUTINE X 2)     Status: None   Collection Time    01/01/14  1:30 PM      Result Value Ref Range Status   Specimen Description BLOOD LEFT HAND   Final   Special Requests BOTTLES DRAWN AEROBIC ONLY [facility]   Final   Culture  Setup Time     Final   Value: 01/01/2014 18:48     Performed at [facility]   Culture     Final   Value:        BLOOD CULTURE RECEIVED NO GROWTH TO DATE CULTURE WILL  BE HELD FOR 5 DAYS BEFORE ISSUING A FINAL NEGATIVE REPORT     Performed at [facility]   Report Status PENDING   Incomplete     Studies:  Recent x-ray studies have been reviewed in detail by the Attending Physician  Time spent :     Erin Hearing, ANP [facility] Office  [phone] Pager [phone]  **If unable to reach the above provider after paging please contact the [place] @ [phone]  On-Call/Text Page:      [email or web address]      password [email or web address]  If 7PM-7AM, please contact night-coverage [email or web address] Password [email or web address] 01/02/2014, 1:59 PM   LOS: 2 days

## 2014-01-02 NOTE — Progress Notes (Signed)
Patient seen, examined and discussed with my nurse practitioner. Agree with the note. Patient slowly improving. Anion gap finally resolving. Start home Levemir and sliding scale insulin. Hopefully will wake up more. Continue to treat urinary tract infection. Continue in step down for now and likely transfer to floor tomorrow.

## 2014-01-03 LAB — BASIC METABOLIC PANEL
BUN: 5 mg/dL — AB (ref 6–23)
CALCIUM: 7.9 mg/dL — AB (ref 8.4–10.5)
CO2: 22 mEq/L (ref 19–32)
CREATININE: 0.6 mg/dL (ref 0.50–1.10)
Chloride: 101 mEq/L (ref 96–112)
GFR calc Af Amer: >90 mL/min (ref >90)
GFR calc non Af Amer: 90 mL/min — ABNORMAL LOW (ref >90)
GLUCOSE: 151 mg/dL — AB (ref 70–99)
Potassium: 3.3 mEq/L — ABNORMAL LOW (ref 3.7–5.3)
Sodium: 137 mEq/L (ref 137–147)

## 2014-01-03 LAB — CBC
HCT: 35.6 % — ABNORMAL LOW (ref 36.0–46.0)
HEMOGLOBIN: 12.7 g/dL (ref 12.0–15.0)
MCH: 30.2 pg (ref 26.0–34.0)
MCHC: 35.7 g/dL (ref 30.0–36.0)
MCV: 84.8 fL (ref 78.0–100.0)
Platelets: 169 10*3/uL (ref 150–400)
RBC: 4.2 MIL/uL (ref 3.87–5.11)
RDW: 13.8 % (ref 11.5–15.5)
WBC: 8.9 10*3/uL (ref 4.0–10.5)

## 2014-01-03 LAB — GLUCOSE, CAPILLARY
GLUCOSE-CAPILLARY: 138 mg/dL — AB (ref 70–99)
GLUCOSE-CAPILLARY: 191 mg/dL — AB (ref 70–99)
GLUCOSE-CAPILLARY: 91 mg/dL (ref 70–99)
Glucose-Capillary: 163 mg/dL — ABNORMAL HIGH (ref 70–99)
Glucose-Capillary: 147 mg/dL — ABNORMAL HIGH (ref 70–99)
Glucose-Capillary: 214 mg/dL — ABNORMAL HIGH (ref 70–99)
Glucose-Capillary: 128 mg/dL — ABNORMAL HIGH (ref 70–99)
Glucose-Capillary: 90 mg/dL (ref 70–99)
Glucose-Capillary: 88 mg/dL (ref 70–99)

## 2014-01-03 LAB — URINE CULTURE: Colony Count: >=100000

## 2014-01-03 MED ORDER — LEVOTHYROXINE SODIUM 75 MCG PO TABS
75.0000 ug | ORAL_TABLET | Freq: Every day | ORAL | Status: DC
  Administered 2014-01-04 – 2014-01-05 (×2): 75 ug via ORAL
  Filled 2014-01-03 (×3): qty 1

## 2014-01-03 MED ORDER — POTASSIUM CHLORIDE CRYS ER 20 MEQ PO TBCR
40.0000 meq | EXTENDED_RELEASE_TABLET | ORAL | Status: DC | PRN
  Administered 2014-01-03: 40 meq via ORAL
  Filled 2014-01-03: qty 2

## 2014-01-03 MED ORDER — BIOTENE DRY MOUTH MT LIQD
15.0000 mL | Freq: Two times a day (BID) | OROMUCOSAL | Status: DC
  Administered 2014-01-03 – 2014-01-05 (×6): 15 mL via OROMUCOSAL

## 2014-01-03 MED ORDER — CHLORHEXIDINE GLUCONATE 0.12 % MT SOLN
15.0000 mL | Freq: Two times a day (BID) | OROMUCOSAL | Status: DC
  Administered 2014-01-03 – 2014-01-05 (×5): 15 mL via OROMUCOSAL
  Filled 2014-01-03 (×7): qty 15

## 2014-01-03 NOTE — Progress Notes (Addendum)
Report given to RN,5W, all questions answered.  No complaints. Daughter, [name] notified of new room number.

## 2014-01-03 NOTE — Evaluation (Signed)
Clinical/Bedside Swallow Evaluation Patient Details  Name: Regina [name] MRN: [id number] Date of Birth: April 11, 1941  Today's Date: 01/03/2014 Time: 3818-2993 SLP Time Calculation (min): 20 min  Past Medical History:  Past Medical History  Diagnosis Date  . Hypertension   . Hyperlipidemia   . TIA (transient ischemic attack) 1990's    "mini strokes" (06/07/2013)  . Legally blind     "both eyes; some vision in the right' (06/07/2013)  . Heart murmur 1960's  . Hypothyroidism   . Type II diabetes mellitus   . Anemia   . GERD (gastroesophageal reflux disease)   . Arthritis     "left leg" (06/07/2013)  . Depression   . Vulva cancer   . Altered mental status     "the first time I noticed any problem was 2 d ago" (06/07/2013)  . Aortic stenosis     mod-severe   Past Surgical History:  Past Surgical History  Procedure Laterality Date  . Femur fracture surgery Left 1999  . Cesarean section  1968  . Tubal ligation  1970's  . Vulva surgery  1990's    "cut a big chunk out for cancer" (06/07/2013)  . Transthoracic echocardiogram  02/2013    EF 55-60%, mod conc hypertrophy, grade 1 diastolic dysfunction; AV with mod stenosis; calcified MV annulus & mild MR; LA severely dilated; PA peak pressure 52mmHg   HPI:  Pt is a 73 y.o. female with a history of diabetes mellitus, hypothyroidism, hypertension, hyperlipidemia, legally blind, previous history of stroke, history of vulvar cancer was brought to the ER from SNF after patient was found to be increasingly lethargic since morning. As per patient's family patient has had a right humerus fracture last week and is to follow with Dr. Percell Miller and Para March as outpatient conservative management.  The last 2-3 days patient has been having increasing thirst. Since patient became lethargic since morning patient was brought to the ER and labs revealed patient is in severe DKA. However patient was found to be hypotensive and at this time has received 4 L of normal  saline. UA also shows features concerning for UTI.    Assessment / Plan / Recommendation Clinical Impression  Pt without overt s/s of aspiration noted with consistencies assessed; normal oropharyngeal swallow with exception of slow but thorough mastication; pt lethargic throughout BSE with moderate prompting required to perform various tasks; nursing reports pt lethargic all morning, but would wake up intermittently when prompted; pt requires assistance with self-feeding d/t recent arm fx; recommend Dysphagia 2 with thin; no straws; general swallow precautions of slow rate/small bites/sips, seated upright during meals/snacks; speech to f/u x1 for possible diet upgrade    Aspiration Risk  None    Diet Recommendation Dysphagia 2 (Fine chop);Thin liquid   Liquid Administration via: Cup Medication Administration: Whole meds with puree Supervision: Staff to assist with self feeding Compensations: Slow rate;Small sips/bites (No straws initially) Postural Changes and/or Swallow Maneuvers: Seated upright 90 degrees;Upright 30-60 min after meal    Other  Recommendations Oral Care Recommendations: Oral care BID   Follow Up Recommendations  Skilled Nursing facility    Frequency and Duration min 2x/week  1 week   Pertinent Vitals/Pain WDL    SLP Swallow Goals  Pt will tolerate upgraded diet per SLP discretion   Swallow Study Prior Functional Status   Pt is a resident at a SNF; requires assistance with ADL's; unknown previous diet    General Date of Onset: 12/31/13 HPI: Pt is a 73  y.o. female with a history of diabetes mellitus, hypothyroidism, hypertension, hyperlipidemia, legally blind, previous history of stroke, history of vulvar cancer was brought to the ER from SNF after patient was found to be increasingly lethargic since morning. As per patient's family patient has had a right humerus fracture last week and is to follow with Dr. Percell Miller and Para March as outpatient conservative management.   The last 2-3 days patient has been having increasing thirst. Since patient became lethargic since morning patient was brought to the ER and labs revealed patient is in severe DKA. However patient was found to be hypotensive and at this time has received 4 L of normal saline. UA also shows features concerning for UTI.  Type of Study: Bedside swallow evaluation Diet Prior to this Study: Information not available Temperature Spikes Noted: No Respiratory Status: Room air Behavior/Cognition: Cooperative;Lethargic;Requires cueing Oral Cavity - Dentition: Dentures, top;Missing dentition (dentures loose on top; bottom missing dentition) Self-Feeding Abilities: Needs assist;Other (Comment) (d/t right arm fx) Patient Positioning: Upright in bed Baseline Vocal Quality: Clear;Low vocal intensity Volitional Cough: Weak Volitional Swallow: Able to elicit    Oral/Motor/Sensory Function Overall Oral Motor/Sensory Function: Appears within functional limits for tasks assessed Labial ROM: Other (Comment) (appeared functional, but pt unable to participate fully) Labial Symmetry: Within Functional Limits Labial Strength: Within Functional Limits Labial Sensation: Within Functional Limits Lingual ROM: Within Functional Limits Lingual Symmetry: Within Functional Limits Lingual Strength: Within Functional Limits Lingual Sensation: Within Functional Limits Facial ROM: Within Functional Limits Facial Symmetry: Within Functional Limits Facial Strength: Within Functional Limits Facial Sensation: Within Functional Limits Velum: Within Functional Limits Mandible: Within Functional Limits   Ice Chips Ice chips: Within functional limits Presentation: Spoon   Thin Liquid Thin Liquid: Within functional limits Presentation: Cup;Spoon    Nectar Thick Nectar Thick Liquid: Not tested   Honey Thick Honey Thick Liquid: Not tested   Puree Puree: Within functional limits Presentation: Self Fed   Solid       Solid:  Impaired Presentation: Spoon Oral Phase Impairments: Impaired mastication       Dotty Gonzalo,PAT, CCC-SLP 01/03/2014,11:57 AM

## 2014-01-03 NOTE — Progress Notes (Signed)
[facility]  ALPHA CHOUINARD VQM:[id number] DOB: August 19, 1941 DOA: 12/31/2013 PCP: Delia Chimes, NP  Brief narrative: 73 year old female patient resident of skilled nursing facility. Sent from the nursing home because of altered mentation related to increasing lethargy. According to the patient's family patient with recent right humerus fracture and was to follow with Dr. Percell Miller as an outpatient. Because of significant discomfort she was placed on pain medications and placed in a sling. Over 2-3 days patient began having increasing thirst but no other symptoms reported. She was lethargic on the morning of presentation.  Upon arrival to the ER laboratory data demonstrated patient and significant DKA. She was also hypotensive and required 4 L of normal saline upon arrival. Urinalysis was concerning for UTI. According to the family the patient has recurrent urinary tract infections with similar presentations.  Assessment/Plan:   DKA (diabetic ketoacidoses) -Anion gap closed 3/24 and Levemir was started -changed maintnance fluids to NS 3/24 -CBGs well controlled while NPO but trend up with diet- may need to increase SSI   Metabolic encephalopathy/  Dementia -Was on Namenda prior to admission -more alert today although still drifts off to sleep -Has documented problem with failure to thrive -SLP eval D3 diet    Klebsiella UTI/history vulvar cancer/recurrent UTUI -Family endorses frequent recurrence of urinary tract infection -Resistant to ampicillin- continue Rocephin -blood cx NGTD -known diff with ID anatomy due to prior surgery- these changes could also be influencing recurrence of UTIs- may benefit from OP urology eval    Acute renal failure -BUN and creatinine was mildly elevated but has returned to baseline after hydration -Keep blood pressure greater than 90   Hypokalemia -replete prn    ANEMIA-IRON DEFICIENCY -On iron supplementation prior to  admission    HTN (hypertension) -Currently BP remains soft so home medications of Norvasc, Coreg remain on hold    Dehydration -Continue IV fluids cautiously given underlying diastolic dysfunction    Moderate aortic stenosis -Followed by Dr. Debara Pickett as an outpatient -Has a harsh blowing systolic murmur    HYPOTHYROIDISM -Continue Synthroid    History of CVA (cerebrovascular accident) -On Aggrenox prior to admission    Chronic diastolic CHF (congestive heart failure) -Controlled blood pressure to be cautious with administration of IV fluids (see above)   DVT prophylaxis: SCDs Code Status: DO NOT RESUSCITATE Family Communication: Daughter at bedside Disposition Plan/Expected LOS: Transfer to floor   Consultants: None  Procedures: None  Antibiotics: Rocephin 3/22 >>>  HPI/Subjective: Patient much less lethargic and awakens and answers orientation questions-no complaints  Objective: Blood pressure 124/80, pulse 87, temperature 98.4 F (36.9 C), temperature source Oral, resp. rate 16, height 5\' 2"  (1.575 m), weight 191 lb 2.2 oz (86.7 kg), SpO2 95.00%.  Intake/Output Summary (Last 24 hours) at 01/03/14 1443 Last data filed at 01/03/14 1300  Gross per 24 hour  Intake   1895 ml  Output      0 ml  Net   1895 ml     Exam: General: No acute respiratory distress Lungs: Clear to auscultation yet diminished bilaterally without wheezes or crackles, RA Cardiovascular: Regular rate and rhythm, harsh blowing systolic murmur second intercostal space adjacent to the sternum, no peripheral edema or JVD Abdomen: Nontender, nondistended, soft, bowel sounds positive, no rebound, no ascites, no appreciable mass Musculoskeletal: No significant cyanosis, clubbing of bilateral lower extremities Neurological: Awakens, oriented x 3, still sleepy  Scheduled Meds:  Scheduled Meds: . antiseptic oral rinse  15 mL Mouth Rinse q12n4p  . cefTRIAXone (ROCEPHIN)  IV  1 g Intravenous Q24H  .  chlorhexidine  15 mL Mouth Rinse BID  . Chlorhexidine Gluconate Cloth  6 each Topical Q0600  . insulin aspart  0-9 Units Subcutaneous 6 times per day  . insulin detemir  35 Units Subcutaneous BID  . latanoprost  1 drop Both Eyes QHS  . [START ON 01/04/2014] levothyroxine  75 mcg Oral QAC breakfast  . mupirocin ointment   Nasal BID   Continuous Infusions: . sodium chloride 75 mL/hr at 01/03/14 0439    Data Reviewed: Basic Metabolic Panel:  Recent Labs Lab 01/02/14 0030 01/02/14 0415 01/02/14 0830 01/02/14 1300 01/03/14 0440  NA 136* 136* 136* 136* 137  K 3.6* 3.2* 3.0* 2.8* 3.3*  CL 103 103 103 103 101  CO2 14* 16* 17* 19 22  GLUCOSE 206* 195* 179* 166* 151*  BUN 19 17 14 10  5*  CREATININE 0.94 0.89 0.79 0.70 0.60  CALCIUM 8.0* 8.0* 8.1* 8.1* 7.9*   Liver Function Tests:  Recent Labs Lab 12/31/13 1930  AST 19  ALT 28  ALKPHOS 93  BILITOT 0.4  PROT 5.3*  ALBUMIN 2.4*    Recent Labs Lab 12/31/13 1722  LIPASE 18   No results found for this basename: AMMONIA,  in the last 168 hours CBC:  Recent Labs Lab 12/31/13 1930 12/31/13 2303 01/01/14 0302 01/02/14 0415 01/03/14 0440  WBC 17.5* 20.9* 16.8* 12.5* 8.9  NEUTROABS 14.5*  --  13.2*  --   --   HGB 11.1* 10.9* 10.5* 11.8* 12.7  HCT 32.8* 31.4* 30.3* 33.2* 35.6*  MCV 88.9 86.7 85.8 84.7 84.8  PLT 242 272 254 204 169   Cardiac Enzymes:  Recent Labs Lab 12/31/13 1930 12/31/13 2303 01/01/14 0500 01/01/14 0900  TROPONINI <0.30 <0.30 <0.30 <0.30   BNP (last 3 results) No results found for this basename: PROBNP,  in the last 8760 hours CBG:  Recent Labs Lab 01/02/14 2009 01/02/14 2341 01/03/14 0340 01/03/14 0757 01/03/14 1151  GLUCAP 163* 191* 138* 147* 214*    Recent Results (from the past 240 hour(s))  URINE CULTURE     Status: None   Collection Time    12/31/13  6:25 PM      Result Value Ref Range Status   Specimen Description URINE, CATHETERIZED   Final   Special Requests NONE    Final   Culture  Setup Time     Final   Value: 01/01/2014 02:18     Two isolates with different morphologies were identified as the same organism.The most resistant organism was reported.     Performed at [place]     Final   Value: >=100,000 COLONIES/ML     Performed at [facility]   Culture     Final   Value: KLEBSIELLA PNEUMONIAE     Performed at [facility]   Report Status 01/03/2014 FINAL   Final   Organism ID, Bacteria KLEBSIELLA PNEUMONIAE   Final  MRSA PCR SCREENING     Status: Abnormal   Collection Time    12/31/13 10:05 PM      Result Value Ref Range Status   MRSA by PCR POSITIVE (*) NEGATIVE Final   Comment:            The GeneXpert MRSA Assay (FDA     approved for NASAL specimens     only), is one component of a  comprehensive MRSA colonization     surveillance program. It is not     intended to diagnose MRSA     infection nor to guide or     monitor treatment for     MRSA infections.     RESULT CALLED TO, READ BACK BY AND VERIFIED WITH:     BROWN,R RN 0004 01/01/14 MITCHELL,L  CULTURE, BLOOD (ROUTINE X 2)     Status: None   Collection Time    01/01/14  1:25 PM      Result Value Ref Range Status   Specimen Description BLOOD RIGHT HAND   Final   Special Requests BOTTLES DRAWN AEROBIC ONLY 10CC   Final   Culture  Setup Time     Final   Value: 01/01/2014 18:48     Performed at [facility]   Culture     Final   Value:        BLOOD CULTURE RECEIVED NO GROWTH TO DATE CULTURE WILL BE HELD FOR 5 DAYS BEFORE ISSUING A FINAL NEGATIVE REPORT     Performed at [facility]   Report Status PENDING   Incomplete  CULTURE, BLOOD (ROUTINE X 2)     Status: None   Collection Time    01/01/14  1:30 PM      Result Value Ref Range Status   Specimen Description BLOOD LEFT HAND   Final   Special Requests BOTTLES DRAWN AEROBIC ONLY [facility]   Final   Culture  Setup Time     Final   Value: 01/01/2014 18:48     Performed at  [facility]   Culture     Final   Value:        BLOOD CULTURE RECEIVED NO GROWTH TO DATE CULTURE WILL BE HELD FOR 5 DAYS BEFORE ISSUING A FINAL NEGATIVE REPORT     Performed at [facility]   Report Status PENDING   Incomplete     Studies:  Recent x-ray studies have been reviewed in detail by the Attending Physician  Time spent :     Erin Hearing, ANP [facility]  [phone] Pager [phone]  **If unable to reach the above provider after paging please contact the [place] @ [phone]  On-Call/Text Page:      [email or web address]      password [email or web address]  If 7PM-7AM, please contact night-coverage [email or web address] Password [facility] 01/03/2014, 2:43 PM   LOS: 3 days   I have examined the patient, reviewed the chart and modified the above note which I agree with.   Qianna Clagett,MD Pager # [email or web address] 01/03/2014, 5:39 PM

## 2014-01-03 NOTE — Progress Notes (Signed)
Pts BP elevated. On call NP notified, will continue to monitor.

## 2014-01-03 NOTE — Progress Notes (Signed)
Patient trasfered from [facility] to 5W09 via bed; alert and oriented x 2 (person and place); no complaints of pain; IV saline locked in LAC and fluids running in right TLC; skin intact; ecchymosis on right arm and abdomen and rash on groin area;  Orient patient to room and unit; patient legally blind -  instructed how to use the call bell and  fall risk precautions. Will continue to monitor the patient.

## 2014-01-03 NOTE — Clinical Social Work Psychosocial (Signed)
Clinical Social Work Department BRIEF PSYCHOSOCIAL ASSESSMENT 01/03/2014  Patient:  Regina [name], Regina [name]     Account Number:  [id number]     Admit date:  12/31/2013  Clinical Social Worker:  Lovey Newcomer  Date/Time:  01/03/2014 02:00 PM  Referred by:  Physician  Date Referred:  01/03/2014 Referred for  SNF Placement   Other Referral:   Interview type:  Family Other interview type:   Patient not oriented at time of assessment. CSW spoke with patient's daughter by phone as there is no family at bedside.    PSYCHOSOCIAL DATA Living Status:  FACILITY Admitted from facility:  [place] Level of care:  [place] Primary support name:  Regina [name] Primary support relationship to patient:  CHILD, ADULT Degree of support available:   Support is adequate. Patient has husband and daughter who are both supportive of patient. Daughter states that she visits patient at [facility] every other day.    CURRENT CONCERNS Current Concerns  Post-Acute Placement   Other Concerns:    SOCIAL WORK ASSESSMENT / PLAN CSW spoke with patient's daughter to complete assessment. Patient's daughter Regina [name] confirms that patient is from [facility]. Regina [name] states that patient will return to [facility] upon DC. Patient has been in and out of [facility] for several months. Regina [name] states that patient went to [facility] first, but she decided to move her to [facility] because of its location. Daughter states that the care her mother recieves at [facility] is "okay". Daughter states that she goes to the facility to see her mom every other day and make sure that the facility is "treating her right." Regina [name] states that the patient was out of SNF for a couple of weeks because her insurance would no longer pay for it, but she has Medicaid no and is a long term resident at [facility].   Assessment/plan status:  Psychosocial Support/Ongoing Assessment of Needs Other assessment/ plan:   Complete FL2, Fax    Information/referral to community resources:   CSW contact information given to daughter.    PATIENT'S/FAMILY'S RESPONSE TO PLAN OF CARE: Patient's daughter plans for patient to DC to [facility]. Patient's daughter Regina [name] is content with the care her mother receives at the facility and is happy to have CSW's assistance with getting her mother back to facility. CSW will assist with DC.       Liz Beach, [place], [place], [phone]

## 2014-01-03 NOTE — Progress Notes (Signed)
Inpatient Diabetes Program Recommendations  AACE/ADA: New Consensus Statement on Inpatient Glycemic Control (2013)  Target Ranges:  Prepandial:   less than 140 mg/dL      Peak postprandial:   less than 180 mg/dL (1-2 hours)      Critically ill patients:  140 - 180 mg/dL   Reason for Assessment:  Patient transitioned off insulin drip to Levemir 35 units bid.  CBG's look great.  No recommendations at this time.   Adah Perl, RN, BC-ADM Inpatient Diabetes Coordinator Pager [phone]

## 2014-01-04 DIAGNOSIS — E860 Dehydration: Secondary | ICD-10-CM

## 2014-01-04 DIAGNOSIS — I5032 Chronic diastolic (congestive) heart failure: Secondary | ICD-10-CM

## 2014-01-04 DIAGNOSIS — I509 Heart failure, unspecified: Secondary | ICD-10-CM

## 2014-01-04 LAB — CBC
HCT: 40 % (ref 36.0–46.0)
Hemoglobin: 14.2 g/dL (ref 12.0–15.0)
MCH: 30.1 pg (ref 26.0–34.0)
MCHC: 35.5 g/dL (ref 30.0–36.0)
MCV: 84.9 fL (ref 78.0–100.0)
Platelets: 154 10*3/uL (ref 150–400)
RBC: 4.71 MIL/uL (ref 3.87–5.11)
RDW: 13.4 % (ref 11.5–15.5)
WBC: 8.2 10*3/uL (ref 4.0–10.5)

## 2014-01-04 LAB — GLUCOSE, CAPILLARY
GLUCOSE-CAPILLARY: 101 mg/dL — AB (ref 70–99)
GLUCOSE-CAPILLARY: 94 mg/dL (ref 70–99)
Glucose-Capillary: 82 mg/dL (ref 70–99)
Glucose-Capillary: 199 mg/dL — ABNORMAL HIGH (ref 70–99)
Glucose-Capillary: 246 mg/dL — ABNORMAL HIGH (ref 70–99)

## 2014-01-04 LAB — BASIC METABOLIC PANEL
BUN: 3 mg/dL — ABNORMAL LOW (ref 6–23)
CO2: 24 mEq/L (ref 19–32)
Calcium: 8 mg/dL — ABNORMAL LOW (ref 8.4–10.5)
Chloride: 99 mEq/L (ref 96–112)
Creatinine, Ser: 0.46 mg/dL — ABNORMAL LOW (ref 0.50–1.10)
GFR calc Af Amer: >90 mL/min (ref >90)
Glucose, Bld: 90 mg/dL (ref 70–99)
Potassium: 3.2 mEq/L — ABNORMAL LOW (ref 3.7–5.3)
Sodium: 136 mEq/L — ABNORMAL LOW (ref 137–147)

## 2014-01-04 MED ORDER — INSULIN ASPART 100 UNIT/ML ~~LOC~~ SOLN
0.0000 [IU] | Freq: Three times a day (TID) | SUBCUTANEOUS | Status: DC
  Administered 2014-01-04: 2 [IU] via SUBCUTANEOUS
  Administered 2014-01-05: 1 [IU] via SUBCUTANEOUS
  Administered 2014-01-05: 3 [IU] via SUBCUTANEOUS
  Administered 2014-01-05: 2 [IU] via SUBCUTANEOUS

## 2014-01-04 MED ORDER — POTASSIUM CHLORIDE CRYS ER 20 MEQ PO TBCR
40.0000 meq | EXTENDED_RELEASE_TABLET | Freq: Two times a day (BID) | ORAL | Status: DC
  Administered 2014-01-04 – 2014-01-05 (×3): 40 meq via ORAL
  Filled 2014-01-04 (×4): qty 2

## 2014-01-04 NOTE — Progress Notes (Signed)
Pt having frequent loose stools. Placed on enteric precautions. Will send lab a stool sample to rule out c.diff.

## 2014-01-04 NOTE — Care Management Note (Signed)
    Page 1 of 1   01/05/2014     2:37:11 PM   CARE MANAGEMENT NOTE 01/05/2014  Patient:  CATHE, BILGER   Account Number:  [id number]  Date Initiated:  01/04/2014  Documentation initiated by:  Tomi Bamberger  Subjective/Objective Assessment:   dx dka  admit- from [facility]     Action/Plan:   Anticipated DC Date:  01/05/2014   Anticipated DC Plan:  [place] referral  Clinical Social Worker      DC Planning Services  CM consult      Choice offered to / List presented to:             Status of service:  Completed, signed off Medicare Important Message given?   (If response is "NO", the following Medicare IM given date fields will be blank) Date Medicare IM given:   Date Additional Medicare IM given:    Discharge Disposition:  [place]  Per UR Regulation:  Reviewed for med. necessity/level of care/duration of stay  If discussed at [place] of Stay Meetings, dates discussed:    Comments:  01/05/14 [place], BSN [phone] patient is for dc to [facility] SNF today.  01/04/14 [place], BSN [phone] patient is for possible dc to snf on 3/27. CSW following.

## 2014-01-04 NOTE — Progress Notes (Signed)
[facility] PROGRESS NOTE  Regina [name] FOY:[id number] DOB: [birth year] DOA: 12/31/2013 PCP: Delia Chimes, NP  Assessment/Plan: 73 year old female patient resident of skilled nursing facility. Sent from the nursing home because of altered mentation related to increasing lethargy. According to the patient's family patient with recent right humerus fracture and was to follow with Dr. Percell Miller as an outpatient. Because of significant discomfort she was placed on pain medications and placed in a sling. Over 2-3 days patient began having increasing thirst but no other symptoms reported. She was lethargic on the morning of presentation.  Upon arrival to the ER laboratory data demonstrated patient and significant DKA. She was also hypotensive and required 4 L of normal saline upon arrival. Urinalysis was concerning for UTI. According to the family the patient has recurrent urinary tract infections with similar presentations.   1. DKA (diabetic ketoacidoses) HA1C-9.9 -resolved on IVF, insulin; cont insulin regimen SQ 2. Metabolic encephalopathy/ Dementia  -at baseline; SLP eval D3 diet  3. Klebsiella UTI/history vulvar cancer/recurrent UTUI  -Family endorses frequent recurrence of urinary tract infection  -Resistant to ampicillin- continue Rocephin  -blood cx NGTD  4. Acute renal failure resolved on IVF;  5. Hypokalemia replete prn  6. ANEMIA-IRON DEFICIENCY On iron supplementation prior to admission  7. HTN (hypertension)  -Currently BP remains soft so home medications of Norvasc, Coreg remain on hold  8. Moderate aortic stenosis Followed by Dr. Debara Pickett as an outpatient; no s/sof acute CHF 9. History of CVA (cerebrovascular accident)  -On Aggrenox prior to admission  10. Chronic diastolic CHF (congestive heart failure), clinically stable       Code Status: DNR Family Communication: d/w patient, called daughter at [phone] no answer,. Left a message  (indicate person spoken with, relationship,  and if by phone, the number) Disposition Plan: SNF on 3/27 if stable   Consultants:  None  Procedures:  None  Antibiotics:  Rocephin 3/22 >>>   HPI/Subjective: Alert but confused   Objective: Filed Vitals:   01/04/14 0430  BP: 158/89  Pulse: 65  Temp: 98.2 F (36.8 C)  Resp: 16   No intake or output data in the 24 hours ending 01/04/14 1357 Filed Weights   12/31/13 1927 12/31/13 2159 01/03/14 1748  Weight: 83.008 kg (183 lb) 86.7 kg (191 lb 2.2 oz) 89.54 kg (197 lb 6.4 oz)    Exam:   General:  Alert, but confused  Cardiovascular: s1,s2 rrr  Respiratory: CTA BL  Abdomen: soft, nt, nd  Musculoskeletal: no LE edema   Data Reviewed: Basic Metabolic Panel:  Recent Labs Lab 01/02/14 0415 01/02/14 0830 01/02/14 1300 01/03/14 0440 01/04/14 0620  NA 136* 136* 136* 137 136*  K 3.2* 3.0* 2.8* 3.3* 3.2*  CL 103 103 103 101 99  CO2 16* 17* 19 22 24   GLUCOSE 195* 179* 166* 151* 90  BUN 17 14 10  5* 3*  CREATININE 0.89 0.79 0.70 0.60 0.46*  CALCIUM 8.0* 8.1* 8.1* 7.9* 8.0*   Liver Function Tests:  Recent Labs Lab 12/31/13 1930  AST 19  ALT 28  ALKPHOS 93  BILITOT 0.4  PROT 5.3*  ALBUMIN 2.4*    Recent Labs Lab 12/31/13 1722  LIPASE 18   No results found for this basename: AMMONIA,  in the last 168 hours CBC:  Recent Labs Lab 12/31/13 1930 12/31/13 2303 01/01/14 0302 01/02/14 0415 01/03/14 0440 01/04/14 0620  WBC 17.5* 20.9* 16.8* 12.5* 8.9 8.2  NEUTROABS 14.5*  --  13.2*  --   --   --  HGB 11.1* 10.9* 10.5* 11.8* 12.7 14.2  HCT 32.8* 31.4* 30.3* 33.2* 35.6* 40.0  MCV 88.9 86.7 85.8 84.7 84.8 84.9  PLT 242 272 254 204 169 154   Cardiac Enzymes:  Recent Labs Lab 12/31/13 1930 12/31/13 2303 01/01/14 0500 01/01/14 0900  TROPONINI <0.30 <0.30 <0.30 <0.30   BNP (last 3 results) No results found for this basename: PROBNP,  in the last 8760 hours CBG:  Recent Labs Lab 01/03/14 2247 01/03/14 2353 01/04/14 0404 01/04/14 0801  01/04/14 1227  GLUCAP 88 91 101* 82 94    Recent Results (from the past 240 hour(s))  URINE CULTURE     Status: None   Collection Time    12/31/13  6:25 PM      Result Value Ref Range Status   Specimen Description URINE, CATHETERIZED   Final   Special Requests NONE   Final   Culture  Setup Time     Final   Value: 01/01/2014 02:18     Two isolates with different morphologies were identified as the same organism.The most resistant organism was reported.     Performed at [place]     Final   Value: >=100,000 COLONIES/ML     Performed at [facility]   Culture     Final   Value: KLEBSIELLA PNEUMONIAE     Performed at [facility]   Report Status 01/03/2014 FINAL   Final   Organism ID, Bacteria KLEBSIELLA PNEUMONIAE   Final  MRSA PCR SCREENING     Status: Abnormal   Collection Time    12/31/13 10:05 PM      Result Value Ref Range Status   MRSA by PCR POSITIVE (*) NEGATIVE Final   Comment:            The GeneXpert MRSA Assay (FDA     approved for NASAL specimens     only), is one component of a     comprehensive MRSA colonization     surveillance program. It is not     intended to diagnose MRSA     infection nor to guide or     monitor treatment for     MRSA infections.     RESULT CALLED TO, READ BACK BY AND VERIFIED WITH:     BROWN,R RN 0004 01/01/14 MITCHELL,L  CULTURE, BLOOD (ROUTINE X 2)     Status: None   Collection Time    01/01/14  1:25 PM      Result Value Ref Range Status   Specimen Description BLOOD RIGHT HAND   Final   Special Requests BOTTLES DRAWN AEROBIC ONLY 10CC   Final   Culture  Setup Time     Final   Value: 01/01/2014 18:48     Performed at [facility]   Culture     Final   Value:        BLOOD CULTURE RECEIVED NO GROWTH TO DATE CULTURE WILL BE HELD FOR 5 DAYS BEFORE ISSUING A FINAL NEGATIVE REPORT     Performed at [facility]   Report Status PENDING   Incomplete  CULTURE, BLOOD (ROUTINE X 2)      Status: None   Collection Time    01/01/14  1:30 PM      Result Value Ref Range Status   Specimen Description BLOOD LEFT HAND   Final   Special Requests BOTTLES DRAWN AEROBIC ONLY [place]   Final   Culture  Setup Time     Final   Value: 01/01/2014 18:48     Performed at [facility]   Culture     Final   Value:        BLOOD CULTURE RECEIVED NO GROWTH TO DATE CULTURE WILL BE HELD FOR 5 DAYS BEFORE ISSUING A FINAL NEGATIVE REPORT     Performed at [facility]   Report Status PENDING   Incomplete     Studies: No results found.  Scheduled Meds: . antiseptic oral rinse  15 mL Mouth Rinse q12n4p  . cefTRIAXone (ROCEPHIN)  IV  1 g Intravenous Q24H  . chlorhexidine  15 mL Mouth Rinse BID  . Chlorhexidine Gluconate Cloth  6 each Topical Q0600  . insulin aspart  0-9 Units Subcutaneous 6 times per day  . insulin detemir  35 Units Subcutaneous BID  . latanoprost  1 drop Both Eyes QHS  . levothyroxine  75 mcg Oral QAC breakfast  . mupirocin ointment   Nasal BID   Continuous Infusions: . sodium chloride 75 mL/hr at 01/03/14 0439    Principal Problem:   DKA (diabetic ketoacidoses) Active Problems:   HYPOTHYROIDISM   ANEMIA-IRON DEFICIENCY   Frequent UTI   HTN (hypertension)   History of CVA (cerebrovascular accident)   Dehydration   Acute renal failure   Moderate aortic stenosis   Chronic diastolic CHF (congestive heart failure)   Dementia    Time spent: >35 minutes     Kinnie Feil  [facility] Pager [phone]. If 7PM-7AM, please contact night-coverage at [email or web address], password [facility] 01/04/2014, 1:57 PM  LOS: 4 days

## 2014-01-05 DIAGNOSIS — D509 Iron deficiency anemia, unspecified: Secondary | ICD-10-CM

## 2014-01-05 DIAGNOSIS — E1139 Type 2 diabetes mellitus with other diabetic ophthalmic complication: Secondary | ICD-10-CM

## 2014-01-05 LAB — GLUCOSE, CAPILLARY
GLUCOSE-CAPILLARY: 184 mg/dL — AB (ref 70–99)
Glucose-Capillary: 130 mg/dL — ABNORMAL HIGH (ref 70–99)
Glucose-Capillary: 162 mg/dL — ABNORMAL HIGH (ref 70–99)
Glucose-Capillary: 202 mg/dL — ABNORMAL HIGH (ref 70–99)
Glucose-Capillary: 243 mg/dL — ABNORMAL HIGH (ref 70–99)

## 2014-01-05 LAB — BASIC METABOLIC PANEL
BUN: 4 mg/dL — ABNORMAL LOW (ref 6–23)
CALCIUM: 8.2 mg/dL — AB (ref 8.4–10.5)
CO2: 26 mEq/L (ref 19–32)
CREATININE: 0.51 mg/dL (ref 0.50–1.10)
Chloride: 95 mEq/L — ABNORMAL LOW (ref 96–112)
GFR calc non Af Amer: >90 mL/min (ref >90)
Glucose, Bld: 174 mg/dL — ABNORMAL HIGH (ref 70–99)
Potassium: 3.3 mEq/L — ABNORMAL LOW (ref 3.7–5.3)
Sodium: 136 mEq/L — ABNORMAL LOW (ref 137–147)

## 2014-01-05 MED ORDER — HYDRALAZINE HCL 20 MG/ML IJ SOLN
5.0000 mg | Freq: Once | INTRAMUSCULAR | Status: AC
  Administered 2014-01-05: 5 mg via INTRAVENOUS
  Filled 2014-01-05: qty 1

## 2014-01-05 MED ORDER — POTASSIUM CHLORIDE CRYS ER 20 MEQ PO TBCR: 20.0000 meq | EXTENDED_RELEASE_TABLET | Freq: Every day | ORAL | Status: DC

## 2014-01-05 MED ORDER — INSULIN DETEMIR 100 UNIT/ML ~~LOC~~ SOLN: 40.0000 [IU] | Freq: Two times a day (BID) | SUBCUTANEOUS | Status: DC

## 2014-01-05 MED ORDER — POTASSIUM CHLORIDE CRYS ER 20 MEQ PO TBCR
40.0000 meq | EXTENDED_RELEASE_TABLET | Freq: Once | ORAL | Status: AC
  Administered 2014-01-05: 40 meq via ORAL

## 2014-01-05 NOTE — Progress Notes (Signed)
NUTRITION FOLLOW-UP  DOCUMENTATION CODES Per approved criteria  -Severe malnutrition in the context of chronic illness -Obesity Unspecified   Pt meets criteria for severe MALNUTRITION in the context of chronic illness as evidenced by 19% wt loss x 5 months and intake of <75% x at least 1 month.  INTERVENTION: Encouraged protein intake. Recommend appetite stimulant. RD to continue to follow nutrition care plan.  NUTRITION DIAGNOSIS: Inadequate oral intake related to poor appetite as evidenced by family report and ongoing weight loss. Ongoing.  Goal: Diet advancement. Met. Intake to meet >90% of estimated nutrition needs.  Monitor:  weight trends, lab trends, I/O's, PO intake, supplement tolerance  ASSESSMENT: PMHx significant for DM, hypothyroidism, HTN, hyperlipidemia, legally blind, CVA, vulvar CA. Recent R humerus fracture. From SNF. Admitted with AMS and increased thirst. Work-up reveals severe DKA precipitated by UTI.  BSE completed by SLP on 3/25 with recommendations for Dysphagia 2 diet with thin liquids. Oral intake remains poor, consuming very little. Pt with chronic poor appetite. Historically will not take any supplements.  Having loose stools, c diff negative.  HgbA1c is 9.9 CBG's: 162, 130 , 184  Height: Ht Readings from Last 1 Encounters:  01/03/14 5' (1.524 m)    Weight: Wt Readings from Last 1 Encounters:  01/03/14 197 lb 6.4 oz (89.54 kg)  Admit wt 183 lb  BMI:  Body mass index is 38.55 kg/(m^2). Obese Class II  Estimated Nutritional Needs: Kcal: 1500 - 1750 Protein: 70 - 80 g Fluid: 1.8 - 2 liters daily  Skin: intact  Diet Order: Dysphagia 2; thin liquids   Intake/Output Summary (Last 24 hours) at 01/05/14 1239 Last data filed at 01/05/14 0906  Gross per 24 hour  Intake    118 ml  Output      0 ml  Net    118 ml    Last BM: 3/26  Labs:   Recent Labs Lab 01/03/14 0440 01/04/14 0620 01/05/14 0520  NA 137 136* 136*  K 3.3* 3.2*  3.3*  CL 101 99 95*  CO2 $Re'22 24 26  'rzM$ BUN 5* 3* 4*  CREATININE 0.60 0.46* 0.51  CALCIUM 7.9* 8.0* 8.2*  GLUCOSE 151* 90 174*    CBG (last 3)   Recent Labs  01/05/14 0414 01/05/14 0755 01/05/14 1143  GLUCAP 184* 130* 162*   Lab Results  Component Value Date   HGBA1C 9.9* 12/31/2013    Scheduled Meds: . antiseptic oral rinse  15 mL Mouth Rinse q12n4p  . cefTRIAXone (ROCEPHIN)  IV  1 g Intravenous Q24H  . chlorhexidine  15 mL Mouth Rinse BID  . Chlorhexidine Gluconate Cloth  6 each Topical Q0600  . insulin aspart  0-9 Units Subcutaneous TID WC  . insulin detemir  35 Units Subcutaneous BID  . latanoprost  1 drop Both Eyes QHS  . levothyroxine  75 mcg Oral QAC breakfast  . mupirocin ointment   Nasal BID  . potassium chloride  40 mEq Oral BID    Continuous Infusions:    Inda Coke MS, RD, LDN Inpatient Registered Dietitian Pager: [phone] After-hours pager: [phone]

## 2014-01-05 NOTE — Discharge Summary (Signed)
Physician Discharge Summary  Regina [name] URK:[id number] DOB: 22-Jul-1941 DOA: 12/31/2013  PCP: Delia Chimes, NP  Admit date: 12/31/2013 Discharge date: 01/05/2014  Time spent:>35 minutes  Recommendations for Outpatient Follow-up:  SNF F/u with PCP in 1-2 weeks post rehab  Discharge Diagnoses:  Principal Problem:   DKA (diabetic ketoacidoses) Active Problems:   HYPOTHYROIDISM   ANEMIA-IRON DEFICIENCY   Frequent UTI   HTN (hypertension)   History of CVA (cerebrovascular accident)   Dehydration   Acute renal failure   Moderate aortic stenosis   Chronic diastolic CHF (congestive heart failure)   Dementia   Discharge Condition: stable   Diet recommendation:   Filed Weights   12/31/13 1927 12/31/13 2159 01/03/14 1748  Weight: 83.008 kg (183 lb) 86.7 kg (191 lb 2.2 oz) 89.54 kg (197 lb 6.4 oz)    History of present illness:  73 year old female patient resident of skilled nursing facility. Sent from the nursing home because of altered mentation related to increasing lethargy. According to the patient's family patient with recent right humerus fracture and was to follow with Dr. Percell Miller as an outpatient. Because of significant discomfort she was placed on pain medications and placed in a sling. Over 2-3 days patient began having increasing thirst but no other symptoms reported. She was lethargic on the morning of presentation.  Upon arrival to the ER laboratory data demonstrated patient and significant DKA. She was also hypotensive and required 4 L of normal saline upon arrival. Urinalysis was concerning for UTI. According to the family the patient has recurrent urinary tract infections with similar presentations.   Hospital Course:  1. DKA (diabetic ketoacidoses) HA1C-9.9  -resolved on IVF, insulin; cont insulin regimen SQ, increased lantus to 40 bid  2. Metabolic encephalopathy/ Dementia  -at baseline; SLP eval D3 diet  3. Klebsiella UTI/history vulvar cancer/recurrent UTUI   -Family endorses frequent recurrence of urinary tract infection  -blood cx NGTD, afebrile; no leukocytosis; complete IV ceftriaxone  4. Acute renal failure resolved on IVF;  5. Hypokalemia replete 6. ANEMIA-IRON DEFICIENCY On iron supplementation prior to admission  7. HTN (hypertension) stable  8. Moderate aortic stenosis Followed by Dr. Debara Pickett as an outpatient; no s/sof acute CHF  9. History of CVA (cerebrovascular accident)  -On Aggrenox  10. Chronic diastolic CHF (congestive heart failure), clinically stable     Procedures:  nonen (i.e. Studies not automatically included, echos, thoracentesis, etc; not x-rays)  Consultations:  None   Discharge Exam: Filed Vitals:   01/05/14 0947  BP: 135/85  Pulse: 95  Temp: 97.8 F (36.6 C)  Resp: 16    General: alert Cardiovascular: s1,s2 rrr Respiratory: CTA BL  Discharge Instructions  Discharge Orders   Future Orders Complete By Expires   Diet - low sodium heart healthy  As directed    Discharge instructions  As directed    Comments:     Please follow up with primary care doctor in 1-2 week s   Increase activity slowly  As directed        Medication List    STOP taking these medications       buPROPion 150 MG 12 hr tablet  Commonly known as:  WELLBUTRIN SR     HYDROcodone-acetaminophen 5-325 MG per tablet  Commonly known as:  NORCO/VICODIN      TAKE these medications       amLODipine 5 MG tablet  Commonly known as:  NORVASC  Take 1 tablet (5 mg total) by mouth daily.  atorvastatin 20 MG tablet  Commonly known as:  LIPITOR  Take 20 mg by mouth at bedtime.     calcium-vitamin D 500-200 MG-UNIT per tablet  Commonly known as:  OSCAL WITH D  Take 1 tablet by mouth 2 (two) times daily.     carvedilol 25 MG tablet  Commonly known as:  COREG  Take 1 tablet (25 mg total) by mouth 2 (two) times daily with a meal.     dipyridamole-aspirin 200-25 MG per 12 hr capsule  Commonly known as:  AGGRENOX  Take 1  capsule by mouth 2 (two) times daily.     DULoxetine 60 MG capsule  Commonly known as:  CYMBALTA  Take 60 mg by mouth daily.     ezetimibe 10 MG tablet  Commonly known as:  ZETIA  Take 10 mg by mouth at bedtime.     feeding supplement (ENSURE) Pudg  Take 1 Container by mouth 2 (two) times daily as needed (Please offer if meal completion is less than 75%).     ferrous fumarate 325 (106 FE) MG Tabs tablet  Commonly known as:  HEMOCYTE - 106 mg FE  Take 2 tablets by mouth daily.     Fish Oil 1000 MG Caps  Take 2,000 mg by mouth daily.     insulin aspart 100 UNIT/ML injection  Commonly known as:  novoLOG  Inject 0-5 Units into the skin 3 (three) times daily before meals. Per sliding scale : (0-200 bs = 0 units) ([phone] bs = 5 units)     insulin detemir 100 UNIT/ML injection  Commonly known as:  LEVEMIR  Inject 0.4 mLs (40 Units total) into the skin 2 (two) times daily.     levothyroxine 75 MCG tablet  Commonly known as:  SYNTHROID, LEVOTHROID  Take 1 tablet (75 mcg total) by mouth daily before breakfast.     multivitamin with minerals Tabs tablet  Take 1 tablet by mouth daily.     NAMENDA XR 7 MG Cp24  Generic drug:  Memantine HCl ER  Take 7 mg by mouth at bedtime.     potassium chloride SA 20 MEQ tablet  Commonly known as:  K-DUR,KLOR-CON  Take 1 tablet (20 mEq total) by mouth daily.     saccharomyces boulardii 250 MG capsule  Commonly known as:  FLORASTOR  Take 500 mg by mouth daily.     Travoprost (BAK Free) 0.004 % Soln ophthalmic solution  Commonly known as:  TRAVATAN  Place 1 drop into both eyes at bedtime.       Allergies  Allergen Reactions  . Amoxicillin     REACTION: rash  . Ciprofloxacin     REACTION: hives  . Codeine     REACTION: nervous, shaky  . Duloxetine     REACTION: reaction not known  . Hydrochlorothiazide W-Triamterene     REACTION: hyponatremia  . Ticlopidine Hcl     REACTION: bleeding       Follow-up Information   Follow up  with [facility], NP In 1 week.   Specialty:  Nurse Practitioner   Contact information:   [place] [place] [address] [phone]        The results of significant diagnostics from this hospitalization (including imaging, microbiology, ancillary and laboratory) are listed below for reference.    Significant Diagnostic Studies: Dg Chest 1 View  12/20/2013   CLINICAL DATA Fall.  Right humeral fracture  EXAM CHEST - 1 VIEW  COMPARISON 11/26/2013  FINDINGS  Right humeral neck fracture  Cardiac enlargement without heart failure. Mild bibasilar atelectasis. No effusion  IMPRESSION Mild bibasilar atelectasis.  SIGNATURE  Electronically Signed   By: Franchot Gallo M.D.   On: 12/20/2013 16:17   Dg Shoulder Right  12/20/2013   CLINICAL DATA Fall  EXAM RIGHT SHOULDER - 2+ VIEW  COMPARISON None.  FINDINGS Fracture of the humeral neck with mild displacement.  No dislocation  Degenerative change and spurring in the [facility] joint.  IMPRESSION Fracture of the right humeral neck.  SIGNATURE  Electronically Signed   By: Franchot Gallo M.D.   On: 12/20/2013 16:13   Dg Elbow Complete Right  12/20/2013   CLINICAL DATA Fall  EXAM RIGHT ELBOW - COMPLETE 3+ VIEW  COMPARISON none  FINDINGS Limited views due to humeral neck fracture. Lateral view was not obtained. No fracture seen on the AP and oblique view. There is soft tissue calcification medial and lateral to the distal humerus due to chronic tendinitis.  IMPRESSION Limited study.  No fracture identified  SIGNATURE  Electronically Signed   By: Franchot Gallo M.D.   On: 12/20/2013 16:15   Dg Forearm Right  12/20/2013   CLINICAL DATA Fall.  EXAM RIGHT FOREARM - 2 VIEW  COMPARISON None.  FINDINGS Negative for fracture.  Degenerative change in the wrist joint.  IMPRESSION Negative for fracture.  SIGNATURE  Electronically Signed   By: Franchot Gallo M.D.   On: 12/20/2013 16:16   Ct Head Wo Contrast  12/31/2013   CLINICAL DATA:  Altered mental status   EXAM: CT HEAD WITHOUT CONTRAST  TECHNIQUE: Contiguous axial images were obtained from the base of the skull through the vertex without intravenous contrast.  COMPARISON:  CT HEAD W/O CM dated 11/26/2013  FINDINGS: There is no evidence of mass effect, midline shift, or extra-axial fluid collections. There is no evidence of a space-occupying lesion or intracranial hemorrhage. There is no evidence of a cortical-based area of acute infarction. There is generalized cerebral atrophy. There is periventricular white matter low attenuation likely secondary to microangiopathy.  The ventricles and sulci are appropriate for the patient's age. The basal cisterns are patent.  Visualized portions of the orbits are unremarkable. The visualized portions of the paranasal sinuses and mastoid air cells are unremarkable. Cerebrovascular atherosclerotic calcifications are noted.  The osseous structures are unremarkable.  IMPRESSION: No acute intracranial pathology.   Electronically Signed   By: Kathreen Devoid   On: 12/31/2013 18:03   Dg Chest Port 1 View  12/31/2013   CLINICAL DATA:  Central line placement.  Diabetic ketoacidosis.  EXAM: PORTABLE CHEST - 1 VIEW  COMPARISON:  12/20/2013  FINDINGS: A new right subclavian central venous catheter is seen with tip in the mid right atrium. No evidence of pneumothorax.  Cardiomegaly is stable.  Both lungs are clear.  IMPRESSION: New right subclavian central venous catheter tip in the mid right atrium. No evidence of pneumothorax.  Stable cardiomegaly.  No active lung disease.   Electronically Signed   By: Earle Gell M.D.   On: 12/31/2013 20:11   Dg Humerus Right  12/20/2013   CLINICAL DATA Fall  EXAM RIGHT HUMERUS - 2+ VIEW  COMPARISON None.  FINDINGS Fracture of the humeral neck. Greater tuberosity also fractured. Mild displacement.  Degenerative change in the [facility] joint.  No additional fractures are identified.  IMPRESSION Fracture of the right humeral neck.  SIGNATURE  Electronically  Signed   By: Franchot Gallo M.D.   On: 12/20/2013  16:13    Microbiology: Recent Results (from the past 240 hour(s))  URINE CULTURE     Status: None   Collection Time    12/31/13  6:25 PM      Result Value Ref Range Status   Specimen Description URINE, CATHETERIZED   Final   Special Requests NONE   Final   Culture  Setup Time     Final   Value: 01/01/2014 02:18     Two isolates with different morphologies were identified as the same organism.The most resistant organism was reported.     Performed at [facility]     Final   Value: >=100,000 COLONIES/ML     Performed at [facility]   Culture     Final   Value: KLEBSIELLA PNEUMONIAE     Performed at [facility]   Report Status 01/03/2014 FINAL   Final   Organism ID, Bacteria KLEBSIELLA PNEUMONIAE   Final  MRSA PCR SCREENING     Status: Abnormal   Collection Time    12/31/13 10:05 PM      Result Value Ref Range Status   MRSA by PCR POSITIVE (*) NEGATIVE Final   Comment:            The GeneXpert MRSA Assay (FDA     approved for NASAL specimens     only), is one component of a     comprehensive MRSA colonization     surveillance program. It is not     intended to diagnose MRSA     infection nor to guide or     monitor treatment for     MRSA infections.     RESULT CALLED TO, READ BACK BY AND VERIFIED WITH:     BROWN,R RN 0004 01/01/14 MITCHELL,L  CULTURE, BLOOD (ROUTINE X 2)     Status: None   Collection Time    01/01/14  1:25 PM      Result Value Ref Range Status   Specimen Description BLOOD RIGHT HAND   Final   Special Requests BOTTLES DRAWN AEROBIC ONLY 10CC   Final   Culture  Setup Time     Final   Value: 01/01/2014 18:48     Performed at [facility]   Culture     Final   Value:        BLOOD CULTURE RECEIVED NO GROWTH TO DATE CULTURE WILL BE HELD FOR 5 DAYS BEFORE ISSUING A FINAL NEGATIVE REPORT     Performed at [facility]   Report Status PENDING   Incomplete   CULTURE, BLOOD (ROUTINE X 2)     Status: None   Collection Time    01/01/14  1:30 PM      Result Value Ref Range Status   Specimen Description BLOOD LEFT HAND   Final   Special Requests BOTTLES DRAWN AEROBIC ONLY [facility]   Final   Culture  Setup Time     Final   Value: 01/01/2014 18:48     Performed at [facility]   Culture     Final   Value:        BLOOD CULTURE RECEIVED NO GROWTH TO DATE CULTURE WILL BE HELD FOR 5 DAYS BEFORE ISSUING A FINAL NEGATIVE REPORT     Performed at [facility]   Report Status PENDING   Incomplete     Labs: Basic Metabolic Panel:  Recent Labs Lab 01/02/14 0830 01/02/14 1300 01/03/14  0440 01/04/14 0620 01/05/14 0520  NA 136* 136* 137 136* 136*  K 3.0* 2.8* 3.3* 3.2* 3.3*  CL 103 103 101 99 95*  CO2 17* 19 22 24 26   GLUCOSE 179* 166* 151* 90 174*  BUN 14 10 5* 3* 4*  CREATININE 0.79 0.70 0.60 0.46* 0.51  CALCIUM 8.1* 8.1* 7.9* 8.0* 8.2*   Liver Function Tests:  Recent Labs Lab 12/31/13 1930  AST 19  ALT 28  ALKPHOS 93  BILITOT 0.4  PROT 5.3*  ALBUMIN 2.4*    Recent Labs Lab 12/31/13 1722  LIPASE 18   No results found for this basename: AMMONIA,  in the last 168 hours CBC:  Recent Labs Lab 12/31/13 1930 12/31/13 2303 01/01/14 0302 01/02/14 0415 01/03/14 0440 01/04/14 0620  WBC 17.5* 20.9* 16.8* 12.5* 8.9 8.2  NEUTROABS 14.5*  --  13.2*  --   --   --   HGB 11.1* 10.9* 10.5* 11.8* 12.7 14.2  HCT 32.8* 31.4* 30.3* 33.2* 35.6* 40.0  MCV 88.9 86.7 85.8 84.7 84.8 84.9  PLT 242 272 254 204 169 154   Cardiac Enzymes:  Recent Labs Lab 12/31/13 1930 12/31/13 2303 01/01/14 0500 01/01/14 0900  TROPONINI <0.30 <0.30 <0.30 <0.30   BNP: BNP (last 3 results) No results found for this basename: PROBNP,  in the last 8760 hours CBG:  Recent Labs Lab 01/04/14 1613 01/04/14 2023 01/05/14 0004 01/05/14 0414 01/05/14 0755  GLUCAP 199* 246* 243* 184* 130*       Signed:  Rowe Clack N  [facility] 01/05/2014, 11:08 AM

## 2014-01-05 NOTE — Clinical Social Work Note (Signed)
Per MD patient ready to DC back to [facility]. RN, patient, daughter, and facility aware of DC. RN given number for report. DC packet on chart. CSW has requested ambulance transport for patient. CSW instructed RN to call patient's daughter [name] when patient is being picked up by EMS. CSW signing off at this time.    Liz Beach, [place], [place], [phone]

## 2014-01-05 NOTE — Progress Notes (Signed)
01/05/14 Patient to be discharged back to [facility] today.

## 2014-01-05 NOTE — Progress Notes (Signed)
Pts BP elevated at 160/100 manually. Schorr, NP notified, 5mg  of hydralazine ordered.

## 2014-01-05 NOTE — Progress Notes (Signed)
Per MD order, central line removed. IV cathter intact. Vaseline pressure gauze to site, pressure held x 5 min, no bleeding to site. Pt instructed not to get out of bed for 30 min after the removal of the central line. Instucted to keep dressing CDI x 24hours, if bleeding occurs hold pressure and contact medical staff.Ma Rings, Arlie Solomons

## 2014-01-07 LAB — CULTURE, BLOOD (ROUTINE X 2)
CULTURE: NO GROWTH
Culture: NO GROWTH

## 2014-03-14 ENCOUNTER — Telehealth (HOSPITAL_COMMUNITY): Payer: Self-pay | Admitting: *Deleted

## 2014-04-09 ENCOUNTER — Encounter: Payer: Self-pay | Admitting: Internal Medicine

## 2014-04-09 ENCOUNTER — Ambulatory Visit (INDEPENDENT_AMBULATORY_CARE_PROVIDER_SITE_OTHER): Payer: Medicare Other | Admitting: Internal Medicine

## 2014-04-09 VITALS — BP 112/64 | HR 61 | Temp 97.6°F | Resp 12 | Wt 187.0 lb

## 2014-04-09 DIAGNOSIS — E1151 Type 2 diabetes mellitus with diabetic peripheral angiopathy without gangrene: Secondary | ICD-10-CM | POA: Insufficient documentation

## 2014-04-09 DIAGNOSIS — IMO0002 Reserved for concepts with insufficient information to code with codable children: Secondary | ICD-10-CM | POA: Insufficient documentation

## 2014-04-09 DIAGNOSIS — E1159 Type 2 diabetes mellitus with other circulatory complications: Secondary | ICD-10-CM

## 2014-04-09 DIAGNOSIS — I798 Other disorders of arteries, arterioles and capillaries in diseases classified elsewhere: Secondary | ICD-10-CM

## 2014-04-09 DIAGNOSIS — E1165 Type 2 diabetes mellitus with hyperglycemia: Principal | ICD-10-CM

## 2014-04-09 MED ORDER — INSULIN ASPART 100 UNIT/ML ~~LOC~~ SOLN: 5.0000 [IU] | Freq: Three times a day (TID) | SUBCUTANEOUS | Status: DC

## 2014-04-09 MED ORDER — INSULIN DETEMIR 100 UNIT/ML ~~LOC~~ SOLN: 30.0000 [IU] | Freq: Two times a day (BID) | SUBCUTANEOUS | Status: DC

## 2014-04-09 NOTE — Patient Instructions (Signed)
Please decrease Levemir to:30 units 2x a day. Please give mealtime NovoLog (at the start of the meal, 3x a day): 5 units. Please hold if pt not eating >50% of the meal or if sugars <70. Continue Sliding scale NovoLog: - 150-175: + 1 unit  - 176-200: + 2 units  - 201-225: + 3 units  - 226-250: + 4 units  - 251-275: + 5 units - 276-300: + 6 units  Please return in 1 month with your sugar log.

## 2014-04-09 NOTE — Progress Notes (Signed)
Patient ID: Regina [name], female   DOB: 04-Jan-1941, 73 y.o.   MRN: [id number]  HPI: Regina [name] is a 73 y.o.-year-old female, returning for f/u for DM2, dx 1994, insulin-dependent since 1999, uncontrolled, with complications (multiple TIAs, DR). She was very independent before the TIAs. Now at [facility]. PCP: Dr Deborra Medina. She is here with her daughter, who offers all of the history, as the patient does not speak. Last visit 8 mo ago  She had several DKA episodes in the past. She had several hospitalizations since last visit: for hyperglycemia, also, she broke her R arm (12/2013). She now has a UTI.  At the facility, she is dropping her sugars at night >> 30s! She is given glucagon in these situations.   Last hemoglobin A1c was: 03/10/2014: 9.2% Lab Results  Component Value Date   HGBA1C 9.9* 12/31/2013   HGBA1C 9.6* 11/27/2013   HGBA1C 9.6* 06/07/2013   Pt is on a regimen of: - Levemir 40 >> 30 >> 48 units 2x a day - Novolog 5 units tid ac - she is not SSI, target 150,  ISF 50.  Pt has her sugars checked 3 a day and they are: - am: 60-200 >> 30s-180s - In the rest of the day, sugars can go up to 300s up to 500s Lowest sugar was 30s; she has hypoglycemia awareness at 70. Highest sugar was 500.  - no CKD, with kidney function returning to normal after her episode of acute kidney injury during her last hospitalization. Last BUN/creatinine:  Lab Results  Component Value Date   BUN 4* 01/05/2014   CREATININE 0.51 01/05/2014  Off ARB since last admission 2/2 ARF.  - last set of lipids: Lab Results  Component Value Date   CHOL 190 07/22/2013   HDL 28* 07/22/2013   LDLCALC 130* 07/22/2013   TRIG 160* 07/22/2013   CHOLHDL 6.8 07/22/2013  She is on ezetimibe, Zocor. - last eye exam was in 08/2013. + DR in left eye.  - no numbness and tingling in her feet. Podiatrist: Dr Prudence Davidson.  I reviewed pt's medications, allergies, PMH, social hx, family hx and no changes required, except as  mentioned above.   ROS: Constitutional: + weight loss, + fatigue, + chills, + nocturia, + burning with urination Eyes: + blurry vision, no xerophthalmia ENT: no sore throat, no nodules palpated in throat, no dysphagia/odynophagia, no hoarseness Cardiovascular: no CP/SOB/palpitations/leg swelling Respiratory: no cough/SOB Gastrointestinal: no N/V/D/no C/heartburn Musculoskeletal: no muscle/joint aches Skin: no rashes Neurological: no tremors/numbness/tingling/dizziness, + confusion (dementia)  PE: BP 112/64  Pulse 61  Temp(Src) 97.6 F (36.4 C) (Oral)  Resp 12  Wt 187 lb (84.823 kg)  SpO2 95% Wt Readings from Last 3 Encounters:  04/09/14 187 lb (84.823 kg)  01/03/14 197 lb 6.4 oz (89.54 kg)  11/26/13 192 lb (87.091 kg)   Constitutional: overweight, in NAD, in wheelchair, can stand but only walking few steps, nonverbal, but can answer yes/no when asked Eyes: PERRLA, EOMI, no exophthalmos ENT: moist mucous membranes, no thyromegaly, no cervical lymphadenopathy Cardiovascular: RRR, 2/6 SEM, noRG, no LE edema Respiratory: CTA B Gastrointestinal: abdomen soft, NT, ND, BS+ Musculoskeletal: no deformities, strength intact in all 4 Skin: moist, warm, stasis dermatitis Neurological: no tremor with outstretched hands, DTR normal in all 4  ASSESSMENT: 1. 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 Patient with long-standing, uncontrolled diabetes, on  basal bolus insulin regimen, with a large amount of basal insulin and very few units of bolus NovoLog in a day. Subsequently, her sugars are low at night and in am, and greatly increased throughout the day, with a staircase effect. - We discussed about options for treatment, and I suggested to:  Patient Instructions  Please decrease Levemir to:30 units 2x a day. Please give mealtime NovoLog (at the start of the  meal, 3x a day): 5 units. Please hold if pt not eating >50% of the meal or if sugars <70. Continue Sliding scale NovoLog: - 150-175: + 1 unit  - 176-200: + 2 units  - 201-225: + 3 units  - 226-250: + 4 units  - 251-275: + 5 units - 276-300: + 6 units  Please return in 1 month with your sugar log.   - advised for yearly eye exams - she is up to date - form for SNF filled out - Return to clinic in 1 mo with sugar log

## 2014-05-09 ENCOUNTER — Ambulatory Visit: Payer: Medicare Other | Admitting: Internal Medicine

## 2014-05-09 ENCOUNTER — Encounter (HOSPITAL_COMMUNITY): Payer: Self-pay | Admitting: Emergency Medicine

## 2014-05-09 ENCOUNTER — Emergency Department (HOSPITAL_COMMUNITY)
Admission: EM | Admit: 2014-05-09 | Discharge: 2014-05-09 | Disposition: A | Discharge status: Home / Self Care | Payer: Medicare Other | Attending: Emergency Medicine | Admitting: Emergency Medicine

## 2014-05-09 DIAGNOSIS — Z87891 Personal history of nicotine dependence: Secondary | ICD-10-CM | POA: Insufficient documentation

## 2014-05-09 DIAGNOSIS — M129 Arthropathy, unspecified: Secondary | ICD-10-CM | POA: Insufficient documentation

## 2014-05-09 DIAGNOSIS — F329 Major depressive disorder, single episode, unspecified: Secondary | ICD-10-CM | POA: Insufficient documentation

## 2014-05-09 DIAGNOSIS — R011 Cardiac murmur, unspecified: Secondary | ICD-10-CM | POA: Diagnosis not present

## 2014-05-09 DIAGNOSIS — N390 Urinary tract infection, site not specified: Secondary | ICD-10-CM | POA: Diagnosis not present

## 2014-05-09 DIAGNOSIS — I359 Nonrheumatic aortic valve disorder, unspecified: Secondary | ICD-10-CM | POA: Insufficient documentation

## 2014-05-09 DIAGNOSIS — F3289 Other specified depressive episodes: Secondary | ICD-10-CM | POA: Diagnosis not present

## 2014-05-09 DIAGNOSIS — Z8719 Personal history of other diseases of the digestive system: Secondary | ICD-10-CM | POA: Insufficient documentation

## 2014-05-09 DIAGNOSIS — Z7982 Long term (current) use of aspirin: Secondary | ICD-10-CM | POA: Insufficient documentation

## 2014-05-09 DIAGNOSIS — Z880 Allergy status to penicillin: Secondary | ICD-10-CM | POA: Diagnosis not present

## 2014-05-09 DIAGNOSIS — Z794 Long term (current) use of insulin: Secondary | ICD-10-CM | POA: Insufficient documentation

## 2014-05-09 DIAGNOSIS — I10 Essential (primary) hypertension: Secondary | ICD-10-CM | POA: Insufficient documentation

## 2014-05-09 DIAGNOSIS — E785 Hyperlipidemia, unspecified: Secondary | ICD-10-CM | POA: Insufficient documentation

## 2014-05-09 DIAGNOSIS — Z79899 Other long term (current) drug therapy: Secondary | ICD-10-CM | POA: Insufficient documentation

## 2014-05-09 DIAGNOSIS — Z792 Long term (current) use of antibiotics: Secondary | ICD-10-CM | POA: Diagnosis not present

## 2014-05-09 DIAGNOSIS — Z8673 Personal history of transient ischemic attack (TIA), and cerebral infarction without residual deficits: Secondary | ICD-10-CM | POA: Insufficient documentation

## 2014-05-09 DIAGNOSIS — E039 Hypothyroidism, unspecified: Secondary | ICD-10-CM | POA: Insufficient documentation

## 2014-05-09 DIAGNOSIS — D649 Anemia, unspecified: Secondary | ICD-10-CM | POA: Diagnosis not present

## 2014-05-09 DIAGNOSIS — H543 Unqualified visual loss, both eyes: Secondary | ICD-10-CM | POA: Insufficient documentation

## 2014-05-09 DIAGNOSIS — E1169 Type 2 diabetes mellitus with other specified complication: Secondary | ICD-10-CM | POA: Diagnosis not present

## 2014-05-09 DIAGNOSIS — Z8544 Personal history of malignant neoplasm of other female genital organs: Secondary | ICD-10-CM | POA: Diagnosis not present

## 2014-05-09 DIAGNOSIS — E162 Hypoglycemia, unspecified: Secondary | ICD-10-CM

## 2014-05-09 LAB — BASIC METABOLIC PANEL
Anion gap: 13 (ref 5–15)
BUN: 23 mg/dL (ref 6–23)
CHLORIDE: 103 meq/L (ref 96–112)
CO2: 26 mEq/L (ref 19–32)
Calcium: 9.1 mg/dL (ref 8.4–10.5)
Creatinine, Ser: 0.95 mg/dL (ref 0.50–1.10)
GFR calc non Af Amer: 59 mL/min — ABNORMAL LOW (ref >90)
GFR, EST AFRICAN AMERICAN: 68 mL/min — AB (ref >90)
Glucose, Bld: 79 mg/dL (ref 70–99)
POTASSIUM: 4.5 meq/L (ref 3.7–5.3)
Sodium: 142 mEq/L (ref 137–147)

## 2014-05-09 LAB — CBG MONITORING, ED
GLUCOSE-CAPILLARY: 60 mg/dL — AB (ref 70–99)
Glucose-Capillary: 93 mg/dL (ref 70–99)
Glucose-Capillary: 77 mg/dL (ref 70–99)
Glucose-Capillary: 76 mg/dL (ref 70–99)
Glucose-Capillary: 132 mg/dL — ABNORMAL HIGH (ref 70–99)

## 2014-05-09 LAB — URINE MICROSCOPIC-ADD ON

## 2014-05-09 LAB — URINALYSIS, ROUTINE W REFLEX MICROSCOPIC
BILIRUBIN URINE: NEGATIVE
Glucose, UA: NEGATIVE mg/dL
Ketones, ur: NEGATIVE mg/dL
Nitrite: NEGATIVE
Protein, ur: NEGATIVE mg/dL
SPECIFIC GRAVITY, URINE: 1.013 (ref 1.005–1.030)
UROBILINOGEN UA: 0.2 mg/dL (ref 0.0–1.0)
pH: 6 (ref 5.0–8.0)

## 2014-05-09 LAB — CBC WITH DIFFERENTIAL/PLATELET
BASOS PCT: 0 % (ref 0–1)
Basophils Absolute: 0 10*3/uL (ref 0.0–0.1)
EOS PCT: 0 % (ref 0–5)
Eosinophils Absolute: 0 10*3/uL (ref 0.0–0.7)
HCT: 40.1 % (ref 36.0–46.0)
HEMOGLOBIN: 13.4 g/dL (ref 12.0–15.0)
Lymphocytes Relative: 12 % (ref 12–46)
Lymphs Abs: 1.5 10*3/uL (ref 0.7–4.0)
MCH: 30.5 pg (ref 26.0–34.0)
MCHC: 33.4 g/dL (ref 30.0–36.0)
MCV: 91.3 fL (ref 78.0–100.0)
MONOS PCT: 4 % (ref 3–12)
Monocytes Absolute: 0.5 10*3/uL (ref 0.1–1.0)
NEUTROS PCT: 84 % — AB (ref 43–77)
Neutro Abs: 10.1 10*3/uL — ABNORMAL HIGH (ref 1.7–7.7)
PLATELETS: 136 10*3/uL — AB (ref 150–400)
RBC: 4.39 MIL/uL (ref 3.87–5.11)
RDW: 12.9 % (ref 11.5–15.5)
SMEAR REVIEW: DECREASED
WBC: 12.1 10*3/uL — AB (ref 4.0–10.5)

## 2014-05-09 MED ORDER — CEPHALEXIN 500 MG PO CAPS: 500.0000 mg | ORAL_CAPSULE | Freq: Four times a day (QID) | ORAL | Status: DC

## 2014-05-09 MED ORDER — CEPHALEXIN 250 MG PO CAPS
500.0000 mg | ORAL_CAPSULE | Freq: Once | ORAL | Status: AC
  Administered 2014-05-09: 500 mg via ORAL
  Filled 2014-05-09: qty 2

## 2014-05-09 NOTE — Discharge Instructions (Signed)
Urinary Tract Infection  It appears she have a urinary tract infection. You are currently on Macrobid. This needs to be discontinued based on prior cultures. She will be started on Keflex. Urine culture was sent.  Urinary tract infections (UTIs) can develop anywhere along your urinary tract. Your urinary tract is your body's drainage system for removing wastes and extra water. Your urinary tract includes two kidneys, two ureters, a bladder, and a urethra. Your kidneys are a pair of bean-shaped organs. Each kidney is about the size of your fist. They are located below your ribs, one on each side of your spine. CAUSES Infections are caused by microbes, which are microscopic organisms, including fungi, viruses, and bacteria. These organisms are so small that they can only be seen through a microscope. Bacteria are the microbes that most commonly cause UTIs. SYMPTOMS  Symptoms of UTIs may vary by age and gender of the patient and by the location of the infection. Symptoms in young women typically include a frequent and intense urge to urinate and a painful, burning feeling in the bladder or urethra during urination. Older women and men are more likely to be tired, shaky, and weak and have muscle aches and abdominal pain. A fever may mean the infection is in your kidneys. Other symptoms of a kidney infection include pain in your back or sides below the ribs, nausea, and vomiting. DIAGNOSIS To diagnose a UTI, your caregiver will ask you about your symptoms. Your caregiver also will ask to provide a urine sample. The urine sample will be tested for bacteria and white blood cells. White blood cells are made by your body to help fight infection. TREATMENT  Typically, UTIs can be treated with medication. Because most UTIs are caused by a bacterial infection, they usually can be treated with the use of antibiotics. The choice of antibiotic and length of treatment depend on your symptoms and the type of bacteria  causing your infection. HOME CARE INSTRUCTIONS  If you were prescribed antibiotics, take them exactly as your caregiver instructs you. Finish the medication even if you feel better after you have only taken some of the medication.  Drink enough water and fluids to keep your urine clear or pale yellow.  Avoid caffeine, tea, and carbonated beverages. They tend to irritate your bladder.  Empty your bladder often. Avoid holding urine for long periods of time.  Empty your bladder before and after sexual intercourse.  After a bowel movement, women should cleanse from front to back. Use each tissue only once. SEEK MEDICAL CARE IF:   You have back pain.  You develop a fever.  Your symptoms do not begin to resolve within 3 days. SEEK IMMEDIATE MEDICAL CARE IF:   You have severe back pain or lower abdominal pain.  You develop chills.  You have nausea or vomiting.  You have continued burning or discomfort with urination. MAKE SURE YOU:   Understand these instructions.  Will watch your condition.  Will get help right away if you are not doing well or get worse. Document Released: 07/08/2005 Document Revised: 03/29/2012 Document Reviewed: 11/06/2011 [name] Patient Information 2015 [place], [place]. This information is not intended to replace advice given to you by your health care provider. Make sure you discuss any questions you have with your health care provider. Hypoglycemia  Check glucose prior to every meal.  Hold insulin for blood sugars less than 100. Glucoses need to be monitored closely.  Hypoglycemia occurs when the glucose in your blood is  too low. Glucose is a type of sugar that is your body's main energy source. Hormones, such as insulin and glucagon, control the level of glucose in the blood. Insulin lowers blood glucose and glucagon increases blood glucose. Having too much insulin in your blood stream, or not eating enough food containing sugar, can result in  hypoglycemia. Hypoglycemia can happen to people with or without diabetes. It can develop quickly and can be a medical emergency.  CAUSES   Missing or delaying meals.  Not eating enough carbohydrates at meals.  Taking too much diabetes medicine.  Not timing your oral diabetes medicine or insulin doses with meals, snacks, and exercise.  Nausea and vomiting.  Certain medicines.  Severe illnesses, such as hepatitis, kidney disorders, and certain eating disorders.  Increased activity or exercise without eating something extra or adjusting medicines.  Drinking too much alcohol.  A nerve disorder that affects body functions like your heart rate, blood pressure, and digestion (autonomic neuropathy).  A condition where the stomach muscles do not function properly (gastroparesis). Therefore, medicines and food may not absorb properly.  Rarely, a tumor of the pancreas can produce too much insulin. SYMPTOMS   Hunger.  Sweating (diaphoresis).  Change in body temperature.  Shakiness.  Headache.  Anxiety.  Lightheadedness.  Irritability.  Difficulty concentrating.  Dry mouth.  Tingling or numbness in the hands or feet.  Restless sleep or sleep disturbances.  Altered speech and coordination.  Change in mental status.  Seizures or prolonged convulsions.  Combativeness.  Drowsiness (lethargic).  Weakness.  Increased heart rate or palpitations.  Confusion.  Pale, gray skin color.  Blurred or double vision.  Fainting. DIAGNOSIS  A physical exam and medical history will be performed. Your caregiver may make a diagnosis based on your symptoms. Blood tests and other lab tests may be performed to confirm a diagnosis. Once the diagnosis is made, your caregiver will see if your signs and symptoms go away once your blood glucose is raised.  TREATMENT  Usually, you can easily treat your hypoglycemia when you notice symptoms.  Check your blood glucose. If it is less  than 70 mg/dl, take one of the following:   3-4 glucose tablets.    cup juice.    cup regular soda.   1 cup skim milk.   -1 tube of glucose gel.   5-6 hard candies.   Avoid high-fat drinks or food that may delay a rise in blood glucose levels.  Do not take more than the recommended amount of sugary foods, drinks, gel, or tablets. Doing so will cause your blood glucose to go too high.   Wait 10-15 minutes and recheck your blood glucose. If it is still less than 70 mg/dl or below your target range, repeat treatment.   Eat a snack if it is more than 1 hour until your next meal.  There may be a time when your blood glucose may go so low that you are unable to treat yourself at home when you start to notice symptoms. You may need someone to help you. You may even faint or be unable to swallow. If you cannot treat yourself, someone will need to bring you to the hospital.  [place]  If you have diabetes, follow your diabetes management plan by:  Taking your medicines as directed.  Following your exercise plan.  Following your meal plan. Do not skip meals. Eat on time.  Testing your blood glucose regularly. Check your blood glucose before and after  exercise. If you exercise longer or different than usual, be sure to check blood glucose more frequently.  Wearing your medical alert jewelry that says you have diabetes.  Identify the cause of your hypoglycemia. Then, develop ways to prevent the recurrence of hypoglycemia.  Do not take a hot bath or shower right after an insulin shot.  Always carry treatment with you. Glucose tablets are the easiest to carry.  If you are going to drink alcohol, drink it only with meals.  Tell friends or family members ways to keep you safe during a seizure. This may include removing hard or sharp objects from the area or turning you on your side.  Maintain a healthy weight. SEEK MEDICAL CARE IF:   You are having problems  keeping your blood glucose in your target range.  You are having frequent episodes of hypoglycemia.  You feel you might be having side effects from your medicines.  You are not sure why your blood glucose is dropping so low.  You notice a change in vision or a new problem with your vision. SEEK IMMEDIATE MEDICAL CARE IF:   Confusion develops.  A change in mental status occurs.  The inability to swallow develops.  Fainting occurs. Document Released: 09/28/2005 Document Revised: 10/03/2013 Document Reviewed: 01/25/2012 [facility] Patient Information 2015 [place], [place]. This information is not intended to replace advice given to you by your health care provider. Make sure you discuss any questions you have with your health care provider.

## 2014-05-09 NOTE — ED Notes (Addendum)
Pt arrives from [facility], woke with CBG 28. 2 MG of glucagon IM given by nurses at facility. CBG in EMS most recently 86. Last EMS VS 148/88, 70 HR NSR. Pt alert and oriented x4, hx of dementia. DNR golden ticket at bedside Pt family at bedside, states she's been really confused since Wednesday, states she was being treated for possible UTI

## 2014-05-09 NOTE — ED Provider Notes (Signed)
CSN: [id number]     Arrival date & time 05/09/14  2542 History   First MD Initiated Contact with Patient 05/09/14 [id number]     Chief Complaint  Patient presents with  . Hypoglycemia     (Consider location/radiation/quality/duration/timing/severity/associated sxs/prior Treatment) HPI  This is a 73 year old female with a history of hypertension, hyperlipidemia, TIA, and diabetes on insulin who presents with hyperglycemia. The daughter is at the bedside and provides most of the history. Per the daughter, patient was noted to have a blood sugar of 29 this morning. She got 2 doses of glucagon and her sugar did not improve. The daughter states that she was given both her Levemir and NovoLog last night after dinner for blood glucose in the 350s. She's not had anything eat since then. She's had several incidents over the last year of hypoglycemic episodes. Patient is not complaining of anything at this time. Daughter does state that she is currently on Macrobid for UTI. History of chronic UTIs. They deny any fever, chest pain, shortness of breath, abdominal pain, nausea, vomiting.  Patient is DO NOT RESUSCITATE.  Past Medical History  Diagnosis Date  . Hypertension   . Hyperlipidemia   . TIA (transient ischemic attack) 1990's    "mini strokes" (06/07/2013)  . Legally blind     "both eyes; some vision in the right' (06/07/2013)  . Heart murmur 1960's  . Hypothyroidism   . Type II diabetes mellitus   . Anemia   . GERD (gastroesophageal reflux disease)   . Arthritis     "left leg" (06/07/2013)  . Depression   . Vulva cancer   . Altered mental status     "the first time I noticed any problem was 2 d ago" (06/07/2013)  . Aortic stenosis     mod-severe   Past Surgical History  Procedure Laterality Date  . Femur fracture surgery Left 1999  . Cesarean section  1968  . Tubal ligation  1970's  . Vulva surgery  1990's    "cut a big chunk out for cancer" (06/07/2013)  . Transthoracic echocardiogram   02/2013    EF 55-60%, mod conc hypertrophy, grade 1 diastolic dysfunction; AV with mod stenosis; calcified MV annulus & mild MR; LA severely dilated; PA peak pressure 89mmHg   Family History  Problem Relation Age of Onset  . Hypertension Mother   . Hyperlipidemia Mother   . Hyperlipidemia Father   . Hypertension Father   . Diabetes Father    History  Substance Use Topics  . Smoking status: Former Smoker -- 4.00 packs/day for 30 years    Types: Cigarettes    Quit date: 09/14/1987  . Smokeless tobacco: Never Used     Comment: 06/07/2013 "hasn't smoked since her stroke in the 1990's"  . Alcohol Use: No   OB History   Grav Para Term Preterm Abortions TAB SAB Ect Mult Living                 Review of Systems  Constitutional: Negative for fever.  Respiratory: Negative for chest tightness and shortness of breath.   Cardiovascular: Negative for chest pain.  Gastrointestinal: Negative for nausea, vomiting and abdominal pain.  Genitourinary: Negative for dysuria.  Psychiatric/Behavioral: Negative for confusion.  All other systems reviewed and are negative.     Allergies  Ciprofloxacin; Codeine; Hydrochlorothiazide w-triamterene; Metformin and related; Ticlopidine hcl; and Amoxicillin  Home Medications   Prior to Admission medications   Medication Sig Start Date End Date Taking? Authorizing  Provider  amLODipine (NORVASC) 5 MG tablet Take 1 tablet (5 mg total) by mouth daily. 11/29/13  Yes Geradine Girt, DO  atorvastatin (LIPITOR) 20 MG tablet Take 20 mg by mouth at bedtime.   Yes Historical Provider, MD  calcium-vitamin D (OSCAL WITH D) 500-200 MG-UNIT per tablet Take 1 tablet by mouth 2 (two) times daily.   Yes Historical Provider, MD  carvedilol (COREG) 25 MG tablet Take 1 tablet (25 mg total) by mouth 2 (two) times daily with a meal. 10/12/13  Yes Allie Bossier, MD  clarithromycin (BIAXIN) 500 MG tablet Take 500 mg by mouth 2 (two) times daily. 14 day treatment for UTI, starting  05/07/14   Yes Historical Provider, MD  CRANBERRY PO Take 400 mg by mouth 2 (two) times daily.   Yes Historical Provider, MD  dipyridamole-aspirin (AGGRENOX) 200-25 MG per 12 hr capsule Take 1 capsule by mouth 2 (two) times daily. 06/09/13  Yes Shanker Kristeen Mans, MD  DULoxetine (CYMBALTA) 60 MG capsule Take 60 mg by mouth daily.   Yes Historical Provider, MD  ezetimibe (ZETIA) 10 MG tablet Take 10 mg by mouth at bedtime.    Yes Historical Provider, MD  ferrous fumarate (HEMOCYTE - 106 MG FE) 325 (106 FE) MG TABS tablet Take 2 tablets by mouth daily.    Yes Historical Provider, MD  insulin aspart (NOVOLOG) 100 UNIT/ML injection Inject 5-8 Units into the skin 3 (three) times daily before meals. 04/09/14  Yes Philemon Kingdom, MD  insulin detemir (LEVEMIR) 100 UNIT/ML injection Inject 0.3 mLs (30 Units total) into the skin 2 (two) times daily. 04/09/14  Yes Philemon Kingdom, MD  latanoprost (XALATAN) 0.005 % ophthalmic solution Place 1 drop into both eyes at bedtime.   Yes Historical Provider, MD  levothyroxine (SYNTHROID, LEVOTHROID) 75 MCG tablet Take 1 tablet (75 mcg total) by mouth daily before breakfast. 06/07/13  Yes Nishant Dhungel, MD  Memantine HCl ER (NAMENDA XR) 7 MG CP24 Take 7 mg by mouth at bedtime.    Yes Historical Provider, MD  Multiple Vitamin (MULTIVITAMIN WITH MINERALS) TABS tablet Take 1 tablet by mouth daily.   Yes Historical Provider, MD  Omega-3 Fatty Acids (FISH OIL) 1000 MG CAPS Take 2,000 mg by mouth daily.   Yes Historical Provider, MD  potassium chloride SA (K-DUR,KLOR-CON) 20 MEQ tablet Take 20 mEq by mouth 2 (two) times daily.   Yes Historical Provider, MD  saccharomyces boulardii (FLORASTOR) 250 MG capsule Take 500 mg by mouth daily.   Yes Historical Provider, MD  cephALEXin (KEFLEX) 500 MG capsule Take 1 capsule (500 mg total) by mouth 4 (four) times daily. 05/09/14   Merryl Hacker, MD  feeding supplement, ENSURE, (ENSURE) PUDG Take 1 Container by mouth 2 (two) times daily  as needed (Please offer if meal completion is less than 75%). 11/29/13   Geradine Girt, DO  potassium chloride SA (K-DUR,KLOR-CON) 20 MEQ tablet Take 1 tablet (20 mEq total) by mouth daily. 01/05/14 01/08/14  Kinnie Feil, MD   BP 126/58  Pulse 52  Temp(Src) 97.9 F (36.6 C) (Temporal)  Resp 18  Ht 5\' 2"  (1.575 m)  Wt 185 lb (83.915 kg)  BMI 33.83 kg/m2  SpO2 99% Physical Exam  Nursing note and vitals reviewed. Constitutional:  Elderly, no acute distress  HENT:  Head: Normocephalic and atraumatic.  Eyes: Pupils are equal, round, and reactive to light.  Cardiovascular: Normal rate and regular rhythm.   Murmur heard. Pulmonary/Chest: Effort normal and breath  sounds normal. No respiratory distress. She has no wheezes.  Abdominal: Soft. Bowel sounds are normal. There is no tenderness. There is no rebound.  Neurological: She is alert.  Skin: Skin is warm and dry.  Psychiatric: She has a normal mood and affect.    ED Course  Procedures (including critical care time) Labs Review Labs Reviewed  CBC WITH DIFFERENTIAL - Abnormal; Notable for the following:    WBC 12.1 (*)    Platelets 136 (*)    Neutrophils Relative % 84 (*)    Neutro Abs 10.1 (*)    All other components within normal limits  BASIC METABOLIC PANEL - Abnormal; Notable for the following:    GFR calc non Af Amer 59 (*)    GFR calc Af Amer 68 (*)    All other components within normal limits  URINALYSIS, ROUTINE W REFLEX MICROSCOPIC - Abnormal; Notable for the following:    APPearance HAZY (*)    Hgb urine dipstick SMALL (*)    Leukocytes, UA LARGE (*)    All other components within normal limits  URINE MICROSCOPIC-ADD ON - Abnormal; Notable for the following:    Bacteria, UA FEW (*)    All other components within normal limits  CBG MONITORING, ED - Abnormal; Notable for the following:    Glucose-Capillary 60 (*)    All other components within normal limits  CBG MONITORING, ED - Abnormal; Notable for the  following:    Glucose-Capillary 132 (*)    All other components within normal limits  URINE CULTURE  CBG MONITORING, ED  CBG MONITORING, ED  CBG MONITORING, ED    Imaging Review No results found.   EKG Interpretation None      MDM   Final diagnoses:  Hypoglycemia  UTI (lower urinary tract infection)    Patient presents with hypoglycemia.  Daughter reports multiple episodes of hypoglycemia recently.  Patient given long and shortacting insulin after dinner last night.  ALso with chronic UTI.  Patient given breakfast.  Glucoses remained within 70-90 then dropped to 60 prior to lunch.  Patient given lunch and repeat glucose 132.  Urinalysis with evidence of UTI.  CUrrently on macrobid.  However, last culture with poor sensitivity to macrobid.  Patient given Keflex and tolerated well.  Will d/c to living facility.  Strict instructions to monitor BG before and after meals.  After history, exam, and medical workup I feel the patient has been appropriately medically screened and is safe for discharge home. Pertinent diagnoses were discussed with the patient. Patient was given return precautions.    Merryl Hacker, MD 05/09/14 [phone]

## 2014-05-10 LAB — URINE CULTURE
Colony Count: NO GROWTH
Culture: NO GROWTH

## 2014-05-22 ENCOUNTER — Telehealth: Payer: Self-pay | Admitting: Internal Medicine

## 2014-05-22 NOTE — Telephone Encounter (Signed)
Daughter has concerns regarding pt's blood sugars dropping

## 2014-05-22 NOTE — Telephone Encounter (Signed)
Returned call to pt's daughter. She states that her mom's blood sugar is dropping between 4 and 6 am. She is in an assisted living place, [facility] on [address]. They are checking her sugars throughout the night and they are dropping as low as 49. You may need to fax orders to the facility. Please advise.

## 2014-05-22 NOTE — Telephone Encounter (Signed)
We did decrease the insulin at last visit by quite a bit, but she needs even less:  Please decrease Levemir from 30 units 2x a day to 40 units in am  Continue to give mealtime NovoLog (at the start of the meal, 3x a day): 5 units.  Please hold if pt not eating >50% of the meal or if sugars <70.  Continue Sliding scale NovoLog, but do not add more than +4 units - see below:  - 150-175: + 1 unit  - 176-200: + 2 units  - 201-225: + 3 units  - >224: + 4 units   Please call in few days with sugars.

## 2014-05-23 ENCOUNTER — Encounter: Payer: Self-pay | Admitting: *Deleted

## 2014-05-23 NOTE — Telephone Encounter (Signed)
Called pt's daughter and advised her that Dr Cruzita Lederer is decreasing her dosage of her insulin. She gave me the fax number for [facility] [phone]) so that the dosage change can be faxed to the facility. Letter created and faxed to the facility.

## 2014-08-09 ENCOUNTER — Encounter (HOSPITAL_COMMUNITY): Payer: Self-pay | Admitting: Emergency Medicine

## 2014-08-09 ENCOUNTER — Inpatient Hospital Stay (HOSPITAL_COMMUNITY)
Admission: EM | Admit: 2014-08-09 | Discharge: 2014-08-13 | DRG: 638 | Disposition: A | Discharge status: Skilled Nursing Facility | Payer: Medicare Other | Attending: Internal Medicine | Admitting: Internal Medicine

## 2014-08-09 DIAGNOSIS — H548 Legal blindness, as defined in USA: Secondary | ICD-10-CM | POA: Diagnosis present

## 2014-08-09 DIAGNOSIS — E7800 Pure hypercholesterolemia, unspecified: Secondary | ICD-10-CM | POA: Diagnosis present

## 2014-08-09 DIAGNOSIS — Z888 Allergy status to other drugs, medicaments and biological substances status: Secondary | ICD-10-CM | POA: Diagnosis not present

## 2014-08-09 DIAGNOSIS — R5381 Other malaise: Secondary | ICD-10-CM

## 2014-08-09 DIAGNOSIS — F329 Major depressive disorder, single episode, unspecified: Secondary | ICD-10-CM | POA: Diagnosis present

## 2014-08-09 DIAGNOSIS — N390 Urinary tract infection, site not specified: Secondary | ICD-10-CM | POA: Diagnosis present

## 2014-08-09 DIAGNOSIS — Z8673 Personal history of transient ischemic attack (TIA), and cerebral infarction without residual deficits: Secondary | ICD-10-CM | POA: Diagnosis not present

## 2014-08-09 DIAGNOSIS — E11319 Type 2 diabetes mellitus with unspecified diabetic retinopathy without macular edema: Secondary | ICD-10-CM | POA: Diagnosis present

## 2014-08-09 DIAGNOSIS — Z8544 Personal history of malignant neoplasm of other female genital organs: Secondary | ICD-10-CM | POA: Diagnosis not present

## 2014-08-09 DIAGNOSIS — Z794 Long term (current) use of insulin: Secondary | ICD-10-CM | POA: Diagnosis not present

## 2014-08-09 DIAGNOSIS — E11649 Type 2 diabetes mellitus with hypoglycemia without coma: Secondary | ICD-10-CM | POA: Diagnosis present

## 2014-08-09 DIAGNOSIS — B9620 Unspecified Escherichia coli [E. coli] as the cause of diseases classified elsewhere: Secondary | ICD-10-CM | POA: Diagnosis present

## 2014-08-09 DIAGNOSIS — E1310 Other specified diabetes mellitus with ketoacidosis without coma: Secondary | ICD-10-CM | POA: Diagnosis present

## 2014-08-09 DIAGNOSIS — M6281 Muscle weakness (generalized): Secondary | ICD-10-CM | POA: Diagnosis present

## 2014-08-09 DIAGNOSIS — Z8249 Family history of ischemic heart disease and other diseases of the circulatory system: Secondary | ICD-10-CM | POA: Diagnosis not present

## 2014-08-09 DIAGNOSIS — I350 Nonrheumatic aortic (valve) stenosis: Secondary | ICD-10-CM | POA: Diagnosis present

## 2014-08-09 DIAGNOSIS — Z881 Allergy status to other antibiotic agents status: Secondary | ICD-10-CM

## 2014-08-09 DIAGNOSIS — IMO0002 Reserved for concepts with insufficient information to code with codable children: Secondary | ICD-10-CM

## 2014-08-09 DIAGNOSIS — Z87891 Personal history of nicotine dependence: Secondary | ICD-10-CM

## 2014-08-09 DIAGNOSIS — Z833 Family history of diabetes mellitus: Secondary | ICD-10-CM | POA: Diagnosis not present

## 2014-08-09 DIAGNOSIS — E1165 Type 2 diabetes mellitus with hyperglycemia: Secondary | ICD-10-CM

## 2014-08-09 DIAGNOSIS — I10 Essential (primary) hypertension: Secondary | ICD-10-CM | POA: Diagnosis present

## 2014-08-09 DIAGNOSIS — E1169 Type 2 diabetes mellitus with other specified complication: Secondary | ICD-10-CM | POA: Diagnosis present

## 2014-08-09 DIAGNOSIS — D649 Anemia, unspecified: Secondary | ICD-10-CM | POA: Diagnosis present

## 2014-08-09 DIAGNOSIS — F0390 Unspecified dementia without behavioral disturbance: Secondary | ICD-10-CM | POA: Diagnosis present

## 2014-08-09 DIAGNOSIS — E039 Hypothyroidism, unspecified: Secondary | ICD-10-CM | POA: Diagnosis present

## 2014-08-09 DIAGNOSIS — Z885 Allergy status to narcotic agent status: Secondary | ICD-10-CM

## 2014-08-09 DIAGNOSIS — Z7902 Long term (current) use of antithrombotics/antiplatelets: Secondary | ICD-10-CM | POA: Diagnosis not present

## 2014-08-09 DIAGNOSIS — I5032 Chronic diastolic (congestive) heart failure: Secondary | ICD-10-CM | POA: Diagnosis present

## 2014-08-09 DIAGNOSIS — E1110 Type 2 diabetes mellitus with ketoacidosis without coma: Secondary | ICD-10-CM | POA: Diagnosis present

## 2014-08-09 DIAGNOSIS — E1151 Type 2 diabetes mellitus with diabetic peripheral angiopathy without gangrene: Secondary | ICD-10-CM

## 2014-08-09 DIAGNOSIS — N3000 Acute cystitis without hematuria: Secondary | ICD-10-CM

## 2014-08-09 LAB — CBC
HCT: 39.8 % (ref 36.0–46.0)
HCT: 35.6 % — ABNORMAL LOW (ref 36.0–46.0)
HEMOGLOBIN: 13 g/dL (ref 12.0–15.0)
Hemoglobin: 12 g/dL (ref 12.0–15.0)
MCH: 30.2 pg (ref 26.0–34.0)
MCH: 31 pg (ref 26.0–34.0)
MCHC: 32.7 g/dL (ref 30.0–36.0)
MCHC: 33.7 g/dL (ref 30.0–36.0)
MCV: 92.3 fL (ref 78.0–100.0)
MCV: 92 fL (ref 78.0–100.0)
PLATELETS: 136 10*3/uL — AB (ref 150–400)
Platelets: 139 10*3/uL — ABNORMAL LOW (ref 150–400)
RBC: 4.31 MIL/uL (ref 3.87–5.11)
RBC: 3.87 MIL/uL (ref 3.87–5.11)
RDW: 13.3 % (ref 11.5–15.5)
RDW: 13.4 % (ref 11.5–15.5)
WBC: 6.6 10*3/uL (ref 4.0–10.5)
WBC: 5.2 10*3/uL (ref 4.0–10.5)

## 2014-08-09 LAB — BASIC METABOLIC PANEL
ANION GAP: 11 (ref 5–15)
Anion gap: 14 (ref 5–15)
Anion gap: 12 (ref 5–15)
Anion gap: 14 (ref 5–15)
Anion gap: 12 (ref 5–15)
Anion gap: 12 (ref 5–15)
Anion gap: 11 (ref 5–15)
BUN: 19 mg/dL (ref 6–23)
BUN: 18 mg/dL (ref 6–23)
BUN: 18 mg/dL (ref 6–23)
BUN: 18 mg/dL (ref 6–23)
BUN: 17 mg/dL (ref 6–23)
BUN: 15 mg/dL (ref 6–23)
BUN: 14 mg/dL (ref 6–23)
CALCIUM: 8.7 mg/dL (ref 8.4–10.5)
CALCIUM: 8.8 mg/dL (ref 8.4–10.5)
CALCIUM: 8.7 mg/dL (ref 8.4–10.5)
CHLORIDE: 104 meq/L (ref 96–112)
CHLORIDE: 103 meq/L (ref 96–112)
CO2: 23 mEq/L (ref 19–32)
CO2: 24 mEq/L (ref 19–32)
CO2: 25 mEq/L (ref 19–32)
CO2: 26 mEq/L (ref 19–32)
CO2: 24 mEq/L (ref 19–32)
CO2: 24 mEq/L (ref 19–32)
CO2: 26 mEq/L (ref 19–32)
CREATININE: 0.81 mg/dL (ref 0.50–1.10)
CREATININE: 0.8 mg/dL (ref 0.50–1.10)
CREATININE: 0.82 mg/dL (ref 0.50–1.10)
CREATININE: 0.8 mg/dL (ref 0.50–1.10)
CREATININE: 0.77 mg/dL (ref 0.50–1.10)
CREATININE: 0.7 mg/dL (ref 0.50–1.10)
CREATININE: 0.69 mg/dL (ref 0.50–1.10)
Calcium: 8.7 mg/dL (ref 8.4–10.5)
Calcium: 8.8 mg/dL (ref 8.4–10.5)
Calcium: 8.6 mg/dL (ref 8.4–10.5)
Calcium: 8.6 mg/dL (ref 8.4–10.5)
Chloride: 103 mEq/L (ref 96–112)
Chloride: 102 mEq/L (ref 96–112)
Chloride: 103 mEq/L (ref 96–112)
Chloride: 105 mEq/L (ref 96–112)
Chloride: 104 mEq/L (ref 96–112)
GFR calc non Af Amer: 72 mL/min — ABNORMAL LOW (ref >90)
GFR, EST AFRICAN AMERICAN: 83 mL/min — AB (ref >90)
GFR, EST AFRICAN AMERICAN: 81 mL/min — AB (ref >90)
GFR, EST AFRICAN AMERICAN: 84 mL/min — AB (ref >90)
GFR, EST AFRICAN AMERICAN: 84 mL/min — AB (ref >90)
GFR, EST NON AFRICAN AMERICAN: 71 mL/min — AB (ref >90)
GFR, EST NON AFRICAN AMERICAN: 70 mL/min — AB (ref >90)
GFR, EST NON AFRICAN AMERICAN: 72 mL/min — AB (ref >90)
GFR, EST NON AFRICAN AMERICAN: 83 mL/min — AB (ref >90)
GFR, EST NON AFRICAN AMERICAN: 85 mL/min — AB (ref >90)
GFR, EST NON AFRICAN AMERICAN: 86 mL/min — AB (ref >90)
GLUCOSE: 320 mg/dL — AB (ref 70–99)
GLUCOSE: 320 mg/dL — AB (ref 70–99)
GLUCOSE: 173 mg/dL — AB (ref 70–99)
GLUCOSE: 122 mg/dL — AB (ref 70–99)
Glucose, Bld: 358 mg/dL — ABNORMAL HIGH (ref 70–99)
Glucose, Bld: 255 mg/dL — ABNORMAL HIGH (ref 70–99)
Glucose, Bld: 134 mg/dL — ABNORMAL HIGH (ref 70–99)
POTASSIUM: 4.1 meq/L (ref 3.7–5.3)
POTASSIUM: 4.3 meq/L (ref 3.7–5.3)
Potassium: 4.1 mEq/L (ref 3.7–5.3)
Potassium: 4 mEq/L (ref 3.7–5.3)
Potassium: 4 mEq/L (ref 3.7–5.3)
Potassium: 4.3 mEq/L (ref 3.7–5.3)
Potassium: 4.4 mEq/L (ref 3.7–5.3)
Sodium: 140 mEq/L (ref 137–147)
Sodium: 140 mEq/L (ref 137–147)
Sodium: 140 mEq/L (ref 137–147)
Sodium: 141 mEq/L (ref 137–147)
Sodium: 141 mEq/L (ref 137–147)
Sodium: 140 mEq/L (ref 137–147)
Sodium: 140 mEq/L (ref 137–147)

## 2014-08-09 LAB — COMPREHENSIVE METABOLIC PANEL
ALK PHOS: 83 U/L (ref 39–117)
ALT: 33 U/L (ref 0–35)
AST: 42 U/L — ABNORMAL HIGH (ref 0–37)
Albumin: 3 g/dL — ABNORMAL LOW (ref 3.5–5.2)
Anion gap: 18 — ABNORMAL HIGH (ref 5–15)
BUN: 21 mg/dL (ref 6–23)
CHLORIDE: 94 meq/L — AB (ref 96–112)
CO2: 22 mEq/L (ref 19–32)
Calcium: 8.9 mg/dL (ref 8.4–10.5)
Creatinine, Ser: 0.99 mg/dL (ref 0.50–1.10)
GFR calc Af Amer: 65 mL/min — ABNORMAL LOW (ref >90)
GFR calc non Af Amer: 56 mL/min — ABNORMAL LOW (ref >90)
GLUCOSE: 608 mg/dL — AB (ref 70–99)
POTASSIUM: 5.1 meq/L (ref 3.7–5.3)
SODIUM: 134 meq/L — AB (ref 137–147)
Total Bilirubin: 0.7 mg/dL (ref 0.3–1.2)
Total Protein: 6.3 g/dL (ref 6.0–8.3)

## 2014-08-09 LAB — URINE MICROSCOPIC-ADD ON

## 2014-08-09 LAB — GLUCOSE, CAPILLARY
GLUCOSE-CAPILLARY: 429 mg/dL — AB (ref 70–99)
GLUCOSE-CAPILLARY: 309 mg/dL — AB (ref 70–99)
GLUCOSE-CAPILLARY: 113 mg/dL — AB (ref 70–99)
Glucose-Capillary: 377 mg/dL — ABNORMAL HIGH (ref 70–99)
Glucose-Capillary: 334 mg/dL — ABNORMAL HIGH (ref 70–99)
Glucose-Capillary: 241 mg/dL — ABNORMAL HIGH (ref 70–99)
Glucose-Capillary: 149 mg/dL — ABNORMAL HIGH (ref 70–99)
Glucose-Capillary: 128 mg/dL — ABNORMAL HIGH (ref 70–99)
Glucose-Capillary: 123 mg/dL — ABNORMAL HIGH (ref 70–99)
Glucose-Capillary: 125 mg/dL — ABNORMAL HIGH (ref 70–99)
Glucose-Capillary: 133 mg/dL — ABNORMAL HIGH (ref 70–99)
Glucose-Capillary: 151 mg/dL — ABNORMAL HIGH (ref 70–99)

## 2014-08-09 LAB — URINALYSIS, ROUTINE W REFLEX MICROSCOPIC
BILIRUBIN URINE: NEGATIVE
Glucose, UA: >1000 mg/dL — AB
KETONES UR: 40 mg/dL — AB
NITRITE: POSITIVE — AB
Protein, ur: NEGATIVE mg/dL
Specific Gravity, Urine: 1.022 (ref 1.005–1.030)
UROBILINOGEN UA: 0.2 mg/dL (ref 0.0–1.0)
pH: 6 (ref 5.0–8.0)

## 2014-08-09 LAB — CBG MONITORING, ED
GLUCOSE-CAPILLARY: 581 mg/dL — AB (ref 70–99)
Glucose-Capillary: >600 mg/dL (ref 70–99)
Glucose-Capillary: 525 mg/dL — ABNORMAL HIGH (ref 70–99)

## 2014-08-09 LAB — MRSA PCR SCREENING: MRSA BY PCR: NEGATIVE

## 2014-08-09 MED ORDER — SODIUM CHLORIDE 0.9 % IV SOLN
INTRAVENOUS | Status: DC
  Administered 2014-08-09: 6.3 [IU]/h via INTRAVENOUS

## 2014-08-09 MED ORDER — SODIUM CHLORIDE 0.9 % IV SOLN: INTRAVENOUS | Status: AC

## 2014-08-09 MED ORDER — DEXTROSE-NACL 5-0.45 % IV SOLN
INTRAVENOUS | Status: DC
  Administered 2014-08-09 – 2014-08-10 (×2): via INTRAVENOUS

## 2014-08-09 MED ORDER — SODIUM CHLORIDE 0.9 % IV SOLN: 250.0000 mL | INTRAVENOUS | Status: DC | PRN

## 2014-08-09 MED ORDER — ENOXAPARIN SODIUM 40 MG/0.4ML ~~LOC~~ SOLN
40.0000 mg | SUBCUTANEOUS | Status: DC
  Administered 2014-08-09 – 2014-08-12 (×4): 40 mg via SUBCUTANEOUS
  Filled 2014-08-09 (×5): qty 0.4

## 2014-08-09 MED ORDER — SODIUM CHLORIDE 0.9 % IV SOLN
INTRAVENOUS | Status: DC
  Administered 2014-08-09: 3.7 [IU]/h via INTRAVENOUS
  Administered 2014-08-09: 5.4 [IU]/h via INTRAVENOUS
  Filled 2014-08-09: qty 2.5

## 2014-08-09 MED ORDER — ENOXAPARIN SODIUM 30 MG/0.3ML ~~LOC~~ SOLN: 30.0000 mg | SUBCUTANEOUS | Status: DC

## 2014-08-09 MED ORDER — SODIUM CHLORIDE 0.9 % IV SOLN
INTRAVENOUS | Status: DC
  Administered 2014-08-09 – 2014-08-10 (×2): via INTRAVENOUS

## 2014-08-09 MED ORDER — DEXTROSE 5 % IV SOLN
2.0000 g | INTRAVENOUS | Status: DC
  Administered 2014-08-10 – 2014-08-12 (×3): 2 g via INTRAVENOUS
  Filled 2014-08-09 (×4): qty 2

## 2014-08-09 MED ORDER — DEXTROSE 50 % IV SOLN
25.0000 mL | INTRAVENOUS | Status: DC | PRN
  Filled 2014-08-09: qty 50

## 2014-08-09 MED ORDER — DEXTROSE-NACL 5-0.45 % IV SOLN
INTRAVENOUS | Status: DC
Start: 2014-08-09 — End: 2014-08-10

## 2014-08-09 MED ORDER — SODIUM CHLORIDE 0.9 % IV BOLUS (SEPSIS)
1000.0000 mL | Freq: Once | INTRAVENOUS | Status: AC
  Administered 2014-08-09: 1000 mL via INTRAVENOUS

## 2014-08-09 MED ORDER — DEXTROSE 50 % IV SOLN: 25.0000 mL | INTRAVENOUS | Status: DC | PRN

## 2014-08-09 MED ORDER — HYDROMORPHONE HCL 1 MG/ML IJ SOLN: 0.5000 mg | INTRAMUSCULAR | Status: DC | PRN

## 2014-08-09 MED ORDER — DEXTROSE-NACL 5-0.45 % IV SOLN: INTRAVENOUS | Status: DC

## 2014-08-09 MED ORDER — DEXTROSE 5 % IV SOLN
2.0000 g | Freq: Once | INTRAVENOUS | Status: AC
  Administered 2014-08-09: 2 g via INTRAVENOUS
  Filled 2014-08-09: qty 2

## 2014-08-09 MED ORDER — SODIUM CHLORIDE 0.9 % IV SOLN
INTRAVENOUS | Status: DC
  Administered 2014-08-09: 1.6 [IU]/h via INTRAVENOUS
  Administered 2014-08-09: 8.3 [IU]/h via INTRAVENOUS
  Administered 2014-08-09: 4.5 [IU]/h via INTRAVENOUS
  Administered 2014-08-09: 9.1 [IU]/h via INTRAVENOUS
  Administered 2014-08-09: 2.7 [IU]/h via INTRAVENOUS
  Administered 2014-08-09: 1.3 [IU]/h via INTRAVENOUS
  Administered 2014-08-09: 10 [IU]/h via INTRAVENOUS
  Filled 2014-08-09: qty 2.5

## 2014-08-09 MED ORDER — SODIUM CHLORIDE 0.9 % IJ SOLN
3.0000 mL | Freq: Two times a day (BID) | INTRAMUSCULAR | Status: DC
  Administered 2014-08-09 – 2014-08-13 (×5): 3 mL via INTRAVENOUS

## 2014-08-09 MED ORDER — ACETAMINOPHEN 650 MG RE SUPP
650.0000 mg | Freq: Four times a day (QID) | RECTAL | Status: DC | PRN
Start: 2014-08-09 — End: 2014-08-13

## 2014-08-09 MED ORDER — INSULIN ASPART 100 UNIT/ML ~~LOC~~ SOLN: 0.0000 [IU] | Freq: Every day | SUBCUTANEOUS | Status: DC

## 2014-08-09 MED ORDER — ONDANSETRON HCL 4 MG/2ML IJ SOLN
4.0000 mg | Freq: Four times a day (QID) | INTRAMUSCULAR | Status: DC | PRN
  Administered 2014-08-11 – 2014-08-12 (×2): 4 mg via INTRAVENOUS
  Filled 2014-08-09 (×2): qty 2

## 2014-08-09 MED ORDER — OXYCODONE HCL 5 MG PO TABS: 5.0000 mg | ORAL_TABLET | ORAL | Status: DC | PRN

## 2014-08-09 MED ORDER — SODIUM CHLORIDE 0.9 % IV SOLN
1000.0000 mL | Freq: Once | INTRAVENOUS | Status: AC
  Administered 2014-08-09: 1000 mL via INTRAVENOUS

## 2014-08-09 MED ORDER — POTASSIUM CHLORIDE 10 MEQ/100ML IV SOLN
10.0000 meq | INTRAVENOUS | Status: AC
  Administered 2014-08-09 (×4): 10 meq via INTRAVENOUS
  Filled 2014-08-09 (×6): qty 100

## 2014-08-09 MED ORDER — SODIUM CHLORIDE 0.9 % IJ SOLN: 3.0000 mL | INTRAMUSCULAR | Status: DC | PRN

## 2014-08-09 MED ORDER — INSULIN ASPART 100 UNIT/ML ~~LOC~~ SOLN
0.0000 [IU] | Freq: Three times a day (TID) | SUBCUTANEOUS | Status: DC
  Administered 2014-08-09: 2 [IU] via SUBCUTANEOUS
  Administered 2014-08-10: 3 [IU] via SUBCUTANEOUS
  Administered 2014-08-10: 20 [IU] via SUBCUTANEOUS
  Administered 2014-08-11: 5 [IU] via SUBCUTANEOUS

## 2014-08-09 MED ORDER — INSULIN DETEMIR 100 UNIT/ML ~~LOC~~ SOLN
47.0000 [IU] | Freq: Every day | SUBCUTANEOUS | Status: DC
  Administered 2014-08-09 – 2014-08-10 (×2): 47 [IU] via SUBCUTANEOUS
  Filled 2014-08-09 (×3): qty 0.47

## 2014-08-09 MED ORDER — ACETAMINOPHEN 325 MG PO TABS
650.0000 mg | ORAL_TABLET | Freq: Four times a day (QID) | ORAL | Status: DC | PRN
  Administered 2014-08-12: 650 mg via ORAL
  Filled 2014-08-09: qty 2

## 2014-08-09 MED ORDER — ONDANSETRON HCL 4 MG PO TABS: 4.0000 mg | ORAL_TABLET | Freq: Four times a day (QID) | ORAL | Status: DC | PRN

## 2014-08-09 MED ORDER — SACCHAROMYCES BOULARDII 250 MG PO CAPS
500.0000 mg | ORAL_CAPSULE | Freq: Every day | ORAL | Status: DC
  Administered 2014-08-09 – 2014-08-13 (×5): 500 mg via ORAL
  Filled 2014-08-09 (×5): qty 2

## 2014-08-09 MED ORDER — LATANOPROST 0.005 % OP SOLN
1.0000 [drp] | Freq: Every day | OPHTHALMIC | Status: DC
  Administered 2014-08-09 – 2014-08-12 (×4): 1 [drp] via OPHTHALMIC
  Filled 2014-08-09: qty 2.5

## 2014-08-09 MED ORDER — SODIUM CHLORIDE 0.9 % IV SOLN
INTRAVENOUS | Status: DC
  Administered 2014-08-09: 07:00:00 via INTRAVENOUS

## 2014-08-09 NOTE — ED Provider Notes (Addendum)
CSN: [id number]     Arrival date & time 08/09/14  0135 History   First MD Initiated Contact with Patient 08/09/14 0147     Chief Complaint  Patient presents with  . Hyperglycemia     (Consider location/radiation/quality/duration/timing/severity/associated sxs/prior Treatment) HPI  This is a 73 year old female with a history of hypertension, hyperlipidemia, diabetes who presents with hyperglycemia. Patient reported to be "a brittle diabetic." She has had persistently elevated glucoses this evening. Patient reports generalized fatigue and "not feeling well" over the last 24 hours. Denies any fevers, chest pain, shortness of breath. Does endorse dysuria. Denies any headache. Glucose for EMS was 570.   Past Medical History  Diagnosis Date  . Hypertension   . Hyperlipidemia   . TIA (transient ischemic attack) 1990's    "mini strokes" (06/07/2013)  . Legally blind     "both eyes; some vision in the right' (06/07/2013)  . Heart murmur 1960's  . Hypothyroidism   . Type II diabetes mellitus   . Anemia   . GERD (gastroesophageal reflux disease)   . Arthritis     "left leg" (06/07/2013)  . Depression   . Vulva cancer   . Altered mental status     "the first time I noticed any problem was 2 d ago" (06/07/2013)  . Aortic stenosis     mod-severe   Past Surgical History  Procedure Laterality Date  . Femur fracture surgery Left 1999  . Cesarean section  1968  . Tubal ligation  1970's  . Vulva surgery  1990's    "cut a big chunk out for cancer" (06/07/2013)  . Transthoracic echocardiogram  02/2013    EF 55-60%, mod conc hypertrophy, grade 1 diastolic dysfunction; AV with mod stenosis; calcified MV annulus & mild MR; LA severely dilated; PA peak pressure 97mmHg   Family History  Problem Relation Age of Onset  . Hypertension Mother   . Hyperlipidemia Mother   . Hyperlipidemia Father   . Hypertension Father   . Diabetes Father    History  Substance Use Topics  . Smoking status: Former  Smoker -- 4.00 packs/day for 30 years    Types: Cigarettes    Quit date: 09/14/1987  . Smokeless tobacco: Never Used     Comment: 06/07/2013 "hasn't smoked since her stroke in the 1990's"  . Alcohol Use: No   OB History   Grav Para Term Preterm Abortions TAB SAB Ect Mult Living                 Review of Systems  Constitutional: Positive for fatigue. Negative for fever.  Respiratory: Negative for cough, chest tightness and shortness of breath.   Cardiovascular: Negative for chest pain.  Gastrointestinal: Negative for nausea, vomiting and abdominal pain.  Endocrine: Positive for polydipsia and polyuria.  Genitourinary: Positive for dysuria.  Musculoskeletal: Negative for back pain.  Skin: Negative for rash.  Psychiatric/Behavioral: Negative for confusion.  All other systems reviewed and are negative.     Allergies  Ciprofloxacin; Codeine; Hydrochlorothiazide w-triamterene; Metformin and related; Ticlopidine hcl; and Amoxicillin  Home Medications   Prior to Admission medications   Medication Sig Start Date End Date Taking? Authorizing Provider  amLODipine (NORVASC) 5 MG tablet Take 1 tablet (5 mg total) by mouth daily. 11/29/13  Yes Geradine Girt, DO  atorvastatin (LIPITOR) 20 MG tablet Take 20 mg by mouth at bedtime.   Yes Historical Provider, MD  calcium-vitamin D (OSCAL WITH D) 500-200 MG-UNIT per tablet Take 1 tablet by  mouth 2 (two) times daily.   Yes Historical Provider, MD  carvedilol (COREG) 25 MG tablet Take 1 tablet (25 mg total) by mouth 2 (two) times daily with a meal. 10/12/13  Yes Allie Bossier, MD  clobetasol cream (TEMOVATE) 5.09 % Apply 1 application topically 2 (two) times daily.   Yes Historical Provider, MD  Cranberry-Vitamin C-Inulin (UTI-STAT) LIQD Take 30 mLs by mouth 2 (two) times daily.   Yes Historical Provider, MD  dipyridamole-aspirin (AGGRENOX) 200-25 MG per 12 hr capsule Take 1 capsule by mouth 2 (two) times daily. 06/09/13  Yes Shanker Kristeen Mans, MD   DULoxetine (CYMBALTA) 20 MG capsule Take 20 mg by mouth daily.   Yes Historical Provider, MD  DULoxetine (CYMBALTA) 30 MG capsule Take 30 mg by mouth daily.   Yes Historical Provider, MD  DULoxetine (CYMBALTA) 60 MG capsule Take 60 mg by mouth daily.   Yes Historical Provider, MD  ezetimibe (ZETIA) 10 MG tablet Take 10 mg by mouth at bedtime.    Yes Historical Provider, MD  ferrous fumarate (HEMOCYTE - 106 MG FE) 325 (106 FE) MG TABS tablet Take 2 tablets by mouth daily.    Yes Historical Provider, MD  insulin detemir (LEVEMIR) 100 UNIT/ML injection Inject 47 Units into the skin at bedtime.   Yes Historical Provider, MD  insulin lispro (HUMALOG) 100 UNIT/ML injection Inject 2-12 Units into the skin 3 (three) times daily before meals.   Yes Historical Provider, MD  latanoprost (XALATAN) 0.005 % ophthalmic solution Place 1 drop into both eyes at bedtime.   Yes Historical Provider, MD  levothyroxine (SYNTHROID, LEVOTHROID) 75 MCG tablet Take 1 tablet (75 mcg total) by mouth daily before breakfast. 06/07/13  Yes Nishant Dhungel, MD  Memantine HCl ER (NAMENDA XR) 7 MG CP24 Take 7 mg by mouth at bedtime.    Yes Historical Provider, MD  Multiple Vitamin (MULTIVITAMIN WITH MINERALS) TABS tablet Take 1 tablet by mouth daily.   Yes Historical Provider, MD  Omega-3 Fatty Acids (FISH OIL) 1000 MG CAPS Take 2,000 mg by mouth daily.   Yes Historical Provider, MD  potassium chloride SA (K-DUR,KLOR-CON) 20 MEQ tablet Take 20 mEq by mouth 2 (two) times daily.   Yes Historical Provider, MD  promethazine (PHENERGAN) 12.5 MG tablet Take 12.5 mg by mouth every 6 (six) hours as needed for nausea or vomiting.   Yes Historical Provider, MD  saccharomyces boulardii (FLORASTOR) 250 MG capsule Take 500 mg by mouth daily.   Yes Historical Provider, MD  potassium chloride SA (K-DUR,KLOR-CON) 20 MEQ tablet Take 1 tablet (20 mEq total) by mouth daily. 01/05/14 01/08/14  Kinnie Feil, MD   BP 128/65  Pulse 88  Temp(Src) 98.8  F (37.1 C) (Oral)  Resp 20  SpO2 90% Physical Exam  Nursing note and vitals reviewed. Constitutional: She is oriented to person, place, and time.  Ill-appearing but nontoxic  HENT:  Head: Normocephalic and atraumatic.  Cardiovascular: Normal rate and regular rhythm.   Murmur heard. harsh systolic murmur  Pulmonary/Chest: Effort normal. No respiratory distress. She has no wheezes.  Abdominal: Soft. Bowel sounds are normal. There is no tenderness. There is no rebound and no guarding.  Musculoskeletal: She exhibits no edema.  Neurological: She is alert and oriented to person, place, and time.  Skin: Skin is warm and dry.    ED Course  Procedures (including critical care time)  CRITICAL CARE Performed by: Thayer Jew, F   Total critical care time: 35 min  Critical  care time was exclusive of separately billable procedures and treating other patients.  Critical care was necessary to treat or prevent imminent or life-threatening deterioration.  Critical care was time spent personally by me on the following activities: development of treatment plan with patient and/or surrogate as well as nursing, discussions with consultants, evaluation of patient's response to treatment, examination of patient, obtaining history from patient or surrogate, ordering and performing treatments and interventions, ordering and review of laboratory studies, ordering and review of radiographic studies, pulse oximetry and re-evaluation of patient's condition.  Labs Review Labs Reviewed  CBC - Abnormal; Notable for the following:    Platelets 139 (*)    All other components within normal limits  COMPREHENSIVE METABOLIC PANEL - Abnormal; Notable for the following:    Sodium 134 (*)    Chloride 94 (*)    Glucose, Bld 608 (*)    Albumin 3.0 (*)    AST 42 (*)    GFR calc non Af Amer 56 (*)    GFR calc Af Amer 65 (*)    Anion gap 18 (*)    All other components within normal limits  URINALYSIS,  ROUTINE W REFLEX MICROSCOPIC - Abnormal; Notable for the following:    APPearance TURBID (*)    Glucose, UA >1000 (*)    Hgb urine dipstick MODERATE (*)    Ketones, ur 40 (*)    Nitrite POSITIVE (*)    Leukocytes, UA LARGE (*)    All other components within normal limits  URINE MICROSCOPIC-ADD ON - Abnormal; Notable for the following:    Squamous Epithelial / LPF FEW (*)    Bacteria, UA MANY (*)    All other components within normal limits  CBG MONITORING, ED - Abnormal; Notable for the following:    Glucose-Capillary 581 (*)    All other components within normal limits  URINE CULTURE  CBG MONITORING, ED    Imaging Review No results found.   EKG Interpretation   Date/Time:  Thursday August 09 2014 01:55:25 EDT Ventricular Rate:  89 PR Interval:  202 QRS Duration: 103 QT Interval:  403 QTC Calculation: 490 R Axis:   -68 Text Interpretation:  Sinus rhythm Probable left atrial enlargement  similar to prior Confirmed by Kaelob Persky  MD, [place] ([id number]) on 08/09/2014  2:34:56 AM      MDM   Final diagnoses:  UTI (lower urinary tract infection)  Diabetic ketoacidosis without coma associated with other specified diabetes mellitus    Patient presents with hyperglycemia. Also endorses fatigue and dysuria. She is afebrile. Ill-appearing but nontoxic. Initial blood glucose 581. Patient given a normal saline bolus. Last EF 55-60%. Workup notable for nitrite-positive urine with many bacteria and too numerous to count white cells. Urine sent for culture and patient given 2 g of Rocephin. No evidence of leukocytosis. Glucose on CMP 608 with an anion gap of 18. Patient also noted to have ketones in her urine. For this reason, will initiate glucose stabilizer. Suspect hyperglycemia/DKA in the setting of acute urinary tract infection. Plan to admit to the hospitalist.      Merryl Hacker, MD 08/09/14 [place], MD 08/09/14 [phone]

## 2014-08-09 NOTE — Progress Notes (Signed)
Inpatient Diabetes Program Recommendations  AACE/ADA: New Consensus Statement on Inpatient Glycemic Control (2013)  Target Ranges:  Prepandial:   less than 140 mg/dL      Peak postprandial:   less than 180 mg/dL (1-2 hours)      Critically ill patients:  140 - 180 mg/dL     Results for Regina [name], Regina [name] (MRN [id number]) as of 08/09/2014 14:39  Ref. Range 08/09/2014 01:46  Sodium Latest Range: 137-147 mEq/L 134 (L)  Potassium Latest Range: 3.7-5.3 mEq/L 5.1  Chloride Latest Range: 96-112 mEq/L 94 (L)  CO2 Latest Range: 19-32 mEq/L 22  BUN Latest Range: 6-23 mg/dL 21  Creatinine Latest Range: 0.50-1.10 mg/dL 0.99  Calcium Latest Range: 8.4-10.5 mg/dL 8.9  GFR calc non Af Amer Latest Range: >90 mL/min 56 (L)  GFR calc Af Amer Latest Range: >90 mL/min 65 (L)  Glucose Latest Range: 70-99 mg/dL 608 (HH)  Anion gap Latest Range: 5-15  18 (H)     Admitted from SNF with hyperglycemia/ UTI.  Started on DKA Protocol order set.     SNF DM Meds: Levemir 47 units QHS    Humalog 2-12 units tid per SSI   Current DM Meds: IV insulin drip per GlucoStabilizer    **Note BMET at 12 noon today showed CO2 of 24 and Anion Gap of 12.  Patient likely ready to transition off IV insulin drip.   MD- When patient ready to transition off IV insulin drip, please make sure she gets her home dose of Lantus 1-2 hours before IV insulin drip stopped.  Please consider the following orders: 1. Start Lantus 47 units daily- Give 1st dose now and then stop IV insulin drip 1-2 hours after Lantus given 2. Start Novolog Moderate SSI immediately upon d/c of IV insulin drip     Will follow Wyn Quaker RN, MSN, CDE Diabetes Coordinator Inpatient Diabetes Program Team Pager: [phone] (8a-10p)

## 2014-08-09 NOTE — ED Notes (Signed)
Per ems-- pt from [facility]. Staff reports pt is a brittle diabetic. This evening her blood sugars have consecutively read high which is unusual for pt. cbg 570 with ems. Pt sts she has not felt well since last night. Denies nvd.

## 2014-08-09 NOTE — Progress Notes (Signed)
Pt admitted from ED on glucostablizer, pt a/f, no c/o pain, daughter at bedside, pt put on tele per orders, last CBG 377 insulin gtt changed to 6.3, pt stable

## 2014-08-09 NOTE — ED Notes (Signed)
Pt aware urine is needed.  

## 2014-08-09 NOTE — H&P (Signed)
[facility] Admission History and Physical       Regina [name] IWP:[id number] DOB: [birth year] DOA: 08/09/2014  Referring physician: EDP PCP: Delia Chimes, NP  Specialists:  Chief Complaint:  Critical Glucose levels  HPI: Regina [name] is a 73 y.o. female with a history of Type II DM, Diabetic Retinopathy with Legal Blindness, HTN, AS,  Hypothyroid, Hyperlipidemia, and history of Vulvar Cancer S?P  Surgical Resection who was sent from the [facility] SNF due to critically high Glucose levels during the day.  Per the daughter who is at the bedside, her mother's glucose levels had been increasing over the past 3 days and had been in the 300's and 400's but today it was critically high.   She was evaluated in the ED and was found to have a glucose level of 581, and was found to have DKA.  She was placed on the DKA protocol and referred for medical admission  Review of Systems: Unable to Obtain from the Patient  Past Medical History  Diagnosis Date  . Hypertension   . Hyperlipidemia   . TIA (transient ischemic attack) 1990's    "mini strokes" (06/07/2013)  . Legally blind     "both eyes; some vision in the right' (06/07/2013)  . Heart murmur 1960's  . Hypothyroidism   . Type II diabetes mellitus   . Anemia   . GERD (gastroesophageal reflux disease)   . Arthritis     "left leg" (06/07/2013)  . Depression   . Vulva cancer   . Altered mental status     "the first time I noticed any problem was 2 d ago" (06/07/2013)  . Aortic stenosis     mod-severe      Past Surgical History  Procedure Laterality Date  . Femur fracture surgery Left 1999  . Cesarean section  1968  . Tubal ligation  1970's  . Vulva surgery  1990's    "cut a big chunk out for cancer" (06/07/2013)  . Transthoracic echocardiogram  02/2013    EF 55-60%, mod conc hypertrophy, grade 1 diastolic dysfunction; AV with mod stenosis; calcified MV annulus & mild MR; LA severely dilated; PA peak pressure 78mmHg       Prior  to Admission medications   Medication Sig Start Date End Date Taking? Authorizing Provider  amLODipine (NORVASC) 5 MG tablet Take 1 tablet (5 mg total) by mouth daily. 11/29/13  Yes Geradine Girt, DO  atorvastatin (LIPITOR) 20 MG tablet Take 20 mg by mouth at bedtime.   Yes Historical Provider, MD  calcium-vitamin D (OSCAL WITH D) 500-200 MG-UNIT per tablet Take 1 tablet by mouth 2 (two) times daily.   Yes Historical Provider, MD  carvedilol (COREG) 25 MG tablet Take 1 tablet (25 mg total) by mouth 2 (two) times daily with a meal. 10/12/13  Yes Allie Bossier, MD  clobetasol cream (TEMOVATE) 5.05 % Apply 1 application topically 2 (two) times daily.   Yes Historical Provider, MD  Cranberry-Vitamin C-Inulin (UTI-STAT) LIQD Take 30 mLs by mouth 2 (two) times daily.   Yes Historical Provider, MD  dipyridamole-aspirin (AGGRENOX) 200-25 MG per 12 hr capsule Take 1 capsule by mouth 2 (two) times daily. 06/09/13  Yes Shanker Kristeen Mans, MD  DULoxetine (CYMBALTA) 20 MG capsule Take 20 mg by mouth daily.   Yes Historical Provider, MD  DULoxetine (CYMBALTA) 30 MG capsule Take 30 mg by mouth daily.   Yes Historical Provider, MD  DULoxetine (CYMBALTA) 60 MG capsule Take 60 mg by  mouth daily.   Yes Historical Provider, MD  ezetimibe (ZETIA) 10 MG tablet Take 10 mg by mouth at bedtime.    Yes Historical Provider, MD  ferrous fumarate (HEMOCYTE - 106 MG FE) 325 (106 FE) MG TABS tablet Take 2 tablets by mouth daily.    Yes Historical Provider, MD  insulin detemir (LEVEMIR) 100 UNIT/ML injection Inject 47 Units into the skin at bedtime.   Yes Historical Provider, MD  insulin lispro (HUMALOG) 100 UNIT/ML injection Inject 2-12 Units into the skin 3 (three) times daily before meals.   Yes Historical Provider, MD  latanoprost (XALATAN) 0.005 % ophthalmic solution Place 1 drop into both eyes at bedtime.   Yes Historical Provider, MD  levothyroxine (SYNTHROID, LEVOTHROID) 75 MCG tablet Take 1 tablet (75 mcg total) by mouth  daily before breakfast. 06/07/13  Yes Nishant Dhungel, MD  Memantine HCl ER (NAMENDA XR) 7 MG CP24 Take 7 mg by mouth at bedtime.    Yes Historical Provider, MD  Multiple Vitamin (MULTIVITAMIN WITH MINERALS) TABS tablet Take 1 tablet by mouth daily.   Yes Historical Provider, MD  Omega-3 Fatty Acids (FISH OIL) 1000 MG CAPS Take 2,000 mg by mouth daily.   Yes Historical Provider, MD  potassium chloride SA (K-DUR,KLOR-CON) 20 MEQ tablet Take 20 mEq by mouth 2 (two) times daily.   Yes Historical Provider, MD  promethazine (PHENERGAN) 12.5 MG tablet Take 12.5 mg by mouth every 6 (six) hours as needed for nausea or vomiting.   Yes Historical Provider, MD  saccharomyces boulardii (FLORASTOR) 250 MG capsule Take 500 mg by mouth daily.   Yes Historical Provider, MD  potassium chloride SA (K-DUR,KLOR-CON) 20 MEQ tablet Take 1 tablet (20 mEq total) by mouth daily. 01/05/14 01/08/14  Kinnie Feil, MD      Allergies  Allergen Reactions  . Ciprofloxacin Hives  . Codeine Other (See Comments)    REACTION: nervous, shaky  . Hydrochlorothiazide W-Triamterene Other (See Comments)    REACTION: hyponatremia  . Metformin And Related Other (See Comments)    Un specified  . Ticlopidine Hcl Other (See Comments)    REACTION: bleeding  . Amoxicillin Rash     Social History:  reports that she quit smoking about 26 years ago. Her smoking use included Cigarettes. She has a 120 pack-year smoking history. She has never used smokeless tobacco. She reports that she does not drink alcohol or use illicit drugs.     Family History  Problem Relation Age of Onset  . Hypertension Mother   . Hyperlipidemia Mother   . Hyperlipidemia Father   . Hypertension Father   . Diabetes Father        Physical Exam:  GEN:  Pleasant  73 y.o. female  examined  and in no acute distress; cooperative with exam Filed Vitals:   08/09/14 0400 08/09/14 0405 08/09/14 0445 08/09/14 0535  BP: 126/56   139/68  Pulse: 84  85 83  Temp:     98.3 F (36.8 C)  TempSrc:    Oral  Resp: 18  15 17   Height:  5\' 2"  (1.575 m)  5\' 2"  (1.575 m)  Weight:  81.194 kg (179 lb)  80.967 kg (178 lb 8 oz)  SpO2: 92%  94% 100%   Blood pressure 139/68, pulse 83, temperature 98.3 F (36.8 C), temperature source Oral, resp. rate 17, height 5\' 2"  (1.575 m), weight 80.967 kg (178 lb 8 oz), SpO2 100.00%. PSYCH: She is alert and oriented x2; does not appear  anxious does not appear depressed; affect is normal HEENT: Normocephalic and Atraumatic, Mucous membranes pink; PERRLA; EOM intact; Fundi:  Benign;  No scleral icterus, Nares: Patent, Oropharynx: Clear, Fair Dentition,    Neck:  FROM, No Cervical Lymphadenopathy nor Thyromegaly or Carotid Bruit; No JVD; Breasts:: Not examined CHEST WALL: No tenderness CHEST: Normal respiration, clear to auscultation bilaterally HEART: Regular rate and rhythm; no murmurs rubs or gallops BACK: No kyphosis or scoliosis; No CVA tenderness ABDOMEN: Positive Bowel Sounds, Obese, Soft Non-Tender; No Masses, No Organomegaly Rectal Exam: Not done EXTREMITIES: No Cyanosis, Clubbing, or Edema; No Ulcerations. Genitalia: not examined PULSES: 2+ and symmetric SKIN: Normal hydration no rash or ulceration CNS:  Alert and Oriented x 2,  Legal Blindness, Able to Move All 4 exts Vascular: pulses palpable throughout    Labs on Admission:  Basic Metabolic Panel:  Recent Labs Lab 08/09/14 0146  NA 134*  K 5.1  CL 94*  CO2 22  GLUCOSE 608*  BUN 21  CREATININE 0.99  CALCIUM 8.9   Liver Function Tests:  Recent Labs Lab 08/09/14 0146  AST 42*  ALT 33  ALKPHOS 83  BILITOT 0.7  PROT 6.3  ALBUMIN 3.0*   No results found for this basename: LIPASE, AMYLASE,  in the last 168 hours No results found for this basename: AMMONIA,  in the last 168 hours CBC:  Recent Labs Lab 08/09/14 0146  WBC 6.6  HGB 13.0  HCT 39.8  MCV 92.3  PLT 139*   Cardiac Enzymes: No results found for this basename: CKTOTAL, CKMB,  CKMBINDEX, TROPONINI,  in the last 168 hours  BNP (last 3 results) No results found for this basename: PROBNP,  in the last 8760 hours CBG:  Recent Labs Lab 08/09/14 0144 08/09/14 0400 08/09/14 0503 08/09/14 0604  GLUCAP 581* >600* 525* 429*    Radiological Exams on Admission: No results found.   EKG: Independently reviewed.    Assessment/Plan:   73 y.o. female with   Principal Problem:   1.   DKA (diabetic ketoacidoses)   DKA Protocol with IV Insulin   Monitor Electrolytes     Active Problems:   2.   UTI (urinary tract infection)   Urine C+S Sent   IV Rocephin     3.   Hypothyroidism   On Levothyroxine resume when taking PO     4.   Pure hypercholesterolemia   REsume Atorvastatin when taking PO     5.   HTN (hypertension)   PRN IV Hydralazine while NPO     6.   Chronic diastolic CHF (congestive heart failure)   Monitor for Signs and Sxs of Fluid Overload    7.   Dementia   Resume Namenda Rx when taking PO     8.   DVT Prophylaxis   Lovenox      Code Status:       Family Communication:     Disposition Plan:         Time spent:   [facility] Pager [phone]   If 7AM -7PM Please Contact the Day Rounding Team MD for [facility]  If 7PM-7AM, Please Contact Night-Floor Coverage  [email or web address] Password [email or web address] 08/09/2014, 6:14 AM

## 2014-08-09 NOTE — Progress Notes (Signed)
[facility] PROGRESS NOTE  Regina [name] RXV:[id number] DOB: May 09, 1941 DOA: 08/09/2014 PCP: Delia Chimes, NP  Assessment/Plan: 1. Diabetic ketoacidosis -I suspect precipitated by underlying infectious process as UA consistent with UTI -She had been placed on the DKA protocol with her anion gap closing today. -Discontinued IV insulin, placed her back on her basal insulin and advance diet. -Patient showing clinical improvement over the course of the day. Blood sugars now on the low 100s.  2.  Urinary tract infection. -She has a history of recurrent urinary tract infections with prior urine cultures growing Escherichia coli, organism susceptible to beta lactams. -We'll continue ceftriaxone 1 g IV every 24 hours, follow-up on cultures.  3.  History of hypertension. -Blood pressure stable with last blood pressure reading of 124/62. -Currently off of antihypertensive agents  Code Status: Full code Family Communication: Spoke with her daughter present at bedside Disposition Plan: Patient transitioned to basal insulin with the discontinuation of IV insulin   Antibiotics:  Ceftriaxone  HPI/Subjective: Patient is a pleasant 73 year old female with a past medical history of insulin dependent diabetes mellitus, history of DKA, admitted to the medicine service on 08/09/2014. He presented with elevated blood sugar of 608, with lab show an anion gap of 18. She was also found to have urinary tract infection and started on ceftriaxone therapy. Patient was admitted to telemetry where she was started on IV insulin and DKA protocol.  Objective: Filed Vitals:   08/09/14 1348  BP: 124/62  Pulse: 80  Temp: 98.4 F (36.9 C)  Resp: 16    Intake/Output Summary (Last 24 hours) at 08/09/14 1816 Last data filed at 08/09/14 1200  Gross per 24 hour  Intake      0 ml  Output      0 ml  Net      0 ml   Filed Weights   08/09/14 0405 08/09/14 0535  Weight: 81.194 kg (179 lb) 80.967 kg (178 lb 8  oz)    Exam:   General:  patient is awake and alert, following commands  Cardiovascular: regular rate and rhythm normal S1-S2  Respiratory: clear to auscultation bilaterally   Abdomen: soft nontender nondistended   Musculoskeletal: no edema  Data Reviewed: Basic Metabolic Panel:  Recent Labs Lab 08/09/14 0906 08/09/14 1014 08/09/14 1148 08/09/14 1400 08/09/14 1655  NA 140  140 141 141 140 140  K 4.0  4.0 4.1 4.3 4.4 4.3  CL 102  103 104 105 104 103  CO2 24  25 26 24 24 26   GLUCOSE 320*  320* 255* 173* 122* 134*  BUN 18  18 18 17 15 14   CREATININE 0.80  0.82 0.80 0.77 0.70 0.69  CALCIUM 8.7  8.8 8.8 8.7 8.6 8.6   Liver Function Tests:  Recent Labs Lab 08/09/14 0146  AST 42*  ALT 33  ALKPHOS 83  BILITOT 0.7  PROT 6.3  ALBUMIN 3.0*   No results found for this basename: LIPASE, AMYLASE,  in the last 168 hours No results found for this basename: AMMONIA,  in the last 168 hours CBC:  Recent Labs Lab 08/09/14 0146 08/09/14 0906  WBC 6.6 5.2  HGB 13.0 12.0  HCT 39.8 35.6*  MCV 92.3 92.0  PLT 139* 136*   Cardiac Enzymes: No results found for this basename: CKTOTAL, CKMB, CKMBINDEX, TROPONINI,  in the last 168 hours BNP (last 3 results) No results found for this basename: PROBNP,  in the last 8760 hours CBG:  Recent Labs Lab 08/09/14 1226  08/09/14 1314 08/09/14 1427 08/09/14 1530 08/09/14 1655  GLUCAP 128* 113* 123* 125* 133*    Recent Results (from the past 240 hour(s))  MRSA PCR SCREENING     Status: None   Collection Time    08/09/14 11:48 AM      Result Value Ref Range Status   MRSA by PCR NEGATIVE  NEGATIVE Final   Comment:            The GeneXpert MRSA Assay (FDA     approved for NASAL specimens     only), is one component of a     comprehensive MRSA colonization     surveillance program. It is not     intended to diagnose MRSA     infection nor to guide or     monitor treatment for     MRSA infections.      Studies: No results found.  Scheduled Meds: . [START ON 08/10/2014] cefTRIAXone (ROCEPHIN)  IV  2 g Intravenous Q24H  . enoxaparin (LOVENOX) injection  40 mg Subcutaneous Q24H  . insulin aspart  0-15 Units Subcutaneous TID WC  . insulin aspart  0-5 Units Subcutaneous QHS  . insulin detemir  47 Units Subcutaneous Daily  . latanoprost  1 drop Both Eyes QHS  . saccharomyces boulardii  500 mg Oral Daily  . sodium chloride  3 mL Intravenous Q12H   Continuous Infusions: . sodium chloride 125 mL/hr at 08/09/14 0643  . sodium chloride 125 mL/hr at 08/09/14 0901  . dextrose 5 % and 0.45% NaCl    . dextrose 5 % and 0.45% NaCl    . dextrose 5 % and 0.45% NaCl 100 mL/hr at 08/09/14 1028    Principal Problem:   DKA (diabetic ketoacidoses) Active Problems:   Hypothyroidism   Pure hypercholesterolemia   HTN (hypertension)   Chronic diastolic CHF (congestive heart failure)   Dementia   UTI (urinary tract infection)    Time spent:     [name]  [facility] Pager [phone]. If 7PM-7AM, please contact night-coverage at [email or web address], password [facility] 08/09/2014, 6:16 PM  LOS: 0 days

## 2014-08-10 DIAGNOSIS — E1310 Other specified diabetes mellitus with ketoacidosis without coma: Principal | ICD-10-CM

## 2014-08-10 DIAGNOSIS — R5381 Other malaise: Secondary | ICD-10-CM

## 2014-08-10 DIAGNOSIS — I10 Essential (primary) hypertension: Secondary | ICD-10-CM

## 2014-08-10 LAB — GLUCOSE, CAPILLARY
GLUCOSE-CAPILLARY: 104 mg/dL — AB (ref 70–99)
GLUCOSE-CAPILLARY: 175 mg/dL — AB (ref 70–99)
GLUCOSE-CAPILLARY: 275 mg/dL — AB (ref 70–99)
GLUCOSE-CAPILLARY: 421 mg/dL — AB (ref 70–99)
GLUCOSE-CAPILLARY: 64 mg/dL — AB (ref 70–99)
Glucose-Capillary: 174 mg/dL — ABNORMAL HIGH (ref 70–99)
Glucose-Capillary: 42 mg/dL — CL (ref 70–99)
Glucose-Capillary: 120 mg/dL — ABNORMAL HIGH (ref 70–99)

## 2014-08-10 LAB — BASIC METABOLIC PANEL
Anion gap: 11 (ref 5–15)
BUN: 10 mg/dL (ref 6–23)
CALCIUM: 9.4 mg/dL (ref 8.4–10.5)
CO2: 27 meq/L (ref 19–32)
CREATININE: 0.61 mg/dL (ref 0.50–1.10)
Chloride: 105 mEq/L (ref 96–112)
GFR calc Af Amer: >90 mL/min (ref >90)
GFR, EST NON AFRICAN AMERICAN: 89 mL/min — AB (ref >90)
GLUCOSE: 43 mg/dL — AB (ref 70–99)
Potassium: 3.6 mEq/L — ABNORMAL LOW (ref 3.7–5.3)
Sodium: 143 mEq/L (ref 137–147)

## 2014-08-10 LAB — CBC
HEMATOCRIT: 41.2 % (ref 36.0–46.0)
Hemoglobin: 14 g/dL (ref 12.0–15.0)
MCH: 30.2 pg (ref 26.0–34.0)
MCHC: 34 g/dL (ref 30.0–36.0)
MCV: 89 fL (ref 78.0–100.0)
Platelets: 165 10*3/uL (ref 150–400)
RBC: 4.63 MIL/uL (ref 3.87–5.11)
RDW: 13.2 % (ref 11.5–15.5)
WBC: 9 10*3/uL (ref 4.0–10.5)

## 2014-08-10 MED ORDER — INSULIN DETEMIR 100 UNIT/ML ~~LOC~~ SOLN
40.0000 [IU] | Freq: Every day | SUBCUTANEOUS | Status: DC
  Filled 2014-08-10: qty 0.4

## 2014-08-10 MED ORDER — DEXTROSE 50 % IV SOLN
50.0000 mL | Freq: Once | INTRAVENOUS | Status: AC | PRN
  Administered 2014-08-10 – 2014-08-11 (×2): 50 mL via INTRAVENOUS

## 2014-08-10 MED ORDER — SODIUM CHLORIDE 0.9 % IV SOLN
INTRAVENOUS | Status: DC
  Administered 2014-08-10 – 2014-08-12 (×5): via INTRAVENOUS

## 2014-08-10 NOTE — Evaluation (Signed)
Occupational Therapy Evaluation and Discharge Patient Details Name: Regina [name] MRN: [id number] DOB: 1941-09-25 Today's Date: 08/10/2014    History of Present Illness Critically high glucose levels--DKA. PMHx: Type II DM, Diabetic Retinopathy with Legal Blindness, HTN, AS,  Hypothyroid, Hyperlipidemia, and history of Vulvar Cancer Surgical Resection    Clinical Impression   This 73 yo female admitted with above presents to acute OT with decreased mobility, decreased balance, decreased vision, decreased cognition all affecting her ability to care for herself and increasing burden of care on caregivers. She will benefit from follow up OT at SNF. Acute OT will sign off.    Follow Up Recommendations  SNF    Equipment Recommendations  None recommended by OT       Precautions / Restrictions Precautions Precautions: Fall Restrictions Weight Bearing Restrictions: No      Mobility Bed Mobility Overal bed mobility: Needs Assistance Bed Mobility: Rolling;Sidelying to Sit Rolling: Supervision Sidelying to sit: Mod assist       General bed mobility comments: Max A to scoot to EOB with use of bed pad  Transfers Overall transfer level: Needs assistance Equipment used:  (chair in front of her) Transfers: Sit to/from Stand Sit to Stand: Min assist              Balance Overall balance assessment: Needs assistance Sitting-balance support: Bilateral upper extremity supported;Feet supported Sitting balance-Leahy Scale: Fair     Standing balance support: Bilateral upper extremity supported Standing balance-Leahy Scale: Poor                              ADL Overall ADL's : Needs assistance/impaired Eating/Feeding: Set up;Bed level   Grooming: Set up;Supervision/safety;Sitting (EOB)   Upper Body Bathing: Supervision/ safety;Set up;Sitting (EOB)   Lower Body Bathing: Maximal assistance (with min A sit<>stand)   Upper Body Dressing : Sitting;Maximal assistance  (EOB)   Lower Body Dressing: Total assistance (with min A sit<>stand)   Toilet Transfer: Minimal assistance (3 steps forward and back x2 and then side step up to EOB)   Toileting- Clothing Manipulation and Hygiene: Total assistance (with min A sit<>stand)                         Pertinent Vitals/Pain Pain Assessment: No/denies pain     Hand Dominance Right   Extremity/Trunk Assessment Upper Extremity Assessment Upper Extremity Assessment: Overall WFL for tasks assessed   Lower Extremity Assessment Lower Extremity Assessment: Defer to PT evaluation       Communication Communication Communication: No difficulties   Cognition Arousal/Alertness: Awake/alert Behavior During Therapy: WFL for tasks assessed/performed Overall Cognitive Status: History of cognitive impairments - at baseline (per niece some of the answers she was giving me were not true)                                Home Living Family/patient expects to be discharged to:: Skilled nursing facility                                        Prior Functioning/Environment Level of Independence: Needs assistance  Gait / Transfers Assistance Needed: Says she usually does not ambulate, but self propels a W/C with feet and hands. Niece in room reports that she has  been known at times at the SNF to get up and just start walking around without anything ADL's / Homemaking Assistance Needed: Says she can do her grooming and eating, but has A for B/D--verified by niece        OT Diagnosis: Generalized weakness;Cognitive deficits;Blindness and low vision   OT Problem List: Decreased strength;Impaired balance (sitting and/or standing);Decreased cognition;Impaired vision/perception      OT Goals(Current goals can be found in the care plan section) Acute Rehab OT Goals Patient Stated Goal: to rest  OT Frequency:                End of Session Equipment Utilized During Treatment: Gait  belt  Activity Tolerance: Patient tolerated treatment well Patient left: in bed;with call bell/phone within reach;with bed alarm set   Time: 1342-1405 OT Time Calculation (min): 23 min Charges:  OT General Charges $OT Visit: 1 Procedure OT Evaluation $Initial OT Evaluation Tier I: 1 Procedure OT Treatments $Self Care/Home Management : 8-22 mins  Almon Register [phone] 08/10/2014, 2:18 PM

## 2014-08-10 NOTE — Progress Notes (Signed)
Lab reported critical lab CBG at 43.  Pt. Alert and verbal, no dizziness verbalized.  Administered PRN 50% Dextrose Injection 25 grams (0.5g/mL). Will recheck CBG in 15 mins.  MD made aware.

## 2014-08-10 NOTE — Progress Notes (Signed)
At 2349H, CBG=104.  Pt. States, "I'm ok now".  Will continue to monitor.

## 2014-08-10 NOTE — Clinical Social Work Psychosocial (Signed)
Clinical Social Work Department BRIEF PSYCHOSOCIAL ASSESSMENT 08/10/2014  Patient:  Regina [name], Regina [name]     Account Number:  [id number]     Admit date:  08/09/2014  Clinical Social Worker:  Delrae Sawyers  Date/Time:  08/10/2014 03:40 PM  Referred by:  Physician  Date Referred:  08/10/2014 Referred for  SNF Placement   Other Referral:   none.   Interview type:  Family Other interview type:   CSW spoke with pt's daughter, Levonne Lapping [phone]).    PSYCHOSOCIAL DATA Living Status:  FACILITY Admitted from facility:  [place] Level of care:  [place] Primary support name:  Levonne Lapping Primary support relationship to patient:  CHILD, ADULT Degree of support available:   Strong support system.    CURRENT CONCERNS Current Concerns  Post-Acute Placement   Other Concerns:   none.    SOCIAL WORK ASSESSMENT / PLAN CSW received consult stating pt admitted from a facility [facility]). CSW attempted to speak with pt, however, pt was asleep. CSW spoke with pt's daughter regarding discharge disposition. Pt's daughter informed CSW pt is a long-term resident at [facility] and pt's family prefers for pt to return at time of discharge. CSW to continue to follow and assist with discharge planning needs.   Assessment/plan status:  Psychosocial Support/Ongoing Assessment of Needs Other assessment/ plan:   none.   Information/referral to community resources:   Pt to return to [facility] at time of discharge.    PATIENT'S/FAMILY'S RESPONSE TO PLAN OF CARE: Pt's daughter understanding and agreeable to CSW plan of care. Pt's daughter expressed no further questions or concerns at this time.       Lubertha Sayres, [place] ([phone]) Licensed Clinical Social Worker Orthopedics [phone]) and Surgical [phone])

## 2014-08-10 NOTE — Progress Notes (Signed)
PT Cancellation Note  Patient Details Name: Regina [name] MRN: [id number] DOB: 02-03-1941   Cancelled Treatment:    Reason Eval/Treat Not Completed: Patient declined, no reason specified This therapist awoke pt from sleeping and pt declined participating in therapy reporting, "I am cold I am cold!" Fell asleep. Will follow up next available time or when pt willing to participate.   Candy Sledge A 08/10/2014, 4:10 PM Candy Sledge, [place], DPT [phone]

## 2014-08-10 NOTE — Progress Notes (Signed)
Pt. CBG recheck at 0606H = 174.  Pt. Alert and responsive.

## 2014-08-10 NOTE — Progress Notes (Addendum)
Inpatient Diabetes Program Recommendations  AACE/ADA: New Consensus Statement on Inpatient Glycemic Control (2013)  Target Ranges:  Prepandial:   less than 140 mg/dL      Peak postprandial:   less than 180 mg/dL (1-2 hours)      Critically ill patients:  140 - 180 mg/dL     Results for Regina [name], Regina [name] (MRN [id number]) as of 08/10/2014 10:09  Ref. Range 08/10/2014 05:46 08/10/2014 06:06 08/10/2014 07:29  Glucose-Capillary Latest Range: 70-99 mg/dL 42 (LL) 174 (H) 120 (H)     Hypoglycemic this AM (CBG 42 mg/dl).  Corrected with D50%.    Patient received 47 units Levemir yesterday at 5pm to transition off IV insulin drip.    MD- Please consider reducing Levemir to 40 units daily     Will follow Wyn Quaker RN, MSN, CDE Diabetes Coordinator Inpatient Diabetes Program Team Pager: [phone] (8a-10p)

## 2014-08-10 NOTE — Progress Notes (Signed)
[facility] PROGRESS NOTE  Regina [name] [id number] DOB: Oct 26, 1940 DOA: 08/09/2014 PCP: Delia Chimes, NP  Assessment/Plan: 1. Diabetic ketoacidosis -I suspect precipitated by underlying infectious process as UA consistent with UTI -She had been placed on the DKA protocol with her anion gap closing on 08/09/2014 -Discontinued IV insulin, placed her back on her basal insulin and advance diet. -Patient's blood sugars this morning dropping to 42 for which she was given D50. Her sugars then increasing to 421 later in the day. -Diabetic coordinator recommending changing Levemir dose to 40 units  2.  Urinary tract infection. -She has a history of recurrent urinary tract infections with prior urine cultures growing Escherichia coli, organism susceptible to beta lactams. -Will continue ceftriaxone 1 g IV every 24 hours, urine cultures growing greater than 100,000 colony forming units of Escherichia coli, susceptibility testing in progress -She complains of chills, will check blood cultures  3.  Deconditioning -Patient having generalized weakness, was assisted out of bed to chair -Consulted physical therapy and occupational therapy. Occupational therapy recommending SNF placement -Social work consult for transition to SNF  3.  History of hypertension. -Blood pressure stable with last blood pressure reading of 124/62. -Currently off of antihypertensive agents  Code Status: Full code Family Communication: Spoke with her daughter present at bedside Disposition Plan: Pending physical therapy consultation, social work consult for asthma placement   Antibiotics:  Ceftriaxone  HPI/Subjective: Patient is a pleasant 73 year old female with a past medical history of insulin dependent diabetes mellitus, history of DKA, admitted to the medicine service on 08/09/2014. He presented with elevated blood sugar of 608, with lab show an anion gap of 18. She was also found to have urinary tract  infection and started on ceftriaxone therapy. Patient was admitted to telemetry where she was started on IV insulin and DKA protocol.  On 08/10/2014 he complained of chills, remains mildly lethargic, was assisted out of bed to chair. Remains weak and having unsteady gait. Blood sugars dropping to 43 early this morning, corrected, with blood sugars increasing into the 400s  Objective: Filed Vitals:   08/10/14 1326  BP: 139/67  Pulse: 83  Temp: 97.6 F (36.4 C)  Resp: 17    Intake/Output Summary (Last 24 hours) at 08/10/14 1724 Last data filed at 08/10/14 1358  Gross per 24 hour  Intake    360 ml  Output      0 ml  Net    360 ml   Filed Weights   08/09/14 0405 08/09/14 0535  Weight: 81.194 kg (179 lb) 80.967 kg (178 lb 8 oz)    Exam:   General:  patient continues to be somewhat lethargic however is arousable and can follow commands  Cardiovascular: regular rate and rhythm normal S1-S2  Respiratory: clear to auscultation bilaterally   Abdomen: soft nontender nondistended   Musculoskeletal: no edema  Data Reviewed: Basic Metabolic Panel:  Recent Labs Lab 08/09/14 1014 08/09/14 1148 08/09/14 1400 08/09/14 1655 08/10/14 0439  NA 141 141 140 140 143  K 4.1 4.3 4.4 4.3 3.6*  CL 104 105 104 103 105  CO2 26 24 24 26 27   GLUCOSE 255* 173* 122* 134* 43*  BUN 18 17 15 14 10   CREATININE 0.80 0.77 0.70 0.69 0.61  CALCIUM 8.8 8.7 8.6 8.6 9.4   Liver Function Tests:  Recent Labs Lab 08/09/14 0146  AST 42*  ALT 33  ALKPHOS 83  BILITOT 0.7  PROT 6.3  ALBUMIN 3.0*   No results found for this  basename: LIPASE, AMYLASE,  in the last 168 hours No results found for this basename: AMMONIA,  in the last 168 hours CBC:  Recent Labs Lab 08/09/14 0146 08/09/14 0906 08/10/14 0439  WBC 6.6 5.2 9.0  HGB 13.0 12.0 14.0  HCT 39.8 35.6* 41.2  MCV 92.3 92.0 89.0  PLT 139* 136* 165   Cardiac Enzymes: No results found for this basename: CKTOTAL, CKMB, CKMBINDEX,  TROPONINI,  in the last 168 hours BNP (last 3 results) No results found for this basename: PROBNP,  in the last 8760 hours CBG:  Recent Labs Lab 08/10/14 0546 08/10/14 0606 08/10/14 0729 08/10/14 1043 08/10/14 1133  GLUCAP 42* 174* 120* 275* 421*    Recent Results (from the past 240 hour(s))  URINE CULTURE     Status: None   Collection Time    08/09/14  2:10 AM      Result Value Ref Range Status   Specimen Description URINE, RANDOM   Final   Special Requests NONE   Final   Culture  Setup Time     Final   Value: 08/09/2014 10:40     Performed at [place]     Final   Value: >=100,000 COLONIES/ML     Performed at [facility]   Culture     Final   Value: ESCHERICHIA COLI     Performed at [facility]   Report Status PENDING   Incomplete  MRSA PCR SCREENING     Status: None   Collection Time    08/09/14 11:48 AM      Result Value Ref Range Status   MRSA by PCR NEGATIVE  NEGATIVE Final   Comment:            The GeneXpert MRSA Assay (FDA     approved for NASAL specimens     only), is one component of a     comprehensive MRSA colonization     surveillance program. It is not     intended to diagnose MRSA     infection nor to guide or     monitor treatment for     MRSA infections.     Studies: No results found.  Scheduled Meds: . cefTRIAXone (ROCEPHIN)  IV  2 g Intravenous Q24H  . enoxaparin (LOVENOX) injection  40 mg Subcutaneous Q24H  . insulin aspart  0-15 Units Subcutaneous TID WC  . insulin aspart  0-5 Units Subcutaneous QHS  . insulin detemir  40 Units Subcutaneous QHS  . latanoprost  1 drop Both Eyes QHS  . saccharomyces boulardii  500 mg Oral Daily  . sodium chloride  3 mL Intravenous Q12H   Continuous Infusions: . dextrose 5 % and 0.45% NaCl    . dextrose 5 % and 0.45% NaCl 75 mL/hr at 08/10/14 0610  . dextrose 5 % and 0.45% NaCl 100 mL/hr at 08/10/14 7341    Principal Problem:   DKA (diabetic  ketoacidoses) Active Problems:   Hypothyroidism   Pure hypercholesterolemia   HTN (hypertension)   Chronic diastolic CHF (congestive heart failure)   Dementia   UTI (urinary tract infection)    Time spent:     [name]  [facility] Pager [phone]. If 7PM-7AM, please contact night-coverage at [email or web address], password [facility] 08/10/2014, 5:24 PM  LOS: 1 day

## 2014-08-10 NOTE — Progress Notes (Signed)
CRITICAL VALUE ALERT  Critical value received:  CBG=43  Date of notification:  08/10/14  Time of notification: 1164H  Critical value read back:Yes.  yes  Nurse who received alert:  Terisa Starr  MD notified (1st page):  Lamar Blinks, NP  Time of first page:  0600H  MD notified (2nd page):  Time of second page:  Responding MD:  Lamar Blinks, NP  Time MD responded:  [phone]

## 2014-08-10 NOTE — Progress Notes (Signed)
At 2233H CBG=64.  Pt. Alert, verbal and oriented.  Gave 253mL orange juice with 1 small packet of sugar.  Will recheck CBG after 30 minutes.

## 2014-08-11 DIAGNOSIS — E1159 Type 2 diabetes mellitus with other circulatory complications: Secondary | ICD-10-CM

## 2014-08-11 DIAGNOSIS — N3000 Acute cystitis without hematuria: Secondary | ICD-10-CM

## 2014-08-11 LAB — CBC
HCT: 41.3 % (ref 36.0–46.0)
Hemoglobin: 13.8 g/dL (ref 12.0–15.0)
MCH: 30.7 pg (ref 26.0–34.0)
MCHC: 33.4 g/dL (ref 30.0–36.0)
MCV: 92 fL (ref 78.0–100.0)
Platelets: 134 10*3/uL — ABNORMAL LOW (ref 150–400)
RBC: 4.49 MIL/uL (ref 3.87–5.11)
RDW: 13.4 % (ref 11.5–15.5)
WBC: 5.6 10*3/uL (ref 4.0–10.5)

## 2014-08-11 LAB — BASIC METABOLIC PANEL
ANION GAP: 12 (ref 5–15)
BUN: 6 mg/dL (ref 6–23)
CHLORIDE: 103 meq/L (ref 96–112)
CO2: 26 mEq/L (ref 19–32)
Calcium: 8.5 mg/dL (ref 8.4–10.5)
Creatinine, Ser: 0.68 mg/dL (ref 0.50–1.10)
GFR calc non Af Amer: 86 mL/min — ABNORMAL LOW (ref >90)
Glucose, Bld: 54 mg/dL — ABNORMAL LOW (ref 70–99)
POTASSIUM: 3.7 meq/L (ref 3.7–5.3)
Sodium: 141 mEq/L (ref 137–147)

## 2014-08-11 LAB — URINE CULTURE: Colony Count: >=100000

## 2014-08-11 LAB — GLUCOSE, CAPILLARY
GLUCOSE-CAPILLARY: 143 mg/dL — AB (ref 70–99)
GLUCOSE-CAPILLARY: 165 mg/dL — AB (ref 70–99)
GLUCOSE-CAPILLARY: 202 mg/dL — AB (ref 70–99)
GLUCOSE-CAPILLARY: 48 mg/dL — AB (ref 70–99)
Glucose-Capillary: 28 mg/dL — CL (ref 70–99)
Glucose-Capillary: 185 mg/dL — ABNORMAL HIGH (ref 70–99)
Glucose-Capillary: 152 mg/dL — ABNORMAL HIGH (ref 70–99)
Glucose-Capillary: 201 mg/dL — ABNORMAL HIGH (ref 70–99)
Glucose-Capillary: 305 mg/dL — ABNORMAL HIGH (ref 70–99)

## 2014-08-11 MED ORDER — DEXTROSE 50 % IV SOLN
50.0000 mL | Freq: Once | INTRAVENOUS | Status: AC | PRN
  Administered 2014-08-11: 50 mL via INTRAVENOUS

## 2014-08-11 MED ORDER — DEXTROSE 50 % IV SOLN
INTRAVENOUS | Status: AC
  Administered 2014-08-11: 50 mL via INTRAVENOUS
  Filled 2014-08-11: qty 50

## 2014-08-11 MED ORDER — INSULIN DETEMIR 100 UNIT/ML ~~LOC~~ SOLN: 35.0000 [IU] | Freq: Every day | SUBCUTANEOUS | Status: DC

## 2014-08-11 MED ORDER — DEXTROSE 50 % IV SOLN
INTRAVENOUS | Status: AC
  Filled 2014-08-11: qty 50

## 2014-08-11 MED ORDER — AMLODIPINE BESYLATE 5 MG PO TABS
5.0000 mg | ORAL_TABLET | Freq: Every day | ORAL | Status: DC
  Administered 2014-08-11 – 2014-08-13 (×3): 5 mg via ORAL
  Filled 2014-08-11 (×3): qty 1

## 2014-08-11 MED ORDER — INSULIN ASPART 100 UNIT/ML ~~LOC~~ SOLN
4.0000 [IU] | Freq: Three times a day (TID) | SUBCUTANEOUS | Status: DC
  Administered 2014-08-11 – 2014-08-13 (×6): 4 [IU] via SUBCUTANEOUS

## 2014-08-11 MED ORDER — INSULIN ASPART 100 UNIT/ML ~~LOC~~ SOLN
0.0000 [IU] | SUBCUTANEOUS | Status: DC
  Administered 2014-08-11: 11 [IU] via SUBCUTANEOUS
  Administered 2014-08-12: 8 [IU] via SUBCUTANEOUS
  Administered 2014-08-12 (×2): 5 [IU] via SUBCUTANEOUS
  Administered 2014-08-12: 11 [IU] via SUBCUTANEOUS
  Administered 2014-08-12: 3 [IU] via SUBCUTANEOUS
  Administered 2014-08-13: 15 [IU] via SUBCUTANEOUS
  Administered 2014-08-13: 3 [IU] via SUBCUTANEOUS
  Administered 2014-08-13: 8 [IU] via SUBCUTANEOUS

## 2014-08-11 NOTE — Evaluation (Signed)
Physical Therapy Evaluation Patient Details Name: Regina [name] MRN: [id number] DOB: Mar 18, 1941 Today's Date: 08/11/2014   History of Present Illness  Critically high glucose levels. PMHx: Type II DM, Diabetic Retinopathy with Legal Blindness, HTN, AS,  Hypothyroid, Hyperlipidemia, and history of Vulvar Cancer Surgical Resection   Clinical Impression  Pt admitted with above. Pt currently with functional limitations due to the deficits listed below (see PT Problem List).  Pt will benefit from skilled PT to increase their independence and safety with mobility to allow discharge to the venue listed below.       Follow Up Recommendations SNF;Supervision/Assistance - 24 hour    Equipment Recommendations  None recommended by PT    Recommendations for Other Services       Precautions / Restrictions Precautions Precautions: Fall      Mobility  Bed Mobility Overal bed mobility: Needs Assistance Bed Mobility: Supine to Sit     Supine to sit: Mod assist     General bed mobility comments: Max A to scoot to EOB with use of bed pad  Transfers Overall transfer level: Needs assistance Equipment used: None Transfers: Stand Pivot Transfers Sit to Stand: Min assist Stand pivot transfers: Min assist       General transfer comment: physical assist to power up for stance, verbal cues for sequencing with transfer  Ambulation/Gait                Stairs            Wheelchair Mobility    Modified Rankin (Stroke Patients Only)       Balance                                             Pertinent Vitals/Pain Pain Assessment: No/denies pain    Home Living Family/patient expects to be discharged to:: Skilled nursing facility                      Prior Function Level of Independence: Needs assistance   Gait / Transfers Assistance Needed: self propels in w/c as primary mobility; ambulates with cane and +1 hand held assist short  distances  ADL's / Homemaking Assistance Needed: Says she can do her grooming and eating, but has A for B/D--verified by niece        Hand Dominance   Dominant Hand: Right    Extremity/Trunk Assessment   Upper Extremity Assessment: Overall WFL for tasks assessed           Lower Extremity Assessment: Generalized weakness         Communication   Communication: No difficulties  Cognition Arousal/Alertness: Awake/alert Behavior During Therapy: WFL for tasks assessed/performed Overall Cognitive Status: History of cognitive impairments - at baseline       Memory: Decreased short-term memory;Decreased recall of precautions              General Comments      Exercises        Assessment/Plan    PT Assessment Patient needs continued PT services  PT Diagnosis Difficulty walking;Generalized weakness   PT Problem List Decreased strength;Decreased activity tolerance;Decreased balance;Decreased mobility;Decreased safety awareness;Decreased cognition  PT Treatment Interventions DME instruction;Gait training;Functional mobility training;Therapeutic activities;Therapeutic exercise;Patient/family education;Wheelchair mobility training;Balance training   PT Goals (Current goals can be found in the Care Plan section) Acute Rehab PT Goals Patient Stated  Goal: not stated PT Goal Formulation: With patient/family Time For Goal Achievement: 08/25/14 Potential to Achieve Goals: Good    Frequency Min 2X/week   Barriers to discharge        Co-evaluation               End of Session Equipment Utilized During Treatment: Gait belt Activity Tolerance: Patient tolerated treatment well Patient left: in chair;with call bell/phone within reach;with family/visitor present Nurse Communication: Mobility status         Time: 0973-5329 PT Time Calculation (min): 17 min   Charges:   PT Evaluation $Initial PT Evaluation Tier I: 1 Procedure PT Treatments $Therapeutic  Activity: 8-22 mins   PT G Codes:          [name] 08/11/2014, 11:54 AM

## 2014-08-11 NOTE — Progress Notes (Signed)
CRITICAL VALUE ALERT  Critical value received:  CBG 48  Date of notification:  08/11/2014  Time of notification: 0740  Critical value read back:Yes  Nurse who received alert:  Hulan Amato, RN  MD notified (1st page):  Coralyn Pear  Time of first page:  [phone]  MD notified (2nd page):  Time of second page:  Responding MD:  Coralyn Pear  Time MD responded:  [phone]

## 2014-08-11 NOTE — Progress Notes (Signed)
[facility] PROGRESS NOTE  Regina [name] MPN:[id number] DOB: Nov 26, 1940 DOA: 08/09/2014 PCP: Delia Chimes, NP  Assessment/Plan: 1. Diabetic ketoacidosis -I suspect precipitated by underlying infectious process as UA consistent with UTI -She had been placed on the DKA protocol with her anion gap closing on 08/09/2014 -Discontinued IV insulin, placed her back on her basal insulin and advance diet. -Resolved -She had episode of hypoglycemia this morning with BS of 28, was given insulin. She was not administered her evening dose of levemir yesterday. I have discontinued levemir for now, placing her on meal time coverage only. Her blood sugars have remained stable over the day  2.  Urinary tract infection. -She has a history of recurrent urinary tract infections with prior urine cultures growing Escherichia coli, organism susceptible to beta lactams. -Will continue ceftriaxone 1 g IV every 24 hours, urine cultures growing greater than 100,000 colony forming units of Escherichia coli, susceptibility testing in progress  3.  Deconditioning -Patient having generalized weakness, was assisted out of bed to chair -Consulted physical therapy and occupational therapy. Occupational therapy recommending SNF placement -Social work consult for transition to SNF  3.  History of hypertension. -Blood pressures elevated, will restart Amlodipine 5 mg PO q daily  Code Status: Full code Family Communication: Spoke with her daughter present at bedside Disposition Plan: Likely discharge to SNF when stable   Antibiotics:  Ceftriaxone  HPI/Subjective: Patient is a pleasant 73 year old female with a past medical history of insulin dependent diabetes mellitus, history of DKA, admitted to the medicine service on 08/09/2014. He presented with elevated blood sugar of 608, with lab show an anion gap of 18. She was also found to have urinary tract infection and started on ceftriaxone therapy. Patient was  admitted to telemetry where she was started on IV insulin and DKA protocol.  On 08/11/2014 patient reporting feeling a little better, she was assisted out of bed to chair. An episode of hypoglycemia early in the morning, now improved  Objective: Filed Vitals:   08/11/14 0550  BP: 168/86  Pulse: 84  Temp: 97.8 F (36.6 C)  Resp: 16    Intake/Output Summary (Last 24 hours) at 08/11/14 1614 Last data filed at 08/11/14 1419  Gross per 24 hour  Intake 2107.75 ml  Output    100 ml  Net 2007.75 ml   Filed Weights   08/09/14 0405 08/09/14 0535  Weight: 81.194 kg (179 lb) 80.967 kg (178 lb 8 oz)    Exam:   General:  Patient today seems a little more awake, alert, following commands  Cardiovascular: regular rate and rhythm normal S1-S2  Respiratory: clear to auscultation bilaterally   Abdomen: soft nontender nondistended   Musculoskeletal: no edema  Data Reviewed: Basic Metabolic Panel:  Recent Labs Lab 08/09/14 1148 08/09/14 1400 08/09/14 1655 08/10/14 0439 08/11/14 0630  NA 141 140 140 143 141  K 4.3 4.4 4.3 3.6* 3.7  CL 105 104 103 105 103  CO2 24 24 26 27 26   GLUCOSE 173* 122* 134* 43* 54*  BUN 17 15 14 10 6   CREATININE 0.77 0.70 0.69 0.61 0.68  CALCIUM 8.7 8.6 8.6 9.4 8.5   Liver Function Tests:  Recent Labs Lab 08/09/14 0146  AST 42*  ALT 33  ALKPHOS 83  BILITOT 0.7  PROT 6.3  ALBUMIN 3.0*   No results found for this basename: LIPASE, AMYLASE,  in the last 168 hours No results found for this basename: AMMONIA,  in the last 168 hours CBC:  Recent  Labs Lab 08/09/14 0146 08/09/14 0906 08/10/14 0439 08/11/14 0630  WBC 6.6 5.2 9.0 5.6  HGB 13.0 12.0 14.0 13.8  HCT 39.8 35.6* 41.2 41.3  MCV 92.3 92.0 89.0 92.0  PLT 139* 136* 165 134*   Cardiac Enzymes: No results found for this basename: CKTOTAL, CKMB, CKMBINDEX, TROPONINI,  in the last 168 hours BNP (last 3 results) No results found for this basename: PROBNP,  in the last 8760  hours CBG:  Recent Labs Lab 08/11/14 0408 08/11/14 0740 08/11/14 0759 08/11/14 0917 08/11/14 1135  GLUCAP 143* 48* 185* 152* 201*    Recent Results (from the past 240 hour(s))  URINE CULTURE     Status: None   Collection Time    08/09/14  2:10 AM      Result Value Ref Range Status   Specimen Description URINE, RANDOM   Final   Special Requests NONE   Final   Culture  Setup Time     Final   Value: 08/09/2014 10:40     Performed at [place]     Final   Value: >=100,000 COLONIES/ML     Performed at [facility]   Culture     Final   Value: ESCHERICHIA COLI     Performed at [facility]   Report Status PENDING   Incomplete  MRSA PCR SCREENING     Status: None   Collection Time    08/09/14 11:48 AM      Result Value Ref Range Status   MRSA by PCR NEGATIVE  NEGATIVE Final   Comment:            The GeneXpert MRSA Assay (FDA     approved for NASAL specimens     only), is one component of a     comprehensive MRSA colonization     surveillance program. It is not     intended to diagnose MRSA     infection nor to guide or     monitor treatment for     MRSA infections.  CULTURE, BLOOD (ROUTINE X 2)     Status: None   Collection Time    08/10/14  7:06 PM      Result Value Ref Range Status   Specimen Description BLOOD RIGHT ANTECUBITAL   Final   Special Requests BOTTLES DRAWN AEROBIC AND ANAEROBIC 10CC   Final   Culture  Setup Time     Final   Value: 08/10/2014 22:34     Performed at [facility]   Culture     Final   Value:        BLOOD CULTURE RECEIVED NO GROWTH TO DATE CULTURE WILL BE HELD FOR 5 DAYS BEFORE ISSUING A FINAL NEGATIVE REPORT     Performed at [facility]   Report Status PENDING   Incomplete  CULTURE, BLOOD (ROUTINE X 2)     Status: None   Collection Time    08/10/14  7:12 PM      Result Value Ref Range Status   Specimen Description BLOOD RIGHT ARM   Final   Special Requests BOTTLES DRAWN  AEROBIC AND ANAEROBIC 5CC   Final   Culture  Setup Time     Final   Value: 08/10/2014 22:36     Performed at [facility]   Culture     Final   Value:        BLOOD CULTURE RECEIVED NO GROWTH TO DATE  CULTURE WILL BE HELD FOR 5 DAYS BEFORE ISSUING A FINAL NEGATIVE REPORT     Performed at [facility]   Report Status PENDING   Incomplete     Studies: No results found.  Scheduled Meds: . cefTRIAXone (ROCEPHIN)  IV  2 g Intravenous Q24H  . enoxaparin (LOVENOX) injection  40 mg Subcutaneous Q24H  . insulin aspart  0-15 Units Subcutaneous TID WC  . insulin aspart  0-5 Units Subcutaneous QHS  . insulin aspart  4 Units Subcutaneous TID WC  . latanoprost  1 drop Both Eyes QHS  . saccharomyces boulardii  500 mg Oral Daily  . sodium chloride  3 mL Intravenous Q12H   Continuous Infusions: . sodium chloride 75 mL/hr at 08/11/14 0601    Principal Problem:   DKA (diabetic ketoacidoses) Active Problems:   Hypothyroidism   Pure hypercholesterolemia   HTN (hypertension)   Chronic diastolic CHF (congestive heart failure)   Dementia   UTI (urinary tract infection)    Time spent: 35 min    [name]  [facility] Pager [phone]. If 7PM-7AM, please contact night-coverage at [email or web address], password [facility] 08/11/2014, 4:14 PM  LOS: 2 days

## 2014-08-11 NOTE — Progress Notes (Signed)
Made rounds on 6N32 and noticed patient sweating. Check CBG=28 at 0341H.  Initiated hypoglycemia protoccol and administered 50% Dextrose Inj 25 gms (0.5g/mL).  Night duty MD aware Amion. Recheck at 0408H, CBG=143.  Patient alert,oriented x3, verbal and stated, "I'm ok now".  CNA did bed bath to patient.  Will continue monitoring patient

## 2014-08-11 NOTE — Progress Notes (Signed)
Noted redness in bilateral skin folds groin areas.  Interdry applied to areas and family informed.

## 2014-08-12 DIAGNOSIS — E0810 Diabetes mellitus due to underlying condition with ketoacidosis without coma: Secondary | ICD-10-CM

## 2014-08-12 DIAGNOSIS — F0390 Unspecified dementia without behavioral disturbance: Secondary | ICD-10-CM

## 2014-08-12 LAB — GLUCOSE, CAPILLARY
GLUCOSE-CAPILLARY: 326 mg/dL — AB (ref 70–99)
GLUCOSE-CAPILLARY: 218 mg/dL — AB (ref 70–99)
Glucose-Capillary: 258 mg/dL — ABNORMAL HIGH (ref 70–99)
Glucose-Capillary: 171 mg/dL — ABNORMAL HIGH (ref 70–99)

## 2014-08-12 MED ORDER — CEFUROXIME AXETIL 250 MG PO TABS
250.0000 mg | ORAL_TABLET | Freq: Two times a day (BID) | ORAL | Status: DC
  Administered 2014-08-12 – 2014-08-13 (×2): 250 mg via ORAL
  Filled 2014-08-12 (×4): qty 1

## 2014-08-12 NOTE — Progress Notes (Signed)
0400 CBG 129, did not cover pt. per her request,did not want to feel bad with a lower blood sugar.

## 2014-08-12 NOTE — Progress Notes (Signed)
[facility] PROGRESS NOTE  Regina [name] DUK:[id number] DOB: [birth year] DOA: 08/09/2014 PCP: Delia Chimes, NP  Assessment/Plan: 1. Diabetic ketoacidosis -I suspect precipitated by underlying infectious process as UA consistent with UTI -She had been placed on the DKA protocol with her anion gap closing on 08/09/2014 -Discontinued IV insulin, placed her back on her basal insulin and advance diet. -Resolved -She had episode of hypoglycemia this morning with BS of 28, was given insulin. She was not administered her evening dose of levemir yesterday. I have discontinued levemir for now, placing her on meal time coverage only. Her blood sugars have remained stable over the day -Patient's blood sugars remained stable with mealtime coverage only  2.  Urinary tract infection. -She has a history of recurrent urinary tract infections with prior urine cultures growing Escherichia coli, organism susceptible to beta lactams. -Urine cultures on this hospitalization groin Escherichia coli, organism susceptible to ceftriaxone, resistant to ampicillin ciprofloxacin Levaquin Bactrim -Will transition to Ceftin  3.  Deconditioning -Consulted physical therapy and occupational therapy. Occupational therapy recommending SNF placement -Social work consult for transition to SNF  3.  History of hypertension. -Blood pressures improving since adding amlodipine 5 mg by mouth daily  Code Status: Full code Family Communication: Spoke with her daughter present at bedside Disposition Plan: Likely discharge to SNF when stable   Antibiotics:  Ceftriaxone  HPI/Subjective: Patient is a pleasant 73 year old female with a past medical history of insulin dependent diabetes mellitus, history of DKA, admitted to the medicine service on 08/09/2014. He presented with elevated blood sugar of 608, with lab show an anion gap of 18. She was also found to have urinary tract infection and started on ceftriaxone therapy.  Patient was admitted to telemetry where she was started on IV insulin and DKA protocol.  On 08/12/2014 patient's showing daily improvement, she is out of bed to chair.  Tolerating by mouth, did not have an episode of hypoglycemia this morning  Objective: Filed Vitals:   08/12/14 0952  BP: 138/68  Pulse: 77  Temp: 98.6 F (37 C)  Resp: 16    Intake/Output Summary (Last 24 hours) at 08/12/14 1401 Last data filed at 08/12/14 0959  Gross per 24 hour  Intake   1054 ml  Output    100 ml  Net    954 ml   Filed Weights   08/09/14 0405 08/09/14 0535  Weight: 81.194 kg (179 lb) 80.967 kg (178 lb 8 oz)    Exam:   General:  Patient today seems a little more awake, alert, following commands  Cardiovascular: regular rate and rhythm normal S1-S2  Respiratory: clear to auscultation bilaterally   Abdomen: soft nontender nondistended   Musculoskeletal: no edema  Data Reviewed: Basic Metabolic Panel:  Recent Labs Lab 08/09/14 1148 08/09/14 1400 08/09/14 1655 08/10/14 0439 08/11/14 0630  NA 141 140 140 143 141  K 4.3 4.4 4.3 3.6* 3.7  CL 105 104 103 105 103  CO2 24 24 26 27 26   GLUCOSE 173* 122* 134* 43* 54*  BUN 17 15 14 10 6   CREATININE 0.77 0.70 0.69 0.61 0.68  CALCIUM 8.7 8.6 8.6 9.4 8.5   Liver Function Tests:  Recent Labs Lab 08/09/14 0146  AST 42*  ALT 33  ALKPHOS 83  BILITOT 0.7  PROT 6.3  ALBUMIN 3.0*   No results for input(s): LIPASE, AMYLASE in the last 168 hours. No results for input(s): AMMONIA in the last 168 hours. CBC:  Recent Labs Lab 08/09/14 0146 08/09/14 3762  08/10/14 0439 08/11/14 0630  WBC 6.6 5.2 9.0 5.6  HGB 13.0 12.0 14.0 13.8  HCT 39.8 35.6* 41.2 41.3  MCV 92.3 92.0 89.0 92.0  PLT 139* 136* 165 134*   Cardiac Enzymes: No results for input(s): CKTOTAL, CKMB, CKMBINDEX, TROPONINI in the last 168 hours. BNP (last 3 results) No results for input(s): PROBNP in the last 8760 hours. CBG:  Recent Labs Lab 08/11/14 1735  08/11/14 1928 08/11/14 2338 08/12/14 0810 08/12/14 1201  GLUCAP 165* 305* 202* 258* 326*    Recent Results (from the past 240 hour(s))  Urine culture     Status: None   Collection Time: 08/09/14  2:10 AM  Result Value Ref Range Status   Specimen Description URINE, RANDOM  Final   Special Requests NONE  Final   Culture  Setup Time   Final    08/09/2014 10:40 Performed at [place]   Final    >=100,000 COLONIES/ML Performed at [facility]   Culture   Final    ESCHERICHIA COLI Performed at [facility]   Report Status 08/11/2014 FINAL  Final   Organism ID, Bacteria ESCHERICHIA COLI  Final      Susceptibility   Escherichia coli - MIC*    AMPICILLIN >=32 RESISTANT Resistant     CEFAZOLIN <=4 SENSITIVE Sensitive     CEFTRIAXONE <=1 SENSITIVE Sensitive     CIPROFLOXACIN >=4 RESISTANT Resistant     GENTAMICIN <=1 SENSITIVE Sensitive     LEVOFLOXACIN >=8 RESISTANT Resistant     NITROFURANTOIN <=16 SENSITIVE Sensitive     TOBRAMYCIN <=1 SENSITIVE Sensitive     TRIMETH/SULFA >=320 RESISTANT Resistant     PIP/TAZO <=4 SENSITIVE Sensitive     * ESCHERICHIA COLI  MRSA PCR Screening     Status: None   Collection Time: 08/09/14 11:48 AM  Result Value Ref Range Status   MRSA by PCR NEGATIVE NEGATIVE Final    Comment:        The GeneXpert MRSA Assay (FDA approved for NASAL specimens only), is one component of a comprehensive MRSA colonization surveillance program. It is not intended to diagnose MRSA infection nor to guide or monitor treatment for MRSA infections.  Culture, blood (routine x 2)     Status: None (Preliminary result)   Collection Time: 08/10/14  7:06 PM  Result Value Ref Range Status   Specimen Description BLOOD RIGHT ANTECUBITAL  Final   Special Requests BOTTLES DRAWN AEROBIC AND ANAEROBIC 10CC  Final   Culture  Setup Time   Final    08/10/2014 22:34 Performed at [facility]   Culture   Final           BLOOD  CULTURE RECEIVED NO GROWTH TO DATE CULTURE WILL BE HELD FOR 5 DAYS BEFORE ISSUING A FINAL NEGATIVE REPORT Performed at [facility]   Report Status PENDING  Incomplete  Culture, blood (routine x 2)     Status: None (Preliminary result)   Collection Time: 08/10/14  7:12 PM  Result Value Ref Range Status   Specimen Description BLOOD RIGHT ARM  Final   Special Requests BOTTLES DRAWN AEROBIC AND ANAEROBIC 5CC  Final   Culture  Setup Time   Final    08/10/2014 22:36 Performed at [facility]   Culture   Final           BLOOD CULTURE RECEIVED NO GROWTH TO DATE CULTURE WILL BE HELD FOR 5 DAYS BEFORE ISSUING A  FINAL NEGATIVE REPORT Performed at [facility]   Report Status PENDING  Incomplete     Studies: No results found.  Scheduled Meds: . amLODipine  5 mg Oral Daily  . cefTRIAXone (ROCEPHIN)  IV  2 g Intravenous Q24H  . enoxaparin (LOVENOX) injection  40 mg Subcutaneous Q24H  . insulin aspart  0-15 Units Subcutaneous 6 times per day  . insulin aspart  4 Units Subcutaneous TID WC  . latanoprost  1 drop Both Eyes QHS  . saccharomyces boulardii  500 mg Oral Daily  . sodium chloride  3 mL Intravenous Q12H   Continuous Infusions: . sodium chloride 75 mL/hr at 08/12/14 0705    Principal Problem:   DKA (diabetic ketoacidoses) Active Problems:   Hypothyroidism   Pure hypercholesterolemia   HTN (hypertension)   Chronic diastolic CHF (congestive heart failure)   Dementia   UTI (urinary tract infection)    Time spent: 20 min    [name]  [facility] Pager [phone]. If 7PM-7AM, please contact night-coverage at [email or web address], password [facility] 08/12/2014, 2:01 PM  LOS: 3 days

## 2014-08-13 DIAGNOSIS — I5032 Chronic diastolic (congestive) heart failure: Secondary | ICD-10-CM

## 2014-08-13 DIAGNOSIS — E038 Other specified hypothyroidism: Secondary | ICD-10-CM

## 2014-08-13 LAB — BASIC METABOLIC PANEL
ANION GAP: 12 (ref 5–15)
BUN: 7 mg/dL (ref 6–23)
CALCIUM: 8.6 mg/dL (ref 8.4–10.5)
CO2: 27 mEq/L (ref 19–32)
Chloride: 101 mEq/L (ref 96–112)
Creatinine, Ser: 0.71 mg/dL (ref 0.50–1.10)
GFR calc non Af Amer: 85 mL/min — ABNORMAL LOW (ref >90)
Glucose, Bld: 340 mg/dL — ABNORMAL HIGH (ref 70–99)
Potassium: 4.1 mEq/L (ref 3.7–5.3)
Sodium: 140 mEq/L (ref 137–147)

## 2014-08-13 LAB — GLUCOSE, CAPILLARY
GLUCOSE-CAPILLARY: 139 mg/dL — AB (ref 70–99)
Glucose-Capillary: 186 mg/dL — ABNORMAL HIGH (ref 70–99)
Glucose-Capillary: 265 mg/dL — ABNORMAL HIGH (ref 70–99)
Glucose-Capillary: 527 mg/dL — ABNORMAL HIGH (ref 70–99)
Glucose-Capillary: 87 mg/dL (ref 70–99)

## 2014-08-13 MED ORDER — INSULIN DETEMIR 100 UNIT/ML ~~LOC~~ SOLN
20.0000 [IU] | Freq: Every day | SUBCUTANEOUS | Status: DC
  Administered 2014-08-13: 20 [IU] via SUBCUTANEOUS
  Filled 2014-08-13: qty 0.2

## 2014-08-13 MED ORDER — INSULIN ASPART 100 UNIT/ML ~~LOC~~ SOLN: 4.0000 [IU] | Freq: Three times a day (TID) | SUBCUTANEOUS | Status: DC

## 2014-08-13 MED ORDER — INSULIN DETEMIR 100 UNIT/ML ~~LOC~~ SOLN: 20.0000 [IU] | Freq: Every day | SUBCUTANEOUS | Status: DC

## 2014-08-13 MED ORDER — CEFUROXIME AXETIL 250 MG PO TABS: 250.0000 mg | ORAL_TABLET | Freq: Two times a day (BID) | ORAL | Status: DC

## 2014-08-13 MED ORDER — INSULIN ASPART 100 UNIT/ML ~~LOC~~ SOLN
0.0000 [IU] | SUBCUTANEOUS | Status: DC
Start: 2014-08-13 — End: 2014-10-09

## 2014-08-13 NOTE — Progress Notes (Addendum)
INITIAL NUTRITION ASSESSMENT  DOCUMENTATION CODES Per approved criteria  -Obesity Unspecified   INTERVENTION: -Magic Cup BID, per pt request  NUTRITION DIAGNOSIS: Inadequate oral intake related to variable intake as evidenced by diet hx, 23.6% wt loss x 1 year.   Goal: Pt will meet >90% of estimated nutritional needs  Monitor:  PO/ supplement intake, labs, weight changes, I/O's  Reason for Assessment: Low braden  73 y.o. female  Admitting Dx: DKA (diabetic ketoacidoses)  ASSESSMENT: Patient is a pleasant 73 year old female with a past medical history of insulin dependent diabetes mellitus, history of DKA, admitted to the medicine service on 08/09/2014. He presented with elevated blood sugar of 608, with lab show an anion gap of 18. She was also found to have urinary tract infection and started on ceftriaxone therapy. Patient was admitted to telemetry where she was started on IV insulin and DKA protocol.  Hx obtained from pt and pt niece at bedside, who is very involved in her care. She is a resident of [facility]. Pt forgetful at times and, per niece, is not a reliable historian They report that pt generally eats well, but has periods of poor appetite. Niece reports pt appetite has declined this admission due to nausea and vomiting. She shares that pt vomited all of her dinner last night.  Documented PO: 50-100%.  Niece confirms progressive wt loss over the past year. She reports that pt was following an endocrinologist and progressively started losing weight. Noted a 23.6% wt loss in the past year, per documented wt hx. Wt loss may be multifactorial. Niece agrees that pt has made more healthful food choices since being in the nursing home, but is also concerned about her variable PO intake, citing that she gets "moody" and sometimes does not eat as well as she should. Pt eats magic cups at SNF, but does not like Ensure or Glucerna supplements. Also suspect wt loss may be  related to uncontrolled diabetes.  Unable to perform nutrition-focused physical exam at this time, due to pt being bathed at time of visit.  Labs reviewed. Glucose: 340. BGS: 87-527.  Height: Ht Readings from Last 1 Encounters:  08/09/14 5\' 2"  (1.575 m)    Weight: Wt Readings from Last 1 Encounters:  08/09/14 178 lb 8 oz (80.967 kg)    Ideal Body Weight: 110#  % Ideal Body Weight: 162%  Wt Readings from Last 10 Encounters:  08/09/14 178 lb 8 oz (80.967 kg)  05/09/14 185 lb (83.915 kg)  04/09/14 187 lb (84.823 kg)  01/03/14 197 lb 6.4 oz (89.54 kg)  11/26/13 192 lb (87.091 kg)  10/11/13 201 lb 4.8 oz (91.309 kg)  09/29/13 201 lb 4.8 oz (91.309 kg)  07/18/13 233 lb 11 oz (106 kg)  06/08/13 229 lb 1.6 oz (103.919 kg)  06/05/13 233 lb 4 oz (105.8 kg)    Usual Body Weight: 230#  % Usual Body Weight: 77%  BMI:  Body mass index is 32.64 kg/(m^2). Obesity, class I  Estimated Nutritional Needs: Kcal: 1800-2000 Protein: 89-99 grams Fluid: 1.8-2.0 L  Skin: MSAD  Diet Order: Diet Carb Modified  EDUCATION NEEDS: -No education needs identified at this time   Intake/Output Summary (Last 24 hours) at 08/13/14 0935 Last data filed at 08/13/14 0926  Gross per 24 hour  Intake 3597.5 ml  Output      1 ml  Net 3596.5 ml    Last BM: 08/10/14  Labs:   Recent Labs Lab 08/10/14 0439 08/11/14 0630 08/13/14  0400  NA 143 141 140  K 3.6* 3.7 4.1  CL 105 103 101  CO2 27 26 27   BUN 10 6 7   CREATININE 0.61 0.68 0.71  CALCIUM 9.4 8.5 8.6  GLUCOSE 43* 54* 340*    CBG (last 3)   Recent Labs  08/13/14 0006 08/13/14 0357 08/13/14 0734  GLUCAP 87 527* 265*   Lab Results  Component Value Date   HGBA1C 9.9* 12/31/2013   Scheduled Meds: . amLODipine  5 mg Oral Daily  . cefUROXime  250 mg Oral BID WC  . enoxaparin (LOVENOX) injection  40 mg Subcutaneous Q24H  . insulin aspart  0-15 Units Subcutaneous 6 times per day  . insulin aspart  4 Units Subcutaneous TID WC   . latanoprost  1 drop Both Eyes QHS  . saccharomyces boulardii  500 mg Oral Daily  . sodium chloride  3 mL Intravenous Q12H    Continuous Infusions: . sodium chloride 75 mL/hr at 08/12/14 2114    Past Medical History  Diagnosis Date  . Hypertension   . Hyperlipidemia   . TIA (transient ischemic attack) 1990's    "mini strokes" (06/07/2013)  . Legally blind     "both eyes; some vision in the right' (06/07/2013)  . Heart murmur 1960's  . Hypothyroidism   . Type II diabetes mellitus   . Anemia   . GERD (gastroesophageal reflux disease)   . Arthritis     "left leg" (06/07/2013)  . Depression   . Vulva cancer   . Altered mental status     "the first time I noticed any problem was 2 d ago" (06/07/2013)  . Aortic stenosis     mod-severe    Past Surgical History  Procedure Laterality Date  . Femur fracture surgery Left 1999  . Cesarean section  1968  . Tubal ligation  1970's  . Vulva surgery  1990's    "cut a big chunk out for cancer" (06/07/2013)  . Transthoracic echocardiogram  02/2013    EF 55-60%, mod conc hypertrophy, grade 1 diastolic dysfunction; AV with mod stenosis; calcified MV annulus & mild MR; LA severely dilated; PA peak pressure 62mmHg    Lenox Ladouceur A. Jimmye Norman, RD, LDN Pager: [phone] After hours Pager: [phone]

## 2014-08-13 NOTE — Progress Notes (Signed)
Patient being discharged to [facility]. Transportation provided by [facility] scheduled for 1500. Patient ready for discharge. Family updated on patient status.

## 2014-08-13 NOTE — Progress Notes (Signed)
Physical Therapy Treatment Patient Details Name: Regina [name] MRN: [id number] DOB: 08/04/1941 Today's Date: 08/13/2014    History of Present Illness Critically high glucose levels. PMHx: Type II DM, Diabetic Retinopathy with Legal Blindness, HTN, AS,  Hypothyroid, Hyperlipidemia, and history of Vulvar Cancer Surgical Resection     PT Comments    Patient agreeable to OOB to recliner. Patient had just eaten lunch and upon sitting up, patient became nauseated and was positive for emesis. She stated this had happen severals times since she has been admitted. Patient was then still able to stand pivot to recliner with Min A. Did not want to attempt ambulation at this time. Continue to recommend SNF for ongoing Physical Therapy.     Follow Up Recommendations  SNF;Supervision/Assistance - 24 hour     Equipment Recommendations  None recommended by PT    Recommendations for Other Services       Precautions / Restrictions Precautions Precautions: Fall    Mobility  Bed Mobility Overal bed mobility: Needs Assistance Bed Mobility: Supine to Sit   Sidelying to sit: Mod assist       General bed mobility comments: Mod A to position trunk into upright sitting and ensure balance. Cues for technique. Patient utilizes rails  Transfers Overall transfer level: Needs assistance Equipment used: None Transfers: Stand Pivot Transfers Sit to Stand: Min assist Stand pivot transfers: Min assist       General transfer comment: physical assist to power up into standing and verbal cues for sequencing with transfer. patient with shuffling pivot steps   Ambulation/Gait                 Stairs            Wheelchair Mobility    Modified Rankin (Stroke Patients Only)       Balance                                    Cognition Arousal/Alertness: Awake/alert Behavior During Therapy: WFL for tasks assessed/performed Overall Cognitive Status: History of cognitive  impairments - at baseline                      Exercises      General Comments        Pertinent Vitals/Pain Pain Assessment: No/denies pain    Home Living                      Prior Function            PT Goals (current goals can now be found in the care plan section) Progress towards PT goals: Progressing toward goals    Frequency  Min 2X/week    PT Plan Current plan remains appropriate    Co-evaluation             End of Session   Activity Tolerance: Patient tolerated treatment well Patient left: in chair;with call bell/phone within reach     Time: 1420-1444 PT Time Calculation (min): 24 min  Charges:  $Therapeutic Activity: 23-37 mins                    G Codes:      Jacqualyn Posey 08/13/2014, 3:14 PM  08/13/2014 Jacqualyn Posey PTA [phone] pager [phone] office

## 2014-08-13 NOTE — Progress Notes (Addendum)
Patient in route to [facility] via [facility]. Report called and given to Seth Bake, LPN.

## 2014-08-13 NOTE — Clinical Social Work Note (Addendum)
[employer] SNF authorization: [id number]  Pt to be discharged to [facility]. Pt's daughter, Levonne Lapping, updated regarding discharge.  [place] SNF: [phone] Transportation: EMS ([address])  Lubertha Sayres, [place] ([phone]) Licensed Clinical Social Worker Orthopedics [phone]) and Surgical [phone])      [facility] SNF authorization has been started. CSW to updated once SNF authorization received.  Lubertha Sayres, [place] ([phone]) Licensed Clinical Social Worker Orthopedics [phone]) and Surgical [phone])

## 2014-08-13 NOTE — Discharge Summary (Signed)
Physician Discharge Summary  Regina [name] HFW:[id number] DOB: 1940-12-11 DOA: 08/09/2014  PCP: Delia Chimes, NP  Admit date: 08/09/2014 Discharge date: 08/13/2014  Time spent: 35 minutes   Recommendations for Outpatient Follow-up:  1. Please obtain Accu-Checks q AC and q HS, patient having a history of poorly controlled diabetes mellitus, admitted for diabetic ketoacidosis.  2. She has history of recurrent UTI's, being discharged on Ceftin 250 mg PO BID for 3 days, anticipated stop date Nov 5, please follow closely.    Discharge Diagnoses:  Principal Problem:   DKA (diabetic ketoacidoses) Active Problems:   Hypothyroidism   Pure hypercholesterolemia   HTN (hypertension)   Chronic diastolic CHF (congestive heart failure)   Dementia   UTI (urinary tract infection)   Discharge Condition: stable  Diet recommendation: Carb modified diet  Filed Weights   08/09/14 0405 08/09/14 0535  Weight: 81.194 kg (179 lb) 80.967 kg (178 lb 8 oz)    History of present illness:  Regina [name] is a 72 y.o. 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 Per the daughter who is at the bedside, her mother's glucose levels had been increasing over the past 3 days and had been in the 300's and 400's but today it was critically high. She was evaluated in the ED and was found to have a glucose level of 581, and was found to have DKA. She was placed on the DKA protocol and referred for medical admission  Hospital Course:  Patient is a pleasant 73 year old female with a past medical history of insulin dependent diabetes mellitus, history of DKA, admitted to the medicine service on 08/09/2014. He presented with elevated blood sugar of 608, with lab show an anion gap of 18. She was also found to have  urinary tract infection and started on ceftriaxone therapy. Patient was admitted to telemetry where she was started on IV insulin and DKA protocol.   Diabetic ketoacidosis -I suspect precipitated by underlying infectious process as UA consistent with UTI -She had been placed on the DKA protocol with her anion gap closing on 08/09/2014 -Discontinued IV insulin, placed her back on her basal insulin and advance diet. -Resolved -While on Levemir 47 units daily she had episode of hypoglycemia on the following morning. Her Levemir was decreased to 40 units, however had another episode of hypoglycemia. Her basal insulin was then held as she was given mealtime coverage only. She subsequently developed hyperglycemia. Will discharge her on Levemir 20 units [place] in AM with 4 units of Novolog meal time coverage. Please follow up on her blood sugars.  2. Urinary tract infection. -She has a history of recurrent urinary tract infections with prior urine cultures growing Escherichia coli, organism susceptible to beta lactams. -Urine cultures on this hospitalization groin Escherichia coli, organism susceptible to ceftriaxone, resistant to ampicillin ciprofloxacin Levaquin Bactrim -Will discharge on Ceftin 250 mg PO BID for 3 days, receiving a total of 7 days of antibiotic therapy  3. Deconditioning -Consulted physical therapy and occupational therapy. Occupational therapy recommending SNF placement -Social work consult for transition to SNF  3. History of hypertension. -Will discharge on Amlodipine 5 mg PO q daily and Coreg 25 mg PO BID   Discharge Exam: Filed Vitals:   08/13/14 0908  BP: 151/75  Pulse: 72  Temp: 98 F (36.7  C)  Resp: 18    General: Patient is awake and alert and is following commands, feels ready to be discharged today  Cardiovascular: regular rate and rhythm normal S1-S2, has 3/6 SEM  Respiratory: clear to auscultation bilaterally   Abdomen: soft nontender nondistended    Musculoskeletal: no edema  Discharge Instructions You were cared for by a hospitalist during your hospital stay. If you have any questions about your discharge medications or the care you received while you were in the hospital after you are discharged, you can call the unit and asked to speak with the hospitalist on call if the hospitalist that took care of you is not available. Once you are discharged, your primary care physician will handle any further medical issues. Please note that NO REFILLS for any discharge medications will be authorized once you are discharged, as it is imperative that you return to your primary care physician (or establish a relationship with a primary care physician if you do not have one) for your aftercare needs so that they can reassess your need for medications and monitor your lab values.  Discharge Instructions    Call MD for:  difficulty breathing, headache or visual disturbances    Complete by:  As directed      Call MD for:  extreme fatigue    Complete by:  As directed      Call MD for:  hives    Complete by:  As directed      Call MD for:  persistant dizziness or light-headedness    Complete by:  As directed      Call MD for:  persistant nausea and vomiting    Complete by:  As directed      Call MD for:  redness, tenderness, or signs of infection (pain, swelling, redness, odor or green/yellow discharge around incision site)    Complete by:  As directed      Call MD for:  severe uncontrolled pain    Complete by:  As directed      Call MD for:  temperature >100.4    Complete by:  As directed      Diet - low sodium heart healthy    Complete by:  As directed      Increase activity slowly    Complete by:  As directed           Current Discharge Medication List    START taking these medications   Details  cefUROXime (CEFTIN) 250 MG tablet Take 1 tablet (250 mg total) by mouth 2 (two) times daily with a meal. Qty: 6 tablet, Refills: 0    !!  insulin aspart (NOVOLOG) 100 UNIT/ML injection Inject 0-15 Units into the skin every 4 (four) hours. Qty: 10 mL, Refills: 11    !! insulin aspart (NOVOLOG) 100 UNIT/ML injection Inject 4 Units into the skin 3 (three) times daily with meals. Qty: 10 mL, Refills: 11     !! - Potential duplicate medications found. Please discuss with provider.    CONTINUE these medications which have CHANGED   Details  insulin detemir (LEVEMIR) 100 UNIT/ML injection Inject 0.2 mLs (20 Units total) into the skin daily. Qty: 10 mL, Refills: 11      CONTINUE these medications which have NOT CHANGED   Details  amLODipine (NORVASC) 5 MG tablet Take 1 tablet (5 mg total) by mouth daily.    atorvastatin (LIPITOR) 20 MG tablet Take 20 mg by mouth at bedtime.    calcium-vitamin  D (OSCAL WITH D) 500-200 MG-UNIT per tablet Take 1 tablet by mouth 2 (two) times daily.    carvedilol (COREG) 25 MG tablet Take 1 tablet (25 mg total) by mouth 2 (two) times daily with a meal. Qty: 60 tablet, Refills: 0    clobetasol cream (TEMOVATE) 1.51 % Apply 1 application topically 2 (two) times daily.    Cranberry-Vitamin C-Inulin (UTI-STAT) LIQD Take 30 mLs by mouth 2 (two) times daily.    dipyridamole-aspirin (AGGRENOX) 200-25 MG per 12 hr capsule Take 1 capsule by mouth 2 (two) times daily.    DULoxetine (CYMBALTA) 20 MG capsule Take 20 mg by mouth daily.    ezetimibe (ZETIA) 10 MG tablet Take 10 mg by mouth at bedtime.     ferrous fumarate (HEMOCYTE - 106 MG FE) 325 (106 FE) MG TABS tablet Take 2 tablets by mouth daily.     latanoprost (XALATAN) 0.005 % ophthalmic solution Place 1 drop into both eyes at bedtime.    levothyroxine (SYNTHROID, LEVOTHROID) 75 MCG tablet Take 1 tablet (75 mcg total) by mouth daily before breakfast. Qty: 30 tablet, Refills: 0    Memantine HCl ER (NAMENDA XR) 7 MG CP24 Take 7 mg by mouth at bedtime.     Multiple Vitamin (MULTIVITAMIN WITH MINERALS) TABS tablet Take 1 tablet by mouth daily.     Omega-3 Fatty Acids (FISH OIL) 1000 MG CAPS Take 2,000 mg by mouth daily.    potassium chloride SA (K-DUR,KLOR-CON) 20 MEQ tablet Take 20 mEq by mouth 2 (two) times daily.    promethazine (PHENERGAN) 12.5 MG tablet Take 12.5 mg by mouth every 6 (six) hours as needed for nausea or vomiting.    saccharomyces boulardii (FLORASTOR) 250 MG capsule Take 500 mg by mouth daily.      STOP taking these medications     insulin lispro (HUMALOG) 100 UNIT/ML injection        Allergies  Allergen Reactions  . Ciprofloxacin Hives  . Codeine Other (See Comments)    REACTION: nervous, shaky  . Hydrochlorothiazide W-Triamterene Other (See Comments)    REACTION: hyponatremia  . Metformin And Related Other (See Comments)    Un specified  . Ticlopidine Hcl Other (See Comments)    REACTION: bleeding  . Amoxicillin Rash   Follow-up Information    Follow up with [facility], NP In 1 week.   Specialty:  Nurse Practitioner   Contact information:   [facility] [place] [address] [phone]        The results of significant diagnostics from this hospitalization (including imaging, microbiology, ancillary and laboratory) are listed below for reference.    Significant Diagnostic Studies: No results found.  Microbiology: Recent Results (from the past 240 hour(s))  Urine culture     Status: None   Collection Time: 08/09/14  2:10 AM  Result Value Ref Range Status   Specimen Description URINE, RANDOM  Final   Special Requests NONE  Final   Culture  Setup Time   Final    08/09/2014 10:40 Performed at [facility]   Final    >=100,000 COLONIES/ML Performed at [place] Performed at [facility]   Report Status 08/11/2014 FINAL  Final   Organism ID, Bacteria ESCHERICHIA COLI  Final      Susceptibility   Escherichia coli - MIC*    AMPICILLIN >=32 RESISTANT Resistant     CEFAZOLIN <=4  SENSITIVE Sensitive     CEFTRIAXONE <=1 SENSITIVE Sensitive     CIPROFLOXACIN >=4 RESISTANT Resistant     GENTAMICIN <=1 SENSITIVE Sensitive     LEVOFLOXACIN >=8 RESISTANT Resistant     NITROFURANTOIN <=16 SENSITIVE Sensitive     TOBRAMYCIN <=1 SENSITIVE Sensitive     TRIMETH/SULFA >=320 RESISTANT Resistant     PIP/TAZO <=4 SENSITIVE Sensitive     * ESCHERICHIA COLI  MRSA PCR Screening     Status: None   Collection Time: 08/09/14 11:48 AM  Result Value Ref Range Status   MRSA by PCR NEGATIVE NEGATIVE Final    Comment:        The GeneXpert MRSA Assay (FDA approved for NASAL specimens only), is one component of a comprehensive MRSA colonization surveillance program. It is not intended to diagnose MRSA infection nor to guide or monitor treatment for MRSA infections.  Culture, blood (routine x 2)     Status: None (Preliminary result)   Collection Time: 08/10/14  7:06 PM  Result Value Ref Range Status   Specimen Description BLOOD RIGHT ANTECUBITAL  Final   Special Requests BOTTLES DRAWN AEROBIC AND ANAEROBIC 10CC  Final   Culture  Setup Time   Final    08/10/2014 22:34 Performed at [facility]   Culture   Final           BLOOD CULTURE RECEIVED NO GROWTH TO DATE CULTURE WILL BE HELD FOR 5 DAYS BEFORE ISSUING A FINAL NEGATIVE REPORT Performed at [facility]   Report Status PENDING  Incomplete  Culture, blood (routine x 2)     Status: None (Preliminary result)   Collection Time: 08/10/14  7:12 PM  Result Value Ref Range Status   Specimen Description BLOOD RIGHT ARM  Final   Special Requests BOTTLES DRAWN AEROBIC AND ANAEROBIC 5CC  Final   Culture  Setup Time   Final    08/10/2014 22:36 Performed at [facility]   Culture   Final           BLOOD CULTURE RECEIVED NO GROWTH TO DATE CULTURE WILL BE HELD FOR 5 DAYS BEFORE ISSUING A FINAL NEGATIVE REPORT Performed at [facility]   Report Status PENDING  Incomplete     Labs: Basic Metabolic  Panel:  Recent Labs Lab 08/09/14 1400 08/09/14 1655 08/10/14 0439 08/11/14 0630 08/13/14 0400  NA 140 140 143 141 140  K 4.4 4.3 3.6* 3.7 4.1  CL 104 103 105 103 101  CO2 24 26 27 26 27   GLUCOSE 122* 134* 43* 54* 340*  BUN 15 14 10 6 7   CREATININE 0.70 0.69 0.61 0.68 0.71  CALCIUM 8.6 8.6 9.4 8.5 8.6   Liver Function Tests:  Recent Labs Lab 08/09/14 0146  AST 42*  ALT 33  ALKPHOS 83  BILITOT 0.7  PROT 6.3  ALBUMIN 3.0*   No results for input(s): LIPASE, AMYLASE in the last 168 hours. No results for input(s): AMMONIA in the last 168 hours. CBC:  Recent Labs Lab 08/09/14 0146 08/09/14 0906 08/10/14 0439 08/11/14 0630  WBC 6.6 5.2 9.0 5.6  HGB 13.0 12.0 14.0 13.8  HCT 39.8 35.6* 41.2 41.3  MCV 92.3 92.0 89.0 92.0  PLT 139* 136* 165 134*   Cardiac Enzymes: No results for input(s): CKTOTAL, CKMB, CKMBINDEX, TROPONINI in the last 168 hours. BNP: BNP (last 3 results) No results for input(s): PROBNP in the last 8760 hours. CBG:  Recent Labs Lab 08/12/14 1605 08/12/14 2014 08/13/14  0006 08/13/14 0357 08/13/14 0734  GLUCAP 218* 171* 87 527* 265*       Signed:  [name]  [facility] 08/13/2014, 10:41 AM

## 2014-08-13 NOTE — Care Management Note (Signed)
  Page 1 of 1   08/13/2014     10:52:00 AM CARE MANAGEMENT NOTE 08/13/2014  Patient:  Regina [name], Regina [name]   Account Number:  [id number]  Date Initiated:  08/13/2014  Documentation initiated by:  Magdalen Spatz  Subjective/Objective Assessment:     Action/Plan:   Anticipated DC Date:     Anticipated DC Plan:           Choice offered to / List presented to:             Status of service:   Medicare Important Message given?  YES (If response is "NO", the following Medicare IM given date fields will be blank) Date Medicare IM given:  08/13/2014 Medicare IM given by:  Magdalen Spatz Date Additional Medicare IM given:   Additional Medicare IM given by:    Discharge Disposition:    Per UR Regulation:    If discussed at Long Length of Stay Meetings, dates discussed:    Comments:

## 2014-08-13 NOTE — Progress Notes (Signed)
CRITICAL VALUE ALERT  Critical value received:  537  Date of notification:  08/13/14  Time of notification:  0500  Critical value read back:y  Nurse who received alert: st  MD notified (1st page):  osman,pa  Time of first page:  0500  MD notified (2nd page):  Time of second page:  Responding MD:  Phillip Heal  Time MD responded:  [id number]

## 2014-08-16 LAB — CULTURE, BLOOD (ROUTINE X 2)
CULTURE: NO GROWTH
Culture: NO GROWTH

## 2014-10-06 ENCOUNTER — Emergency Department (HOSPITAL_COMMUNITY): Payer: Medicare Other

## 2014-10-06 ENCOUNTER — Encounter (HOSPITAL_COMMUNITY): Payer: Self-pay | Admitting: Emergency Medicine

## 2014-10-06 ENCOUNTER — Inpatient Hospital Stay (HOSPITAL_COMMUNITY)
Admission: EM | Admit: 2014-10-06 | Discharge: 2014-10-09 | DRG: 871 | Disposition: A | Discharge status: Skilled Nursing Facility | Payer: Medicare Other | Attending: Internal Medicine | Admitting: Internal Medicine

## 2014-10-06 DIAGNOSIS — G92 Toxic encephalopathy: Secondary | ICD-10-CM | POA: Diagnosis present

## 2014-10-06 DIAGNOSIS — E785 Hyperlipidemia, unspecified: Secondary | ICD-10-CM | POA: Diagnosis present

## 2014-10-06 DIAGNOSIS — Z885 Allergy status to narcotic agent status: Secondary | ICD-10-CM

## 2014-10-06 DIAGNOSIS — E860 Dehydration: Secondary | ICD-10-CM | POA: Diagnosis present

## 2014-10-06 DIAGNOSIS — R5383 Other fatigue: Secondary | ICD-10-CM | POA: Diagnosis not present

## 2014-10-06 DIAGNOSIS — M1990 Unspecified osteoarthritis, unspecified site: Secondary | ICD-10-CM | POA: Diagnosis present

## 2014-10-06 DIAGNOSIS — H548 Legal blindness, as defined in USA: Secondary | ICD-10-CM | POA: Diagnosis present

## 2014-10-06 DIAGNOSIS — Z87891 Personal history of nicotine dependence: Secondary | ICD-10-CM

## 2014-10-06 DIAGNOSIS — Z87440 Personal history of urinary (tract) infections: Secondary | ICD-10-CM

## 2014-10-06 DIAGNOSIS — D649 Anemia, unspecified: Secondary | ICD-10-CM | POA: Diagnosis present

## 2014-10-06 DIAGNOSIS — Z8673 Personal history of transient ischemic attack (TIA), and cerebral infarction without residual deficits: Secondary | ICD-10-CM

## 2014-10-06 DIAGNOSIS — Z515 Encounter for palliative care: Secondary | ICD-10-CM

## 2014-10-06 DIAGNOSIS — E875 Hyperkalemia: Secondary | ICD-10-CM | POA: Diagnosis present

## 2014-10-06 DIAGNOSIS — E039 Hypothyroidism, unspecified: Secondary | ICD-10-CM | POA: Diagnosis present

## 2014-10-06 DIAGNOSIS — E1165 Type 2 diabetes mellitus with hyperglycemia: Secondary | ICD-10-CM | POA: Diagnosis present

## 2014-10-06 DIAGNOSIS — F329 Major depressive disorder, single episode, unspecified: Secondary | ICD-10-CM | POA: Diagnosis present

## 2014-10-06 DIAGNOSIS — I5032 Chronic diastolic (congestive) heart failure: Secondary | ICD-10-CM | POA: Diagnosis present

## 2014-10-06 DIAGNOSIS — Z8589 Personal history of malignant neoplasm of other organs and systems: Secondary | ICD-10-CM | POA: Diagnosis not present

## 2014-10-06 DIAGNOSIS — F0390 Unspecified dementia without behavioral disturbance: Secondary | ICD-10-CM | POA: Diagnosis present

## 2014-10-06 DIAGNOSIS — N179 Acute kidney failure, unspecified: Secondary | ICD-10-CM | POA: Diagnosis present

## 2014-10-06 DIAGNOSIS — N390 Urinary tract infection, site not specified: Secondary | ICD-10-CM | POA: Diagnosis present

## 2014-10-06 DIAGNOSIS — E1310 Other specified diabetes mellitus with ketoacidosis without coma: Secondary | ICD-10-CM | POA: Diagnosis present

## 2014-10-06 DIAGNOSIS — IMO0002 Reserved for concepts with insufficient information to code with codable children: Secondary | ICD-10-CM | POA: Diagnosis present

## 2014-10-06 DIAGNOSIS — R4182 Altered mental status, unspecified: Secondary | ICD-10-CM | POA: Diagnosis present

## 2014-10-06 DIAGNOSIS — A419 Sepsis, unspecified organism: Secondary | ICD-10-CM | POA: Diagnosis present

## 2014-10-06 DIAGNOSIS — R404 Transient alteration of awareness: Secondary | ICD-10-CM

## 2014-10-06 DIAGNOSIS — K219 Gastro-esophageal reflux disease without esophagitis: Secondary | ICD-10-CM | POA: Diagnosis present

## 2014-10-06 DIAGNOSIS — R8299 Other abnormal findings in urine: Secondary | ICD-10-CM

## 2014-10-06 DIAGNOSIS — E1151 Type 2 diabetes mellitus with diabetic peripheral angiopathy without gangrene: Secondary | ICD-10-CM | POA: Diagnosis present

## 2014-10-06 DIAGNOSIS — Z881 Allergy status to other antibiotic agents status: Secondary | ICD-10-CM | POA: Diagnosis not present

## 2014-10-06 DIAGNOSIS — W19XXXA Unspecified fall, initial encounter: Secondary | ICD-10-CM

## 2014-10-06 DIAGNOSIS — I10 Essential (primary) hypertension: Secondary | ICD-10-CM | POA: Diagnosis present

## 2014-10-06 DIAGNOSIS — Z794 Long term (current) use of insulin: Secondary | ICD-10-CM | POA: Diagnosis not present

## 2014-10-06 DIAGNOSIS — Z66 Do not resuscitate: Secondary | ICD-10-CM | POA: Diagnosis present

## 2014-10-06 DIAGNOSIS — I350 Nonrheumatic aortic (valve) stenosis: Secondary | ICD-10-CM | POA: Diagnosis present

## 2014-10-06 DIAGNOSIS — G9340 Encephalopathy, unspecified: Secondary | ICD-10-CM | POA: Diagnosis present

## 2014-10-06 LAB — COMPREHENSIVE METABOLIC PANEL
ALK PHOS: 59 U/L (ref 39–117)
ALT: 55 U/L — AB (ref 0–35)
AST: 33 U/L (ref 0–37)
Albumin: 3 g/dL — ABNORMAL LOW (ref 3.5–5.2)
Anion gap: 7 (ref 5–15)
BUN: 26 mg/dL — AB (ref 6–23)
CO2: 22 mmol/L (ref 19–32)
Calcium: 9.2 mg/dL (ref 8.4–10.5)
Chloride: 110 mEq/L (ref 96–112)
Creatinine, Ser: 1.5 mg/dL — ABNORMAL HIGH (ref 0.50–1.10)
GFR calc Af Amer: 39 mL/min — ABNORMAL LOW (ref >90)
GFR calc non Af Amer: 34 mL/min — ABNORMAL LOW (ref >90)
Glucose, Bld: 80 mg/dL (ref 70–99)
POTASSIUM: 5.9 mmol/L — AB (ref 3.5–5.1)
SODIUM: 139 mmol/L (ref 135–145)
TOTAL PROTEIN: 5.6 g/dL — AB (ref 6.0–8.3)
Total Bilirubin: 0.6 mg/dL (ref 0.3–1.2)

## 2014-10-06 LAB — I-STAT CHEM 8, ED
BUN: 29 mg/dL — ABNORMAL HIGH (ref 6–23)
CREATININE: 1.5 mg/dL — AB (ref 0.50–1.10)
Calcium, Ion: 1.24 mmol/L (ref 1.13–1.30)
Chloride: 108 mEq/L (ref 96–112)
GLUCOSE: 77 mg/dL (ref 70–99)
HCT: 37 % (ref 36.0–46.0)
HEMOGLOBIN: 12.6 g/dL (ref 12.0–15.0)
Potassium: 5.8 mmol/L — ABNORMAL HIGH (ref 3.5–5.1)
Sodium: 139 mmol/L (ref 135–145)
TCO2: 21 mmol/L (ref 0–100)

## 2014-10-06 LAB — BRAIN NATRIURETIC PEPTIDE: B Natriuretic Peptide: 1193.4 pg/mL — ABNORMAL HIGH (ref 0.0–100.0)

## 2014-10-06 LAB — CBC WITH DIFFERENTIAL/PLATELET
Basophils Absolute: 0 10*3/uL (ref 0.0–0.1)
Basophils Relative: 1 % (ref 0–1)
Eosinophils Absolute: 0 10*3/uL (ref 0.0–0.7)
Eosinophils Relative: 0 % (ref 0–5)
HEMATOCRIT: 35.9 % — AB (ref 36.0–46.0)
HEMOGLOBIN: 12.2 g/dL (ref 12.0–15.0)
LYMPHS ABS: 2.8 10*3/uL (ref 0.7–4.0)
LYMPHS PCT: 39 % (ref 12–46)
MCH: 31.4 pg (ref 26.0–34.0)
MCHC: 34 g/dL (ref 30.0–36.0)
MCV: 92.3 fL (ref 78.0–100.0)
MONO ABS: 1 10*3/uL (ref 0.1–1.0)
MONOS PCT: 14 % — AB (ref 3–12)
NEUTROS ABS: 3.3 10*3/uL (ref 1.7–7.7)
Neutrophils Relative %: 46 % (ref 43–77)
Platelets: 172 10*3/uL (ref 150–400)
RBC: 3.89 MIL/uL (ref 3.87–5.11)
RDW: 14.4 % (ref 11.5–15.5)
WBC: 7 10*3/uL (ref 4.0–10.5)

## 2014-10-06 LAB — URINALYSIS, ROUTINE W REFLEX MICROSCOPIC
GLUCOSE, UA: NEGATIVE mg/dL
Ketones, ur: 15 mg/dL — AB
Nitrite: NEGATIVE
PH: 5 (ref 5.0–8.0)
Protein, ur: NEGATIVE mg/dL
SPECIFIC GRAVITY, URINE: 1.019 (ref 1.005–1.030)
Urobilinogen, UA: 0.2 mg/dL (ref 0.0–1.0)

## 2014-10-06 LAB — CBG MONITORING, ED
GLUCOSE-CAPILLARY: 156 mg/dL — AB (ref 70–99)
Glucose-Capillary: 69 mg/dL — ABNORMAL LOW (ref 70–99)
Glucose-Capillary: 175 mg/dL — ABNORMAL HIGH (ref 70–99)

## 2014-10-06 LAB — TROPONIN I: Troponin I: <0.03 ng/mL (ref <0.031)

## 2014-10-06 LAB — URINE MICROSCOPIC-ADD ON

## 2014-10-06 LAB — I-STAT CG4 LACTIC ACID, ED: LACTIC ACID, VENOUS: 0.7 mmol/L (ref 0.5–2.2)

## 2014-10-06 MED ORDER — ONDANSETRON HCL 4 MG/2ML IJ SOLN: 4.0000 mg | Freq: Four times a day (QID) | INTRAMUSCULAR | Status: DC | PRN

## 2014-10-06 MED ORDER — INSULIN ASPART 100 UNIT/ML ~~LOC~~ SOLN
10.0000 [IU] | Freq: Once | SUBCUTANEOUS | Status: AC
  Administered 2014-10-06: 10 [IU] via SUBCUTANEOUS
  Filled 2014-10-06: qty 1

## 2014-10-06 MED ORDER — LORAZEPAM 0.5 MG PO TABS: 0.5000 mg | ORAL_TABLET | ORAL | Status: DC | PRN

## 2014-10-06 MED ORDER — MORPHINE SULFATE 2 MG/ML IJ SOLN
1.0000 mg | INTRAMUSCULAR | Status: DC | PRN
  Administered 2014-10-07 – 2014-10-08 (×4): 1 mg via INTRAVENOUS
  Filled 2014-10-06 (×4): qty 1

## 2014-10-06 MED ORDER — ALUM & MAG HYDROXIDE-SIMETH 200-200-20 MG/5ML PO SUSP: 30.0000 mL | Freq: Four times a day (QID) | ORAL | Status: DC | PRN

## 2014-10-06 MED ORDER — DEXTROSE 50 % IV SOLN
1.0000 | Freq: Once | INTRAVENOUS | Status: AC
  Administered 2014-10-06: 50 mL via INTRAVENOUS
  Filled 2014-10-06: qty 50

## 2014-10-06 MED ORDER — DEXTROSE 5 % IV SOLN
1.0000 g | Freq: Once | INTRAVENOUS | Status: AC
  Administered 2014-10-06: 1 g via INTRAVENOUS
  Filled 2014-10-06: qty 10

## 2014-10-06 MED ORDER — ONDANSETRON HCL 4 MG PO TABS: 4.0000 mg | ORAL_TABLET | Freq: Four times a day (QID) | ORAL | Status: DC | PRN

## 2014-10-06 MED ORDER — SODIUM CHLORIDE 0.9 % IV SOLN: 1.0000 g | Freq: Once | INTRAVENOUS | Status: DC

## 2014-10-06 MED ORDER — ACETAMINOPHEN 650 MG RE SUPP
650.0000 mg | Freq: Four times a day (QID) | RECTAL | Status: DC | PRN
Start: 2014-10-06 — End: 2014-10-09

## 2014-10-06 MED ORDER — ACETAMINOPHEN 325 MG PO TABS: 650.0000 mg | ORAL_TABLET | Freq: Four times a day (QID) | ORAL | Status: DC | PRN

## 2014-10-06 MED ORDER — PROMETHAZINE HCL 25 MG PO TABS: 12.5000 mg | ORAL_TABLET | Freq: Four times a day (QID) | ORAL | Status: DC | PRN

## 2014-10-06 MED ORDER — SODIUM CHLORIDE 0.9 % IV BOLUS (SEPSIS)
1000.0000 mL | Freq: Once | INTRAVENOUS | Status: AC
  Administered 2014-10-06: 1000 mL via INTRAVENOUS

## 2014-10-06 MED ORDER — SODIUM CHLORIDE 0.9 % IV SOLN
1.0000 g | Freq: Once | INTRAVENOUS | Status: AC
  Administered 2014-10-06: 1 g via INTRAVENOUS
  Filled 2014-10-06: qty 10

## 2014-10-06 MED ORDER — LORAZEPAM 2 MG/ML IJ SOLN: 0.5000 mg | INTRAMUSCULAR | Status: DC | PRN

## 2014-10-06 MED ORDER — DOPAMINE-DEXTROSE 3.2-5 MG/ML-% IV SOLN: 0.0000 ug/kg/min | Freq: Once | INTRAVENOUS | Status: DC

## 2014-10-06 MED ORDER — SENNOSIDES-DOCUSATE SODIUM 8.6-50 MG PO TABS: 1.0000 | ORAL_TABLET | Freq: Every evening | ORAL | Status: DC | PRN

## 2014-10-06 NOTE — H&P (Signed)
[facility] History and Physical  Regina [name] KZS:[id number] DOB: 09/18/1941 DOA: 10/06/2014  Referring physician: ED physician PCP: Delia Chimes, NP   Chief Complaint: confusion and lethargy   HPI:  Regina [name] is a 73yo woman with PMH of HTN, HLD, TIAs, hypothyroidism, DM2, anemia, GERD, aortic stenosis who presents with worsening lethargy and confusion.  Her daughter is in the room and confirms the history.  She reports that Regina [name] has been living in [facility] and that she came home on Christmas Eve and was noted to be more lethargic and sleeping a lot.  She is somewhat confused and this has gotten worse.  Importantly, Regina [name] has been declining in health in the past year after having TIAs in 2014.  She has had multiple admissions and ED visits due to DKA and UTIs which have been recurrent.  Regina [name] daughter reports that her mother has basically become bedbound and had falls when she attempts to get out of bed.  They have discussed her end of life wishes and Regina [name] has told her on multiple occasions that if she were to get sick again, she would prefer to be made comfort only and to be DNR.  On review, Regina [name] has been in the hospital 4 times in the past year with similar issues and to the ED twice.    When Regina [name] originally presented to the ED, she was initiated on medical therapy for presumed sepsis with hyperkalemia and tachycardia.  She was given calcium gluconate, IVF, insulin and a dose of rocephin.  However, upon further discussion with the family, it was decided to honor her wishes to be comfort care only.   Assessment and Plan:  Comfort measures, DNR: Family has expressed the patients desire to be comfort care only due to debility at home and frequent hospitalizations for DKA and infections.  - PC consult in the AM - Comfort measures, oxygen, anti-anxiety medications as needed - Pain control with IV and oral medications as needed  Sepsis, possibly related to UTI: UA is + for  leukocytes and many bacteria - No further work up at this time due to comfort measures listed above - Her BP is stable at this time  Altered mental status, lethargy: Progressive, but worsened over last 2 days, possibly due to UTI and AKI - Comfort measures only as noted above   Radiological Exams on Admission: Dg Lumbar Spine Complete  10/06/2014   CLINICAL DATA:  Back pain, no known injury, initial encounter  EXAM: LUMBAR SPINE - COMPLETE 4+ VIEW  COMPARISON:  09/12/2013  FINDINGS: Five lumbar type vertebral bodies are well visualized. Osteophytic changes are seen. A mild compression deformity is noted at T12 which is chronic in nature. Chronic appearing superior irregularity at L4 is noted as well. Diffuse aortic calcifications are seen no spondylolisthesis is noted. Mild osteopenia is noted.  IMPRESSION: No acute abnormality is noted.  Diffuse degenerative changes noted.   Electronically Signed   By: Inez Catalina M.D.   On: 10/06/2014 21:48   Ct Head Wo Contrast  10/06/2014   CLINICAL DATA:  Altered mental status  EXAM: CT HEAD WITHOUT CONTRAST  TECHNIQUE: Contiguous axial images were obtained from the base of the skull through the vertex without intravenous contrast.  COMPARISON:  12/31/2013  FINDINGS: The bony calvarium is intact. No gross soft tissue abnormality is seen. Atrophic changes are again identified as are chronic white matter ischemic changes. No acute hemorrhage, acute infarction or space-occupying mass lesion  is identified.  IMPRESSION: Chronic ischemic and atrophic changes without acute abnormality.   Electronically Signed   By: Inez Catalina M.D.   On: 10/06/2014 18:36   Dg Chest Portable 1 View  10/06/2014   CLINICAL DATA:  Low heart rate today. Unresponsive. History of diabetes and hypertension.  EXAM: PORTABLE CHEST - 1 VIEW  COMPARISON:  12/31/2013  FINDINGS: Mild cardiomegaly. No overt edema. No confluent opacities or effusions. No acute bony abnormality.  IMPRESSION:  Cardiomegaly.  No active disease.   Electronically Signed   By: Rolm Baptise M.D.   On: 10/06/2014 18:43   Code Status: Full Family Communication: Pt at bedside Disposition Plan: Admit for further evaluation     Review of Systems:  Unable to obtain due to patient status, confused and lethargic.  Per daughter, increased confusion, sleepiness and debility which has been progressive.    Reviewed with daughter Past Medical History  Diagnosis Date  . Hypertension   . Hyperlipidemia   . TIA (transient ischemic attack) 1990's    "mini strokes" (06/07/2013)  . Legally blind     "both eyes; some vision in the right' (06/07/2013)  . Heart murmur 1960's  . Hypothyroidism   . Type II diabetes mellitus   . Anemia   . GERD (gastroesophageal reflux disease)   . Arthritis     "left leg" (06/07/2013)  . Depression   . Vulva cancer   . Altered mental status     "the first time I noticed any problem was 2 d ago" (06/07/2013)  . Aortic stenosis     mod-severe    Past Surgical History  Procedure Laterality Date  . Femur fracture surgery Left 1999  . Cesarean section  1968  . Tubal ligation  1970's  . Vulva surgery  1990's    "cut a big chunk out for cancer" (06/07/2013)  . Transthoracic echocardiogram  02/2013    EF 55-60%, mod conc hypertrophy, grade 1 diastolic dysfunction; AV with mod stenosis; calcified MV annulus & mild MR; LA severely dilated; PA peak pressure 66mmHg    Social History:  reports that she quit smoking about 27 years ago. Her smoking use included Cigarettes. She has a 120 pack-year smoking history. She has never used smokeless tobacco. She reports that she does not drink alcohol or use illicit drugs.  Allergies  Allergen Reactions  . Ciprofloxacin Hives  . Codeine Other (See Comments)    REACTION: nervous, shaky  . Hydrochlorothiazide W-Triamterene Other (See Comments)    REACTION: hyponatremia  . Metformin And Related Other (See Comments)    Un specified  .  Ticlopidine Hcl Other (See Comments)    REACTION: bleeding  . Amoxicillin Rash    Family History  Problem Relation Age of Onset  . Hypertension Mother   . Hyperlipidemia Mother   . Hyperlipidemia Father   . Hypertension Father   . Diabetes Father    Reviewed with daughter, will be held on admission Prior to Admission medications   Medication Sig Start Date End Date Taking? Authorizing Provider  amLODipine (NORVASC) 5 MG tablet Take 1 tablet (5 mg total) by mouth daily. 11/29/13  Yes Geradine Girt, DO  atorvastatin (LIPITOR) 20 MG tablet Take 20 mg by mouth at bedtime.   Yes Historical Provider, MD  calcium-vitamin D (OSCAL WITH D) 500-200 MG-UNIT per tablet Take 1 tablet by mouth 2 (two) times daily.   Yes Historical Provider, MD  carvedilol (COREG) 25 MG tablet Take 1 tablet (25  mg total) by mouth 2 (two) times daily with a meal. 10/12/13  Yes Allie Bossier, MD  Cranberry-Vitamin C-Inulin (UTI-STAT) LIQD Take 30 mLs by mouth 2 (two) times daily.   Yes Historical Provider, MD  dipyridamole-aspirin (AGGRENOX) 200-25 MG per 12 hr capsule Take 1 capsule by mouth 2 (two) times daily. 06/09/13  Yes Shanker Kristeen Mans, MD  DULoxetine (CYMBALTA) 60 MG capsule Take 60 mg by mouth daily.   Yes Historical Provider, MD  ezetimibe (ZETIA) 10 MG tablet Take 10 mg by mouth at bedtime.    Yes Historical Provider, MD  ferrous sulfate 325 (65 FE) MG tablet Take 325 mg by mouth daily with breakfast.   Yes Historical Provider, MD  insulin aspart (NOVOLOG) 100 UNIT/ML injection Inject 0-15 Units into the skin every 4 (four) hours. Patient taking differently: Inject 5 Units into the skin 2 (two) times daily.  08/13/14  Yes [name], MD  insulin detemir (LEVEMIR) 100 UNIT/ML injection Inject 0.2 mLs (20 Units total) into the skin daily. Patient taking differently: Inject 38 Units into the skin every evening.  08/13/14  Yes [name], MD  insulin lispro (HUMALOG KWIKPEN) 100 UNIT/ML KiwkPen Inject 2-12  Units into the skin 3 (three) times daily with meals. Sliding scale: 150-199 = 2 units 200-249 = 4 units 250-299 = 6 units 300-349 = 8 units 350-399 = 10 units 400-449 = 12 units   Yes Historical Provider, MD  insulin lispro (HUMALOG) 100 UNIT/ML injection Inject 4 Units into the skin 3 (three) times daily with meals.   Yes Historical Provider, MD  latanoprost (XALATAN) 0.005 % ophthalmic solution Place 1 drop into both eyes at bedtime.   Yes Historical Provider, MD  levothyroxine (SYNTHROID, LEVOTHROID) 25 MCG tablet Take 25 mcg by mouth daily before breakfast.   Yes Historical Provider, MD  Memantine HCl ER (NAMENDA XR) 7 MG CP24 Take 7 mg by mouth at bedtime.    Yes Historical Provider, MD  Multiple Vitamin (MULTIVITAMIN WITH MINERALS) TABS tablet Take 1 tablet by mouth daily.   Yes Historical Provider, MD  Omega-3 Fatty Acids (FISH OIL) 1000 MG CAPS Take 2,000 mg by mouth daily.   Yes Historical Provider, MD  potassium chloride SA (K-DUR,KLOR-CON) 20 MEQ tablet Take 20 mEq by mouth 2 (two) times daily.   Yes Historical Provider, MD  promethazine (PHENERGAN) 12.5 MG tablet Take 12.5 mg by mouth every 6 (six) hours as needed for nausea or vomiting.   Yes Historical Provider, MD  saccharomyces boulardii (FLORASTOR) 250 MG capsule Take 500 mg by mouth daily.   Yes Historical Provider, MD  sitaGLIPtin (JANUVIA) 100 MG tablet Take 100 mg by mouth daily.   Yes Historical Provider, MD  cefUROXime (CEFTIN) 250 MG tablet Take 1 tablet (250 mg total) by mouth 2 (two) times daily with a meal. Patient not taking: Reported on 10/06/2014 08/13/14   [name], MD  insulin aspart (NOVOLOG) 100 UNIT/ML injection Inject 4 Units into the skin 3 (three) times daily with meals. Patient not taking: Reported on 10/06/2014 08/13/14   [name], MD  levothyroxine (SYNTHROID, LEVOTHROID) 75 MCG tablet Take 1 tablet (75 mcg total) by mouth daily before breakfast. Patient not taking: Reported on 10/06/2014  06/07/13   Nishant Dhungel, MD  potassium chloride SA (K-DUR,KLOR-CON) 20 MEQ tablet Take 1 tablet (20 mEq total) by mouth daily. 01/05/14 01/08/14  Kinnie Feil, MD    Physical Exam: Filed Vitals:   10/06/14 2100 10/06/14 2230 10/06/14 2245  10/06/14 2301  BP: 102/57 87/74 111/62 130/64  Pulse: 70 66 66 64  Temp:    97.7 F (36.5 C)  TempSrc:    Axillary  Resp:  11  18  Height:    5\' 2"  (1.575 m)  Weight:    182 lb 5.1 oz (82.7 kg)  SpO2: 92% 94% 92% 98%    Physical Exam  Constitutional: Asleep, opens eyes to voice HENT: NCAT Eyes: Conjunctivae normal, anicteric CVS: bradycardic to the 50s, normal rhythm, Z6/X0 +, + systolic murmur, no gallops Pulmonary: Effort and breath sounds normal, no wheezing Abdominal: Soft. BS +,  no distension, tenderness Musculoskeletal: No edema and no tenderness.  Neuro: Alert to voice. No overt CN deficit Skin: Skin is warm and dry. No rash noted.   Labs on Admission:  Basic Metabolic Panel:  Recent Labs Lab 10/06/14 1855 10/06/14 1905  NA 139 139  K 5.9* 5.8*  CL 110 108  CO2 22  --   GLUCOSE 80 77  BUN 26* 29*  CREATININE 1.50* 1.50*  CALCIUM 9.2  --    Liver Function Tests:  Recent Labs Lab 10/06/14 1855  AST 33  ALT 55*  ALKPHOS 59  BILITOT 0.6  PROT 5.6*  ALBUMIN 3.0*   CBC:  Recent Labs Lab 10/06/14 1855 10/06/14 1905  WBC 7.0  --   NEUTROABS 3.3  --   HGB 12.2 12.6  HCT 35.9* 37.0  MCV 92.3  --   PLT 172  --    Cardiac Enzymes:  Recent Labs Lab 10/06/14 1855  TROPONINI <0.03   CBG:  Recent Labs Lab 10/06/14 1845 10/06/14 2017 10/06/14 2158  GLUCAP 69* 156* 175*    EKG: Bradycardic, junctional rhythm  Gilles Chiquito, MD  [facility] Pager [phone]  If 7PM-7AM, please contact night-coverage [email or web address] Password [email or web address] 10/06/2014, 11:20 PM

## 2014-10-06 NOTE — ED Provider Notes (Signed)
CSN: [id number]     Arrival date & time 10/06/14  1729 History   First MD Initiated Contact with Patient 10/06/14 1747     Chief Complaint  Patient presents with  . Altered Mental Status     (Consider location/radiation/quality/duration/timing/severity/associated sxs/prior Treatment) Patient is a 73 y.o. female presenting with altered mental status.  Altered Mental Status Presenting symptoms: confusion and lethargy   Severity:  Severe Duration:  4 weeks ((one normal day in last 4 weeks)) Timing:  Intermittent Progression:  Waxing and waning Chronicity:  New Context: nursing home resident and recent infection   Context: not head injury   Context comment:  Fall 1 week ago, finished bactrim 1 week ago for uti Associated symptoms: decreased appetite and vomiting (occasional, sometimes with certain medicines)   Associated symptoms: no abdominal pain, no difficulty breathing, no fever, no headaches, no nausea and no rash     Past Medical History  Diagnosis Date  . Hypertension   . Hyperlipidemia   . TIA (transient ischemic attack) 1990's    "mini strokes" (06/07/2013)  . Legally blind     "both eyes; some vision in the right' (06/07/2013)  . Heart murmur 1960's  . Hypothyroidism   . Type II diabetes mellitus   . Anemia   . GERD (gastroesophageal reflux disease)   . Arthritis     "left leg" (06/07/2013)  . Depression   . Vulva cancer   . Altered mental status     "the first time I noticed any problem was 2 d ago" (06/07/2013)  . Aortic stenosis     mod-severe   Past Surgical History  Procedure Laterality Date  . Femur fracture surgery Left 1999  . Cesarean section  1968  . Tubal ligation  1970's  . Vulva surgery  1990's    "cut a big chunk out for cancer" (06/07/2013)  . Transthoracic echocardiogram  02/2013    EF 55-60%, mod conc hypertrophy, grade 1 diastolic dysfunction; AV with mod stenosis; calcified MV annulus & mild MR; LA severely dilated; PA peak pressure 3mmHg    Family History  Problem Relation Age of Onset  . Hypertension Mother   . Hyperlipidemia Mother   . Hyperlipidemia Father   . Hypertension Father   . Diabetes Father    History  Substance Use Topics  . Smoking status: Former Smoker -- 4.00 packs/day for 30 years    Types: Cigarettes    Quit date: 09/14/1987  . Smokeless tobacco: Never Used     Comment: 06/07/2013 "hasn't smoked since her stroke in the 1990's"  . Alcohol Use: No   OB History    No data available     Review of Systems  Unable to perform ROS: Mental status change  Constitutional: Positive for activity change, appetite change, fatigue and decreased appetite. Negative for fever.  Respiratory: Negative for cough.   Cardiovascular: Negative for chest pain.  Gastrointestinal: Positive for vomiting (occasional, sometimes with certain medicines). Negative for nausea and abdominal pain.  Genitourinary: Negative for difficulty urinating (foul smelling urine, "mucous").  Skin: Negative for rash.  Neurological: Negative for headaches.  Psychiatric/Behavioral: Positive for confusion.      Allergies  Ciprofloxacin; Codeine; Hydrochlorothiazide w-triamterene; Metformin and related; Ticlopidine hcl; and Amoxicillin  Home Medications   Prior to Admission medications   Medication Sig Start Date End Date Taking? Authorizing Provider  amLODipine (NORVASC) 5 MG tablet Take 1 tablet (5 mg total) by mouth daily. 11/29/13  Yes Geradine Girt, DO  atorvastatin (LIPITOR) 20 MG tablet Take 20 mg by mouth at bedtime.   Yes Historical Provider, MD  calcium-vitamin D (OSCAL WITH D) 500-200 MG-UNIT per tablet Take 1 tablet by mouth 2 (two) times daily.   Yes Historical Provider, MD  carvedilol (COREG) 25 MG tablet Take 1 tablet (25 mg total) by mouth 2 (two) times daily with a meal. 10/12/13  Yes Allie Bossier, MD  Cranberry-Vitamin C-Inulin (UTI-STAT) LIQD Take 30 mLs by mouth 2 (two) times daily.   Yes Historical Provider, MD   dipyridamole-aspirin (AGGRENOX) 200-25 MG per 12 hr capsule Take 1 capsule by mouth 2 (two) times daily. 06/09/13  Yes Shanker Kristeen Mans, MD  DULoxetine (CYMBALTA) 60 MG capsule Take 60 mg by mouth daily.   Yes Historical Provider, MD  ezetimibe (ZETIA) 10 MG tablet Take 10 mg by mouth at bedtime.    Yes Historical Provider, MD  ferrous sulfate 325 (65 FE) MG tablet Take 325 mg by mouth daily with breakfast.   Yes Historical Provider, MD  insulin aspart (NOVOLOG) 100 UNIT/ML injection Inject 0-15 Units into the skin every 4 (four) hours. Patient taking differently: Inject 5 Units into the skin 2 (two) times daily.  08/13/14  Yes [name], MD  insulin detemir (LEVEMIR) 100 UNIT/ML injection Inject 0.2 mLs (20 Units total) into the skin daily. Patient taking differently: Inject 38 Units into the skin every evening.  08/13/14  Yes [name], MD  insulin lispro (HUMALOG KWIKPEN) 100 UNIT/ML KiwkPen Inject 2-12 Units into the skin 3 (three) times daily with meals. Sliding scale: 150-199 = 2 units 200-249 = 4 units 250-299 = 6 units 300-349 = 8 units 350-399 = 10 units 400-449 = 12 units   Yes Historical Provider, MD  insulin lispro (HUMALOG) 100 UNIT/ML injection Inject 4 Units into the skin 3 (three) times daily with meals.   Yes Historical Provider, MD  latanoprost (XALATAN) 0.005 % ophthalmic solution Place 1 drop into both eyes at bedtime.   Yes Historical Provider, MD  levothyroxine (SYNTHROID, LEVOTHROID) 25 MCG tablet Take 25 mcg by mouth daily before breakfast.   Yes Historical Provider, MD  Memantine HCl ER (NAMENDA XR) 7 MG CP24 Take 7 mg by mouth at bedtime.    Yes Historical Provider, MD  Multiple Vitamin (MULTIVITAMIN WITH MINERALS) TABS tablet Take 1 tablet by mouth daily.   Yes Historical Provider, MD  Omega-3 Fatty Acids (FISH OIL) 1000 MG CAPS Take 2,000 mg by mouth daily.   Yes Historical Provider, MD  potassium chloride SA (K-DUR,KLOR-CON) 20 MEQ tablet Take 20 mEq by  mouth 2 (two) times daily.   Yes Historical Provider, MD  promethazine (PHENERGAN) 12.5 MG tablet Take 12.5 mg by mouth every 6 (six) hours as needed for nausea or vomiting.   Yes Historical Provider, MD  saccharomyces boulardii (FLORASTOR) 250 MG capsule Take 500 mg by mouth daily.   Yes Historical Provider, MD  sitaGLIPtin (JANUVIA) 100 MG tablet Take 100 mg by mouth daily.   Yes Historical Provider, MD  cefUROXime (CEFTIN) 250 MG tablet Take 1 tablet (250 mg total) by mouth 2 (two) times daily with a meal. Patient not taking: Reported on 10/06/2014 08/13/14   [name], MD  insulin aspart (NOVOLOG) 100 UNIT/ML injection Inject 4 Units into the skin 3 (three) times daily with meals. Patient not taking: Reported on 10/06/2014 08/13/14   [name], MD  levothyroxine (SYNTHROID, LEVOTHROID) 75 MCG tablet Take 1 tablet (75 mcg total) by mouth daily  before breakfast. Patient not taking: Reported on 10/06/2014 06/07/13   Nishant Dhungel, MD  potassium chloride SA (K-DUR,KLOR-CON) 20 MEQ tablet Take 1 tablet (20 mEq total) by mouth daily. 01/05/14 01/08/14  Kinnie Feil, MD   BP 121/64 mmHg  Pulse 77  Temp(Src) 97.9 F (36.6 C) (Axillary)  Resp 16  Ht 5\' 2"  (4.098 m)  Wt 182 lb 5.1 oz (82.7 kg)  BMI 33.34 kg/m2  SpO2 98% Physical Exam  Constitutional: She appears well-developed and well-nourished. She appears listless. She has a sickly appearance. She appears ill. No distress.  HENT:  Head: Normocephalic and atraumatic.  Eyes: Conjunctivae and EOM are normal. Pupils are equal, round, and reactive to light.  Neck: Normal range of motion.  Cardiovascular: Regular rhythm, normal heart sounds and intact distal pulses.  Bradycardia present.  Exam reveals no gallop and no friction rub.   No murmur heard. Pulmonary/Chest: Effort normal and breath sounds normal. No respiratory distress. She has no wheezes. She has no rales.  Abdominal: Soft. She exhibits no distension. There is no  tenderness. There is no guarding.  Musculoskeletal:       Lumbar back: She exhibits tenderness and edema (contusion).  Neurological: She appears listless. GCS eye subscore is 3. GCS verbal subscore is 4. GCS motor subscore is 6.  Skin: Skin is warm and dry. No rash noted. She is not diaphoretic. No erythema.  Nursing note and vitals reviewed.   ED Course  Procedures (including critical care time) Labs Review Labs Reviewed  COMPREHENSIVE METABOLIC PANEL - Abnormal; Notable for the following:    Potassium 5.9 (*)    BUN 26 (*)    Creatinine, Ser 1.50 (*)    Total Protein 5.6 (*)    Albumin 3.0 (*)    ALT 55 (*)    GFR calc non Af Amer 34 (*)    GFR calc Af Amer 39 (*)    All other components within normal limits  CBC WITH DIFFERENTIAL - Abnormal; Notable for the following:    HCT 35.9 (*)    Monocytes Relative 14 (*)    All other components within normal limits  URINALYSIS, ROUTINE W REFLEX MICROSCOPIC - Abnormal; Notable for the following:    APPearance CLOUDY (*)    Hgb urine dipstick SMALL (*)    Bilirubin Urine SMALL (*)    Ketones, ur 15 (*)    Leukocytes, UA LARGE (*)    All other components within normal limits  BRAIN NATRIURETIC PEPTIDE - Abnormal; Notable for the following:    B Natriuretic Peptide 1193.4 (*)    All other components within normal limits  URINE MICROSCOPIC-ADD ON - Abnormal; Notable for the following:    Bacteria, UA MANY (*)    All other components within normal limits  CBG MONITORING, ED - Abnormal; Notable for the following:    Glucose-Capillary 69 (*)    All other components within normal limits  I-STAT CHEM 8, ED - Abnormal; Notable for the following:    Potassium 5.8 (*)    BUN 29 (*)    Creatinine, Ser 1.50 (*)    All other components within normal limits  CBG MONITORING, ED - Abnormal; Notable for the following:    Glucose-Capillary 156 (*)    All other components within normal limits  CBG MONITORING, ED - Abnormal; Notable for the  following:    Glucose-Capillary 175 (*)    All other components within normal limits  URINE CULTURE  TROPONIN I  I-STAT CG4 LACTIC ACID, ED    Imaging Review Dg Lumbar Spine Complete  10/06/2014   CLINICAL DATA:  Back pain, no known injury, initial encounter  EXAM: LUMBAR SPINE - COMPLETE 4+ VIEW  COMPARISON:  09/12/2013  FINDINGS: Five lumbar type vertebral bodies are well visualized. Osteophytic changes are seen. A mild compression deformity is noted at T12 which is chronic in nature. Chronic appearing superior irregularity at L4 is noted as well. Diffuse aortic calcifications are seen no spondylolisthesis is noted. Mild osteopenia is noted.  IMPRESSION: No acute abnormality is noted.  Diffuse degenerative changes noted.   Electronically Signed   By: Inez Catalina M.D.   On: 10/06/2014 21:48   Ct Head Wo Contrast  10/06/2014   CLINICAL DATA:  Altered mental status  EXAM: CT HEAD WITHOUT CONTRAST  TECHNIQUE: Contiguous axial images were obtained from the base of the skull through the vertex without intravenous contrast.  COMPARISON:  12/31/2013  FINDINGS: The bony calvarium is intact. No gross soft tissue abnormality is seen. Atrophic changes are again identified as are chronic white matter ischemic changes. No acute hemorrhage, acute infarction or space-occupying mass lesion is identified.  IMPRESSION: Chronic ischemic and atrophic changes without acute abnormality.   Electronically Signed   By: Inez Catalina M.D.   On: 10/06/2014 18:36   Dg Chest Portable 1 View  10/06/2014   CLINICAL DATA:  Low heart rate today. Unresponsive. History of diabetes and hypertension.  EXAM: PORTABLE CHEST - 1 VIEW  COMPARISON:  12/31/2013  FINDINGS: Mild cardiomegaly. No overt edema. No confluent opacities or effusions. No acute bony abnormality.  IMPRESSION: Cardiomegaly.  No active disease.   Electronically Signed   By: Rolm Baptise M.D.   On: 10/06/2014 18:43     EKG Interpretation   Date/Time:  Saturday  October 06 2014 17:38:13 EST Ventricular Rate:  47 PR Interval:    QRS Duration: 136 QT Interval:  456 QTC Calculation: 403 R Axis:   -83 Text Interpretation:  Junctional rhythm (new) Nonspecific IVCD with LAD  LVH with secondary repolarization abnormality Abnormal ekg Confirmed by  Audie Pinto  MD, ROBERT ([id number]) on 10/06/2014 6:08:16 PM      MDM   Final diagnoses:  Altered mental status  Fall  Sepsis secondary to urinary source AKI Hyperkalemia     72 year old female with history of chronic diastolic CHF, aortic stenosis, hypertension, frequent UTIs, DM, frequent hospitalizations presents with concern for altered mental status.  Patient initially with MAPs low 60s, HR in 40s.Marland Kitchen Urinalysis showed signs of UTI which is likely cause of AMS and pt was given rocephin for concern of sepsis secondary to UTI.  ISTAT showed K of 5.8 and Ca given for concern of bradycardia secondary to hyperkalemia and HR improved to 70s.  CT head done for concern of fall and negative. Lumbar XR from fall with back pain without acute findings. CXR without acute findings.  Pt with MAPs around 60-65 and began discussing pressors with daughter who reports that patient had expressed after last hospitalization she would like to be comfort care and at this time wishes to respect her wishes and focus on comfort rather than cure.  Patient made DNR/Comfort care only and will withold all curative measures per pt wishes as discussed with daughter.  Pt admitted to floor for comfort care and palliative consult.      Alvino Chapel, MD 10/07/14 [place], MD 10/10/14 1037

## 2014-10-06 NOTE — ED Notes (Signed)
Pt transported to radiology.

## 2014-10-06 NOTE — ED Notes (Signed)
CBG = 69  RN and MD Schlossman informed of results.

## 2014-10-06 NOTE — ED Notes (Signed)
Pt sleeping. HR decreasing into 30's occasionally. MD notified.

## 2014-10-06 NOTE — ED Notes (Signed)
Per ems-- pt from [facility] with c/o increased lethargy. Hx frequent UTI. But no urinary sx today. Pt hr found to be 46. Pt appears pale. cbg 103. resp 16, pt slightly hypotensive. Pt arouses when spoken to.

## 2014-10-06 NOTE — ED Notes (Signed)
Attempted report 

## 2014-10-06 NOTE — ED Notes (Signed)
Pt monitored by pulse ox, bp cuff, and 12-lead. 

## 2014-10-07 DIAGNOSIS — N3000 Acute cystitis without hematuria: Secondary | ICD-10-CM

## 2014-10-07 DIAGNOSIS — Z7189 Other specified counseling: Secondary | ICD-10-CM

## 2014-10-07 DIAGNOSIS — G9340 Encephalopathy, unspecified: Secondary | ICD-10-CM

## 2014-10-07 DIAGNOSIS — I350 Nonrheumatic aortic (valve) stenosis: Secondary | ICD-10-CM

## 2014-10-07 NOTE — Progress Notes (Signed)
[facility] PROGRESS NOTE  Regina [name] LGX:[id number] DOB: 21-May-1941 DOA: 10/06/2014 PCP: Delia Chimes, NP  Assessment/Plan: 1. Goals of care.  -Discussion held with patient and family members at bedside, reaffirming the goals of care as focusing on comfort. Family members expressing patient's desire for her care to be focused on comfort only as she is unwilling to undergo further hospitalizations or invasive procedures. Family members wishing to honor her wishes. Will not pursue further workup or initiate antimicrobial therapy, provide as needed narcotic analgesics and benzodiazepine therapy for comfort measures. Her daughter would like for her mother to be home with her with hospice services following. Consult placed to social work and case manager to assist with providing hospital bed and hospice services.   2.  Acute encephalopathy -Likely secondary to infectious process  3.  Hypertension  4.  Type 2 diabetes mellitus with history of DKA's  5.  Moderate to severe aortic stenosis  6.  History of over cancer   Code Status: DO NOT RESUSCITATE Family Communication: Spoke to her daughter was present at bedside Disposition Plan: Anticipate discharge home with home hospice     HPI/Subjective: Patient is a pleasant 73 year old with a past medical history of type 2 diabetes mellitus, recurrent urinary tract infections, history of recurrent hospitalizations due to diabetic ketoacidosis and UTIs, last discharged from the medicine service on 08/13/2014 at which time she was treated for DKA, presented to the emergency department on 10/06/2014 with complaints of altered mental status, lethargy, state functional decline. Patient had verbalized to family members that she did not want to undergo further hospitalizations nor be subjective to burdensome procedures. She expressed her desire to be made comfortable. Although family members did not agree with this they felt compelled to order her  wishes.   Objective: Filed Vitals:   10/07/14 0521  BP: 121/64  Pulse: 77  Temp: 97.9 F (36.6 C)  Resp: 16   No intake or output data in the 24 hours ending 10/07/14 1317 Filed Weights   10/06/14 2000 10/06/14 2026 10/06/14 2301  Weight: 78.926 kg (174 lb) 78.926 kg (174 lb) 82.7 kg (182 lb 5.1 oz)    Exam:   General:  Patient is lethargic however was arousable. She did not complain of pain or discomfort  Cardiovascular: 3-6 systolic ejection murmur with radiation to carotids, 1+ bilateral ectropion pitting edema  Respiratory: Has bibasilar crackles, normal respiratory effort  Abdomen: Soft nontender nondistended  Musculoskeletal: 1+ bilateral external dependent edema  Data Reviewed: Basic Metabolic Panel:  Recent Labs Lab 10/06/14 1855 10/06/14 1905  NA 139 139  K 5.9* 5.8*  CL 110 108  CO2 22  --   GLUCOSE 80 77  BUN 26* 29*  CREATININE 1.50* 1.50*  CALCIUM 9.2  --    Liver Function Tests:  Recent Labs Lab 10/06/14 1855  AST 33  ALT 55*  ALKPHOS 59  BILITOT 0.6  PROT 5.6*  ALBUMIN 3.0*   No results for input(s): LIPASE, AMYLASE in the last 168 hours. No results for input(s): AMMONIA in the last 168 hours. CBC:  Recent Labs Lab 10/06/14 1855 10/06/14 1905  WBC 7.0  --   NEUTROABS 3.3  --   HGB 12.2 12.6  HCT 35.9* 37.0  MCV 92.3  --   PLT 172  --    Cardiac Enzymes:  Recent Labs Lab 10/06/14 1855  TROPONINI <0.03   BNP (last 3 results) No results for input(s): PROBNP in the last 8760 hours. CBG:  Recent  Labs Lab 10/06/14 1845 10/06/14 2017 10/06/14 2158  GLUCAP 69* 156* 175*    No results found for this or any previous visit (from the past 240 hour(s)).   Studies: Dg Lumbar Spine Complete  10/06/2014   CLINICAL DATA:  Back pain, no known injury, initial encounter  EXAM: LUMBAR SPINE - COMPLETE 4+ VIEW  COMPARISON:  09/12/2013  FINDINGS: Five lumbar type vertebral bodies are well visualized. Osteophytic changes are  seen. A mild compression deformity is noted at T12 which is chronic in nature. Chronic appearing superior irregularity at L4 is noted as well. Diffuse aortic calcifications are seen no spondylolisthesis is noted. Mild osteopenia is noted.  IMPRESSION: No acute abnormality is noted.  Diffuse degenerative changes noted.   Electronically Signed   By: Inez Catalina M.D.   On: 10/06/2014 21:48   Ct Head Wo Contrast  10/06/2014   CLINICAL DATA:  Altered mental status  EXAM: CT HEAD WITHOUT CONTRAST  TECHNIQUE: Contiguous axial images were obtained from the base of the skull through the vertex without intravenous contrast.  COMPARISON:  12/31/2013  FINDINGS: The bony calvarium is intact. No gross soft tissue abnormality is seen. Atrophic changes are again identified as are chronic white matter ischemic changes. No acute hemorrhage, acute infarction or space-occupying mass lesion is identified.  IMPRESSION: Chronic ischemic and atrophic changes without acute abnormality.   Electronically Signed   By: Inez Catalina M.D.   On: 10/06/2014 18:36   Dg Chest Portable 1 View  10/06/2014   CLINICAL DATA:  Low heart rate today. Unresponsive. History of diabetes and hypertension.  EXAM: PORTABLE CHEST - 1 VIEW  COMPARISON:  12/31/2013  FINDINGS: Mild cardiomegaly. No overt edema. No confluent opacities or effusions. No acute bony abnormality.  IMPRESSION: Cardiomegaly.  No active disease.   Electronically Signed   By: Rolm Baptise M.D.   On: 10/06/2014 18:43    Scheduled Meds:  Continuous Infusions:   Active Problems:   Sepsis   Altered mental status    Time spent: 25 minutes    [name]  [facility] Pager [phone]. If 7PM-7AM, please contact night-coverage at [email or web address], password [facility] 10/07/2014, 1:17 PM  LOS: 1 day

## 2014-10-08 DIAGNOSIS — G9340 Encephalopathy, unspecified: Secondary | ICD-10-CM | POA: Diagnosis present

## 2014-10-08 DIAGNOSIS — E1159 Type 2 diabetes mellitus with other circulatory complications: Secondary | ICD-10-CM

## 2014-10-08 DIAGNOSIS — E860 Dehydration: Secondary | ICD-10-CM

## 2014-10-08 DIAGNOSIS — A419 Sepsis, unspecified organism: Principal | ICD-10-CM

## 2014-10-08 DIAGNOSIS — I5032 Chronic diastolic (congestive) heart failure: Secondary | ICD-10-CM

## 2014-10-08 LAB — URINE CULTURE
COLONY COUNT: NO GROWTH
Culture: NO GROWTH

## 2014-10-08 MED ORDER — MORPHINE SULFATE 20 MG/5ML PO SOLN: 5.0000 mg | ORAL | Status: AC | PRN

## 2014-10-08 MED ORDER — LORAZEPAM 2 MG/ML IJ SOLN: 0.5000 mg | INTRAMUSCULAR | Status: AC | PRN

## 2014-10-08 MED ORDER — MORPHINE SULFATE 2 MG/ML IJ SOLN
1.0000 mg | INTRAMUSCULAR | Status: DC | PRN
  Administered 2014-10-08 – 2014-10-09 (×4): 1 mg via INTRAVENOUS
  Filled 2014-10-08 (×3): qty 1

## 2014-10-08 MED ORDER — LORAZEPAM 0.5 MG PO TABS: 0.5000 mg | ORAL_TABLET | ORAL | Status: AC | PRN

## 2014-10-08 NOTE — Clinical Social Work Note (Signed)
Pt to be discharged to [place] (with hospice services following). Pt's daughter updated at bedside.  Facility: [facility] Report number: [phone] Transportation: EMS ([address])  Lubertha Sayres, [place] [phone]) Licensed Clinical Social Worker Orthopedics [phone]) and Surgical [phone])

## 2014-10-08 NOTE — Discharge Summary (Signed)
Physician Discharge Summary  Regina [name] EHM:[id number] DOB: 1940/10/28 DOA: 10/06/2014  PCP: Delia Chimes, NP  Admit date: 10/06/2014 Discharge date: 10/08/2014  Time spent: 35 minutes  Recommendations for Outpatient Follow-up:  1. 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 Her daughter is in the room and confirms the history. She reports that Regina [name] has been living in [facility] and that she came home on Christmas Eve and was noted to be more lethargic and sleeping a lot. She is somewhat confused and this has gotten worse. Importantly, Regina [name] has been declining in health in the past year after having TIAs in 2014. She has had multiple admissions and ED visits due to DKA and UTIs which have been recurrent. Regina [name] daughter reports that her mother has basically become bedbound and had falls when she attempts to get out of bed. They have discussed her end of life wishes and Regina [name] has told her on multiple occasions that if she were to get sick again, she would prefer to be made comfort only and to be DNR. On review, Regina [name] has been in the hospital 4 times in the past year with similar  issues and to the ED twice.   When [name] originally presented to the ED, she was initiated on medical therapy for presumed sepsis with hyperkalemia and tachycardia. She was given calcium gluconate, IVF, insulin and a dose of rocephin. However, upon further discussion with the family, it was decided to honor her wishes to be comfort care only.   Hospital Course:  Patient is a pleasant 73 year old female with multiple comorbidities including moderate aortic stenosis, type 2 diabetes mellitus, history recurrent urinary tract infections, having multiple previous hospitalizations, nursing home resident having a poor functional status, who was admitted to the medicine service on 10/06/2014. She presented with a steep functional decline, mental status changes, lethargy. Sepsis evidenced by hypotension having blood pressure of 87/74, toxic encephalopathy with source of infection likely urinary tract. Urinalysis showing the presence of many bacteria, large leukocyte esterase, cloudy urine. She was admitted to 6N. goals of care discussed with family members who expressed desire for patient's care to be focused on comfort rather than performing further aggressive/burdensome interventions. Family members did not wish to initiate antimicrobial therapy. During this hospitalization I spoke with her regarding goals of care and agreed with focusing her care on comfort. Social work was consulted. Daughter felt that she would not be able to care for her at home for which plans were made for her to be transferred back to her skilled nursing facility where hospice services would continue to follow her. Patient discharged to skilled nursing facility on 10/08/2014.   Consultations:  Social work  Discharge Exam: Filed Vitals:   10/08/14 0900  BP: 101/37  Pulse: 41  Temp:   Resp:     General: Patient was awake, confused and disoriented on day of discharge Cardiovascular: Regular rate and rhythm normal S1-S2,  tachycardic Respiratory: Normal respiratory effort, lungs clear Abdomen: Soft nontender nondistended Extremities: Trace edema to lower extremities bilaterally  Discharge Instructions   Discharge Instructions    Call MD for:  difficulty breathing, headache or visual disturbances    Complete by:  As directed      Call MD for:  extreme fatigue    Complete by:  As directed      Call MD for:  hives    Complete by:  As directed      Call MD for:  persistant dizziness or light-headedness    Complete by:  As directed      Call MD for:  persistant nausea and vomiting    Complete by:  As directed      Call MD for:  redness, tenderness, or signs of infection (pain, swelling, redness, odor or green/yellow discharge around incision site)    Complete by:  As directed      Call MD for:  severe uncontrolled pain    Complete by:  As directed      Call MD for:  temperature >100.4    Complete by:  As directed      Call MD for:    Complete by:  As directed      Diet - low sodium heart healthy    Complete by:  As directed      Increase activity slowly    Complete by:  As directed           Current Discharge Medication List    START taking these medications   Details  LORazepam (ATIVAN) 0.5 MG tablet Take 1 tablet (0.5 mg total) by mouth every 4 (four) hours as needed for anxiety or sleep. Qty: 15 tablet, Refills: 0    LORazepam (ATIVAN) 2 MG/ML injection Inject 0.25 mLs (0.5 mg total) into the muscle every 4 (four) hours as needed for anxiety. Qty: 1 mL, Refills: 0    morphine 20 MG/5ML solution Take 1.3 mLs (5.2 mg total) by mouth every 2 (two) hours as needed for pain. Qty: 500 mL, Refills: 0      CONTINUE these medications which have NOT CHANGED   Details  levothyroxine (SYNTHROID, LEVOTHROID) 75 MCG tablet Take 1 tablet (75 mcg total) by mouth daily before breakfast. Qty: 30 tablet, Refills: 0      STOP taking these medications     amLODipine (NORVASC) 5 MG tablet       atorvastatin (LIPITOR) 20 MG tablet      calcium-vitamin D (OSCAL WITH D) 500-200 MG-UNIT per tablet      carvedilol (COREG) 25 MG tablet      Cranberry-Vitamin C-Inulin (UTI-STAT) LIQD      dipyridamole-aspirin (AGGRENOX) 200-25 MG per 12 hr capsule      DULoxetine (CYMBALTA) 60 MG capsule      ezetimibe (ZETIA) 10 MG tablet      ferrous sulfate 325 (65 FE) MG tablet      insulin aspart (NOVOLOG) 100 UNIT/ML injection      insulin detemir (LEVEMIR) 100 UNIT/ML injection      insulin lispro (HUMALOG KWIKPEN) 100 UNIT/ML KiwkPen      insulin lispro (HUMALOG) 100 UNIT/ML injection      latanoprost (  XALATAN) 0.005 % ophthalmic solution      Memantine HCl ER (NAMENDA XR) 7 MG CP24      Multiple Vitamin (MULTIVITAMIN WITH MINERALS) TABS tablet      Omega-3 Fatty Acids (FISH OIL) 1000 MG CAPS      potassium chloride SA (K-DUR,KLOR-CON) 20 MEQ tablet      promethazine (PHENERGAN) 12.5 MG tablet      saccharomyces boulardii (FLORASTOR) 250 MG capsule      sitaGLIPtin (JANUVIA) 100 MG tablet      cefUROXime (CEFTIN) 250 MG tablet      insulin aspart (NOVOLOG) 100 UNIT/ML injection      potassium chloride SA (K-DUR,KLOR-CON) 20 MEQ tablet        Allergies  Allergen Reactions  . Ciprofloxacin Hives  . Codeine Other (See Comments)    REACTION: nervous, shaky  . Hydrochlorothiazide W-Triamterene Other (See Comments)    REACTION: hyponatremia  . Metformin And Related Other (See Comments)    Un specified  . Ticlopidine Hcl Other (See Comments)    REACTION: bleeding  . Amoxicillin Rash      The results of significant diagnostics from this hospitalization (including imaging, microbiology, ancillary and laboratory) are listed below for reference.    Significant Diagnostic Studies: Dg Lumbar Spine Complete  10/06/2014   CLINICAL DATA:  Back pain, no known injury, initial encounter  EXAM: LUMBAR SPINE - COMPLETE 4+ VIEW  COMPARISON:  09/12/2013  FINDINGS: Five lumbar  type vertebral bodies are well visualized. Osteophytic changes are seen. A mild compression deformity is noted at T12 which is chronic in nature. Chronic appearing superior irregularity at L4 is noted as well. Diffuse aortic calcifications are seen no spondylolisthesis is noted. Mild osteopenia is noted.  IMPRESSION: No acute abnormality is noted.  Diffuse degenerative changes noted.   Electronically Signed   By: Inez Catalina M.D.   On: 10/06/2014 21:48   Ct Head Wo Contrast  10/06/2014   CLINICAL DATA:  Altered mental status  EXAM: CT HEAD WITHOUT CONTRAST  TECHNIQUE: Contiguous axial images were obtained from the base of the skull through the vertex without intravenous contrast.  COMPARISON:  12/31/2013  FINDINGS: The bony calvarium is intact. No gross soft tissue abnormality is seen. Atrophic changes are again identified as are chronic white matter ischemic changes. No acute hemorrhage, acute infarction or space-occupying mass lesion is identified.  IMPRESSION: Chronic ischemic and atrophic changes without acute abnormality.   Electronically Signed   By: Inez Catalina M.D.   On: 10/06/2014 18:36   Dg Chest Portable 1 View  10/06/2014   CLINICAL DATA:  Low heart rate today. Unresponsive. History of diabetes and hypertension.  EXAM: PORTABLE CHEST - 1 VIEW  COMPARISON:  12/31/2013  FINDINGS: Mild cardiomegaly. No overt edema. No confluent opacities or effusions. No acute bony abnormality.  IMPRESSION: Cardiomegaly.  No active disease.   Electronically Signed   By: Rolm Baptise M.D.   On: 10/06/2014 18:43    Microbiology: Recent Results (from the past 240 hour(s))  Urine culture     Status: None   Collection Time: 10/06/14  6:36 PM  Result Value Ref Range Status   Specimen Description URINE, CATHETERIZED  Final   Special Requests NONE  Final   Colony Count NO GROWTH Performed at [facility]   Final   Culture NO GROWTH Performed at [facility]   Final   Report Status  10/08/2014 FINAL  Final     Labs: Basic Metabolic  Panel:  Recent Labs Lab 10/06/14 1855 10/06/14 1905  NA 139 139  K 5.9* 5.8*  CL 110 108  CO2 22  --   GLUCOSE 80 77  BUN 26* 29*  CREATININE 1.50* 1.50*  CALCIUM 9.2  --    Liver Function Tests:  Recent Labs Lab 10/06/14 1855  AST 33  ALT 55*  ALKPHOS 59  BILITOT 0.6  PROT 5.6*  ALBUMIN 3.0*   No results for input(s): LIPASE, AMYLASE in the last 168 hours. No results for input(s): AMMONIA in the last 168 hours. CBC:  Recent Labs Lab 10/06/14 1855 10/06/14 1905  WBC 7.0  --   NEUTROABS 3.3  --   HGB 12.2 12.6  HCT 35.9* 37.0  MCV 92.3  --   PLT 172  --    Cardiac Enzymes:  Recent Labs Lab 10/06/14 1855  TROPONINI <0.03   BNP: BNP (last 3 results) No results for input(s): PROBNP in the last 8760 hours. CBG:  Recent Labs Lab 10/06/14 1845 10/06/14 2017 10/06/14 2158  GLUCAP 69* 156* 175*       Signed:  [name]  [facility] 10/08/2014, 12:42 PM

## 2014-10-08 NOTE — Clinical Social Work Psychosocial (Signed)
Clinical Social Work Department BRIEF PSYCHOSOCIAL ASSESSMENT 10/08/2014  Patient:  Regina [name], Regina [name]     Account Number:  [id number]     Admit date:  10/06/2014  Clinical Social Worker:  Delrae Sawyers  Date/Time:  10/08/2014 12:56 PM  Referred by:  Physician  Date Referred:  10/08/2014 Referred for  SNF Placement  Residential hospice placement  Other - See comment   Other Referral:   Home with hospice.   Interview type:  Family Other interview type:   CSW spoke with pt's daughter, Levonne Lapping, regarding discharge disposition.    PSYCHOSOCIAL DATA Living Status:  FACILITY Admitted from facility:  [facility] Level of care:  [place] Primary support name:  Levonne Lapping Primary support relationship to patient:  CHILD, ADULT Degree of support available:   Strong support system.    CURRENT CONCERNS Current Concerns  Post-Acute Placement   Other Concerns:   none.    SOCIAL WORK ASSESSMENT / PLAN CSW received referral for pt to possibly return to SNF ([facility]), with hospice following, at time of discharge. CSW met with pt's daughter at bedside to discuss discharge disposition. Pt's daughter stated pt's daughter preferred for pt to return to [facility] at time of discharge.    CSW to continue to follow and assist with discharge planning needs.   Assessment/plan status:  Psychosocial Support/Ongoing Assessment of Needs Other assessment/ plan:   none.   Information/referral to community resources:   Pt to return to [facility] with hospice following.    PATIENT'S/FAMILY'S RESPONSE TO PLAN OF CARE: Pt's daughter understanding and agreeable to CSW plan of care. Pt's daughter expressed no further questions or concerns at this time.       Lubertha Sayres, [place] [phone]) Licensed Clinical Social Worker Orthopedics [phone]) and Surgical [phone])

## 2014-10-08 NOTE — Progress Notes (Addendum)
Spoke with pt's daughter at bedside.  She has already spoken with a rep from [employer] [facility] and wishes for her mother to return there with hospice services.  CSW, Raquel Sarna, is aware of this and working on the process. She will notify me if there is anything I need to assist with re: hospice service setup for the SNF.   Medicare IM (Important Message) delivered to patient today by me in anticipation of discharge.    Sandi Mariscal, RN BSN MHA CCM  Case Manager, Trauma Service/Unit 39M [phone]

## 2014-10-09 DIAGNOSIS — N390 Urinary tract infection, site not specified: Secondary | ICD-10-CM

## 2014-10-09 DIAGNOSIS — Z8673 Personal history of transient ischemic attack (TIA), and cerebral infarction without residual deficits: Secondary | ICD-10-CM

## 2014-10-09 MED ORDER — MORPHINE SULFATE 2 MG/ML IJ SOLN
2.0000 mg | INTRAMUSCULAR | Status: AC
  Administered 2014-10-09: 2 mg via INTRAVENOUS
  Filled 2014-10-09: qty 1

## 2014-10-09 MED ORDER — MORPHINE SULFATE 2 MG/ML IJ SOLN
2.0000 mg | INTRAMUSCULAR | Status: DC | PRN
  Administered 2014-10-09 (×2): 2 mg via INTRAVENOUS
  Filled 2014-10-09 (×2): qty 1

## 2014-10-09 NOTE — Clinical Social Work Note (Signed)
MD contacted CSW to update CSW pt has expired. CSW updated [facility] admissions liaison.  CSW signing off. Thank you.  Lubertha Sayres, [place] ([phone]) Licensed Clinical Social Worker Orthopedics [phone]) and Surgical [phone])

## 2014-10-09 NOTE — Progress Notes (Signed)
Patient expired at time of 1220pm. Time of death verified by Joaquim Lai, RN. MD Coralyn Pear notified and present. Family at bedside.

## 2014-10-09 NOTE — Discharge Summary (Addendum)
Death Summary  Regina [name] PFX:[id number] DOB: [birth year] DOA: Oct 28, 2014  PCP: Delia Chimes, NP PCP/Office notified:   Admit date: 28-Oct-2014 Date of Death: 10/31/2014  Final Diagnoses:  Principal Problem:   Sepsis Active Problems:   History of CVA (cerebrovascular accident)   Dehydration   Chronic diastolic CHF (congestive heart failure)   Dementia   Uncontrolled type 2 DM with peripheral circulatory disorder   UTI (urinary tract infection)   Altered mental status   Acute encephalopathy    1.   History of present illness:  Ms. Regina [name] is a 73yo woman with PMH of HTN, HLD, TIAs, hypothyroidism, DM2, anemia, GERD, aortic stenosis who presents with worsening lethargy and confusion. Her daughter is in the room and confirms the history. She reports that Ms. Regina [name] has been living in [facility] and that she came home on Christmas Eve and was noted to be more lethargic and sleeping a lot. She is somewhat confused and this has gotten worse. Importantly, Ms. Regina [name] has been declining in health in the past year after having TIAs in 2014. She has had multiple admissions and ED visits due to DKA and UTIs which have been recurrent. Ms. Regina [name] daughter reports that her mother has basically become bedbound and had falls when she attempts to get out of bed. They have discussed her end of life wishes and Ms. Regina [name] has told her on multiple occasions that if she were to get sick again, she would prefer to be made comfort only and to be DNR. On review, Ms. Regina [name] has been in the hospital 4 times in the past year with similar issues and to the ED twice.   When Ms. Regina [name] originally presented to the ED, she was initiated on medical therapy for presumed sepsis with hyperkalemia and tachycardia. She was given calcium gluconate, IVF, insulin and a dose of rocephin. However, upon further discussion with the family, it was decided to honor her wishes to be comfort care only.   Hospital Course:  Patient is a pleasant  73 year old female with multiple comorbidities including moderate aortic stenosis, type 2 diabetes mellitus, history recurrent urinary tract infections, having multiple previous hospitalizations, nursing home resident having a poor functional status, who was admitted to the medicine service on 10/28/14. She presented with a steep functional decline, mental status changes, lethargy. Sepsis evidenced by hypotension having blood pressure of 87/74, toxic encephalopathy with source of infection likely urinary tract. Urinalysis showing the presence of many bacteria, large leukocyte esterase, cloudy urine. She was admitted to 6N. goals of care discussed with family members who expressed desire for patient's care to be focused on comfort rather than performing further aggressive/burdensome interventions. Family members did not wish to initiate antimicrobial therapy. During this hospitalization I spoke with her regarding goals of care and agreed with focusing her care on comfort. Social work was consulted. Daughter felt that she would not be able to care for her at home for which plans were made for her to be transferred back to her skilled nursing facility where hospice services would continue to follow her. Patient did not transfer to skilled nursing facility on 10/08/2014 due to insurance issues. On the morning of 10-31-2014 patient having a steep decline, becoming unresponsive. Discharge was held given her instability. Patient expired on 2014/10/31 at 12:20 PM. I evaluated patient at bedside.. Family members at present at bedside as well.   Time: 30 minutes  Signed:  [name]  [facility] 10/31/14, 12:47 PM

## 2014-10-12 DEATH — deceased

## 2015-01-08 IMAGING — US US RENAL
1 series · 14 of 25 positions shown · non-contrast
Comparison: None.

CLINICAL DATA: Acute renal failure

EXAM:
RENAL/URINARY TRACT ULTRASOUND COMPLETE

[Series 1: us renal · 0.26mm/px · 14 of 29 slices shown]
[im 1/29]
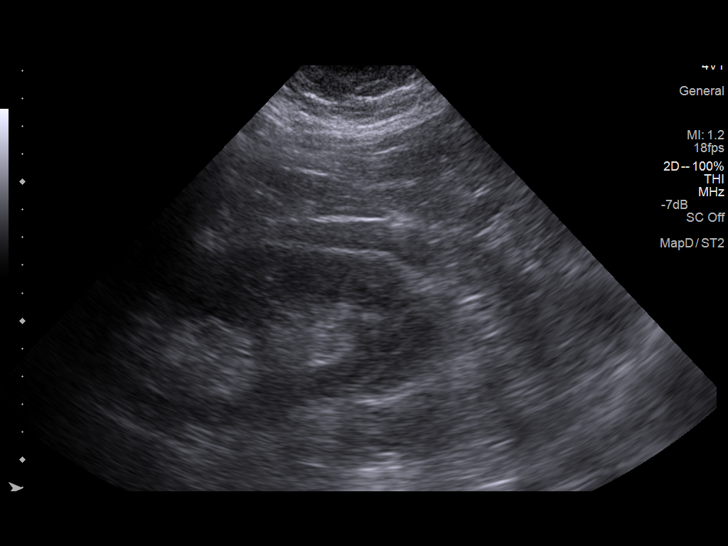
[im 3/29]
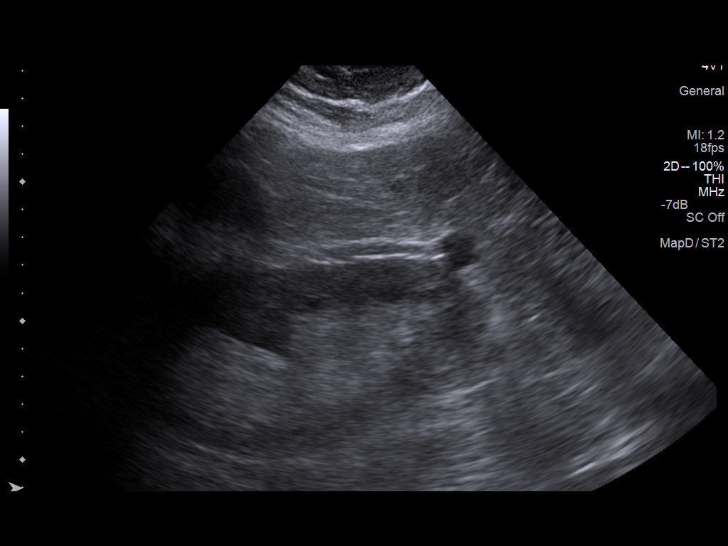
[im 5/29]
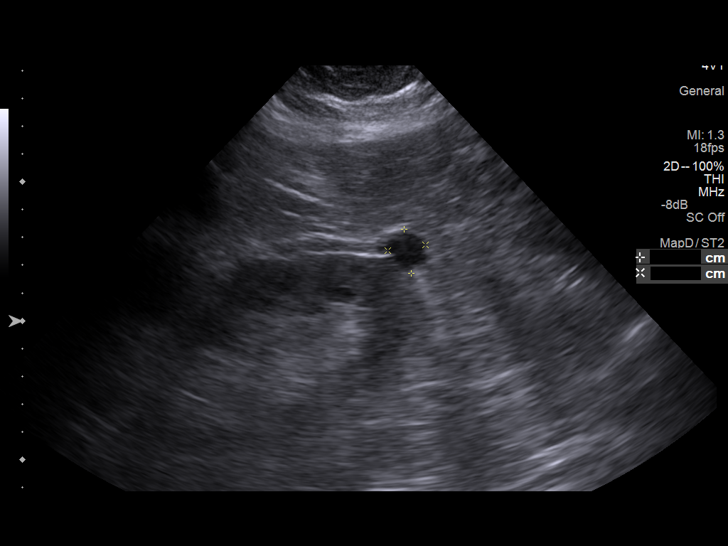
[im 8/29]
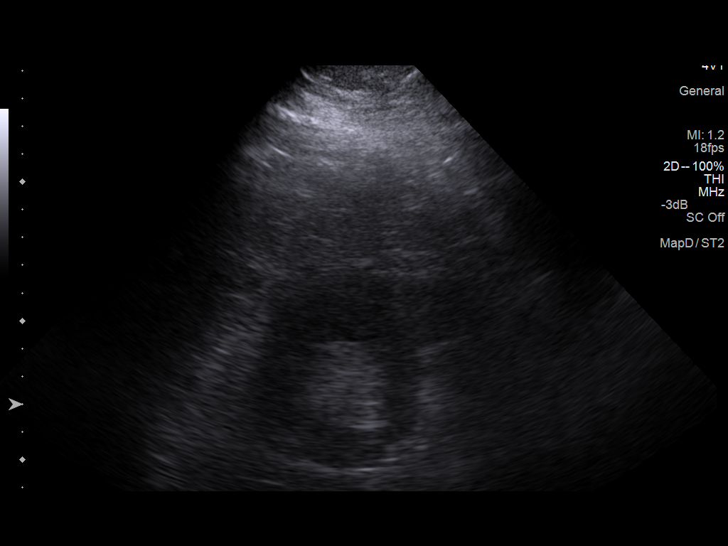
[im 10/29]
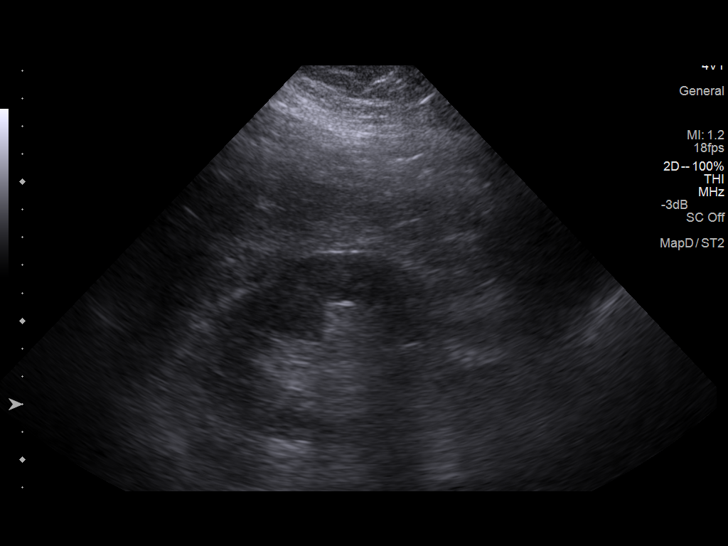
[im 11/29]
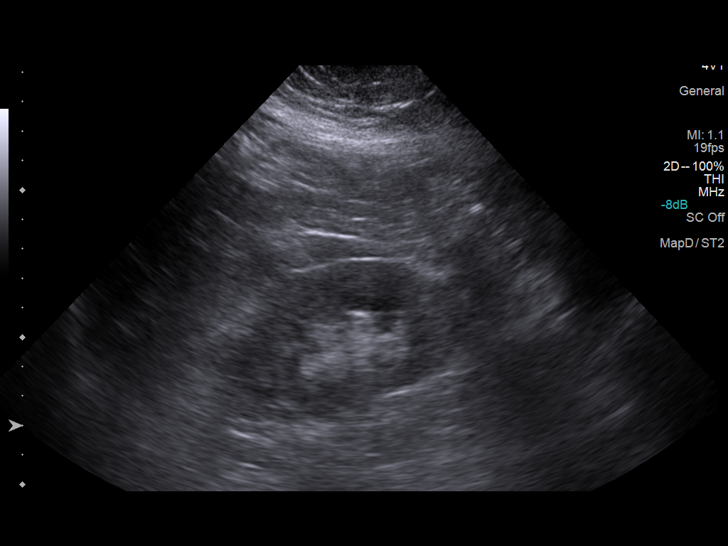
[im 13/29]
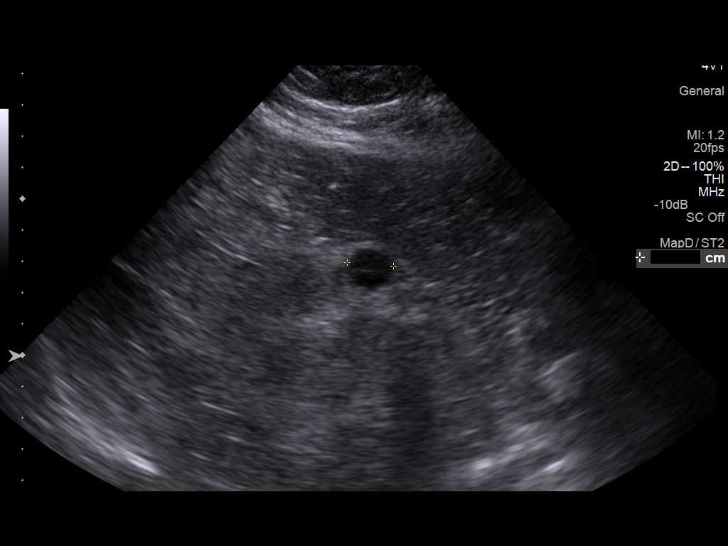
[im 16/29]
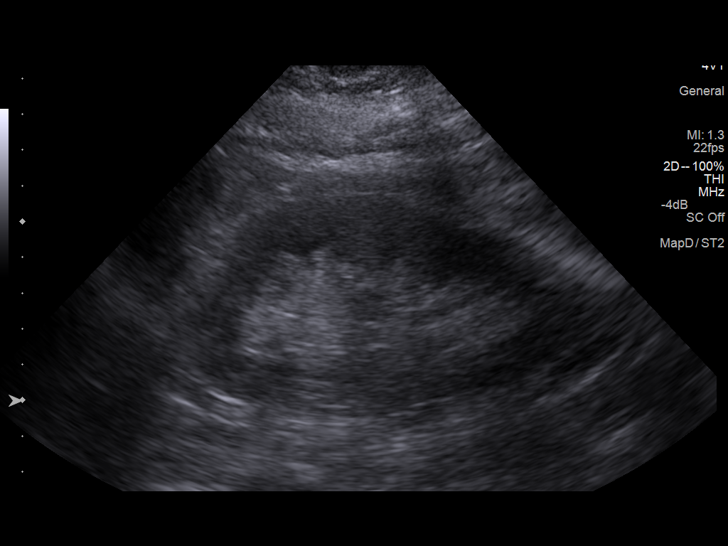
[im 18/29]
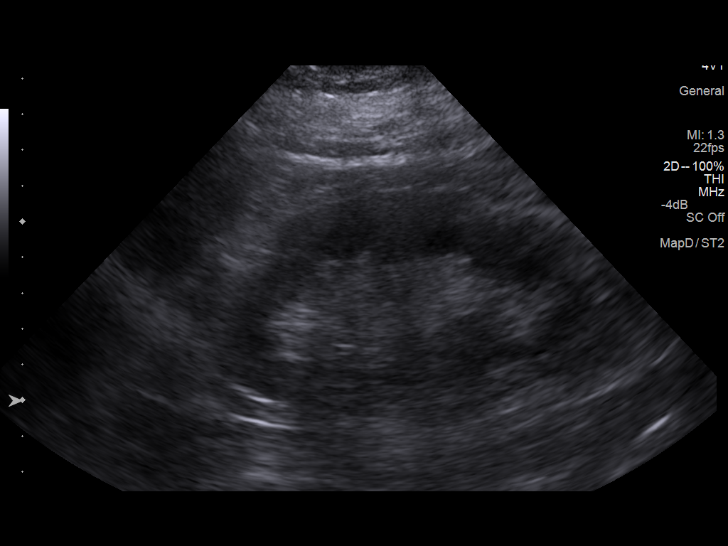
[im 19/29]
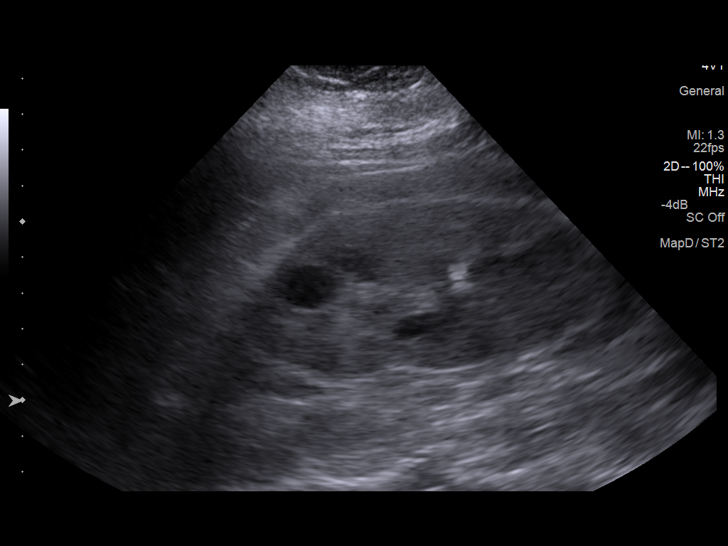
[im 22/29]
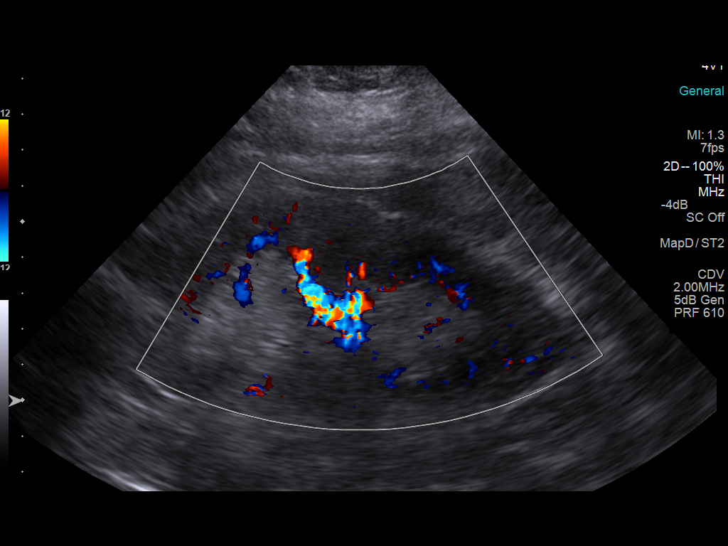
[im 24/29]
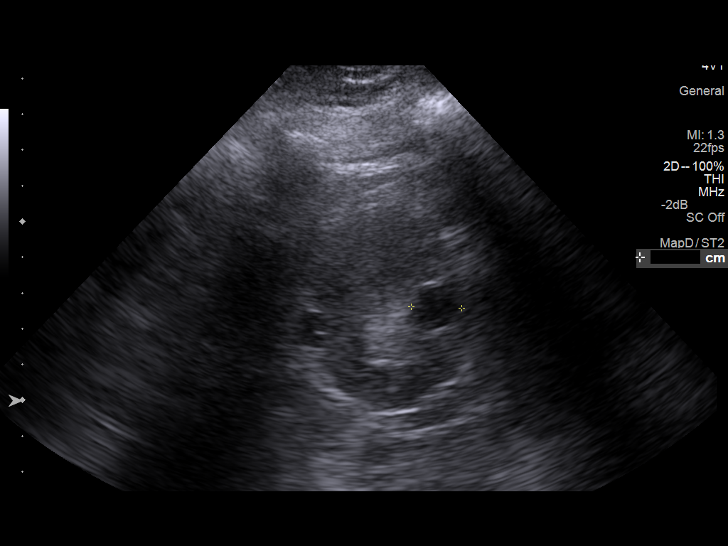
[im 26/29]
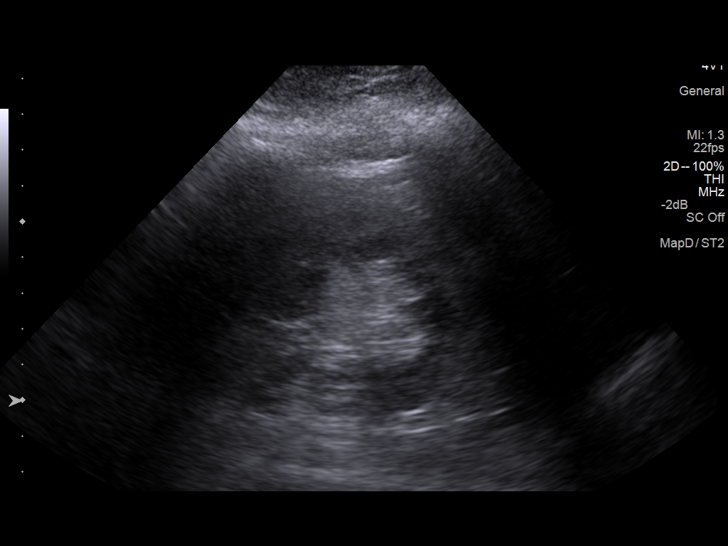
[im 29/29]
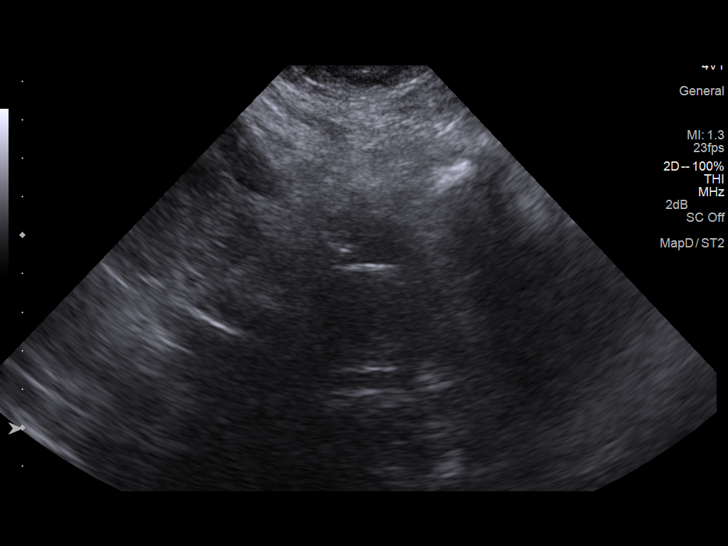

[14 of 25 positions shown; findings below may reference images not displayed]

FINDINGS: Right Kidney

Length: Measures 12.8 cm in length. Echogenicity within normal
limits. No mass or hydronephrosis visualized. Cyst within the
inferior pole measures 1.6 x 1.4 x 1.5 cm.

Left Kidney

Length: Measures 12.1 cm. Echogenicity within normal limits. No mass
or hydronephrosis visualized. Cyst within the upper pole measures
1.4 x 1.6 x 1.4 cm.

Bladder:  Collapsed around a Foley catheter.
IMPRESSION: 1. No hydronephrosis
2. Renal cysts.

## 2015-01-08 IMAGING — CR DG CHEST 1V PORT
1 series · 1 of 1 positions shown · non-contrast
Comparison: June 07, 2013

CLINICAL DATA: Shortness of breath; hypertension

EXAM:
PORTABLE CHEST - 1 VIEW

[AP]
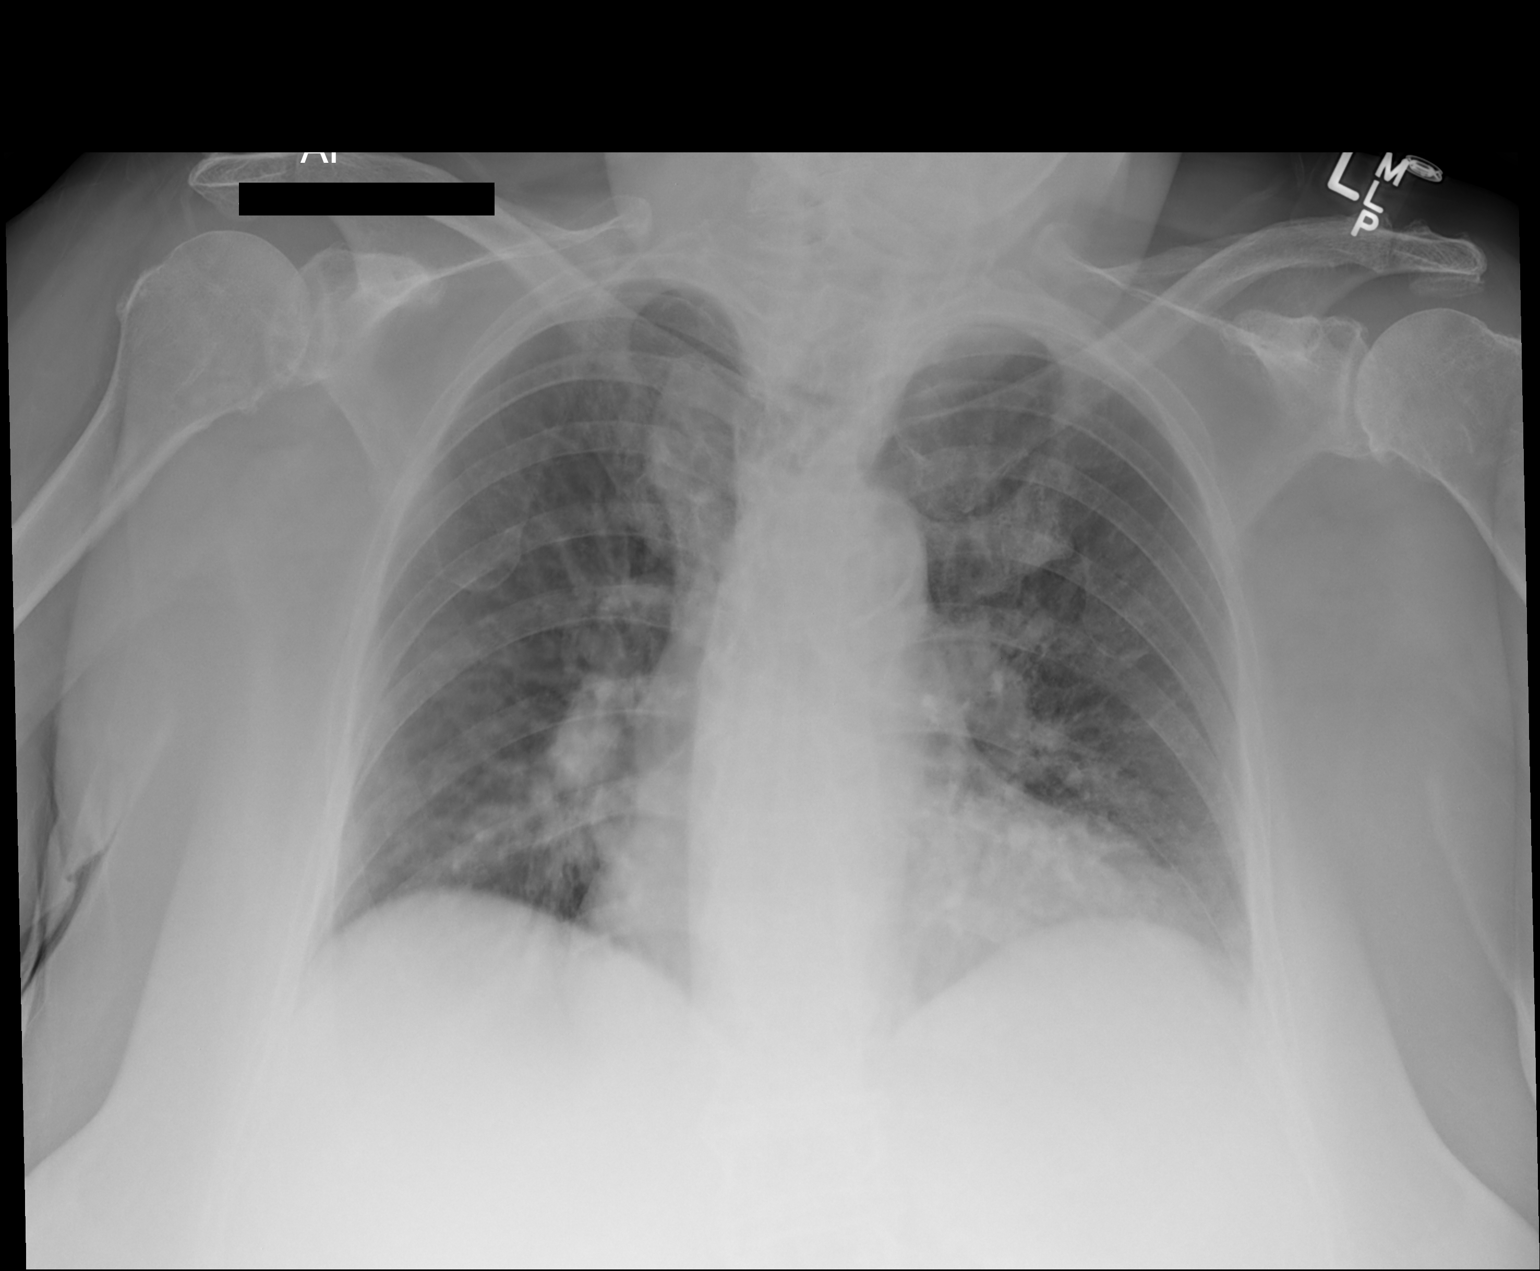

[1 of 1 positions shown; findings below may reference images not displayed]

FINDINGS: There is no edema or consolidation. Heart size and pulmonary
vascularity are normal. No adenopathy. There is degenerative change
in both shoulders.
IMPRESSION: No edema or consolidation.

## 2015-06-29 IMAGING — CR DG ELBOW COMPLETE 3+V*R*
2 series · 2 of 2 positions shown · non-contrast
Comparison: none

[x elbow ap right (1 of 2)]
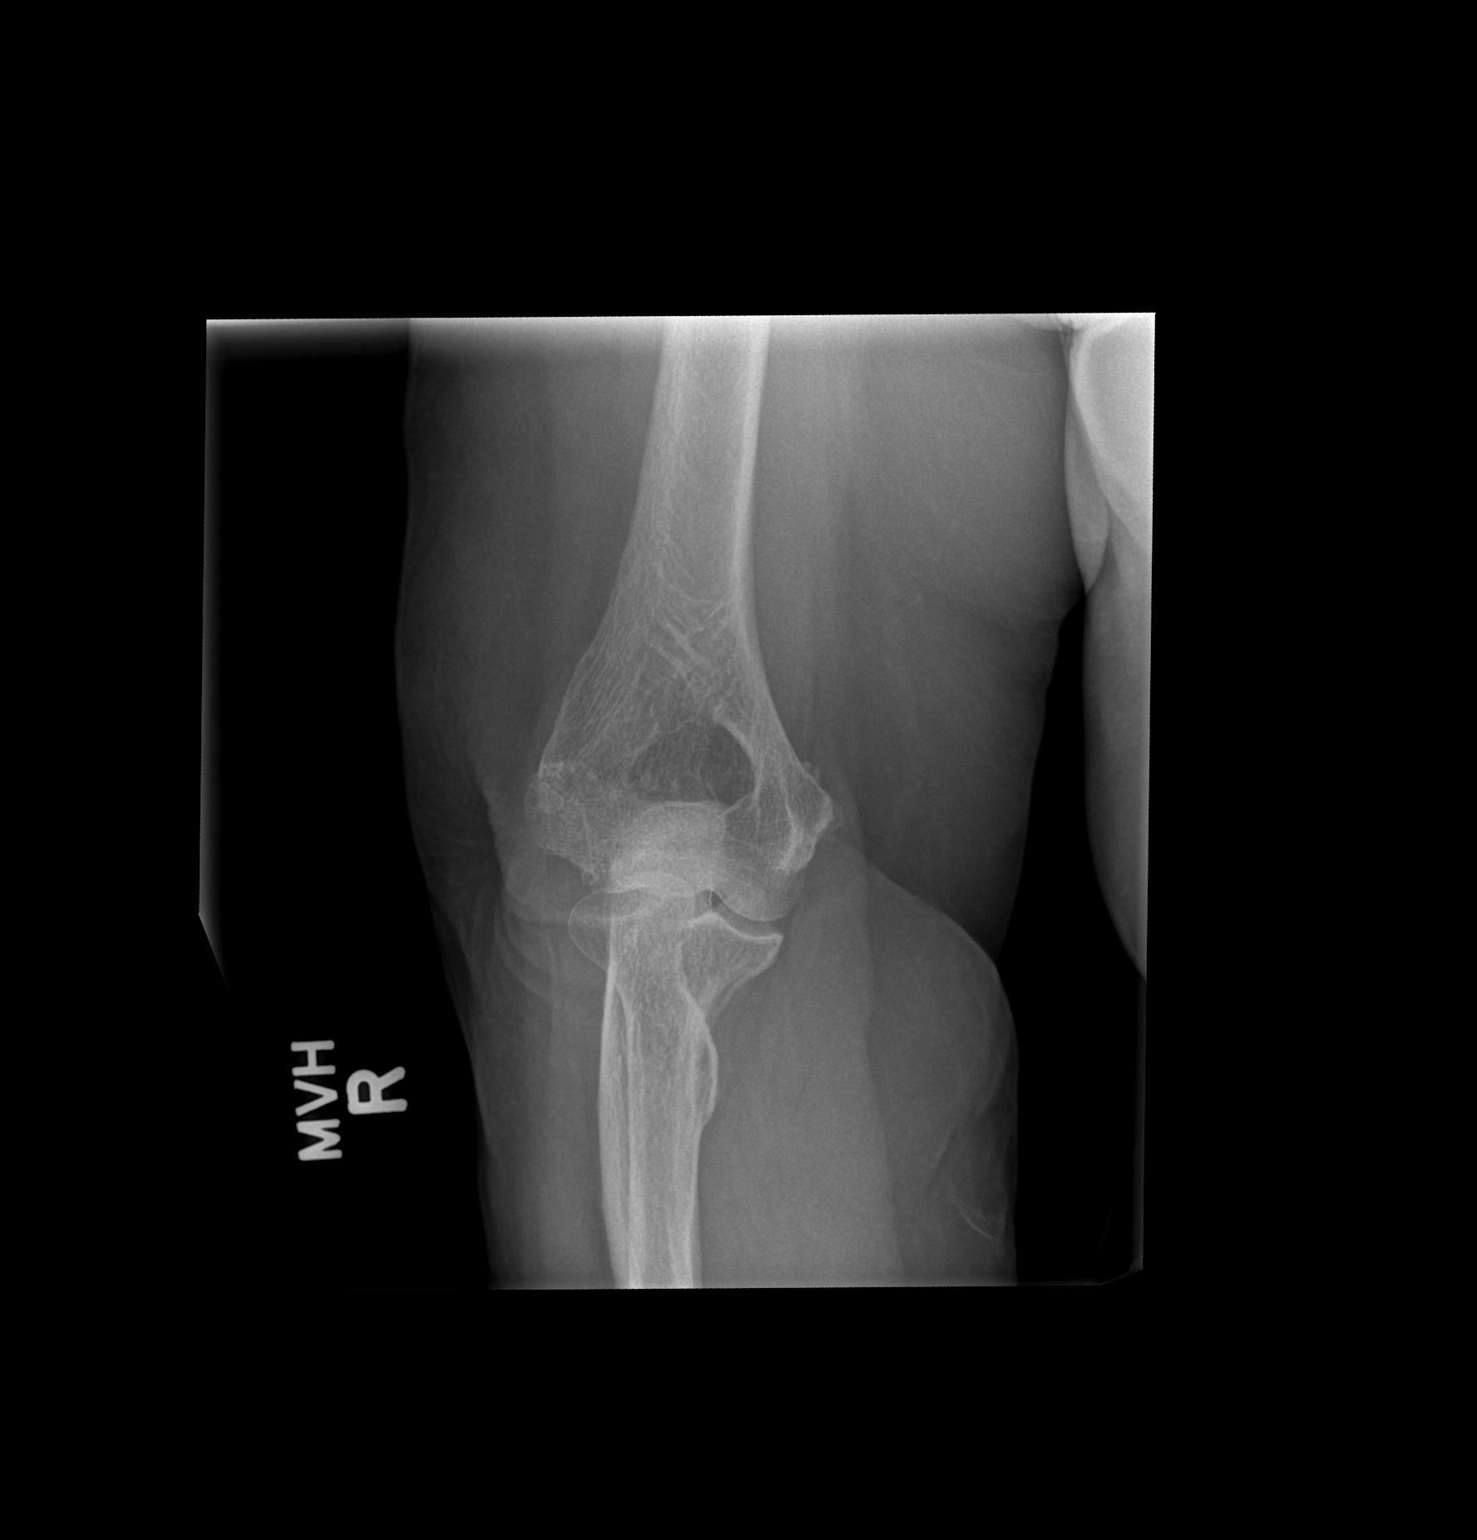

[x elbow ap right (2 of 2)]
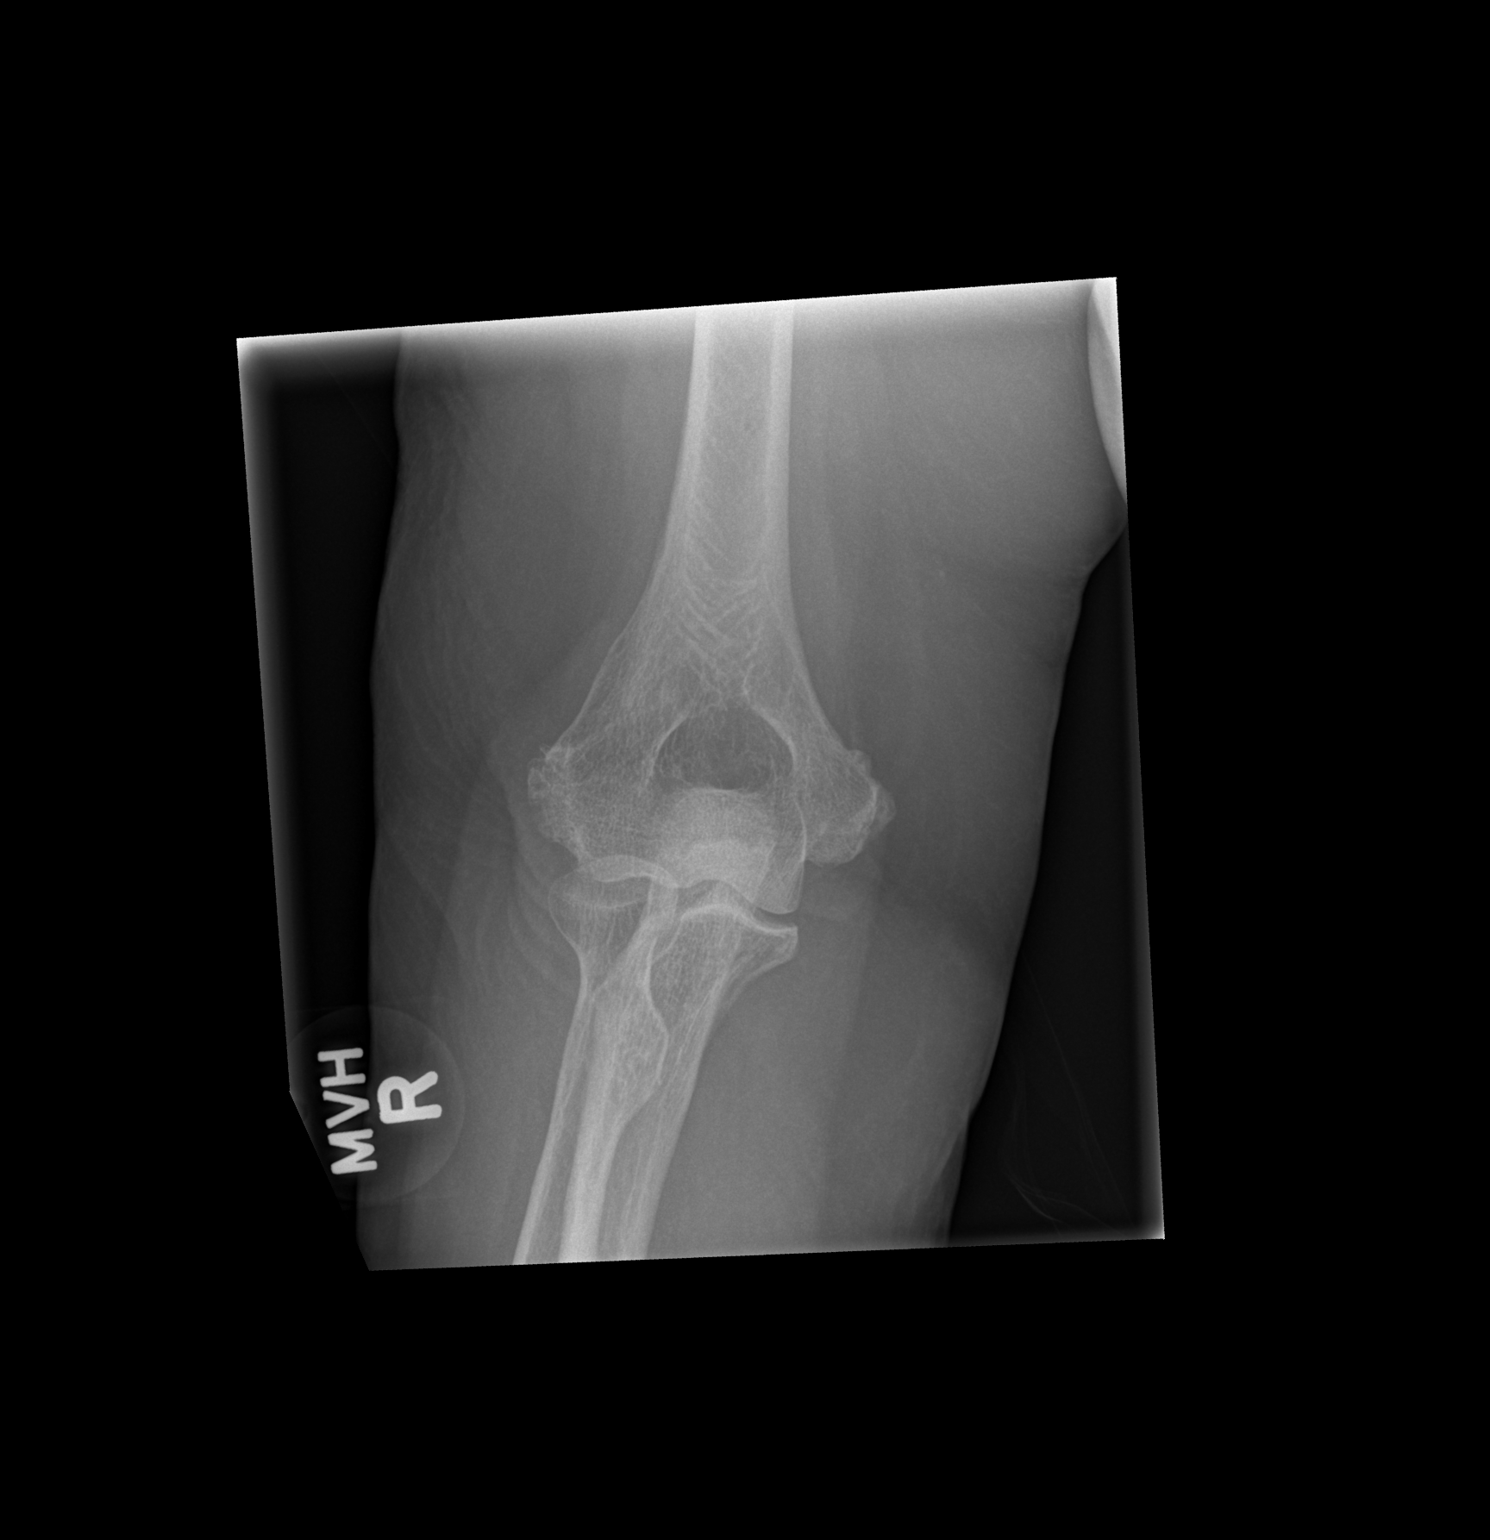

[2 of 2 positions shown; findings below may reference images not displayed]

CLINICAL DATA
Fall

EXAM
RIGHT ELBOW - COMPLETE 3+ VIEW

COMPARISON
none

FINDINGS
Limited views due to humeral neck fracture. Lateral view was not
obtained. No fracture seen on the AP and oblique view. There is soft
tissue calcification medial and lateral to the distal humerus due to
chronic tendinitis.

IMPRESSION
Limited study.  No fracture identified

SIGNATURE

## 2015-06-29 IMAGING — CR DG CHEST 1V
1 series · 1 of 1 positions shown · non-contrast
Comparison: none

[x chest ap]
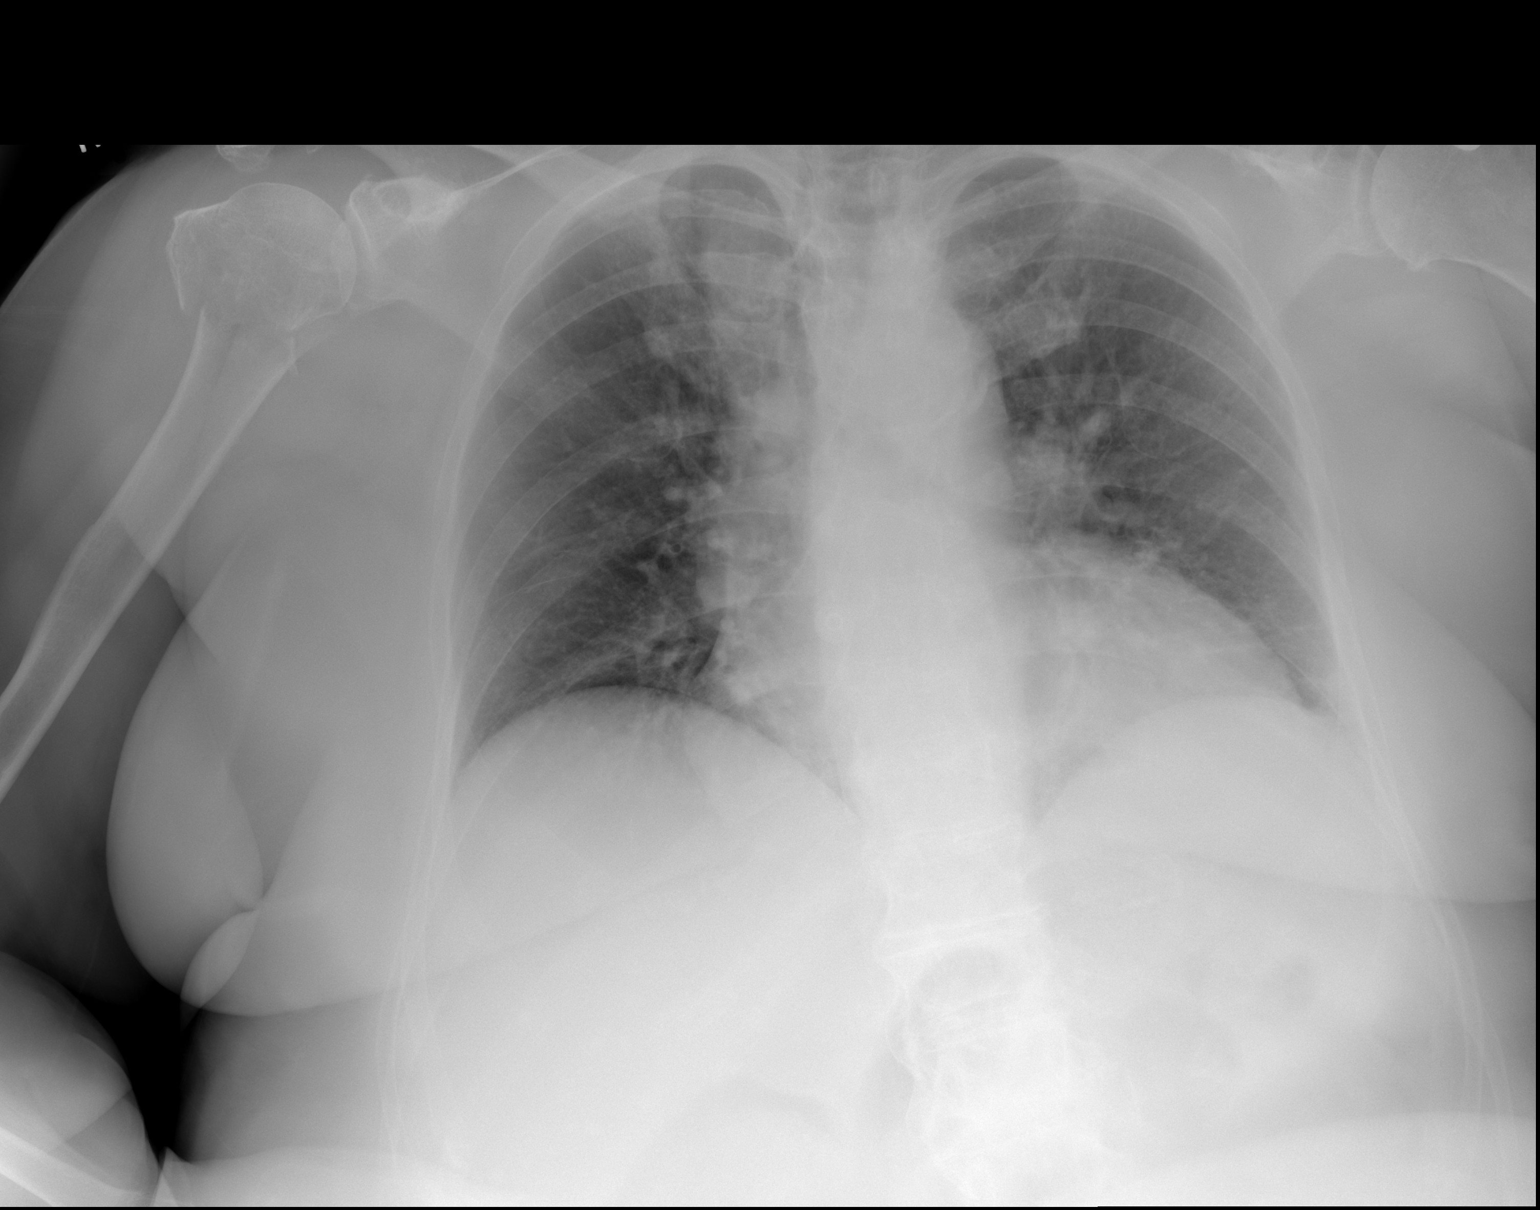

[1 of 1 positions shown; findings below may reference images not displayed]

CLINICAL DATA
Fall.  Right humeral fracture

EXAM
CHEST - 1 VIEW

COMPARISON
11/26/2013

FINDINGS
Right humeral neck fracture

Cardiac enlargement without heart failure. Mild bibasilar
atelectasis. No effusion

IMPRESSION
Mild bibasilar atelectasis.

SIGNATURE
